# Patient Record
Sex: Female | Born: 1935
Health system: Southern US, Community
[De-identification: ages and names within clinical notes are randomized; demographics above are authoritative.]

## PROBLEM LIST (undated history)

## (undated) DIAGNOSIS — M1990 Unspecified osteoarthritis, unspecified site: Secondary | ICD-10-CM

## (undated) DIAGNOSIS — K5730 Diverticulosis of large intestine without perforation or abscess without bleeding: Secondary | ICD-10-CM

## (undated) DIAGNOSIS — I2510 Atherosclerotic heart disease of native coronary artery without angina pectoris: Secondary | ICD-10-CM

## (undated) DIAGNOSIS — A048 Other specified bacterial intestinal infections: Secondary | ICD-10-CM

## (undated) DIAGNOSIS — I10 Essential (primary) hypertension: Secondary | ICD-10-CM

## (undated) DIAGNOSIS — E538 Deficiency of other specified B group vitamins: Secondary | ICD-10-CM

## (undated) DIAGNOSIS — E785 Hyperlipidemia, unspecified: Secondary | ICD-10-CM

## (undated) DIAGNOSIS — D649 Anemia, unspecified: Secondary | ICD-10-CM

## (undated) DIAGNOSIS — I679 Cerebrovascular disease, unspecified: Secondary | ICD-10-CM

## (undated) HISTORY — DX: Diverticulosis of large intestine without perforation or abscess without bleeding: K57.30

## (undated) HISTORY — DX: Unspecified osteoarthritis, unspecified site: M19.90

## (undated) HISTORY — DX: Cerebrovascular disease, unspecified: I67.9

## (undated) HISTORY — DX: Atherosclerotic heart disease of native coronary artery without angina pectoris: I25.10

## (undated) HISTORY — DX: Other specified bacterial intestinal infections: A04.8

## (undated) HISTORY — DX: Deficiency of other specified B group vitamins: E53.8

## (undated) HISTORY — DX: Anemia, unspecified: D64.9

## (undated) HISTORY — DX: Hyperlipidemia, unspecified: E78.5

## (undated) HISTORY — DX: Essential (primary) hypertension: I10

---

## 1982-05-09 HISTORY — PX: ABDOMINAL HYSTERECTOMY: SHX81

## 1997-11-26 ENCOUNTER — Ambulatory Visit: Admission: RE | Admit: 1997-11-26 | Discharge: 1997-11-26 | Payer: Self-pay | Admitting: Internal Medicine

## 1999-01-06 ENCOUNTER — Other Ambulatory Visit: Admission: RE | Admit: 1999-01-06 | Discharge: 1999-01-06 | Payer: Self-pay | Admitting: Obstetrics and Gynecology

## 1999-11-03 ENCOUNTER — Encounter: Payer: Self-pay | Admitting: Internal Medicine

## 1999-11-03 ENCOUNTER — Ambulatory Visit (HOSPITAL_COMMUNITY): Admission: RE | Admit: 1999-11-03 | Discharge: 1999-11-03 | Payer: Self-pay | Admitting: Internal Medicine

## 2000-12-06 ENCOUNTER — Encounter: Payer: Self-pay | Admitting: Internal Medicine

## 2000-12-06 ENCOUNTER — Ambulatory Visit (HOSPITAL_COMMUNITY): Admission: RE | Admit: 2000-12-06 | Discharge: 2000-12-06 | Payer: Self-pay | Admitting: Internal Medicine

## 2001-12-07 LAB — HM COLONOSCOPY

## 2001-12-19 ENCOUNTER — Ambulatory Visit (HOSPITAL_COMMUNITY): Admission: RE | Admit: 2001-12-19 | Discharge: 2001-12-19 | Payer: Self-pay | Admitting: Gastroenterology

## 2002-04-09 ENCOUNTER — Other Ambulatory Visit: Admission: RE | Admit: 2002-04-09 | Discharge: 2002-04-09 | Payer: Self-pay | Admitting: Obstetrics and Gynecology

## 2003-03-20 ENCOUNTER — Ambulatory Visit (HOSPITAL_COMMUNITY): Admission: RE | Admit: 2003-03-20 | Discharge: 2003-03-20 | Payer: Self-pay | Admitting: Internal Medicine

## 2003-12-20 ENCOUNTER — Emergency Department (HOSPITAL_COMMUNITY): Admission: EM | Admit: 2003-12-20 | Discharge: 2003-12-20 | Payer: Self-pay | Admitting: *Deleted

## 2004-03-26 ENCOUNTER — Ambulatory Visit (HOSPITAL_COMMUNITY): Admission: RE | Admit: 2004-03-26 | Discharge: 2004-03-26 | Payer: Self-pay | Admitting: Internal Medicine

## 2004-04-09 ENCOUNTER — Ambulatory Visit: Payer: Self-pay | Admitting: Internal Medicine

## 2004-08-03 ENCOUNTER — Ambulatory Visit: Payer: Self-pay | Admitting: Internal Medicine

## 2004-08-09 ENCOUNTER — Ambulatory Visit: Payer: Self-pay

## 2004-08-23 ENCOUNTER — Other Ambulatory Visit: Admission: RE | Admit: 2004-08-23 | Discharge: 2004-08-23 | Payer: Self-pay | Admitting: Obstetrics and Gynecology

## 2004-10-11 ENCOUNTER — Ambulatory Visit: Payer: Self-pay | Admitting: Internal Medicine

## 2004-12-03 ENCOUNTER — Ambulatory Visit: Payer: Self-pay | Admitting: Cardiology

## 2004-12-13 ENCOUNTER — Ambulatory Visit: Payer: Self-pay | Admitting: Cardiology

## 2004-12-16 ENCOUNTER — Inpatient Hospital Stay (HOSPITAL_BASED_OUTPATIENT_CLINIC_OR_DEPARTMENT_OTHER): Admission: RE | Admit: 2004-12-16 | Discharge: 2004-12-16 | Payer: Self-pay | Admitting: Cardiology

## 2004-12-16 ENCOUNTER — Ambulatory Visit: Payer: Self-pay | Admitting: Cardiology

## 2004-12-21 ENCOUNTER — Ambulatory Visit: Payer: Self-pay | Admitting: Internal Medicine

## 2005-01-03 ENCOUNTER — Ambulatory Visit: Payer: Self-pay | Admitting: Family Medicine

## 2005-01-14 ENCOUNTER — Ambulatory Visit: Payer: Self-pay | Admitting: Cardiology

## 2005-02-25 ENCOUNTER — Ambulatory Visit: Payer: Self-pay | Admitting: Cardiology

## 2005-03-30 ENCOUNTER — Ambulatory Visit (HOSPITAL_COMMUNITY): Admission: RE | Admit: 2005-03-30 | Discharge: 2005-03-30 | Payer: Self-pay | Admitting: Internal Medicine

## 2005-05-03 ENCOUNTER — Ambulatory Visit: Payer: Self-pay | Admitting: Internal Medicine

## 2005-05-09 HISTORY — PX: REPLACEMENT TOTAL KNEE: SUR1224

## 2005-06-06 ENCOUNTER — Ambulatory Visit: Payer: Self-pay | Admitting: Internal Medicine

## 2005-06-13 ENCOUNTER — Ambulatory Visit: Payer: Self-pay | Admitting: Internal Medicine

## 2005-09-08 ENCOUNTER — Other Ambulatory Visit: Admission: RE | Admit: 2005-09-08 | Discharge: 2005-09-08 | Payer: Self-pay | Admitting: Obstetrics and Gynecology

## 2005-09-22 ENCOUNTER — Ambulatory Visit: Payer: Self-pay | Admitting: Internal Medicine

## 2005-09-28 ENCOUNTER — Encounter: Payer: Self-pay | Admitting: Internal Medicine

## 2005-09-28 ENCOUNTER — Ambulatory Visit: Payer: Self-pay

## 2005-10-04 ENCOUNTER — Ambulatory Visit: Payer: Self-pay | Admitting: Internal Medicine

## 2005-10-10 ENCOUNTER — Inpatient Hospital Stay (HOSPITAL_COMMUNITY): Admission: RE | Admit: 2005-10-10 | Discharge: 2005-10-13 | Payer: Self-pay | Admitting: Orthopedic Surgery

## 2005-10-28 ENCOUNTER — Ambulatory Visit: Payer: Self-pay | Admitting: Internal Medicine

## 2005-11-04 ENCOUNTER — Encounter: Admission: RE | Admit: 2005-11-04 | Discharge: 2005-12-07 | Payer: Self-pay | Admitting: Orthopedic Surgery

## 2006-03-07 ENCOUNTER — Ambulatory Visit: Payer: Self-pay | Admitting: Internal Medicine

## 2006-03-07 DIAGNOSIS — M1990 Unspecified osteoarthritis, unspecified site: Secondary | ICD-10-CM

## 2006-03-07 DIAGNOSIS — I10 Essential (primary) hypertension: Secondary | ICD-10-CM | POA: Insufficient documentation

## 2006-03-07 DIAGNOSIS — K5730 Diverticulosis of large intestine without perforation or abscess without bleeding: Secondary | ICD-10-CM | POA: Insufficient documentation

## 2006-03-07 HISTORY — DX: Unspecified osteoarthritis, unspecified site: M19.90

## 2006-04-06 ENCOUNTER — Ambulatory Visit: Payer: Self-pay | Admitting: Cardiology

## 2006-04-10 ENCOUNTER — Ambulatory Visit (HOSPITAL_COMMUNITY): Admission: RE | Admit: 2006-04-10 | Discharge: 2006-04-10 | Payer: Self-pay | Admitting: Internal Medicine

## 2006-04-24 ENCOUNTER — Encounter: Admission: RE | Admit: 2006-04-24 | Discharge: 2006-04-24 | Payer: Self-pay | Admitting: Internal Medicine

## 2006-05-30 ENCOUNTER — Ambulatory Visit: Payer: Self-pay | Admitting: Internal Medicine

## 2006-07-05 ENCOUNTER — Ambulatory Visit: Payer: Self-pay | Admitting: Internal Medicine

## 2006-09-11 ENCOUNTER — Ambulatory Visit: Payer: Self-pay | Admitting: Internal Medicine

## 2006-09-11 LAB — CONVERTED CEMR LAB
ALT: 12 units/L (ref 0–40)
AST: 22 units/L (ref 0–37)
Albumin: 3.4 g/dL — ABNORMAL LOW (ref 3.5–5.2)
Alkaline Phosphatase: 40 units/L (ref 39–117)
BUN: 13 mg/dL (ref 6–23)
Basophils Absolute: 0 10*3/uL (ref 0.0–0.1)
Basophils Relative: 0.8 % (ref 0.0–1.0)
Bilirubin, Direct: 0.1 mg/dL (ref 0.0–0.3)
CO2: 32 meq/L (ref 19–32)
CRP, High Sensitivity: 4 (ref 0.00–5.00)
Calcium: 9.3 mg/dL (ref 8.4–10.5)
Chloride: 105 meq/L (ref 96–112)
Cholesterol: 203 mg/dL (ref 0–200)
Creatinine, Ser: 0.9 mg/dL (ref 0.4–1.2)
Direct LDL: 124.1 mg/dL
Eosinophils Absolute: 0.1 10*3/uL (ref 0.0–0.6)
Eosinophils Relative: 1.5 % (ref 0.0–5.0)
GFR calc Af Amer: 79 mL/min
GFR calc non Af Amer: 66 mL/min
Glucose, Bld: 91 mg/dL (ref 70–99)
HCT: 34.8 % — ABNORMAL LOW (ref 36.0–46.0)
HDL: 57.9 mg/dL (ref >39.0)
Hemoglobin: 11.9 g/dL — ABNORMAL LOW (ref 12.0–15.0)
Lymphocytes Relative: 38.1 % (ref 12.0–46.0)
MCHC: 34.1 g/dL (ref 30.0–36.0)
MCV: 90.3 fL (ref 78.0–100.0)
Monocytes Absolute: 0.6 10*3/uL (ref 0.2–0.7)
Monocytes Relative: 12 % — ABNORMAL HIGH (ref 3.0–11.0)
Neutro Abs: 2.1 10*3/uL (ref 1.4–7.7)
Neutrophils Relative %: 47.6 % (ref 43.0–77.0)
Platelets: 308 10*3/uL (ref 150–400)
Potassium: 4.1 meq/L (ref 3.5–5.1)
RBC: 3.85 M/uL — ABNORMAL LOW (ref 3.87–5.11)
RDW: 13.9 % (ref 11.5–14.6)
Sodium: 140 meq/L (ref 135–145)
TSH: 1.58 microintl units/mL (ref 0.35–5.50)
Total Bilirubin: 0.6 mg/dL (ref 0.3–1.2)
Total CHOL/HDL Ratio: 3.5
Total Protein: 6.4 g/dL (ref 6.0–8.3)
Triglycerides: 54 mg/dL (ref 0–149)
VLDL: 11 mg/dL (ref 0–40)
WBC: 4.6 10*3/uL (ref 4.5–10.5)

## 2006-09-18 ENCOUNTER — Ambulatory Visit: Payer: Self-pay | Admitting: Internal Medicine

## 2006-11-02 ENCOUNTER — Ambulatory Visit: Payer: Self-pay | Admitting: Internal Medicine

## 2006-11-06 ENCOUNTER — Ambulatory Visit: Payer: Self-pay | Admitting: Cardiology

## 2006-11-06 DIAGNOSIS — I2510 Atherosclerotic heart disease of native coronary artery without angina pectoris: Secondary | ICD-10-CM | POA: Insufficient documentation

## 2006-11-06 LAB — CONVERTED CEMR LAB
AST: 25 units/L (ref 0–37)
Albumin: 3.7 g/dL (ref 3.5–5.2)
Bilirubin, Direct: 0.1 mg/dL (ref 0.0–0.3)
Cholesterol: 195 mg/dL (ref 0–200)
HDL: 63.4 mg/dL (ref >39.0)
Total Bilirubin: 0.6 mg/dL (ref 0.3–1.2)
Total CHOL/HDL Ratio: 3.1
Triglycerides: 58 mg/dL (ref 0–149)

## 2006-12-08 ENCOUNTER — Ambulatory Visit: Payer: Self-pay | Admitting: Internal Medicine

## 2006-12-08 DIAGNOSIS — M542 Cervicalgia: Secondary | ICD-10-CM | POA: Insufficient documentation

## 2007-01-01 ENCOUNTER — Ambulatory Visit: Payer: Self-pay | Admitting: Internal Medicine

## 2007-01-02 ENCOUNTER — Telehealth: Payer: Self-pay | Admitting: Internal Medicine

## 2007-01-06 ENCOUNTER — Encounter: Admission: RE | Admit: 2007-01-06 | Discharge: 2007-01-06 | Payer: Self-pay | Admitting: Internal Medicine

## 2007-04-12 ENCOUNTER — Ambulatory Visit (HOSPITAL_COMMUNITY): Admission: RE | Admit: 2007-04-12 | Discharge: 2007-04-12 | Payer: Self-pay | Admitting: Internal Medicine

## 2007-05-01 ENCOUNTER — Ambulatory Visit: Payer: Self-pay | Admitting: Cardiology

## 2007-05-01 LAB — CONVERTED CEMR LAB
ALT: 19 U/L
AST: 24 U/L
Albumin: 3.7 g/dL
Alkaline Phosphatase: 54 U/L
BUN: 9 mg/dL
Bilirubin, Direct: 0.1 mg/dL
CO2: 34 meq/L — ABNORMAL HIGH
Calcium: 9.7 mg/dL
Chloride: 98 meq/L
Cholesterol: 221 mg/dL
Creatinine, Ser: 0.9 mg/dL
Direct LDL: 138.1 mg/dL
GFR calc Af Amer: 79 mL/min
GFR calc non Af Amer: 65 mL/min
Glucose, Bld: 87 mg/dL
HDL: 62.4 mg/dL
Potassium: 3.6 meq/L
Sodium: 138 meq/L
Total Bilirubin: 0.7 mg/dL
Total CHOL/HDL Ratio: 3.5
Total Protein: 7 g/dL
Triglycerides: 82 mg/dL
VLDL: 16 mg/dL

## 2007-05-09 ENCOUNTER — Ambulatory Visit: Payer: Self-pay

## 2007-05-10 LAB — HM PAP SMEAR: HM Pap smear: NORMAL

## 2007-10-29 ENCOUNTER — Ambulatory Visit: Payer: Self-pay | Admitting: Family Medicine

## 2007-11-06 ENCOUNTER — Ambulatory Visit: Payer: Self-pay | Admitting: Internal Medicine

## 2007-11-06 DIAGNOSIS — M79609 Pain in unspecified limb: Secondary | ICD-10-CM | POA: Insufficient documentation

## 2007-11-07 ENCOUNTER — Encounter: Payer: Self-pay | Admitting: Internal Medicine

## 2007-11-07 ENCOUNTER — Ambulatory Visit: Payer: Self-pay

## 2007-11-15 ENCOUNTER — Ambulatory Visit: Payer: Self-pay | Admitting: Internal Medicine

## 2007-11-22 ENCOUNTER — Telehealth: Payer: Self-pay | Admitting: Internal Medicine

## 2008-01-08 ENCOUNTER — Ambulatory Visit: Payer: Self-pay | Admitting: Internal Medicine

## 2008-03-23 LAB — CONVERTED CEMR LAB: LDL Cholesterol: 65 mg/dL

## 2008-03-24 ENCOUNTER — Ambulatory Visit: Payer: Self-pay | Admitting: Internal Medicine

## 2008-04-23 ENCOUNTER — Encounter: Admission: RE | Admit: 2008-04-23 | Discharge: 2008-04-23 | Payer: Self-pay | Admitting: Internal Medicine

## 2008-04-23 LAB — HM MAMMOGRAPHY

## 2008-05-09 HISTORY — PX: TOTAL HIP ARTHROPLASTY: SHX124

## 2008-05-12 ENCOUNTER — Ambulatory Visit: Payer: Self-pay | Admitting: Cardiology

## 2008-05-14 ENCOUNTER — Ambulatory Visit: Payer: Self-pay | Admitting: Cardiology

## 2008-05-14 ENCOUNTER — Encounter: Payer: Self-pay | Admitting: Internal Medicine

## 2008-05-14 ENCOUNTER — Ambulatory Visit: Payer: Self-pay

## 2008-05-14 LAB — CONVERTED CEMR LAB
Albumin: 3.7 g/dL (ref 3.5–5.2)
Bilirubin, Direct: 0.1 mg/dL (ref 0.0–0.3)
Chloride: 100 meq/L (ref 96–112)
Cholesterol: 143 mg/dL (ref 0–200)
GFR calc Af Amer: 70 mL/min
GFR calc non Af Amer: 58 mL/min
Glucose, Bld: 108 mg/dL — ABNORMAL HIGH (ref 70–99)
LDL Cholesterol: 76 mg/dL (ref 0–99)
Potassium: 5 meq/L (ref 3.5–5.1)
Sodium: 138 meq/L (ref 135–145)
Total Bilirubin: 0.8 mg/dL (ref 0.3–1.2)
Total CHOL/HDL Ratio: 2.4

## 2008-06-27 ENCOUNTER — Ambulatory Visit: Payer: Self-pay | Admitting: Internal Medicine

## 2008-06-27 DIAGNOSIS — E785 Hyperlipidemia, unspecified: Secondary | ICD-10-CM | POA: Insufficient documentation

## 2009-01-27 ENCOUNTER — Encounter: Payer: Self-pay | Admitting: Internal Medicine

## 2009-02-11 ENCOUNTER — Telehealth (INDEPENDENT_AMBULATORY_CARE_PROVIDER_SITE_OTHER): Payer: Self-pay | Admitting: *Deleted

## 2009-02-16 ENCOUNTER — Inpatient Hospital Stay (HOSPITAL_COMMUNITY): Admission: RE | Admit: 2009-02-16 | Discharge: 2009-02-19 | Payer: Self-pay | Admitting: Orthopedic Surgery

## 2009-05-26 DIAGNOSIS — R079 Chest pain, unspecified: Secondary | ICD-10-CM | POA: Insufficient documentation

## 2009-05-27 ENCOUNTER — Encounter: Payer: Self-pay | Admitting: Cardiology

## 2009-05-27 DIAGNOSIS — I739 Peripheral vascular disease, unspecified: Secondary | ICD-10-CM

## 2009-05-27 DIAGNOSIS — I779 Disorder of arteries and arterioles, unspecified: Secondary | ICD-10-CM | POA: Insufficient documentation

## 2009-05-28 ENCOUNTER — Ambulatory Visit: Payer: Self-pay

## 2009-05-28 ENCOUNTER — Ambulatory Visit: Payer: Self-pay | Admitting: Cardiology

## 2009-07-09 ENCOUNTER — Ambulatory Visit: Payer: Self-pay | Admitting: Cardiology

## 2009-07-09 ENCOUNTER — Encounter (INDEPENDENT_AMBULATORY_CARE_PROVIDER_SITE_OTHER): Payer: Self-pay | Admitting: *Deleted

## 2009-07-09 LAB — CONVERTED CEMR LAB
ALT: 13 units/L (ref 0–35)
AST: 22 units/L (ref 0–37)
Albumin: 3.6 g/dL (ref 3.5–5.2)
Alkaline Phosphatase: 54 units/L (ref 39–117)
Bilirubin, Direct: 0.1 mg/dL (ref 0.0–0.3)
Cholesterol: 166 mg/dL (ref 0–200)
HDL: 68.5 mg/dL (ref >39.00)
Total Bilirubin: 0.4 mg/dL (ref 0.3–1.2)
Total Protein: 7.3 g/dL (ref 6.0–8.3)

## 2009-09-08 ENCOUNTER — Ambulatory Visit: Payer: Self-pay | Admitting: Internal Medicine

## 2009-09-08 DIAGNOSIS — M25519 Pain in unspecified shoulder: Secondary | ICD-10-CM | POA: Insufficient documentation

## 2010-05-12 ENCOUNTER — Ambulatory Visit
Admission: RE | Admit: 2010-05-12 | Discharge: 2010-05-12 | Payer: Self-pay | Source: Home / Self Care | Attending: Cardiology | Admitting: Cardiology

## 2010-05-12 ENCOUNTER — Encounter: Payer: Self-pay | Admitting: Cardiology

## 2010-05-12 ENCOUNTER — Other Ambulatory Visit: Payer: Self-pay | Admitting: Cardiology

## 2010-05-12 LAB — HEPATIC FUNCTION PANEL
ALT: 20 U/L (ref 0–35)
AST: 32 U/L (ref 0–37)
Albumin: 3.6 g/dL (ref 3.5–5.2)
Alkaline Phosphatase: 47 U/L (ref 39–117)
Bilirubin, Direct: 0.1 mg/dL (ref 0.0–0.3)
Total Bilirubin: 0.6 mg/dL (ref 0.3–1.2)
Total Protein: 6.8 g/dL (ref 6.0–8.3)

## 2010-05-12 LAB — BASIC METABOLIC PANEL
BUN: 20 mg/dL (ref 6–23)
CO2: 32 mEq/L (ref 19–32)
Calcium: 9.5 mg/dL (ref 8.4–10.5)
Chloride: 101 mEq/L (ref 96–112)
Creatinine, Ser: 1 mg/dL (ref 0.4–1.2)
GFR: 71.96 mL/min (ref >60.00)
Glucose, Bld: 77 mg/dL (ref 70–99)
Potassium: 4 mEq/L (ref 3.5–5.1)
Sodium: 138 mEq/L (ref 135–145)

## 2010-05-12 LAB — LIPID PANEL
Cholesterol: 152 mg/dL (ref 0–200)
HDL: 55.3 mg/dL (ref >39.00)
LDL Cholesterol: 87 mg/dL (ref 0–99)
Total CHOL/HDL Ratio: 3
Triglycerides: 47 mg/dL (ref 0.0–149.0)
VLDL: 9.4 mg/dL (ref 0.0–40.0)

## 2010-05-13 ENCOUNTER — Encounter (INDEPENDENT_AMBULATORY_CARE_PROVIDER_SITE_OTHER): Payer: Self-pay | Admitting: *Deleted

## 2010-05-27 ENCOUNTER — Encounter: Payer: Self-pay | Admitting: Cardiology

## 2010-05-30 ENCOUNTER — Encounter: Payer: Self-pay | Admitting: Internal Medicine

## 2010-06-01 ENCOUNTER — Other Ambulatory Visit: Payer: Self-pay | Admitting: Obstetrics and Gynecology

## 2010-06-01 DIAGNOSIS — Z1239 Encounter for other screening for malignant neoplasm of breast: Secondary | ICD-10-CM

## 2010-06-06 LAB — CONVERTED CEMR LAB
Alkaline Phosphatase: 46 units/L (ref 39–117)
BUN: 8 mg/dL (ref 6–23)
Bilirubin, Direct: 0 mg/dL (ref 0.0–0.3)
CO2: 28 meq/L (ref 19–32)
Calcium: 9.7 mg/dL (ref 8.4–10.5)
Direct LDL: 138.7 mg/dL
Total Bilirubin: 0.7 mg/dL (ref 0.3–1.2)
Total CHOL/HDL Ratio: 3
Triglycerides: 62 mg/dL (ref 0.0–149.0)
VLDL: 12.4 mg/dL (ref 0.0–40.0)

## 2010-06-10 NOTE — Letter (Signed)
Summary: Custom - Lipid  [facility], Main Office  [address]   [place], [place] [address]   Phone: [phone]  Fax: [phone]     July 09, 2009 MRN: [id number]   BREAWNA MONTENEGRO [address] [place], [place]  [address]   Dear Ms. Berwanger,  We have reviewed your cholesterol results.  They are as follows:     Total Cholesterol:    166 (Desirable: less than 200)       HDL  Cholesterol:     68.50  (Desirable: greater than 40 for men and 50 for women)       LDL Cholesterol:       88  (Desirable: less than 100 for low risk and less than 70 for moderate to high risk)       Triglycerides:       50.0  (Desirable: less than 150)  Our recommendations include:These numbers look good. Continue on the same medicine. Liver function is normal. Take care, Dr. Darel Hong.    Call our office at the number listed above if you have any questions.  Lowering your LDL cholesterol is important, but it is only one of a large number of "risk factors" that may indicate that you are at risk for heart disease, stroke or other complications of hardening of the arteries.  Other risk factors include:   A.  Cigarette Smoking* B.  High Blood Pressure* C.  Obesity* D.   Low HDL Cholesterol (see yours above)* E.   Diabetes Mellitus (higher risk if your is uncontrolled) F.  Family history of premature heart disease G.  Previous history of stroke or cardiovascular disease    *These are risk factors YOU HAVE CONTROL OVER.  For more information, visit .  There is now evidence that lowering the TOTAL CHOLESTEROL AND LDL CHOLESTEROL can reduce the risk of heart disease.  The American Heart Association recommends the following guidelines for the treatment of elevated cholesterol:  1.  If there is now current heart disease and less than two risk factors, TOTAL CHOLESTEROL should be less than 200 and LDL CHOLESTEROL should be less than 100. 2.  If there is current heart disease  or two or more risk factors, TOTAL CHOLESTEROL should be less than 200 and LDL CHOLESTEROL should be less than 70.  A diet low in cholesterol, saturated fat, and calories is the cornerstone of treatment for elevated cholesterol.  Cessation of smoking and exercise are also important in the management of elevated cholesterol and preventing vascular disease.  Studies have shown that 30 to 60 minutes of physical activity most days can help lower blood pressure, lower cholesterol, and keep your weight at a healthy level.  Drug therapy is used when cholesterol levels do not respond to therapeutic lifestyle changes (smoking cessation, diet, and exercise) and remains unacceptably high.  If medication is started, it is important to have you levels checked periodically to evaluate the need for further treatment options.  Thank you,    [employer] Team

## 2010-06-10 NOTE — Miscellaneous (Signed)
Summary: Orders Update  Clinical Lists Changes  Orders: Added new Test order of Carotid Duplex (Carotid Duplex) - Signed 

## 2010-06-10 NOTE — Letter (Signed)
Summary: Custom - Lipid   [facility], Main Office  [address]   [place], [place] [address]   Phone: [phone]  Fax: [phone]     May 13, 2010 MRN: [id number]   Felicia Cross [address] [place], [place]  [address]   Dear Ms. Kinkade,  We have reviewed your cholesterol results.  They are as follows:     Total Cholesterol:    152 (Desirable: less than 200)       HDL  Cholesterol:     55.30  (Desirable: greater than 40 for men and 50 for women)       LDL Cholesterol:       87  (Desirable: less than 100 for low risk and less than 70 for moderate to high risk)       Triglycerides:       47.0  (Desirable: less than 150)  Our recommendations include:These numbers look good. Continue on the same medicine. Sodium, potassium, kidney and  Liver function are normal. Take care, Dr. Darel Hong.    Call our office at the number listed above if you have any questions.  Lowering your LDL cholesterol is important, but it is only one of a large number of "risk factors" that may indicate that you are at risk for heart disease, stroke or other complications of hardening of the arteries.  Other risk factors include:   A.  Cigarette Smoking* B.  High Blood Pressure* C.  Obesity* D.   Low HDL Cholesterol (see yours above)* E.   Diabetes Mellitus (higher risk if your is uncontrolled) F.  Family history of premature heart disease G.  Previous history of stroke or cardiovascular disease    *These are risk factors YOU HAVE CONTROL OVER.  For more information, visit .  There is now evidence that lowering the TOTAL CHOLESTEROL AND LDL CHOLESTEROL can reduce the risk of heart disease.  The American Heart Association recommends the following guidelines for the treatment of elevated cholesterol:  1.  If there is now current heart disease and less than two risk factors, TOTAL CHOLESTEROL should be less than 200 and LDL CHOLESTEROL should be less than 100. 2.   If there is current heart disease or two or more risk factors, TOTAL CHOLESTEROL should be less than 200 and LDL CHOLESTEROL should be less than 70.  A diet low in cholesterol, saturated fat, and calories is the cornerstone of treatment for elevated cholesterol.  Cessation of smoking and exercise are also important in the management of elevated cholesterol and preventing vascular disease.  Studies have shown that 30 to 60 minutes of physical activity most days can help lower blood pressure, lower cholesterol, and keep your weight at a healthy level.  Drug therapy is used when cholesterol levels do not respond to therapeutic lifestyle changes (smoking cessation, diet, and exercise) and remains unacceptably high.  If medication is started, it is important to have you levels checked periodically to evaluate the need for further treatment options.  Thank you,    [employer] Team

## 2010-06-10 NOTE — Assessment & Plan Note (Signed)
Summary: BRUSITIS IN RIGHT SHOULDER//SLM   Vital Signs:  Patient profile:   77 year old female Weight:      179 pounds Temp:     98.3 degrees F oral Pulse rate:   72 / minute Pulse rhythm:   regular Resp:     12 per minute BP sitting:   150 / 88  (left arm) Cuff size:   regular  Vitals Entered By: Gladis Riffle, RN (Sep 08, 2009 12:51 PM)  Procedure Note Last Tetanus: Td (05/09/2004)  Injections: Duration of symptoms: months  Procedure # 1: joint aspiration & injection    Location: right shoulder    Technique: 24 g needle    Medication: 40 mg depomedrol    Anesthesia: 1% lidocaine w/o epinephrine    Comment: verbal consent  CC: c/o bursitis right shoulder x 1 month Is Patient Diabetic? No   CC:  c/o bursitis right shoulder x 1 month.  History of Present Illness: Right shoulder pain for 1-2 months no injury OTC meds with only temporary results no swelling, no rash, no erythema even minimal activity worsens pain  All other systems reviewed and were negative   Preventive Screening-Counseling & Management  Alcohol-Tobacco     Smoking Status: quit  Current Medications (verified): 1)  Aspir-81 81 Mg Tbec (Aspirin) .... Take 1 Tablet By Mouth Once A Day 2)  Lisinopril-Hydrochlorothiazide 20-12.5 Mg Tabs (Lisinopril-Hydrochlorothiazide) .... 2 By Mouth Once Daily 3)  Premarin 0.625 Mg Tabs (Estrogens Conjugated) .... Take 1 Tablet By Mouth Once A Day 4)  Metoprolol Succinate 50 Mg Xr24h-Tab (Metoprolol Succinate) .... Take 1 Tablet By Mouth Once A Day 5)  Simvastatin 20 Mg Tabs (Simvastatin) .... Take 1 Tablet By Mouth At Bedtime 6)  Multivitamins   Tabs (Multiple Vitamin) .... Once Daily 7)  Potassium Chloride Cr 10 Meq Cr-Tabs (Potassium Chloride) .... Once Daily 8)  Ferrous Sulfate 325 (65 Fe) Mg  Tabs (Ferrous Sulfate) .Marland Kitchen.. 1 Tabpo Once Daily  Allergies: 1)  ! Codeine 2)  ! Hydrocodone  Past History:  Past Medical History: Last updated:  05/28/2009 Diverticulosis, colon Hypertension osteoarthritis, knee Coronary artery disease (Minor plaquing on previous catheterization) Hyperlipidemia Mild cerebrovascular disease  Past Surgical History: Last updated: 03/07/2006 Hysterectomy Total knee replacement left  Family History: Last updated: 05/26/2009  Brother and sister had a history of a heart attack.  Her  father had a history of hypertension.  Mom had a history of diabetes and dad  also had a history of kidney disease.  Social History: Last updated: 09/08/2009   She does have a 10 pack year history of cigarette smoking  which she quit 25-30 years ago.  She drinks about 1-2 glasses of wine a  week.  She does not use any drugs.  She is married and lives with her  husband in a two-story house.  She has four children, three daughters and  one son..    Risk Factors: Exercise: no (03/07/2006)  Risk Factors: Smoking Status: quit (09/08/2009)  Social History:   She does have a 10 pack year history of cigarette smoking  which she quit 25-30 years ago.  She drinks about 1-2 glasses of wine a  week.  She does not use any drugs.  She is married and lives with her  husband in a two-story house.  She has four children, three daughters and  one son.Marland Kitchen    Physical Exam  General:  alert and well-developed.   Head:  normocephalic and atraumatic.  Eyes:  pupils equal and pupils round.   Msk:  decreased internal rotation right shoulder strength normal   Impression & Recommendations:  Problem # 1:  SHOULDER PAIN, RIGHT (ICD-719.41) unclear etiology suspect RTC tendonitis discussed options verbal consent for injection and PT Her updated medication list for this problem includes:    Aspir-81 81 Mg Tbec (Aspirin) .Marland Kitchen... Take 1 tablet by mouth once a day  Orders: Joint Aspirate / Injection, Large (20610) Depo- Medrol 40mg  (J1030)  Complete Medication List: 1)  Aspir-81 81 Mg Tbec (Aspirin) .... Take 1 tablet by  mouth once a day 2)  Lisinopril-hydrochlorothiazide 20-12.5 Mg Tabs (Lisinopril-hydrochlorothiazide) .... 2 by mouth once daily 3)  Premarin 0.625 Mg Tabs (Estrogens conjugated) .... Take 1 tablet by mouth once a day 4)  Metoprolol Succinate 50 Mg Xr24h-tab (Metoprolol succinate) .... Take 1 tablet by mouth once a day 5)  Multivitamins Tabs (Multiple vitamin) .... Once daily 6)  Potassium Chloride Cr 10 Meq Cr-tabs (Potassium chloride) .... Once daily 7)  Ferrous Sulfate 325 (65 Fe) Mg Tabs (Ferrous sulfate) .Marland Kitchen.. 1 tabpo once daily 8)  Simvastatin 40 Mg Tabs (Simvastatin) .... Take 1 tablet by mouth at bedtime Prescriptions: LISINOPRIL-HYDROCHLOROTHIAZIDE 20-12.5 MG TABS (LISINOPRIL-HYDROCHLOROTHIAZIDE) 2 by mouth once daily  #180 x 3   Entered by:   Gladis Riffle, RN   Authorized by:   Birdie Sons MD   Signed by:   Gladis Riffle, RN on 09/09/2009   Method used:   Electronically to        [facility]* (retail)       [address]       [address] Shipping/mailing       [place], [place]  [address]       Ph: [phone]       Fax: [phone]   RxID:   [id number]

## 2010-06-10 NOTE — Assessment & Plan Note (Signed)
Summary: per check out/sf    CC:  sob.  History of Present Illness: Felicia Cross is a pleasant female who has a history of hypertension, as well as hyperlipidemia.  She had a previous catheterization in August 2006, this showed minor disease in the diagonal and distal LAD.  We also performed carotid Dopplers in Jan 2011, that showed 0-39% stenosis. F/U not deemed necessary as this had been stable over serial exams. Myoview on May 14, 2008; Ejection fraction was 66% and there was no ischemia or infarction. Since I last saw her in Jan 2011  the patient denies any dyspnea on exertion, orthopnea, PND, pedal edema, palpitations, syncope or chest pain.   Current Medications (verified): 1)  Aspir-81 81 Mg Tbec (Aspirin) .... Take 1 Tablet By Mouth Once A Day 2)  Lisinopril-Hydrochlorothiazide 20-12.5 Mg Tabs (Lisinopril-Hydrochlorothiazide) .... 2 By Mouth Once Daily 3)  Metoprolol Succinate 50 Mg Xr24h-Tab (Metoprolol Succinate) .... Take 1 Tablet By Mouth Once A Day 4)  Multivitamins   Tabs (Multiple Vitamin) .... Once Daily 5)  Potassium Chloride Cr 10 Meq Cr-Tabs (Potassium Chloride) .... Once Daily 6)  Ferrous Sulfate 325 (65 Fe) Mg  Tabs (Ferrous Sulfate) .Marland Kitchen.. 1 Tabpo Once Daily 7)  Simvastatin 40 Mg Tabs (Simvastatin) .... Take 1 Tablet By Mouth At Bedtime  Allergies: 1)  ! Codeine 2)  ! Hydrocodone  Past History:  Past Medical History: Reviewed history from 05/28/2009 and no changes required. Diverticulosis, colon Hypertension osteoarthritis, knee Coronary artery disease (Minor plaquing on previous catheterization) Hyperlipidemia Mild cerebrovascular disease  Past Surgical History: Reviewed history from 03/07/2006 and no changes required. Hysterectomy Total knee replacement left  Social History: Reviewed history from 09/08/2009 and no changes required.   She does have a 10 pack year history of cigarette smoking  which she quit 25-30 years ago.  She drinks about 1-2 glasses  of wine a  week.  She does not use any drugs.  She is married and lives with her  husband in a two-story house.  She has four children, three daughters and  one son..    Review of Systems       no fevers or chills, productive cough, hemoptysis, dysphasia, odynophagia, melena, hematochezia, dysuria, hematuria, rash, seizure activity, orthopnea, PND, pedal edema, claudication. Remaining systems are negative.   Vital Signs:  Patient profile:   77 year old female Height:      63 inches Weight:      185 pounds BMI:     32.89 Pulse rate:   75 / minute Resp:     14 per minute BP sitting:   163 / 97  (left arm)  Vitals Entered By: Kem Parkinson (May 12, 2010 9:24 AM)  Physical Exam  General:  Well-developed well-nourished in no acute distress.  Skin is warm and dry.  HEENT is normal.  Neck is supple. No thyromegaly.  Chest is clear to auscultation with normal expansion.  Cardiovascular exam is regular rate and rhythm.  Abdominal exam nontender or distended. No masses palpated. Extremities show no edema. neuro grossly intact    EKG  Procedure date:  05/12/2010  Findings:      Sinus rhythm at a rate of 75. Left axis deviation. Left ventricular hypertrophy. No ST changes.  Impression & Recommendations:  Problem # 1:  CAROTID ARTERY DISEASE (ICD-433.10) Continue aspirin and statin. Followup carotid Dopplers this month. Her updated medication list for this problem includes:    Aspir-81 81 Mg Tbec (Aspirin) .Marland Kitchen... Take  1 tablet by mouth once a day  Problem # 2:  CORONARY ARTERY DISEASE (ICD-414.00) Mild coronary disease previously. Continue aspirin, statin and beta blocker. Her updated medication list for this problem includes:    Aspir-81 81 Mg Tbec (Aspirin) .Marland Kitchen... Take 1 tablet by mouth once a day    Lisinopril-hydrochlorothiazide 20-12.5 Mg Tabs (Lisinopril-hydrochlorothiazide) .Marland Kitchen... 2 by mouth once daily    Metoprolol Succinate 50 Mg Xr24h-tab (Metoprolol succinate)  .Marland Kitchen... Take 1 tablet by mouth once a day  Her updated medication list for this problem includes:    Aspir-81 81 Mg Tbec (Aspirin) .Marland Kitchen... Take 1 tablet by mouth once a day    Lisinopril-hydrochlorothiazide 20-12.5 Mg Tabs (Lisinopril-hydrochlorothiazide) .Marland Kitchen... 2 by mouth once daily    Metoprolol Succinate 50 Mg Xr24h-tab (Metoprolol succinate) .Marland Kitchen... Take 1 tablet by mouth once a day  Problem # 3:  HYPERLIPIDEMIA (ICD-272.4) Continue statin. Check lipids and liver. Her updated medication list for this problem includes:    Simvastatin 40 Mg Tabs (Simvastatin) .Marland Kitchen... Take 1 tablet by mouth at bedtime  Orders: TLB-Lipid Panel (80061-LIPID) TLB-Hepatic/Liver Function Pnl (80076-HEPATIC)  Problem # 4:  HYPERTENSION (ICD-401.9) Her blood pressure is elevated. However she checks it  routinely and it typically is typically 130/80. She will follow this and I will increase her beta blocker as needed. Her updated medication list for this problem includes:    Aspir-81 81 Mg Tbec (Aspirin) .Marland Kitchen... Take 1 tablet by mouth once a day    Lisinopril-hydrochlorothiazide 20-12.5 Mg Tabs (Lisinopril-hydrochlorothiazide) .Marland Kitchen... 2 by mouth once daily    Metoprolol Succinate 50 Mg Xr24h-tab (Metoprolol succinate) .Marland Kitchen... Take 1 tablet by mouth once a day  Orders: TLB-BMP (Basic Metabolic Panel-BMET) (80048-METABOL)  Patient Instructions: 1)  Your physician wants you to follow-up in: ONE YEAR  You will receive a reminder letter in the mail two months in advance. If you don't receive a letter, please call our office to schedule the follow-up appointment. Prescriptions: SIMVASTATIN 40 MG TABS (SIMVASTATIN) Take 1 tablet by mouth at bedtime  #90 x 3   Entered by:   Deliah Goody, RN   Authorized by:   Ferman Hamming, MD, [facility]   Signed by:   Deliah Goody, RN on 05/12/2010   Method used:   Electronically to        [facility]  [phone]* (retail)       [address]       [place], [place]  [address]       Ph: [phone] or [phone]       Fax: [phone]   RxID:   [id number] METOPROLOL SUCCINATE 50 MG XR24H-TAB (METOPROLOL SUCCINATE) Take 1 tablet by mouth once a day  #90 x 3   Entered by:   Deliah Goody, RN   Authorized by:   Ferman Hamming, MD, [facility]   Signed by:   Deliah Goody, RN on 05/12/2010   Method used:   Electronically to        [facility]  [phone]* (retail)       [address]       [place], [place]  [address]       Ph: [phone] or [phone]       Fax: [phone]   RxID:   [id number]

## 2010-06-10 NOTE — Assessment & Plan Note (Signed)
Summary: per check out/sf  Medications Added FERROUS SULFATE 325 (65 FE) MG  TABS (FERROUS SULFATE) 1 tabpo once daily        History of Present Illness: Felicia Cross is a pleasant female who has a history of hypertension, as well as hyperlipidemia.  She had a previous catheterization in August 2006, this showed minor disease in the diagonal and distal LAD.  We also performed carotid Dopplers in Jan 2010, that showed 0-39% stenosis. F/U recommended in one year. I last saw her in January of 2010 and we scheduled a Myoview. This was in response to an episode of chest pain. It was performed on May 14, 2008. Ejection fraction was 66% and there was no ischemia or infarction. Since I last saw her the patient denies any dyspnea on exertion, orthopnea, PND, pedal edema, palpitations, syncope or chest pain.   Current Medications (verified): 1)  Aspir-81 81 Mg Tbec (Aspirin) .... Take 1 Tablet By Mouth Once A Day 2)  Lisinopril-Hydrochlorothiazide 20-12.5 Mg Tabs (Lisinopril-Hydrochlorothiazide) .... 2 By Mouth Once Daily 3)  Premarin 0.625 Mg Tabs (Estrogens Conjugated) .... Take 1 Tablet By Mouth Once A Day 4)  Metoprolol Succinate 50 Mg Xr24h-Tab (Metoprolol Succinate) .... Take 1 Tablet By Mouth Once A Day 5)  Simvastatin 40 Mg  Tabs (Simvastatin) .... One By Mouth Daily 6)  Multivitamins   Tabs (Multiple Vitamin) .... Once Daily 7)  Potassium Chloride Cr 10 Meq Cr-Tabs (Potassium Chloride) .... Once Daily 8)  Ferrous Sulfate 325 (65 Fe) Mg  Tabs (Ferrous Sulfate) .Marland Kitchen.. 1 Tabpo Once Daily  Allergies: 1)  ! Codeine 2)  ! Hydrocodone  Past History:  Family History: Last updated: 03/07/2006 Father-nephritis htn Mother--DM2 92  Past Medical History: Diverticulosis, colon Hypertension osteoarthritis, knee Coronary artery disease (Minor plaquing on previous catheterization) Hyperlipidemia Mild cerebrovascular disease  Past Surgical History: Reviewed history from 03/07/2006 and no  changes required. Hysterectomy Total knee replacement left  Social History: Reviewed history from 03/07/2006 and no changes required. Occupation: Former Smoker Regular exercise-no  Review of Systems       Mild arthralgias but no fevers or chills, productive cough, hemoptysis, dysphasia, odynophagia, melena, hematochezia, dysuria, hematuria, rash, seizure activity, orthopnea, PND, pedal edema, claudication. Remaining systems are negative.   Vital Signs:  Patient profile:   77 year old female Height:      63 inches Weight:      178 pounds BMI:     31.65 Pulse rate:   68 / minute Resp:     12 per minute BP sitting:   160 / 85  (left arm)  Vitals Entered By: Kem Parkinson (May 28, 2009 10:42 AM)  Physical Exam  General:  Well-developed well-nourished in no acute distress.  Skin is warm and dry.  HEENT is normal.  Neck is supple. No thyromegaly.  Chest is clear to auscultation with normal expansion.  Cardiovascular exam is regular rate and rhythm.  Abdominal exam nontender or distended. No masses palpated. Extremities show no edema. neuro grossly intact    Impression & Recommendations:  Problem # 1:  CAROTID ARTERY DISEASE (ICD-433.10)  Continue aspirin and statin. Followup carotid Dopplers were performed today. We will await those results. Her updated medication list for this problem includes:    Aspir-81 81 Mg Tbec (Aspirin) .Marland Kitchen... Take 1 tablet by mouth once a day  Her updated medication list for this problem includes:    Aspir-81 81 Mg Tbec (Aspirin) .Marland Kitchen... Take 1 tablet by mouth once a day  Problem # 2:  CORONARY ARTERY DISEASE (ICD-414.00)  Previous minor plaquing. No symptoms. Continue aspirin and statin. Her updated medication list for this problem includes:    Aspir-81 81 Mg Tbec (Aspirin) .Marland Kitchen... Take 1 tablet by mouth once a day    Lisinopril-hydrochlorothiazide 20-12.5 Mg Tabs (Lisinopril-hydrochlorothiazide) .Marland Kitchen... 2 by mouth once daily     Metoprolol Succinate 50 Mg Xr24h-tab (Metoprolol succinate) .Marland Kitchen... Take 1 tablet by mouth once a day  Orders: TLB-Lipid Panel (80061-LIPID) TLB-BMP (Basic Metabolic Panel-BMET) (80048-METABOL) TLB-Hepatic/Liver Function Pnl (80076-HEPATIC)  Her updated medication list for this problem includes:    Aspir-81 81 Mg Tbec (Aspirin) .Marland Kitchen... Take 1 tablet by mouth once a day    Lisinopril-hydrochlorothiazide 20-12.5 Mg Tabs (Lisinopril-hydrochlorothiazide) .Marland Kitchen... 2 by mouth once daily    Metoprolol Succinate 50 Mg Xr24h-tab (Metoprolol succinate) .Marland Kitchen... Take 1 tablet by mouth once a day  Problem # 3:  HYPERLIPIDEMIA (ICD-272.4) Continue statin. Check lipids and liver. Her updated medication list for this problem includes:    Simvastatin 40 Mg Tabs (Simvastatin) ..... One by mouth daily  Orders: TLB-Lipid Panel (80061-LIPID) TLB-BMP (Basic Metabolic Panel-BMET) (80048-METABOL)  Her updated medication list for this problem includes:    Simvastatin 40 Mg Tabs (Simvastatin) ..... One by mouth daily  Problem # 4:  HYPERTENSION (ICD-401.9) Blood pressure mildly elevated but she states her systolic typically runs in the 135-140 range. She will follow this and we will add additional medications as needed. Her updated medication list for this problem includes:    Aspir-81 81 Mg Tbec (Aspirin) .Marland Kitchen... Take 1 tablet by mouth once a day    Lisinopril-hydrochlorothiazide 20-12.5 Mg Tabs (Lisinopril-hydrochlorothiazide) .Marland Kitchen... 2 by mouth once daily    Metoprolol Succinate 50 Mg Xr24h-tab (Metoprolol succinate) .Marland Kitchen... Take 1 tablet by mouth once a day  Orders: TLB-BMP (Basic Metabolic Panel-BMET) (80048-METABOL) TLB-Hepatic/Liver Function Pnl (80076-HEPATIC)  Patient Instructions: 1)  Your physician recommends that you schedule a follow-up appointment in: 12 months 2)  Your physician recommends that you return for lab work ZO:XWRUE Lipid panel LFT, BMET

## 2010-06-10 NOTE — Miscellaneous (Signed)
Summary: Orders Update  Clinical Lists Changes  Problems: Added new problem of CAROTID ARTERY DISEASE (ICD-433.10) Orders: Added new Test order of Carotid Duplex (Carotid Duplex) - Signed 

## 2010-06-11 ENCOUNTER — Other Ambulatory Visit: Payer: Self-pay | Admitting: Obstetrics and Gynecology

## 2010-06-11 ENCOUNTER — Encounter (INDEPENDENT_AMBULATORY_CARE_PROVIDER_SITE_OTHER): Payer: MEDICARE

## 2010-06-11 ENCOUNTER — Ambulatory Visit: Admit: 2010-06-11 | Payer: Self-pay

## 2010-06-11 ENCOUNTER — Encounter: Payer: Self-pay | Admitting: Cardiology

## 2010-06-11 ENCOUNTER — Ambulatory Visit
Admission: RE | Admit: 2010-06-11 | Discharge: 2010-06-11 | Disposition: A | Discharge status: Home / Self Care | Payer: Commercial Managed Care - PPO | Source: Ambulatory Visit | Attending: Obstetrics and Gynecology | Admitting: Obstetrics and Gynecology

## 2010-06-11 DIAGNOSIS — Z1239 Encounter for other screening for malignant neoplasm of breast: Secondary | ICD-10-CM

## 2010-06-11 DIAGNOSIS — I6529 Occlusion and stenosis of unspecified carotid artery: Secondary | ICD-10-CM

## 2010-08-12 LAB — CBC
HCT: 25.4 % — ABNORMAL LOW (ref 36.0–46.0)
HCT: 36.9 % (ref 36.0–46.0)
Hemoglobin: 12.3 g/dL (ref 12.0–15.0)
Hemoglobin: 8.5 g/dL — ABNORMAL LOW (ref 12.0–15.0)
Hemoglobin: 8.9 g/dL — ABNORMAL LOW (ref 12.0–15.0)
MCHC: 33.4 g/dL (ref 30.0–36.0)
MCV: 95.8 fL (ref 78.0–100.0)
Platelets: 230 10*3/uL (ref 150–400)
RBC: 2.67 MIL/uL — ABNORMAL LOW (ref 3.87–5.11)
RBC: 2.85 MIL/uL — ABNORMAL LOW (ref 3.87–5.11)
RBC: 3.29 MIL/uL — ABNORMAL LOW (ref 3.87–5.11)
RDW: 14.4 % (ref 11.5–15.5)
WBC: 10.8 10*3/uL — ABNORMAL HIGH (ref 4.0–10.5)
WBC: 11.9 10*3/uL — ABNORMAL HIGH (ref 4.0–10.5)
WBC: 9.1 10*3/uL (ref 4.0–10.5)

## 2010-08-12 LAB — URINALYSIS, ROUTINE W REFLEX MICROSCOPIC
Glucose, UA: NEGATIVE mg/dL
Ketones, ur: 15 mg/dL — AB
Specific Gravity, Urine: 1.026 (ref 1.005–1.030)
pH: 6.5 (ref 5.0–8.0)

## 2010-08-12 LAB — BASIC METABOLIC PANEL
Calcium: 8.2 mg/dL — ABNORMAL LOW (ref 8.4–10.5)
Calcium: 9.3 mg/dL (ref 8.4–10.5)
Chloride: 93 mEq/L — ABNORMAL LOW (ref 96–112)
Creatinine, Ser: 0.84 mg/dL (ref 0.4–1.2)
GFR calc Af Amer: >60 mL/min (ref >60)
GFR calc non Af Amer: >60 mL/min (ref >60)
GFR calc non Af Amer: >60 mL/min (ref >60)

## 2010-08-12 LAB — PROTIME-INR
INR: 1.01 (ref 0.00–1.49)
INR: 1.33 (ref 0.00–1.49)
INR: 3.07 — ABNORMAL HIGH (ref 0.00–1.49)
Prothrombin Time: 13.2 seconds (ref 11.6–15.2)
Prothrombin Time: 16.4 seconds — ABNORMAL HIGH (ref 11.6–15.2)

## 2010-08-12 LAB — DIFFERENTIAL
Basophils Absolute: 0.1 10*3/uL (ref 0.0–0.1)
Basophils Relative: 1 % (ref 0–1)
Eosinophils Relative: 3 % (ref 0–5)
Monocytes Absolute: 0.5 10*3/uL (ref 0.1–1.0)

## 2010-08-12 LAB — TYPE AND SCREEN: Antibody Screen: NEGATIVE

## 2010-09-21 NOTE — Assessment & Plan Note (Signed)
[facility]                            CARDIOLOGY OFFICE NOTE   AVORY, RAHIMI                     MRN:          [id number]  DATE:05/01/2007                            DOB:          1932-09-16    Felicia Cross is a very pleasant female who has a history of hypertension  as well as hyperlipidemia.  She has had previous catheterization in  August 2006 that showed minor disease in the diagonal and distal LAD but  no other high-grade disease was noted.  Since I last saw her she is  doing well.  She has dyspnea with climbing stairs but at no other time.  There is no chest pain, palpitations or syncope.  There is no pedal  edema.   MEDICATIONS:  1. Include Premarin 0.65 mg p.o. daily.  2. Norvasc 5 mg p.o. daily.  3. Lisinopril HCT 20/25 daily.  4. Toprol 25 mg daily.  5. Aspirin 81 mg daily.  6. Zocor 40 mg p.o. daily.   PHYSICAL EXAM:  Today shows a blood pressure of 152/96 and pulse is 89.  She weighs 109 pounds.  HEENT:  Normal.  Neck is supple.  There is a left carotid bruit noted.  Her chest is clear.  CARDIOVASCULAR:  Regular rhythm.  ABDOMEN:  Shows no tenderness.  EXTREMITIES:  Show no edema.   Her electrocardiogram today shows a sinus rhythm at a rate of 87.  There  are no significant ST changes noted.   DIAGNOSIS:  1. History of minor plaquing in the left anterior descending and      diagonal - she will continue on aspirin, statin and beta blocker.  2. Hypertension - blood pressure is elevated today.  This is being      followed by Dr. Cato Mulligan.  Certainly if her Toprol or Norvasc can be      increased in the future as needed.  3. Hyperlipidemia - would check lipids liver and adjust as indicated.      Also check a BMET and follow potassium and renal function.  4. History of osteoarthritis.   She will continue with risk factor modification including diet,  exercise.  We will see her back in 1 year.  Note, we will check carotid  Dopplers given her carotid bruit.     Madolyn Frieze Jens Som, MD, [facility]  Electronically Signed    BSC/MedQ  DD: 05/01/2007  DT: 05/01/2007  Job #: [id number]

## 2010-09-21 NOTE — Assessment & Plan Note (Signed)
[facility]                            CARDIOLOGY OFFICE NOTE   MELLISSA, CONLEY                     MRN:          [id number]  DATE:05/12/2008                            DOB:          [birth year]    Ms. Bielak is a pleasant female who has a history of hypertension, as  well as hyperlipidemia.  She had a previous catheterization in August  2006, this showed minor disease in the diagonal and distal LAD.  We also  performed carotid Dopplers in December 2008, that showed 0-39% stenosis.  Since I last saw her, she denies any dyspnea, palpitations, or syncope  and there is no pedal edema.  She did state that yesterday she had an  episode of chest tightness.  Pain was substernal, but did not radiate.  It is not pleuritic or positional nor it is related to food.  I was not  exertional.  There is no associated nausea, vomiting, shortness of  breath, or diaphoresis.  It did not radiate.  It lasts for approximately  30 seconds and resolve.  She had no symptoms since then.   MEDICATIONS:  1. Premarin 0.625 mg p.o. daily.  2. Toprol 25 mg p.o. daily.  3. Aspirin 81 mg p.o. daily.  4. Zocor.  5. Lisinopril/HCT 20/25 b.i.d.  6. Vitamin C.  7. Vitamin D.  8. Potassium.   PHYSICAL EXAMINATION:  VITAL SIGNS:  Blood pressure of 138/90 and her  pulse is 70.  HEENT:  Normal.  NECK:  Supple.  CHEST:  Clear.  CARDIOVASCULAR:  Regular rate and rhythm.  ABDOMEN:  No tenderness.  EXTREMITIES:  No edema.   Electrocardiogram shows a sinus rhythm at a rate of 71.  There are no  significant ST changes noted.  There is mild left ventricular  hypertrophy.   DIAGNOSIS:  1. History of minor plaquing in the left anterior descending and      diagonal - The patient will continue on her aspirin, statin, and      beta-blocker.  2. Chest pain - Her symptoms are somewhat atypical.  We will plan to      proceed with a Myoview.  If it shows no ischemia, then we will not    pursue further cardiac workup.  3. Hypertension - Her blood pressure is mildly elevated today.  I will      ask her to increase her Toprol to 50 mg p.o. daily.  4. Hyperlipidemia - We will check lipids and liver and adjust as      indicated.  I will also check a basic metabolic panel, followup      potassium and renal function.  5. History of osteoarthritis.  6. History of mild cerebrovascular disease - She will need followup      carotid Dopplers.   I will see her back in 1 year.     Madolyn Frieze Jens Som, MD, [facility]  Electronically Signed    BSC/MedQ  DD: 05/12/2008  DT: 05/13/2008  Job #: [id number]

## 2010-09-24 NOTE — Cardiovascular Report (Signed)
NAMEAHLANI, WICKES NO.:  [id number]   MEDICAL RECORD NO.:  [id number]          PATIENT TYPE:  OIB   LOCATION:  6501                         FACILITY:  [facility]   PHYSICIAN:  Arturo Morton. Riley Kill, M.D. [facility] OF BIRTH:  14-Apr-1933   DATE OF PROCEDURE:  12/16/2004  DATE OF DISCHARGE:  12/16/2004                              CARDIAC CATHETERIZATION   INDICATIONS:  Ms. Cleland is a delightful 77 year old woman who works at  [employer]. She underwent Cardiolite study which suggested mild  abnormality. She and Dr. Andee Lineman discussed the various options for  evaluation.  Her main chief complaint is exertional dyspnea.  Ejection  fraction was now 70%, and there was possibly mild ischemia in the distal  anteroseptal wall. The current study was done to assess coronary anatomy.   PROCEDURE:  1.  Left heart catheterization.  2.  Selective coronary arteriography.  3.  Selective left ventriculography.   DESCRIPTION OF PROCEDURE:  Following informed consent and careful discussion  with the patient, the patient was brought to the catheterization laboratory  and prepped and draped in the usual fashion.  Through an anterior puncture,  the femoral artery was easily entered.  Through an anterior puncture, a 4-  French sheath was placed.  With the patient's marked elevation in blood  pressure, a 10 mg dose of intravenous labetalol was initially administered.  An additional dose was administered later in the procedure to control blood  pressure.  Views of the left and right coronary arteries were obtained in  multiple angiographic projections. Ventriculography was performed in the RAO  projection.  The patient tolerated the procedure well and there were no  complications.   HEMODYNAMIC DATA.:  1.  Central aortic pressure 215/105.  2.  Left ventricular pressure 186/12.  3.  No gradient on pullback across the aortic valve.  4.  At the completion of the procedure, the blood  pressure was running in      the range of about 160/80.   ANGIOGRAPHIC DATA:  1.  Ventriculography was performed in the RAO projection. Overall systolic      function was preserved. No segmental abnormalities or contraction were      identified.  2.  The right coronary artery is a very smooth appearing vessel. There was      no damping on engagement of the right coronary artery. There is mild      calcification in the aortic root just below the RCA takeoff. However,      the RCA itself appears to be smooth throughout, providing a posterior      descending and posterolateral branch; both of which are free of      significant disease  3.  The left main coronary artery is a large-caliber vessel and appears to      be free of significant disease.  4.  The left anterior descending artery courses to the apex.  The LAD itself      provides a diagonal branch, with perhaps minimal luminal irregularity at      its ostium; but, no significant evidence of high-grade  focal stenosis.      There is a segment at the distal portion of the left anterior descending      artery that appears to be perhaps intramyocardial, and there is perhaps      some minor irregularity at this flexed point; but, in the best RAO      cranial view there does not appear to be significant focal obstruction.  5.  The circumflex provides a major marginal branch and then courses      posteriorly as a smaller posterolateral system. The circumflex and its      branches appear to be free of critical disease.   CONCLUSIONS:  1.  Well-preserved overall left ventricular function.  2.  Minor irregularity of the first diagonal and mid distal LAD, without      evidence of high-grade focal obstruction.   RECOMMENDATIONS:  The patient will be treated medically. She will have  follow-up with Dr. Andee Lineman and Dr. Cato Mulligan.  Based on the angiographic  studies, I doubt that there is a direct correlation between the angiographic  findings in  the radionuclide images; although this could not be entirely  excluded.  Medical therapy would be the best option.       TDS/MEDQ  D:  12/16/2004  T:  12/16/2004  Job:  [id number]   cc:   Learta Codding, M.D. [facility]  [address]  [address]  [place]  [place] [address]   Valetta Mole. Swords, M.D. [facility]

## 2010-09-24 NOTE — Op Note (Signed)
NAMEMINDEL, FRISCIA NO.:  [id number]   MEDICAL RECORD NO.:  [id number]          PATIENT TYPE:  INP   LOCATION:  NA                           FACILITY:  [facility]   PHYSICIAN:  Mila Homer. Sherlean Foot, M.D. DATE OF BIRTH:  1932-12-07   DATE OF PROCEDURE:  10/10/2005  DATE OF DISCHARGE:                                 OPERATIVE REPORT   PREOPERATIVE DIAGNOSIS:  Left knee osteoarthritis.   POSTOPERATIVE DIAGNOSIS:  Left knee osteoarthritis.   PROCEDURE:  Left total knee arthroplasty.   INDICATIONS FOR PROCEDURE:  The patient is a 77 year old black female with  failure of conservative measures for osteoarthritis of the knee.  Informed  consent was obtained and medical clearance was obtained.   SURGEON:  Mila Homer. Lucey, MD.   DESCRIPTION OF PROCEDURE:  The patient was laid supine, administered general  anesthesia, and a Foley catheter placed.  The left leg was prepped and  draped in the usual sterile fashion.  The leg was then exsanguinated with  the Esmarch and the tourniquet elevated to 350 mmHg and set for one hour.  I  then used a #10 blade and made a midline incision approximately 10 cm in  length.  I then used a fresh blade to make a median parapatellar arthrotomy  and perform a synovectomy.  I then elevated the deep end of the patella off  the medial crest of the tibia.  I then everted the patella and measured 25  mm.  I reamed down to 16 mm with a 35-mm reamer and then used a 38-mm  template to drill through the lug holes and, with the prosthetic trial in  place, it recreated the 27-mm thickness.  I removed the prosthetic trial and  went into flexion.  I used the extramedullary alignment system to make a  perpendicular cut through the anatomic axis of the tibia, removing 2 mm of  bone off the medial tibial articular surface.  I removed that cut surface of  the bone and the extramedullary guide.  I then placed the intramedullary  drill into the intramedullary  canal of the femur.  I placed the  intramedullary guide on a 6 pre-valgus cut and pinned the distal femoral  cutting block and noted that it placed anteriorly.  I then made the distal  femoral cut with a sagittal saw.  I marked down at the epicondylar axis with  a marker.  I drew a __________ line and measured the posterior condylar  angle to be 3 degrees.  I sized to a size D and pinned through the 3 degree  external rotation holes.  I then made the anterior, posterior, and chamfer  cuts through the four-in-one cutting block.  I removed the guide.  I then  finished the femur with a size D finishing block, finished the tibia with a  size 5 tibial tray drill and keel.  I then trialed with the 5 tibia, D  femur, 12 insert, 3-A patella, had excellent flexion and extension gap,  balance good, excellent patella tracking.  I removed the trial components  and copiously  irrigated.  I then cemented in the components, removing excess  cement, snapping in the __________ lane.  I cemented in the patella and  removed the extra cement.  I then placed a Hemovac coming out  superolaterally and deepened the arthrotomy, a pain catheter coming out  superomedially and superficial to the arthrotomy.  I closed the arthrotomy  with interrupted #1 proximal.  I then let the tourniquet down when the  cement was hardened.  I obtained hemostasis and irrigated again, and then  closed the rest of the arthrotomy with figure-of-eight #1 Vicryl sutures,  the deep soft tissues with interrupted #0 Vicryl sutures, a subcuticular #2-  0 Vicryl stitch, and skin staples.   COMPLICATIONS:  None.   DRAINS:  One Hemovac, one pain catheter.   ESTIMATED BLOOD LOSS:  300 ml.   DRESSING:  Xeroform dressing with sponges, sterile Webril, and a TED  stocking.           ______________________________  Mila Homer. Sherlean Foot, M.D.     SDL/MEDQ  D:  10/10/2005  T:  10/10/2005  Job:  [id number]

## 2010-09-24 NOTE — H&P (Signed)
Felicia Cross, BREWTON NO.:  [id number]   MEDICAL RECORD NO.:  [id number]          PATIENT TYPE:  INP   LOCATION:  NA                           FACILITY:  [facility]   PHYSICIAN:  Mila Homer. Sherlean Foot, M.D. DATE OF BIRTH:  1933/04/10   DATE OF ADMISSION:  10/10/2005  DATE OF DISCHARGE:                                HISTORY & PHYSICAL   CHIEF COMPLAINT:  Left knee pain since March 2006.   HISTORY OF PRESENT ILLNESS:  This 77 year old black female patient presented  to Dr. Sherlean Foot with a 1-year history of gradual onset progressively worsening  left knee pain.  She has had no known injury or prior surgery to her knee  but the knee pain seems to be getting worse.  At this point, it is described  as a constant sharp, dull sensation diffuse about the joint without  radiation.  It increases with any prolonged walking and then decreases with  sitting and rest.  She has currently been taking some diclofenac with  minimal relief.  The knee does grind and swell and occasionally awakens her  at night.  There is no popping, catching, locking or giving way.  She has  received cortisone injections in the knees with minimal relief.  She does  not ambulate with any assistive devices.   ALLERGIES:  CODEINE causes severe nausea and vomiting.   CURRENT MEDICATIONS:  1.  Diclofenac 75 mg 1 tablet p.o. b.i.d. last dose Sep 29, 2005.  2.  Aspirin 81 mg 1 tablet p.o. q.a.m. last dose some time the week of the      14th.  3.  Premarin 6.25 mg 1 tablet p.o. q.a.m.  4.  Toprol 25 mg 1 tablet p.o. q.a.m.  5.  Norvasc 5 mg 1 tablet p.o. q.a.m.  6.  Hydrochlorothiazide 25 mg 1 tablet p.o. q.a.m.   PAST MEDICAL HISTORY:  Hypertension. She denies any history of diabetes  mellitus, thyroid disease, hiatal hernia, peptic ulcer disease, reflux,  heart disease, asthma or any other chronic medical condition other than  noted previously.   PAST SURGICAL HISTORY:  1.  Right breast lumpectomy in 1969.  2.  Hysterectomy in 1989.   She denies any complications from the above-mentioned procedures.   SOCIAL HISTORY:  She does have a 10 pack year history of cigarette smoking  which she quit 25-30 years ago.  She drinks about 1-2 glasses of wine a  week.  She does not use any drugs.  She is married and lives with her  husband in a two-story house.  She has four children, three daughters and  one son.  She currently works as a Herbalist.  Her medical doctor  is Dr. Birdie Sons and her cardiologist is Dr. Lewayne Bunting, both at [facility].   FAMILY HISTORY:  Brother and sister had a history of a heart attack.  Her  father had a history of hypertension.  Mom had a history of diabetes and dad  also had a history of kidney disease.   REVIEW OF SYSTEMS:  She does wear glasses.  She has the  high blood pressure.  She does complain of occasional ankle swelling.  All other systems were  negative and noncontributory.   PHYSICAL EXAM:  GENERAL:  Well-developed, well-nourished, mildly overweight  black female in no acute distress.  Talks easily with examiner.  Height 5  feet 4 inches, weight 176 pounds, BMI is 29.  Accompanied by her husband and  daughter.  VITAL SIGNS:  Temperature 98.2 degrees Fahrenheit, pulse 76, respirations  69, and BP 158/82.  HEENT:  Normocephalic, atraumatic without frontal or maxillary sinus  tenderness to palpation.  Conjunctiva pink.  Sclerae anicteric.  PERRLA.  EOMs intact.  No visible external ear deformities.  Hearing grossly intact.  A large amount of cerumen in both ear canals and cannot visualize the  tympanic membranes.  Nose and nasal septum midline.  Nasal mucosa pink and  moist without exudates or polyps noted.  Buccal mucosa pink and moist.  Dentition in good repair.  Pharynx without erythema or exudates.  Tongue and  uvula midline.  Tongue without fasciculations and uvula rises equally with  phonation.  NECK:  No visible masses or lesions noted.  Trachea  midline.  No palpable  lymphadenopathy nor thyromegaly.  Carotids +2 bilaterally without bruits.  Full range of motion, nontender to palpation along the cervical spine.  CARDIOVASCULAR:  Heart rate and rhythm regular.  S1-S2 present without rubs,  clicks or murmurs noted.  RESPIRATORY:  Respirations even and unlabored.  Breath sounds clear to  auscultation bilaterally without rales or wheezes noted.  ABDOMEN:  A rounded abdominal contour.  Bowel sounds present x4 quadrants.  Soft, nontender to palpation without hepatosplenomegaly nor CVA tenderness.  Femoral pulses +2 bilaterally.  Nontender to palpation along the vertebral  column.  BREASTS/GU/RECTAL/PELVIC:  These exams deferred at this time.  MUSCULOSKELETAL:  No obvious deformities bilateral upper extremities with  full range of motion of these extremities without pain.  Radial pulses +2  bilaterally.  She has full range of motion of her hips, ankles and toes  bilaterally.  DP and PT pulses are +2.  Mild lower extremity nonpitting  edema.  No calf pain with palpation.  Negative Homans' sign bilaterally.   Right knee has full extension and flexion to 120 degrees with minimal  crepitus.  There is maybe a small +1 effusion in the knee but no pain with  palpation along the joint line.  She is stable to varus and valgus stress.  Negative anterior drawer.  The left knee has full extension and flexion only  to 90 degrees but any range of motion causes pain.  There is no crepitus or  range of motion.  She is acutely tender to palpation over the medial joint  line, none laterally.  Small effusion.  Stable to varus and valgus stress.  Negative anterior drawer.   NEUROLOGIC:  Alert and oriented x3.  Cranial nerves II-XII are grossly  intact.  Strength 5/5 bilateral upper and lower extremities.  Rapid  alternating movements intact.  Deep tendon reflexes 2+ bilateral upper and lower extremities.   RADIOLOGIC FINDINGS:  X-rays taken of her left  knee in March 2007 show varus  deformity with complete bone on bone contact of osteoarthritis in that knee  and significant osteoarthritis in the right knee as well.   IMPRESSION:  1.  Bilateral osteoarthritis of the knees, left worse than right.  2.  Hypertension.   PLAN:  Ms. Karpowicz will be admitted to [facility] on October 10, 2005  where  she will undergo a left total knee arthroplasty by Dr. Mila Homer.  Lucey.  She will undergo all routine preoperative laboratory tests and  studies prior to this procedure.  If we have any medical issues while she is  hospitalized we will consult [place] hospitalists.      Legrand Pitts Duffy, P.A.    ______________________________  Mila Homer. Sherlean Foot, M.D.    KED/MEDQ  D:  09/29/2005  T:  09/29/2005  Job:  [id number]

## 2010-09-24 NOTE — Assessment & Plan Note (Signed)
[facility]                            CARDIOLOGY OFFICE NOTE   SUZI, HERNAN                     MRN:          [id number]  DATE:04/06/2006                            DOB:          [birth year]    Ms. Vahey is a very pleasant female who has a history of hypertension.  She underwent a stress test in April 2006 secondary to chest pain and  dyspnea.  She had very mild ischemia of the distal anteroseptal wall  with an ejection fraction of 70%.  Because of the abnormal nuclear  study, she underwent cardiac catheterization on December 16, 2004.  Her  ejection fraction was normal.  She had minor disease in the diagonal and  distal LAD, but there was no other high-grade focal obstruction noted.  She has been treated medically.  She also had a nuclear study repeated  in May secondary to preop evaluation prior to knee surgery.  There was  apical thinning, but the perfusion was otherwise normal with no  ischemia.  Ejection fraction was 71%.  Since then, she denies any  significant dyspnea with routine activities.  She can have some with  more extreme activities.  There is no orthopnea, PND, edema,  palpitations, presyncope, syncope or chest pain.   MEDICATIONS:  1. Premarin 0.625 mg p.o. daily.  2. Norvasc 5 mg p.o. daily.  3. Lisinopril HCT 20/25 mg p.o. daily.  4. Toprol 25 mg p.o. daily.  5. Aspirin 81 p.o. daily.   PHYSICAL EXAMINATION:  Shows a blood pressure of 128/76.  Her pulse is  81.  She weighs 173 pounds.  NECK:  Supple with no bruits.  CHEST:  Clear.  CARDIOVASCULAR:  Shows a regular rate and rhythm.  ABDOMEN:  Shows no pulsatile masses.  No bruits.  EXTREMITIES:  Show no edema.  Her electrocardiogram shows a sinus rhythm with occasional PVCs.  There  are no ST changes noted.   DIAGNOSES:  1. History of minor plaquing in the left anterior descending and      diagonal.  2. Hypertension.  3. History osteoarthritis status post knee  replacement.   PLAN:  Ms. Bartolotta is doing well from a symptomatic standpoint.  Her  recent nuclear study in May showed no ischemia and normal LV function.  Her blood pressure is well controlled.  We will continue with her  present medications.  I will check a BMET today to follow up on her  potassium and renal function, given her diuretic use.  We will also  check her cholesterol today.  We will treat if indicated.  I will see  her back in 1 year.     Madolyn Frieze Jens Som, MD, [facility]  Electronically Signed    BSC/MedQ  DD: 04/06/2006  DT: 04/07/2006  Job #: [id number]

## 2010-09-24 NOTE — Discharge Summary (Signed)
Felicia, Cross NO.:  [id number]   MEDICAL RECORD NO.:  [id number]          PATIENT TYPE:  INP   LOCATION:  5032                         FACILITY:  [facility]   PHYSICIAN:  Mila Homer. Sherlean Foot, M.D. DATE OF BIRTH:  December 30, 1932   DATE OF ADMISSION:  10/10/2005  DATE OF DISCHARGE:  10/13/2005                                 DISCHARGE SUMMARY   ADMISSION DIAGNOSIS:  Bilateral osteoarthritis of the knees, left greater  than right.   DISCHARGE DIAGNOSES:  1.  Bilateral osteoarthritis of the knees, left greater than right.  2.  Hypertension.  3.  Post hemorrhagic anemia.  4.  Hypokalemia.   PROCEDURE:  Left total knee arthroplasty.   HISTORY OF PRESENT ILLNESS:  This 21 year old black female presents with a 1  year history of gradual onset of progressively worsening left knee pain.  She has had no known injury or prior surgery to her knee, but the knee seems  to be getting worse.  At this point it is described as constant, sharp, dull  sensation, diffuse about the joint without radiation.  It increases with any  prolonged walking and then decreases with sitting and rest.  She has  currently been taking some Diclofenac with minimal relief.  The knee does  grind and swell, and occasionally wakes her at night.  There is no popping,  catching, locking or giving way.  She has received corticosteroid injections  in the knee with minimal relief.  She does not ambulate with assist devices.  She is now indicated for a total knee arthroplasty.   HOSPITAL COURSE:  A 77 year old female admitted 10/10/2005 after appropriate  laboratory studies were obtained as well as 1 g Ancef IV on-call in the  operating room.  She was taken to the operating room where she underwent a  left total knee arthroplasty.  She patient tolerated the procedure well.  She was placed on a low dose Dilaudid PCA pump postoperatively.  Foley was  placed intraoperatively.  CMP was placed from 0-90 degrees  for 6-8 hours a  day, increasing daily by 10 degrees.  Consultation with PT, OT and care  management was placed.  She was allowed weightbearing as tolerated.  She was  allowed out of bed to a chair the following daily.  Pulmonary toiletry was  ordered.  Incentive spirometry was encouraged.  PCA was weaned off.  She did  have hypokalemia and this was treated with K-Dur 40 mg p.o. daily starting  on 10/12/2005.  Lovenox teaching was begun.  Her saline lock was  discontinued on the 7th.  All lines were discontinued on the 7th and she was  discharged to home in improved condition to return back to followup with Dr.  Sherlean Foot.   LABORATORY STUDIES:  Admitted with a hemoglobin of 12.8, hematocrit 38.1%,  white count 6300, platelets 335,000.  Discharge hemoglobin 8.8, hematocrit  25.9, white count 11,000, platelets 249,000.  Preoperative chemistries  sodium 135, potassium 4.3, chloride 99, CO2 29, glucose 119, BUN 11,  creatinine 1.1, calcium 10.0, total protein 6.8, albumin 3.8, AST 25, ALT  20,  ALP 46, total bilirubin 0.6.  Her potassium dropped to 2.8, and on the  7th was sodium 136, potassium 3.0, chloride 97, CO2 31, glucose 126, BUN 3,  creatinine 0.9 and calcium 8.5.  Urinalysis benign for a voided urine.  Blood type was A positive.  Antibody screen negative.   DISCHARGE INSTRUCTIONS:  No restrictions in her diet.  Walk with her  crutches.  Weightbearing as tolerated.  May shower after 2 days of no  drainage.  Keep her wound clean and dry, and change dressings daily.  Call  the office if a temperature greater than 101.5 or if there is foul smelling  drainage or any signs of infection.  Resume her home medications.  Lovenox  40 mg injections daily for 12 days.  Percocet 5/325 one to two tablets every  4 hours as needed for pain.  No aspirin while on Lovenox.  She will followup  with Dr. Sherlean Foot two weeks postoperative.  CMP 0-90 degrees, increasing by 10  degrees a day.  She is to use it for  6-8 hours.  Home health to draw a  potassium and call the results.  She is discharged in improved condition.      Oris Drone Petrarca, P.A.-C.    ______________________________  Mila Homer. Sherlean Foot, M.D.    BDP/MEDQ  D:  11/25/2005  T:  11/25/2005  Job:  [id number]

## 2010-11-02 ENCOUNTER — Encounter: Payer: Self-pay | Admitting: Internal Medicine

## 2010-11-02 ENCOUNTER — Ambulatory Visit (INDEPENDENT_AMBULATORY_CARE_PROVIDER_SITE_OTHER): Payer: Medicare Other | Admitting: Internal Medicine

## 2010-11-02 VITALS — BP 134/84 | Temp 98.0°F | Ht 64.0 in | Wt 181.0 lb

## 2010-11-02 DIAGNOSIS — M25519 Pain in unspecified shoulder: Secondary | ICD-10-CM

## 2010-11-02 MED ORDER — MELOXICAM 7.5 MG PO TABS: 7.5000 mg | ORAL_TABLET | Freq: Every day | ORAL | Status: DC

## 2010-11-02 MED ORDER — MELOXICAM 7.5 MG PO TABS: 15.0000 mg | ORAL_TABLET | Freq: Every day | ORAL | Status: DC

## 2010-11-02 MED ORDER — HYDROCHLOROTHIAZIDE 25 MG PO TABS: 50.0000 mg | ORAL_TABLET | Freq: Every day | ORAL | Status: DC

## 2010-11-02 NOTE — Progress Notes (Signed)
  Subjective:    Felicia Cross is a 77 y.o. female who presents with right shoulder pain. The symptoms began several months ago. Aggravating factors: no known event. Pain is located around the acromioclavicular [facility]) joint. Discomfort is described as aching. Symptoms are exacerbated by nothing. Evaluation to date: none. Therapy to date includes: nothing specific.  The following portions of the patient's history were reviewed and updated as appropriate: allergies, current medications, past family history, past medical history, past social history, past surgical history and problem list.  Review of Systems  patient denies chest pain, shortness of breath, orthopnea. Denies lower extremity edema, abdominal pain, change in appetite, change in bowel movements. Patient denies rashes, musculoskeletal complaints. No other specific complaints in a complete review of systems.    Objective:    BP 134/84  Temp(Src) 98 F (36.7 C) (Oral)  Ht 5\' 4"  (1.626 m)  Wt 181 lb (82.101 kg)  BMI 31.07 kg/m2     Well-developed well-nourished female in no acute distress. HEENT exam atraumatic, normocephalic, extraocular muscles are intact. Neck is supple. No jugular venous distention no thyromegaly. Chest  without increased work of breathing. Cardiac exam S1 and S2 are regular.  Extremities no edema. FROM both shoulders. Pain when resisting abduction   Assessment:    Right shoulder pain      lan:    discussed ice nsaid Call if sxs worsen Side effects discussed

## 2011-01-14 ENCOUNTER — Ambulatory Visit (INDEPENDENT_AMBULATORY_CARE_PROVIDER_SITE_OTHER): Payer: Medicare Other | Admitting: Internal Medicine

## 2011-01-14 ENCOUNTER — Encounter: Payer: Self-pay | Admitting: Internal Medicine

## 2011-01-14 VITALS — BP 162/82 | Temp 98.1°F | Wt 181.0 lb

## 2011-01-14 DIAGNOSIS — S0101XA Laceration without foreign body of scalp, initial encounter: Secondary | ICD-10-CM

## 2011-01-14 DIAGNOSIS — S0100XA Unspecified open wound of scalp, initial encounter: Secondary | ICD-10-CM

## 2011-01-20 NOTE — Progress Notes (Signed)
  Subjective:    Patient ID: Felicia Cross, female    DOB: 02-14-1933, 77 y.o.   MRN: [id number]  HPI    Review of Systems     Objective:   Physical Exam        Assessment & Plan:  Laceration: 20 minutes total time evaluating laceration, soaking wound and removing 5 sutures

## 2011-05-11 ENCOUNTER — Telehealth: Payer: Self-pay | Admitting: Cardiology

## 2011-05-11 ENCOUNTER — Other Ambulatory Visit: Payer: Self-pay | Admitting: Obstetrics and Gynecology

## 2011-05-11 DIAGNOSIS — Z1231 Encounter for screening mammogram for malignant neoplasm of breast: Secondary | ICD-10-CM

## 2011-05-19 ENCOUNTER — Ambulatory Visit (INDEPENDENT_AMBULATORY_CARE_PROVIDER_SITE_OTHER): Payer: Medicare Other | Admitting: Cardiology

## 2011-05-19 ENCOUNTER — Encounter: Payer: Self-pay | Admitting: Cardiology

## 2011-05-19 DIAGNOSIS — E7800 Pure hypercholesterolemia, unspecified: Secondary | ICD-10-CM

## 2011-05-19 DIAGNOSIS — I2510 Atherosclerotic heart disease of native coronary artery without angina pectoris: Secondary | ICD-10-CM

## 2011-05-19 LAB — BASIC METABOLIC PANEL
BUN: 20 mg/dL (ref 6–23)
Calcium: 9.3 mg/dL (ref 8.4–10.5)
GFR: 65.49 mL/min (ref >60.00)
Glucose, Bld: 100 mg/dL — ABNORMAL HIGH (ref 70–99)
Potassium: 3.7 mEq/L (ref 3.5–5.1)
Sodium: 139 mEq/L (ref 135–145)

## 2011-05-19 LAB — LIPID PANEL
HDL: 61.1 mg/dL (ref >39.00)
Triglycerides: 61 mg/dL (ref 0.0–149.0)

## 2011-05-19 LAB — LDL CHOLESTEROL, DIRECT: Direct LDL: 138.1 mg/dL

## 2011-05-19 LAB — HEPATIC FUNCTION PANEL
ALT: 14 U/L (ref 0–35)
AST: 22 U/L (ref 0–37)
Total Bilirubin: 0.4 mg/dL (ref 0.3–1.2)
Total Protein: 7 g/dL (ref 6.0–8.3)

## 2011-05-19 MED ORDER — METOPROLOL TARTRATE 25 MG PO TABS: 25.0000 mg | ORAL_TABLET | Freq: Two times a day (BID) | ORAL | Status: DC

## 2011-05-19 NOTE — Assessment & Plan Note (Signed)
Continue aspirin and statin. 

## 2011-05-19 NOTE — Assessment & Plan Note (Signed)
Continue statin. Check lipids and liver. 

## 2011-05-19 NOTE — Patient Instructions (Signed)
Your physician wants you to follow-up in: ONE YEAR You will receive a reminder letter in the mail two months in advance. If you don't receive a letter, please call our office to schedule the follow-up appointment.   STOP TOPROL  START LOPRESSOR (METOPROLOL TART) 25 MG ONE TABLET TWICE DAILY  Your physician recommends that you return for lab work in: TODAY

## 2011-05-19 NOTE — Assessment & Plan Note (Signed)
Continue aspirin and statin. Followup studies not deemed necessary.

## 2011-05-19 NOTE — Progress Notes (Signed)
   HPI:Felicia Cross is a pleasant female who has a history of hypertension, as well as hyperlipidemia.  She had a previous catheterization in August 2006, this showed minor disease in the diagonal and distal LAD.  We also performed carotid Dopplers in Feb 2012 that showed 0-39% stenosis. F/U not deemed necessary as this had been stable over serial exams. Myoview on May 14, 2008; Ejection fraction was 66% and there was no ischemia or infarction. Since I last saw her in Jan 2012 the patient denies any dyspnea on exertion, orthopnea, PND, pedal edema, palpitations, syncope or chest pain.  Current Outpatient Prescriptions  Medication Sig Dispense Refill  . aspirin 81 MG tablet Take 81 mg by mouth daily.        . ferrous gluconate (FERGON) 325 MG tablet Take 325 mg by mouth daily with breakfast.        . hydrochlorothiazide 25 MG tablet Take 2 tablets (50 mg total) by mouth daily.  180 tablet  3  . metoprolol succinate (TOPROL-XL) 25 MG 24 hr tablet Take 25 mg by mouth daily.      . Multiple Vitamin (MULTIVITAMIN) tablet Take 1 tablet by mouth daily.        . Potassium Gluconate 550 MG TABS Take 1 tablet by mouth daily.      . simvastatin (ZOCOR) 40 MG tablet Take 40 mg by mouth at bedtime.           Past Medical History  Diagnosis Date  . Diverticulosis of colon   . Arthritis   . Hypertension   . Hyperlipidemia   . CVD (cerebrovascular disease)   . CAD (coronary artery disease)     Past Surgical History  Procedure Date  . Abdominal hysterectomy   . Replacement total knee     History   Social History  . Marital Status: Married    Spouse Name: N/A    Number of Children: N/A  . Years of Education: N/A   Occupational History  . Not on file.   Social History Main Topics  . Smoking status: Former Smoker    Quit date: 11/01/1980  . Smokeless tobacco: Not on file  . Alcohol Use: Yes  . Drug Use: No  . Sexually Active: Not on file   Other Topics Concern  . Not on file    Social History Narrative  . No narrative on file    ROS: no fevers or chills, productive cough, hemoptysis, dysphasia, odynophagia, melena, hematochezia, dysuria, hematuria, rash, seizure activity, orthopnea, PND, pedal edema, claudication. Remaining systems are negative.  Physical Exam: Well-developed well-nourished in no acute distress.  Skin is warm and dry.  HEENT is normal.  Neck is supple. No thyromegaly.  Chest is clear to auscultation with normal expansion.  Cardiovascular exam is regular rate and rhythm.  Abdominal exam nontender or distended. No masses palpated. Extremities show no edema. neuro grossly intact  ECG sinus rhythm at a rate of 86. Occasional PVC. No significant ST changes.

## 2011-05-19 NOTE — Assessment & Plan Note (Signed)
Blood pressure mildly elevated. She requests short acting metoprolol for improved cost. Discontinue Toprol and begin metoprolol 25 mg by mouth twice a day. Check potassium and renal function.

## 2011-05-26 ENCOUNTER — Encounter: Payer: Self-pay | Admitting: *Deleted

## 2011-06-13 ENCOUNTER — Ambulatory Visit
Admission: RE | Admit: 2011-06-13 | Discharge: 2011-06-13 | Disposition: A | Discharge status: Home / Self Care | Payer: Medicare Other | Source: Ambulatory Visit | Attending: Obstetrics and Gynecology | Admitting: Obstetrics and Gynecology

## 2011-06-13 ENCOUNTER — Ambulatory Visit (INDEPENDENT_AMBULATORY_CARE_PROVIDER_SITE_OTHER): Payer: Medicare Other | Admitting: Internal Medicine

## 2011-06-13 ENCOUNTER — Encounter: Payer: Self-pay | Admitting: Internal Medicine

## 2011-06-13 DIAGNOSIS — S0083XA Contusion of other part of head, initial encounter: Secondary | ICD-10-CM

## 2011-06-13 DIAGNOSIS — I10 Essential (primary) hypertension: Secondary | ICD-10-CM

## 2011-06-13 DIAGNOSIS — Z1231 Encounter for screening mammogram for malignant neoplasm of breast: Secondary | ICD-10-CM

## 2011-06-13 DIAGNOSIS — S0003XA Contusion of scalp, initial encounter: Secondary | ICD-10-CM

## 2011-06-13 DIAGNOSIS — S1093XA Contusion of unspecified part of neck, initial encounter: Secondary | ICD-10-CM

## 2011-06-13 NOTE — Patient Instructions (Signed)
You  may move around, but avoid painful motions and activities.  Apply ice to the sore area for 15 to 20 minutes 3 or 4 times  until bedtime   Take Aleve 200 mg twice daily for pain or swelling  Call or return to clinic prn if these symptoms worsen or fail to improve as anticipated.

## 2011-06-13 NOTE — Progress Notes (Signed)
  Subjective:    Patient ID: Felicia Cross, female    DOB: [birth year], 78 y.o.   MRN: [id number]  HPI  78 year old patient who has a history of treated hypertension. At 1 PM today she fell walking up a flight of stairs sustaining trauma to her left frontal scalp area. No loss of consciousness. She denies any headache dizziness vertigo confusion. Except for the soft tissue trauma she feels well    Review of Systems  Skin: Positive for wound.       Objective:   Physical Exam  Constitutional: She is oriented to person, place, and time. She appears well-developed and well-nourished. No distress.  Eyes: Pupils are equal, round, and reactive to light.  Neurological: She is alert and oriented to person, place, and time. She has normal reflexes. Coordination normal.  Skin: Skin is warm and dry.       Soft tissue swelling and hematoma formation in the left frontal scalp area There is a superficial abrasion and some early ecchymosis          Assessment & Plan:    Left scalp contusion/hematoma. Local wound care discussed. She will apply up cold compresses intermittently until she retires for the evening. Hypertension stable

## 2011-06-20 ENCOUNTER — Telehealth: Payer: Self-pay | Admitting: Cardiology

## 2011-06-20 MED ORDER — SIMVASTATIN 40 MG PO TABS: 40.0000 mg | ORAL_TABLET | Freq: Every day | ORAL | Status: DC

## 2011-06-20 NOTE — Telephone Encounter (Signed)
Pt calling re needing a refill simvastatin 40 mg, uses [facility]

## 2011-07-19 ENCOUNTER — Encounter: Payer: Self-pay | Admitting: Internal Medicine

## 2011-07-19 ENCOUNTER — Ambulatory Visit (INDEPENDENT_AMBULATORY_CARE_PROVIDER_SITE_OTHER): Payer: Medicare Other | Admitting: Internal Medicine

## 2011-07-19 VITALS — BP 134/96 | HR 84 | Temp 98.5°F | Ht 64.0 in | Wt 178.0 lb

## 2011-07-19 DIAGNOSIS — Z0000 Encounter for general adult medical examination without abnormal findings: Secondary | ICD-10-CM

## 2011-07-19 DIAGNOSIS — I6529 Occlusion and stenosis of unspecified carotid artery: Secondary | ICD-10-CM

## 2011-07-19 DIAGNOSIS — E785 Hyperlipidemia, unspecified: Secondary | ICD-10-CM

## 2011-07-19 DIAGNOSIS — I2510 Atherosclerotic heart disease of native coronary artery without angina pectoris: Secondary | ICD-10-CM

## 2011-07-19 DIAGNOSIS — I10 Essential (primary) hypertension: Secondary | ICD-10-CM

## 2011-07-19 LAB — LIPID PANEL
Cholesterol: 155 mg/dL (ref 0–200)
LDL Cholesterol: 79 mg/dL (ref 0–99)
Total CHOL/HDL Ratio: 3
VLDL: 16.2 mg/dL (ref 0.0–40.0)

## 2011-07-19 NOTE — Progress Notes (Signed)
Patient ID: Felicia Cross, female   DOB: 01/14/1933, 78 y.o.   MRN: [id number]  CPX  Past Medical History  Diagnosis Date  . Diverticulosis of colon   . Arthritis   . Hypertension   . Hyperlipidemia   . CVD (cerebrovascular disease)   . CAD (coronary artery disease)     History   Social History  . Marital Status: Married    Spouse Name: N/A    Number of Children: N/A  . Years of Education: N/A   Occupational History  . Not on file.   Social History Main Topics  . Smoking status: Former Smoker    Quit date: 11/01/1980  . Smokeless tobacco: Not on file  . Alcohol Use: Yes  . Drug Use: No  . Sexually Active: Not on file   Other Topics Concern  . Not on file   Social History Narrative  . No narrative on file    Past Surgical History  Procedure Date  . Abdominal hysterectomy   . Replacement total knee     Family History  Problem Relation Age of Onset  . Diabetes Mother   . Hypertension Father   . Kidney disease Father   . Heart attack Sister   . Heart attack Brother     Allergies  Allergen Reactions  . Codeine     REACTION: gi upset  . Hydrocodone     REACTION: gi upset    Current Outpatient Prescriptions on File Prior to Visit  Medication Sig Dispense Refill  . aspirin 81 MG tablet Take 81 mg by mouth daily.        . ferrous gluconate (FERGON) 325 MG tablet Take 325 mg by mouth daily with breakfast.        . hydrochlorothiazide 25 MG tablet Take 2 tablets (50 mg total) by mouth daily.  180 tablet  3  . metoprolol tartrate (LOPRESSOR) 25 MG tablet Take 1 tablet (25 mg total) by mouth 2 (two) times daily.  180 tablet  6  . Multiple Vitamin (MULTIVITAMIN) tablet Take 1 tablet by mouth daily.        . Potassium Gluconate 550 MG TABS Take 1 tablet by mouth daily.      . simvastatin (ZOCOR) 40 MG tablet Take 1 tablet (40 mg total) by mouth at bedtime.  30 tablet  12     patient denies chest pain, shortness of breath, orthopnea. Denies lower extremity  edema, abdominal pain, change in appetite, change in bowel movements. Patient denies rashes, musculoskeletal complaints. No other specific complaints in a complete review of systems.   BP 134/96  Pulse 84  Temp(Src) 98.5 F (36.9 C) (Oral)  Ht 5\' 4"  (1.626 m)  Wt 178 lb (80.74 kg)  BMI 30.55 kg/m2  Well-developed well-nourished female in no acute distress. HEENT exam atraumatic, normocephalic, extraocular muscles are intact. Neck is supple. No jugular venous distention no thyromegaly. Chest clear to auscultation without increased work of breathing. Cardiac exam S1 and S2 are regular. Abdominal exam active bowel sounds, soft, nontender. Extremities no edema. Neurologic exam she is alert without any motor sensory deficits. Gait is normal.  A/P- Well visit- health maint UTD She did not take BP medication this a.m. She will monitor home BPs

## 2011-07-19 NOTE — Patient Instructions (Signed)
Call your insurance company and see if they will cover shingles vaccine. If they will, call us and we will give it to you  

## 2011-07-20 NOTE — Assessment & Plan Note (Signed)
No sxs. Reviewed cath report from 2006

## 2011-07-20 NOTE — Progress Notes (Signed)
Quick Note:  Pt aware ______ 

## 2012-02-16 ENCOUNTER — Encounter: Payer: Self-pay | Admitting: Gastroenterology

## 2012-02-22 ENCOUNTER — Other Ambulatory Visit: Payer: Self-pay | Admitting: Internal Medicine

## 2012-04-30 ENCOUNTER — Encounter: Payer: Self-pay | Admitting: Gastroenterology

## 2012-05-21 ENCOUNTER — Other Ambulatory Visit: Payer: Self-pay | Admitting: Internal Medicine

## 2012-05-21 DIAGNOSIS — Z1231 Encounter for screening mammogram for malignant neoplasm of breast: Secondary | ICD-10-CM

## 2012-06-08 ENCOUNTER — Ambulatory Visit (AMBULATORY_SURGERY_CENTER): Payer: Medicare Other | Admitting: *Deleted

## 2012-06-08 VITALS — Ht 64.0 in | Wt 172.0 lb

## 2012-06-08 DIAGNOSIS — Z1211 Encounter for screening for malignant neoplasm of colon: Secondary | ICD-10-CM

## 2012-06-08 MED ORDER — NA SULFATE-K SULFATE-MG SULF 17.5-3.13-1.6 GM/177ML PO SOLN
ORAL | Status: DC
Start: 2012-06-08 — End: 2012-06-11

## 2012-06-11 ENCOUNTER — Ambulatory Visit (INDEPENDENT_AMBULATORY_CARE_PROVIDER_SITE_OTHER): Payer: Medicare Other | Admitting: Cardiology

## 2012-06-11 ENCOUNTER — Encounter: Payer: Self-pay | Admitting: Cardiology

## 2012-06-11 VITALS — BP 120/80 | HR 62 | Wt 169.0 lb

## 2012-06-11 DIAGNOSIS — I6529 Occlusion and stenosis of unspecified carotid artery: Secondary | ICD-10-CM

## 2012-06-11 DIAGNOSIS — I10 Essential (primary) hypertension: Secondary | ICD-10-CM

## 2012-06-11 DIAGNOSIS — I2510 Atherosclerotic heart disease of native coronary artery without angina pectoris: Secondary | ICD-10-CM

## 2012-06-11 LAB — BASIC METABOLIC PANEL
CO2: 30 mEq/L (ref 19–32)
Calcium: 9.5 mg/dL (ref 8.4–10.5)
Chloride: 103 mEq/L (ref 96–112)
Creatinine, Ser: 1.1 mg/dL (ref 0.4–1.2)
Glucose, Bld: 90 mg/dL (ref 70–99)

## 2012-06-11 LAB — HEPATIC FUNCTION PANEL
AST: 20 U/L (ref 0–37)
Albumin: 3.9 g/dL (ref 3.5–5.2)
Alkaline Phosphatase: 42 U/L (ref 39–117)
Bilirubin, Direct: 0 mg/dL (ref 0.0–0.3)
Total Bilirubin: 0.5 mg/dL (ref 0.3–1.2)

## 2012-06-11 LAB — LIPID PANEL
LDL Cholesterol: 121 mg/dL — ABNORMAL HIGH (ref 0–99)
Total CHOL/HDL Ratio: 3

## 2012-06-11 NOTE — Assessment & Plan Note (Signed)
Continue present BP meds; check K and renal function.

## 2012-06-11 NOTE — Patient Instructions (Addendum)
Your physician wants you to follow-up in: ONE YEAR WITH DR CRENSHAW You will receive a reminder letter in the mail two months in advance. If you don't receive a letter, please call our office to schedule the follow-up appointment.   Your physician recommends that you HAVE LAB WORK TODAY 

## 2012-06-11 NOTE — Assessment & Plan Note (Signed)
Continue statin. Check lipids and liver. 

## 2012-06-11 NOTE — Progress Notes (Signed)
   HPI: Felicia Cross is a pleasant female who has a history of hypertension, as well as hyperlipidemia and minimal CAD. She had a previous catheterization in August 2006, this showed minor disease in the diagonal and distal LAD. We also performed carotid Dopplers in Feb 2012 that showed 0-39% stenosis. F/U not deemed necessary as this had been stable over serial exams. Myoview on May 14, 2008; Ejection fraction was 66% and there was no ischemia or infarction. Since I last saw her in Jan 2013 the patient denies any dyspnea on exertion, orthopnea, PND, pedal edema, palpitations, syncope or chest pain.   Current Outpatient Prescriptions  Medication Sig Dispense Refill  . aspirin 81 MG tablet Take 81 mg by mouth daily.        . ferrous gluconate (FERGON) 325 MG tablet Take 325 mg by mouth daily with breakfast.        . hydrochlorothiazide (HYDRODIURIL) 25 MG tablet TAKE TWO TABLETS BY MOUTH EVERY DAY  180 tablet  2  . Ibuprofen (ADVIL PO) Take by mouth as needed.       . metoprolol tartrate (LOPRESSOR) 25 MG tablet Take 1 tablet (25 mg total) by mouth 2 (two) times daily.  180 tablet  6  . Multiple Vitamin (MULTIVITAMIN) tablet Take 1 tablet by mouth daily.        . Potassium Gluconate 550 MG TABS Take 1 tablet by mouth daily.      . simvastatin (ZOCOR) 40 MG tablet Take 1 tablet (40 mg total) by mouth at bedtime.  30 tablet  12     Past Medical History  Diagnosis Date  . Diverticulosis of colon   . Arthritis   . Hypertension   . Hyperlipidemia   . CVD (cerebrovascular disease)   . CAD (coronary artery disease)     Past Surgical History  Procedure Date  . Abdominal hysterectomy 1984  . Replacement total knee 2007    left  . Total hip arthroplasty 2010    Dr. Turner Daniels, right    History   Social History  . Marital Status: Married    Spouse Name: N/A    Number of Children: N/A  . Years of Education: N/A   Occupational History  . Not on file.   Social History Main Topics  .  Smoking status: Former Smoker    Quit date: 11/01/1980  . Smokeless tobacco: Never Used  . Alcohol Use: Yes     Comment: occasional glass wine  . Drug Use: No  . Sexually Active: Not on file   Other Topics Concern  . Not on file   Social History Narrative  . No narrative on file    ROS: no fevers or chills, productive cough, hemoptysis, dysphasia, odynophagia, melena, hematochezia, dysuria, hematuria, rash, seizure activity, orthopnea, PND, pedal edema, claudication. Remaining systems are negative.  Physical Exam: Well-developed well-nourished in no acute distress.  Skin is warm and dry.  HEENT is normal.  Neck is supple.  Chest is clear to auscultation with normal expansion.  Cardiovascular exam is regular rate and rhythm.  Abdominal exam nontender or distended. No masses palpated. Extremities show no edema. neuro grossly intact  ECG sinus rhythm at a rate of 81. No ST changes.

## 2012-06-11 NOTE — Assessment & Plan Note (Signed)
Continue aspirin and statin. 

## 2012-06-11 NOTE — Assessment & Plan Note (Signed)
Continue aspirin and statin. Previous Dopplers suggested no followup necessary.

## 2012-06-12 ENCOUNTER — Encounter: Payer: Self-pay | Admitting: *Deleted

## 2012-06-20 ENCOUNTER — Ambulatory Visit
Admission: RE | Admit: 2012-06-20 | Discharge: 2012-06-20 | Disposition: A | Discharge status: Home / Self Care | Payer: Medicare Other | Source: Ambulatory Visit | Attending: Internal Medicine | Admitting: Internal Medicine

## 2012-06-20 DIAGNOSIS — Z1231 Encounter for screening mammogram for malignant neoplasm of breast: Secondary | ICD-10-CM

## 2012-06-22 ENCOUNTER — Encounter: Payer: Medicare Other | Admitting: Gastroenterology

## 2012-06-25 ENCOUNTER — Other Ambulatory Visit: Payer: Self-pay | Admitting: Cardiology

## 2012-08-02 ENCOUNTER — Encounter: Payer: Self-pay | Admitting: Gastroenterology

## 2012-08-02 ENCOUNTER — Ambulatory Visit (AMBULATORY_SURGERY_CENTER): Payer: Medicare Other | Admitting: Gastroenterology

## 2012-08-02 VITALS — BP 150/83 | HR 57 | Temp 96.1°F | Resp 16 | Ht 64.0 in | Wt 172.0 lb

## 2012-08-02 DIAGNOSIS — Z1211 Encounter for screening for malignant neoplasm of colon: Secondary | ICD-10-CM

## 2012-08-02 DIAGNOSIS — K5730 Diverticulosis of large intestine without perforation or abscess without bleeding: Secondary | ICD-10-CM

## 2012-08-02 MED ORDER — SODIUM CHLORIDE 0.9 % IV SOLN: 500.0000 mL | INTRAVENOUS | Status: DC

## 2012-08-02 NOTE — Op Note (Signed)
[facility] [address] [place], [address]   COLONOSCOPY PROCEDURE REPORT  PATIENT: Felicia, Cross  MR#: [id number] BIRTHDATE: 12-Sep-1932 , 79  yrs. old GENDER: Female ENDOSCOPIST: Louis Meckel, MD REFERRED BY: PROCEDURE DATE:  08/02/2012 PROCEDURE:   Colonoscopy, diagnostic ASA CLASS:   Class II INDICATIONS:average risk screening. MEDICATIONS: MAC sedation, administered by CRNA and propofol (Diprivan) 100mg  IV  DESCRIPTION OF PROCEDURE:   After the risks benefits and alternatives of the procedure were thoroughly explained, informed consent was obtained.  A digital rectal exam revealed no abnormalities of the rectum.   The LB [id number]  endoscope was introduced through the anus and advanced to the cecum, which was identified by both the appendix and ileocecal valve. No adverse events experienced.   The quality of the prep was Suprep excellent The instrument was then slowly withdrawn as the colon was fully examined.      COLON FINDINGS: Moderate diverticulosis was noted in the sigmoid colon.   The colon mucosa was otherwise normal.     The time to cecum=3 minutes 42 seconds.  Withdrawal time=6 minutes 30 seconds. The scope was withdrawn and the procedure completed. COMPLICATIONS: There were no complications.  ENDOSCOPIC IMPRESSION: 1.   Moderate diverticulosis was noted in the sigmoid colon 2.   The colon mucosa was otherwise normal  RECOMMENDATIONS: Given your age, you will not need another colonoscopy for colon cancer screening or polyp surveillance.  These types of tests usually stop around the age 8.   eSigned:  Louis Meckel, MD 08/02/2012 8:36 AM   cc: Lindley Magnus, MD

## 2012-08-02 NOTE — Patient Instructions (Signed)
YOU HAD AN ENDOSCOPIC PROCEDURE TODAY AT THE [facility]: Refer to the procedure report that was given to you for any specific questions about what was found during the examination.  If the procedure report does not answer your questions, please call your gastroenterologist to clarify.  If you requested that your care partner not be given the details of your procedure findings, then the procedure report has been included in a sealed envelope for you to review at your convenience later.  YOU SHOULD EXPECT: Some feelings of bloating in the abdomen. Passage of more gas than usual.  Walking can help get rid of the air that was put into your GI tract during the procedure and reduce the bloating. If you had a lower endoscopy (such as a colonoscopy or flexible sigmoidoscopy) you may notice spotting of blood in your stool or on the toilet paper. If you underwent a bowel prep for your procedure, then you may not have a normal bowel movement for a few days.  DIET: Your first meal following the procedure should be a light meal and then it is ok to progress to your normal diet.  A half-sandwich or bowl of soup is an example of a good first meal.  Heavy or fried foods are harder to digest and may make you feel nauseous or bloated.  Likewise meals heavy in dairy and vegetables can cause extra gas to form and this can also increase the bloating.  Drink plenty of fluids but you should avoid alcoholic beverages for 24 hours.  ACTIVITY: Your care partner should take you home directly after the procedure.  You should plan to take it easy, moving slowly for the rest of the day.  You can resume normal activity the day after the procedure however you should NOT DRIVE or use heavy machinery for 24 hours (because of the sedation medicines used during the test).    SYMPTOMS TO REPORT IMMEDIATELY: A gastroenterologist can be reached at any hour.  During normal business hours, 8:30 AM to 5:00 PM Monday through Friday,  call [phone].  After hours and on weekends, please call the GI answering service at [phone] who will take a message and have the physician on call contact you.   Following lower endoscopy (colonoscopy or flexible sigmoidoscopy):  Excessive amounts of blood in the stool  Significant tenderness or worsening of abdominal pains  Swelling of the abdomen that is new, acute  Fever of 100F or higher    FOLLOW UP: If any biopsies were taken you will be contacted by phone or by letter within the next 1-3 weeks.  Call your gastroenterologist if you have not heard about the biopsies in 3 weeks.  Our staff will call the home number listed on your records the next business day following your procedure to check on you and address any questions or concerns that you may have at that time regarding the information given to you following your procedure. This is a courtesy call and so if there is no answer at the home number and we have not heard from you through the emergency physician on call, we will assume that you have returned to your regular daily activities without incident.  SIGNATURES/CONFIDENTIALITY: You and/or your care partner have signed paperwork which will be entered into your electronic medical record.  These signatures attest to the fact that that the information above on your After Visit Summary has been reviewed and is understood.  Full responsibility of the confidentiality   of this discharge information lies with you and/or your care-partner.     

## 2012-08-02 NOTE — Progress Notes (Signed)
Lidocaine-40mg IV prior to Propofol InductionPropofol given over incremental dosages 

## 2012-08-02 NOTE — Progress Notes (Signed)
Patient did not experience any of the following events: a burn prior to discharge; a fall within the facility; wrong site/side/patient/procedure/implant event; or a hospital transfer or hospital admission upon discharge from the facility. (G8907) Patient did not have preoperative order for IV antibiotic SSI prophylaxis. (G8918)  

## 2012-08-03 ENCOUNTER — Telehealth: Payer: Self-pay | Admitting: *Deleted

## 2012-08-03 NOTE — Telephone Encounter (Signed)
  Follow up Call-  Call back number 08/02/2012  Post procedure Call Back phone  # [phone] hm  Permission to leave phone message Yes     Patient questions:  Do you have a fever, pain , or abdominal swelling? no Pain Score  0 *  Have you tolerated food without any problems? yes  Have you been able to return to your normal activities? yes  Do you have any questions about your discharge instructions: Diet   no Medications  no Follow up visit  no  Do you have questions or concerns about your Care? no  Actions: * If pain score is 4 or above: No action needed, pain <4.

## 2012-08-21 ENCOUNTER — Other Ambulatory Visit: Payer: Self-pay | Admitting: *Deleted

## 2012-08-21 MED ORDER — SIMVASTATIN 40 MG PO TABS: ORAL_TABLET | ORAL | Status: DC

## 2012-08-21 MED ORDER — METOPROLOL TARTRATE 25 MG PO TABS: ORAL_TABLET | ORAL | Status: DC

## 2012-08-26 ENCOUNTER — Emergency Department (HOSPITAL_COMMUNITY)
Admission: EM | Admit: 2012-08-26 | Discharge: 2012-08-26 | Disposition: A | Discharge status: Home / Self Care | Payer: Medicare Other | Attending: Emergency Medicine | Admitting: Emergency Medicine

## 2012-08-26 ENCOUNTER — Emergency Department (HOSPITAL_COMMUNITY): Payer: Medicare Other

## 2012-08-26 ENCOUNTER — Encounter (HOSPITAL_COMMUNITY): Payer: Self-pay | Admitting: Emergency Medicine

## 2012-08-26 DIAGNOSIS — S43004A Unspecified dislocation of right shoulder joint, initial encounter: Secondary | ICD-10-CM

## 2012-08-26 DIAGNOSIS — M129 Arthropathy, unspecified: Secondary | ICD-10-CM | POA: Insufficient documentation

## 2012-08-26 DIAGNOSIS — S43016A Anterior dislocation of unspecified humerus, initial encounter: Secondary | ICD-10-CM | POA: Insufficient documentation

## 2012-08-26 DIAGNOSIS — Z87891 Personal history of nicotine dependence: Secondary | ICD-10-CM | POA: Insufficient documentation

## 2012-08-26 DIAGNOSIS — Y9301 Activity, walking, marching and hiking: Secondary | ICD-10-CM | POA: Insufficient documentation

## 2012-08-26 DIAGNOSIS — Z8679 Personal history of other diseases of the circulatory system: Secondary | ICD-10-CM | POA: Insufficient documentation

## 2012-08-26 DIAGNOSIS — Y9229 Other specified public building as the place of occurrence of the external cause: Secondary | ICD-10-CM | POA: Insufficient documentation

## 2012-08-26 DIAGNOSIS — Z79899 Other long term (current) drug therapy: Secondary | ICD-10-CM | POA: Insufficient documentation

## 2012-08-26 DIAGNOSIS — E785 Hyperlipidemia, unspecified: Secondary | ICD-10-CM | POA: Insufficient documentation

## 2012-08-26 DIAGNOSIS — I10 Essential (primary) hypertension: Secondary | ICD-10-CM | POA: Insufficient documentation

## 2012-08-26 DIAGNOSIS — Z7982 Long term (current) use of aspirin: Secondary | ICD-10-CM | POA: Insufficient documentation

## 2012-08-26 DIAGNOSIS — I2510 Atherosclerotic heart disease of native coronary artery without angina pectoris: Secondary | ICD-10-CM | POA: Insufficient documentation

## 2012-08-26 DIAGNOSIS — Z8719 Personal history of other diseases of the digestive system: Secondary | ICD-10-CM | POA: Insufficient documentation

## 2012-08-26 DIAGNOSIS — W010XXA Fall on same level from slipping, tripping and stumbling without subsequent striking against object, initial encounter: Secondary | ICD-10-CM | POA: Insufficient documentation

## 2012-08-26 MED ORDER — FENTANYL CITRATE 0.05 MG/ML IJ SOLN
100.0000 ug | Freq: Once | INTRAMUSCULAR | Status: AC
  Administered 2012-08-26: 100 ug via INTRAVENOUS

## 2012-08-26 MED ORDER — SODIUM CHLORIDE 0.9 % IV BOLUS (SEPSIS)
500.0000 mL | Freq: Once | INTRAVENOUS | Status: AC
  Administered 2012-08-26: 500 mL via INTRAVENOUS

## 2012-08-26 MED ORDER — FENTANYL CITRATE 0.05 MG/ML IJ SOLN: 50.0000 ug | Freq: Once | INTRAMUSCULAR | Status: DC

## 2012-08-26 MED ORDER — SODIUM CHLORIDE 0.9 % IV SOLN
INTRAVENOUS | Status: DC
  Administered 2012-08-26: 09:00:00 via INTRAVENOUS

## 2012-08-26 MED ORDER — FENTANYL CITRATE 0.05 MG/ML IJ SOLN
50.0000 ug | Freq: Once | INTRAMUSCULAR | Status: DC
  Filled 2012-08-26: qty 2

## 2012-08-26 MED ORDER — PROPOFOL 10 MG/ML IV EMUL
16.0000 mL | INTRAVENOUS | Status: DC
  Administered 2012-08-26 (×2): 40 mg via INTRAVENOUS
  Filled 2012-08-26: qty 100

## 2012-08-26 MED ORDER — ACETAMINOPHEN 500 MG PO TABS: 500.0000 mg | ORAL_TABLET | Freq: Four times a day (QID) | ORAL | Status: DC | PRN

## 2012-08-26 NOTE — ED Provider Notes (Addendum)
Medical screening examination/treatment/procedure(s) were conducted as a shared visit with non-physician practitioner(s) and myself.  I personally evaluated the patient during the encounter.  Chief complaint right shoulder pain  79 year old female felt well this morning was walking into church and tripped on a carpet and fell injuring her right shoulder with obvious deformity to right shoulder with slight decreased sensation over the deltoid region consistent with some axillary neuropraxia, she has no distal weakness or numbness to the right hand and she is no injury to her head neck back and also has no chest pain shortness breath abdominal pain or injury to her left arm or legs. She has isolated right shoulder injury only with severe pain and localized without radiation or associated symptoms other than a slight numbness to the axillary nerve distribution region. Her last oral intake was 6:00 this morning.  ROS: 10 systems reviewed and negative except as noted in history of illness  PMH/Soc Hx nurses notes and vital signs reviewed and considered  General appearance: Awake alert nontoxic pleasant cooperative 79 white female normal speech Airways patent and maintained Cervical spine nontender Lungs clear to auscultation unlabored bilaterally no wheezes rales retractions rhonchi or accessory muscle usage Cardiac regular rate and rhythm without audible murmur GI: abdomen is soft and nontender with bowel sounds present no organomegaly or masses palpated Musculoskeletal: left arm and both legs are nontender right arm is nontender at the clavicle or deformity present at the right shoulder slight decreased light touch over the deltoid region consistent with axillary nerve neuropraxia right elbow forearm wrist and hand are nontender capillary refill less than 2 seconds right-handed all digits normal light touch entire right hand with intact strength and light touch in the distributions of the median radial  and ulnar nerve function Neuro/Psych: Awake alert calm cooperative pleasant follows commands well  Pre-sedation "time-out," performed.  Provider confirms review of the nurses' note, allergies, medications, PMH, pre-induction vital signs with pulse oximetry, pain level, pertinent labs, imaging, and ECG (as applicable) and patient condition satisfactory for commencing with order for sedation and procedure.  Agents used in sedation:  propofol.  Patient tolerated reduction of dislocated right shoulder (closed) procedure and procedural sedation component as expected without apparent immediate complications.  Physician confirms procedural medication orders as administered, patient was assessed by physician post-procedure, and confirms post-sedation plan of care and disposition.  Hurman Horn, MD 08/30/12 2358  Sedation time 15 minutes.   Hurman Horn, MD 10/14/12 [id number]

## 2012-08-26 NOTE — ED Notes (Signed)
md at bedside  Pt alert and oriented x4. Respirations even and unlabored, bilateral symmetrical rise and fall of chest. Skin warm and dry. In no acute distress. Denies needs.   

## 2012-08-26 NOTE — ED Notes (Signed)
Pt to xray

## 2012-08-26 NOTE — ED Notes (Signed)
PA at bedside.

## 2012-08-26 NOTE — ED Provider Notes (Signed)
History     CSN: [id number]  Arrival date & time 08/26/12  0865   None     Chief Complaint  Patient presents with  . Shoulder Injury    (Consider location/radiation/quality/duration/timing/severity/associated sxs/prior treatment) HPI  79 year old female presents for evaluations of right shoulder injury. Patient reports she was walking into church today, actually tripped over the carpet, fell to the right side and hit her shoulder against the carpet floor. She denies hitting head or loss of consciousness. She reports acute onset of sharp throbbing sensation to the shoulder, nonradiating, worsening with movement, 9/10. Denies any significant neck pain, elbow, wrist or back pain. Denies lightheadedness, dizziness, cp, sob.  Denies any numbness or weakness. No precipitating sxs prior to fall.  Denies prior injury to the same shoulder. No specific treatment tried. The incident happened 30 minutes ago.  Past Medical History  Diagnosis Date  . Diverticulosis of colon   . Arthritis   . Hypertension   . Hyperlipidemia   . CVD (cerebrovascular disease)   . CAD (coronary artery disease)     Past Surgical History  Procedure Laterality Date  . Abdominal hysterectomy  1984  . Replacement total knee  2007    left  . Total hip arthroplasty  2010    Dr. Turner Daniels, right    Family History  Problem Relation Age of Onset  . Diabetes Mother   . Hypertension Father   . Kidney disease Father   . Heart attack Sister   . Heart attack Brother   . Colon cancer Neg Hx   . Stomach cancer Neg Hx   . Esophageal cancer Neg Hx   . Rectal cancer Neg Hx     History  Substance Use Topics  . Smoking status: Former Smoker    Quit date: 11/01/1980  . Smokeless tobacco: Never Used  . Alcohol Use: Yes     Comment: occasional glass wine    OB History   Grav Para Term Preterm Abortions TAB SAB Ect Mult Living                  Review of Systems  Constitutional: Negative for fever.       10  Systems reviewed and all are negative for acute change except as noted in the HPI.   Eyes: Negative for visual disturbance.  Respiratory: Negative for chest tightness and shortness of breath.   Cardiovascular: Negative for chest pain.  Musculoskeletal: Negative for gait problem.  Skin: Negative for rash and wound.  Neurological: Negative for numbness and headaches.    Allergies  Codeine and Hydrocodone  Home Medications   Current Outpatient Rx  Name  Route  Sig  Dispense  Refill  . aspirin 81 MG tablet   Oral   Take 81 mg by mouth every morning.          . hydrochlorothiazide (HYDRODIURIL) 25 MG tablet   Oral   Take 50 mg by mouth every morning.         Marland Kitchen ibuprofen (ADVIL,MOTRIN) 200 MG tablet   Oral   Take 200 mg by mouth every morning.         . metoprolol tartrate (LOPRESSOR) 25 MG tablet   Oral   Take 25 mg by mouth 2 (two) times daily.         . Multiple Vitamin (MULTIVITAMIN WITH MINERALS) TABS   Oral   Take 1 tablet by mouth every morning.         Marland Kitchen  Potassium Gluconate 550 MG TABS   Oral   Take 1 tablet by mouth every morning. OTC potassium supplement         . simvastatin (ZOCOR) 40 MG tablet   Oral   Take 40 mg by mouth every evening.           BP 160/79  Pulse 64  Resp 18  SpO2 100%  Physical Exam  Nursing note and vitals reviewed. Constitutional: She is oriented to person, place, and time. She appears well-developed and well-nourished. No distress.  HENT:  Head: Normocephalic and atraumatic.  Eyes: Conjunctivae are normal.  Neck: Normal range of motion. Neck supple.  Cardiovascular: Normal rate and regular rhythm.   Pulmonary/Chest: Effort normal and breath sounds normal. No respiratory distress.  Abdominal: Soft.  Musculoskeletal: She exhibits tenderness (R shoulder: moderate tenderness to anterio-lateral aspect of shoulder with evidence of shoulder dislocation.  Decreased ROM due to pain, sensation intact.  No break in skin.).  She exhibits no edema.       Right shoulder: She exhibits decreased range of motion, tenderness, bony tenderness and deformity. She exhibits no swelling, no effusion and no crepitus.       Right elbow: Normal.      Right wrist: Normal.       Cervical back: Normal.  Neurological: She is alert and oriented to person, place, and time.  Mild subjective paresthesia to R axillary nerve distribution.  Normal sensation to radial/median/ulnar nerve distribution.  Normal grip bilat.    Skin: Skin is warm. No rash noted.  Psychiatric: She has a normal mood and affect.    ED Course  Procedures (including critical care time)  7:04 AM Patient with mechanical fall injuring her right shoulder. She has evidence of R shoulder dislocation. Sensation is intact. No other injury. Patient is currently stable. Pain medication given, x-ray ordered.  7:32 AM Care discussed with attending.  Will perform conscious sedation for R shoulder reduction.  Pt is aware of plan.  Pt will sign consent form for procedural sedation.    8:59 AM Successful procedural sedation and reduction of R shoulder performed by Dr. Fonnie Jarvis.  Pt tolerates well.  Post-reduction film ordered.  Shoulder immobilizer applied.    10:24 AM Pt's post-reduction xray shows evidence of relocation with normal anatomy.  Pt is currently pain free.  Care instruction given.  Return precaution given. All questions answered to pt's satisfaction.    Labs Reviewed - No data to display Dg Shoulder Right  08/26/2012  *RADIOLOGY REPORT*  Clinical Data: Fall, right shoulder pain  RIGHT SHOULDER - 2+ VIEW  Comparison: None.  Findings: Anterior/inferior right shoulder dislocation.  No fracture is seen.  Visualized right lung is clear.  IMPRESSION: Anterior right shoulder dislocation.   Original Report Authenticated By: Charline Bills, M.D.    Dg Shoulder Right Port  08/26/2012  *RADIOLOGY REPORT*  Clinical Data: Post right shoulder reduction  PORTABLE RIGHT  SHOULDER - 2+ VIEW  Comparison: None.  Findings: Interval relocation of the right shoulder.  No fracture is seen.  Mild glenohumeral and acromioclavicular degenerative changes.  Visualized right lung is clear.  IMPRESSION: Interval relocation of the right shoulder.  No fracture is seen.   Original Report Authenticated By: Charline Bills, M.D.      1. Shoulder dislocation, right, initial encounter       MDM  BP 141/63  Pulse 61  Temp(Src) 97.6 F (36.4 C) (Oral)  Resp 14  SpO2 99%  I have reviewed  nursing notes and vital signs. I personally reviewed the imaging tests through PACS system  I reviewed available ER/hospitalization records thought the EMR         Fayrene Helper, PA-C 08/26/12 1025

## 2012-08-26 NOTE — ED Notes (Signed)
Pt escorted to discharge window. Pt verbalized understanding discharge instructions. In no acute distress.  

## 2012-08-26 NOTE — ED Notes (Signed)
Pt states while walking into church she tripped on carpet falling onto R shoulder, obvious deformity noted.

## 2012-09-17 ENCOUNTER — Telehealth: Payer: Self-pay | Admitting: Internal Medicine

## 2012-09-17 NOTE — Telephone Encounter (Signed)
Patient Information:  Caller Name: [name]  Phone: [phone]  Patient: Bettey, Muraoka  Gender: Female  DOB: 25-Jan-1933  Age: 79 Years  PCP: Birdie Sons (Adults only)  Office Follow Up:  Does the office need to follow up with this patient?: No  Instructions For The Office: N/A  RN Note:  Advised care giver to hang up and dial 911 so the patient can be assessed as quickly as possible. She agrees to do this at this time.  Symptoms  Reason For Call & Symptoms: Appt scheduled on 09/18/12 at 2:30pm. Reports patient dislocated her right shoulder in a fall on Easter Sunday. Reports patient is loosing her balance and slurring her words.  Reviewed Health History In EMR: Yes  Reviewed Medications In EMR: Yes  Reviewed Allergies In EMR: Yes  Reviewed Surgeries / Procedures: Yes  Date of Onset of Symptoms: 08/26/2012  Guideline(s) Used:  Weakness (Generalized) and Fatigue  Disposition Per Guideline:   Call EMS 911 Now  Reason For Disposition Reached:   Difficult to awaken or acting confused (e.g., disoriented, slurred speech)  Advice Given:  N/A  Patient Will Follow Care Advice:  YES

## 2012-09-18 ENCOUNTER — Ambulatory Visit (INDEPENDENT_AMBULATORY_CARE_PROVIDER_SITE_OTHER): Payer: Medicare Other | Admitting: Family

## 2012-09-18 ENCOUNTER — Encounter: Payer: Self-pay | Admitting: Family

## 2012-09-18 VITALS — BP 130/76 | HR 78 | Wt 170.0 lb

## 2012-09-18 DIAGNOSIS — R4789 Other speech disturbances: Secondary | ICD-10-CM

## 2012-09-18 DIAGNOSIS — R4781 Slurred speech: Secondary | ICD-10-CM

## 2012-09-18 DIAGNOSIS — S43004S Unspecified dislocation of right shoulder joint, sequela: Secondary | ICD-10-CM

## 2012-09-18 DIAGNOSIS — IMO0002 Reserved for concepts with insufficient information to code with codable children: Secondary | ICD-10-CM

## 2012-09-18 NOTE — Patient Instructions (Addendum)
Dislocation or Subluxation Dislocation of a joint occurs when ends of two or more adjacent bones no longer touch each other. A subluxation is a minor form of a dislocation, in which two or more adjacent bones are no longer properly aligned. The most common joints susceptible to a dislocation are the shoulder, kneecap, and fingers.  SYMPTOMS   Sudden pain at the time of injury.  Noticeable deformity in the area of the joint.  Limited range of motion. CAUSES   Usually a traumatic injury that stretches or tears ligaments that surround a joint and hold the bones together.  Condition present at birth (congenital) in which the joint surfaces are shallow or abnormally formed.  Joint disease such as arthritis or other diseases of ligaments and tissues around a joint. RISK INCREASES WITH:  Repeated injury to a joint.  Previous dislocation of a joint.  Contact sports (football, rugby, hockey, lacrosse) or sports that require repetitive overhead arm motion (throwing, swimming, volleyball).  Rheumatoid arthritis.  Congenital joint condition. PREVENTION  Warm up and stretch properly before activity.  Maintain physical fitness:  Joint flexibility.  Muscle strength and endurance.  Cardiovascular fitness.  Wear proper protective equipment and ensure correct fit.  Learn and use proper technique. PROGNOSIS  This condition is usually curable with prompt treatment. After the dislocation has been put back in place, the joint may require immobilization with a cast, splint, or sling for 2 to 6 weeks, often followed by strength and stretching exercises that may be performed at home or with a therapist. RELATED COMPLICATIONS   Damage to nearby nerves or major blood vessels, causing numbness, coldness, or paleness.  Recurrent injury to the joint.  Arthritis of affected joint.  Fracture of joint. TREATMENT Treatment initially involves realigning the bones (reduction) of the joint.  Reductions should only be performed by someone who is trained in the procedure. After the joint is reduced, medicine and ice should be used to reduce pain and inflammation. The joint may be immobilized to allow for the muscles and ligaments to heal. If a joint is subjected to recurrent dislocations, surgery may be necessary to tighten or replace injured structures. After surgery, stretching and strengthening exercises may be required. These may be performed at home or with a therapist. MEDICATION   Patients may require medicine to help them relax (sedative) or muscle relaxants in order to reduce the joint.  If pain medicine is necessary, nonsteroidal anti-inflammatory medicines, such as aspirin and ibuprofen, or other minor pain relievers, such as acetaminophen, are often recommended.  Do not take pain medicine for 7 days before surgery.  Prescription pain relievers may be necessary. Use only as directed and only as much as you need. SEEK MEDICAL CARE IF:   Symptoms get worse or do not improve despite treatment.  You have difficulty moving a joint after injury.  Any extremity becomes numb, pale, or cool after injury. This is an emergency.  Dislocations or subluxations occur repeatedly. Document Released: 04/25/2005 Document Revised: 07/18/2011 Document Reviewed: 08/07/2008 [facility] Patient Information 2013 [place], [place].

## 2012-09-18 NOTE — Progress Notes (Signed)
Subjective:    Patient ID: Felicia Cross, female    DOB: Jun 15, 1932, 79 y.o.   MRN: [id number]  HPI 79 year old African American female, nonsmoker, patient of Dr. Cato Mulligan is in today as a emergency department followup. She was seen on 08/26/2012 after a fall at church that resulted in a dislocated right shoulder. She underwent conscious sedation and the right shoulder was relocated. She's been seeing orthopedics since that time and was prescribed exercises that she has not been doing. She reports that the orthopedist expressed disappointment and has ordered physical therapy for her. She rates the pain about a 6/10 on average however it waxes and wanes in intensity.  She reports her daughter that lives in a different state called to make this appointment today verbalizing that she has had slurred speech over the last 4 days. Patient denies any slurred speech. Her daughter that lives locally has not witnessed either. She will like to have that checked today. Denies any lightheadedness, dizziness, chest pain, palpitations, shortness of breath or edema. No blurred vision or double vision.    Review of Systems  Constitutional: Negative.   Respiratory: Negative.   Cardiovascular: Negative.   Gastrointestinal: Negative.   Musculoskeletal: Positive for arthralgias.       Right shoulder pain  Skin: Negative.   Allergic/Immunologic: Negative.   Neurological: Negative.   Psychiatric/Behavioral: Negative.    Past Medical History  Diagnosis Date  . Diverticulosis of colon   . Arthritis   . Hypertension   . Hyperlipidemia   . CVD (cerebrovascular disease)   . CAD (coronary artery disease)     History   Social History  . Marital Status: Married    Spouse Name: N/A    Number of Children: N/A  . Years of Education: N/A   Occupational History  . Not on file.   Social History Main Topics  . Smoking status: Former Smoker    Quit date: 11/01/1980  . Smokeless tobacco: Never Used  .  Alcohol Use: Yes     Comment: occasional glass wine  . Drug Use: No  . Sexually Active: Not on file   Other Topics Concern  . Not on file   Social History Narrative  . No narrative on file    Past Surgical History  Procedure Laterality Date  . Abdominal hysterectomy  1984  . Replacement total knee  2007    left  . Total hip arthroplasty  2010    Dr. Turner Daniels, right    Family History  Problem Relation Age of Onset  . Diabetes Mother   . Hypertension Father   . Kidney disease Father   . Heart attack Sister   . Heart attack Brother   . Colon cancer Neg Hx   . Stomach cancer Neg Hx   . Esophageal cancer Neg Hx   . Rectal cancer Neg Hx     Allergies  Allergen Reactions  . Codeine     REACTION: gi upset  . Hydrocodone     REACTION: gi upset    Current Outpatient Prescriptions on File Prior to Visit  Medication Sig Dispense Refill  . acetaminophen (TYLENOL) 500 MG tablet Take 1 tablet (500 mg total) by mouth every 6 (six) hours as needed for pain.  30 tablet  0  . aspirin 81 MG tablet Take 81 mg by mouth every morning.       . hydrochlorothiazide (HYDRODIURIL) 25 MG tablet Take 50 mg by mouth every morning.      Marland Kitchen  ibuprofen (ADVIL,MOTRIN) 200 MG tablet Take 200 mg by mouth every morning.      . metoprolol tartrate (LOPRESSOR) 25 MG tablet Take 25 mg by mouth 2 (two) times daily.      . Multiple Vitamin (MULTIVITAMIN WITH MINERALS) TABS Take 1 tablet by mouth every morning.      . Potassium Gluconate 550 MG TABS Take 1 tablet by mouth every morning. OTC potassium supplement      . simvastatin (ZOCOR) 40 MG tablet Take 40 mg by mouth every evening.       No current facility-administered medications on file prior to visit.    BP 130/76  Pulse 78  Wt 170 lb (77.111 kg)  BMI 29.17 kg/m2  SpO2 98%chart    Objective:   Physical Exam  Constitutional: She is oriented to person, place, and time. She appears well-developed and well-nourished.  Neck: Normal range of motion.  Neck supple.  Cardiovascular: Normal rate, regular rhythm and normal heart sounds.   Pulmonary/Chest: Effort normal and breath sounds normal.  Musculoskeletal: Normal range of motion.  Right shoulder is in a sling. Nontender to palpation. No swelling.   Neurological: She is alert and oriented to person, place, and time.  Skin: Skin is warm and dry.  Psychiatric: She has a normal mood and affect.          Assessment & Plan:  1. Right shoulder dislocation C. Slurred speech assessment: 1.:  Assessment:  1. Right Shoulder Dislocation 2. Slurred Speech 3. Hypertension  Plan:  To rule out any neurological concerns with regards to slurred speech, we'll order a CT scan of the head without contrast. Will notify patient pending results. Strongly encouraged exercises for the right shoulder as prescribed orthopedics. Continue physical therapy. Continue current medications. Keep followup appointment with PCP as scheduled and sooner as needed.

## 2012-09-24 ENCOUNTER — Ambulatory Visit: Payer: Medicare Other | Attending: Orthopedic Surgery | Admitting: Physical Therapy

## 2012-09-24 DIAGNOSIS — Z96659 Presence of unspecified artificial knee joint: Secondary | ICD-10-CM | POA: Insufficient documentation

## 2012-09-24 DIAGNOSIS — Z96649 Presence of unspecified artificial hip joint: Secondary | ICD-10-CM | POA: Insufficient documentation

## 2012-09-24 DIAGNOSIS — M25619 Stiffness of unspecified shoulder, not elsewhere classified: Secondary | ICD-10-CM | POA: Insufficient documentation

## 2012-09-24 DIAGNOSIS — IMO0001 Reserved for inherently not codable concepts without codable children: Secondary | ICD-10-CM | POA: Insufficient documentation

## 2012-09-24 DIAGNOSIS — M25519 Pain in unspecified shoulder: Secondary | ICD-10-CM | POA: Insufficient documentation

## 2012-09-26 ENCOUNTER — Ambulatory Visit: Payer: Medicare Other | Admitting: Physical Therapy

## 2012-09-28 ENCOUNTER — Ambulatory Visit (INDEPENDENT_AMBULATORY_CARE_PROVIDER_SITE_OTHER)
Admission: RE | Admit: 2012-09-28 | Discharge: 2012-09-28 | Disposition: A | Discharge status: Home / Self Care | Payer: Medicare Other | Source: Ambulatory Visit | Attending: Family | Admitting: Family

## 2012-09-28 ENCOUNTER — Ambulatory Visit: Payer: Medicare Other | Admitting: Physical Therapy

## 2012-09-28 DIAGNOSIS — R4781 Slurred speech: Secondary | ICD-10-CM

## 2012-09-28 DIAGNOSIS — R4789 Other speech disturbances: Secondary | ICD-10-CM

## 2012-10-02 ENCOUNTER — Encounter: Payer: Medicare Other | Admitting: Physical Therapy

## 2012-10-03 ENCOUNTER — Ambulatory Visit: Payer: Medicare Other | Admitting: Physical Therapy

## 2012-10-05 ENCOUNTER — Ambulatory Visit: Payer: Medicare Other | Admitting: Physical Therapy

## 2012-10-08 ENCOUNTER — Ambulatory Visit: Payer: Medicare Other | Attending: Orthopedic Surgery | Admitting: Physical Therapy

## 2012-10-08 DIAGNOSIS — IMO0001 Reserved for inherently not codable concepts without codable children: Secondary | ICD-10-CM | POA: Insufficient documentation

## 2012-10-08 DIAGNOSIS — M25619 Stiffness of unspecified shoulder, not elsewhere classified: Secondary | ICD-10-CM | POA: Insufficient documentation

## 2012-10-08 DIAGNOSIS — M25519 Pain in unspecified shoulder: Secondary | ICD-10-CM | POA: Insufficient documentation

## 2012-10-10 ENCOUNTER — Ambulatory Visit: Payer: Medicare Other | Admitting: Physical Therapy

## 2012-10-17 ENCOUNTER — Encounter: Payer: Medicare Other | Admitting: Physical Therapy

## 2012-10-19 ENCOUNTER — Encounter: Payer: Medicare Other | Admitting: Physical Therapy

## 2012-10-22 ENCOUNTER — Ambulatory Visit: Payer: Medicare Other | Admitting: Physical Therapy

## 2012-10-24 ENCOUNTER — Ambulatory Visit: Payer: Medicare Other | Admitting: Physical Therapy

## 2012-10-26 ENCOUNTER — Encounter: Payer: Medicare Other | Admitting: Physical Therapy

## 2012-10-29 ENCOUNTER — Ambulatory Visit: Payer: Medicare Other | Admitting: Physical Therapy

## 2012-10-31 ENCOUNTER — Ambulatory Visit: Payer: Medicare Other | Admitting: Physical Therapy

## 2013-05-27 ENCOUNTER — Other Ambulatory Visit: Payer: Self-pay | Admitting: Internal Medicine

## 2013-05-29 ENCOUNTER — Other Ambulatory Visit: Payer: Self-pay | Admitting: Internal Medicine

## 2013-05-29 NOTE — Telephone Encounter (Signed)
Pt aware she needs appt for refills. Pt has made appt for next week. W/ padonda  Can you refill 30 days until she gets in her? [facility] [address] hydrochlorothiazide (HYDRODIURIL) 25 MG tablet

## 2013-06-05 ENCOUNTER — Ambulatory Visit (INDEPENDENT_AMBULATORY_CARE_PROVIDER_SITE_OTHER): Payer: Medicare Other | Admitting: Family

## 2013-06-05 ENCOUNTER — Encounter: Payer: Self-pay | Admitting: Family

## 2013-06-05 VITALS — BP 110/68 | HR 73 | Wt 170.0 lb

## 2013-06-05 DIAGNOSIS — I10 Essential (primary) hypertension: Secondary | ICD-10-CM

## 2013-06-05 DIAGNOSIS — E785 Hyperlipidemia, unspecified: Secondary | ICD-10-CM

## 2013-06-05 LAB — BASIC METABOLIC PANEL
BUN: 20 mg/dL (ref 6–23)
CALCIUM: 9.5 mg/dL (ref 8.4–10.5)
CO2: 32 mEq/L (ref 19–32)
Chloride: 101 mEq/L (ref 96–112)
Creatinine, Ser: 1.2 mg/dL (ref 0.4–1.2)
GFR: 56.93 mL/min — AB (ref >60.00)
GLUCOSE: 82 mg/dL (ref 70–99)
Potassium: 3.6 mEq/L (ref 3.5–5.1)
Sodium: 138 mEq/L (ref 135–145)

## 2013-06-05 MED ORDER — HYDROCHLOROTHIAZIDE 25 MG PO TABS: 25.0000 mg | ORAL_TABLET | Freq: Every day | ORAL | Status: DC

## 2013-06-05 NOTE — Progress Notes (Signed)
Subjective:    Patient ID: Felicia Cross, female    DOB: [birth year], 80 y.o.   MRN: [id number]  HPI 80 y.o. Black female who presents today for medication follow up. Pt has history of hypertension, hyperlipidemia, osteoporsis and CAD. Pt was started on hydrochlorothiazide 50mg  daily on the 21st. Patient states that she is having "cramping" and her blood pressure was 98/62 on presentation. She denies light headedness, fever, chills, fatigue.     Review of Systems  Constitutional: Negative.   HENT: Negative.   Eyes: Negative.   Respiratory: Negative.   Cardiovascular: Negative.   Gastrointestinal: Negative.   Endocrine: Negative.   Genitourinary: Negative.   Musculoskeletal:       Acknowledges occasional leg cramps.   Skin: Negative.   Allergic/Immunologic: Negative.   Neurological: Negative.   Hematological: Negative.   Psychiatric/Behavioral: Negative.        Past Medical History  Diagnosis Date  . Diverticulosis of colon   . Arthritis   . Hypertension   . Hyperlipidemia   . CVD (cerebrovascular disease)   . CAD (coronary artery disease)     History   Social History  . Marital Status: Married    Spouse Name: N/A    Number of Children: N/A  . Years of Education: N/A   Occupational History  . Not on file.   Social History Main Topics  . Smoking status: Former Smoker    Quit date: 11/01/1980  . Smokeless tobacco: Never Used  . Alcohol Use: Yes     Comment: occasional glass wine  . Drug Use: No  . Sexual Activity: Not on file   Other Topics Concern  . Not on file   Social History Narrative  . No narrative on file    Past Surgical History  Procedure Laterality Date  . Abdominal hysterectomy  1984  . Replacement total knee  2007    left  . Total hip arthroplasty  2010    Dr. Turner Danielsowan, right    Family History  Problem Relation Age of Onset  . Diabetes Mother   . Hypertension Father   . Kidney disease Father   . Heart attack Sister   . Heart  attack Brother   . Colon cancer Neg Hx   . Stomach cancer Neg Hx   . Esophageal cancer Neg Hx   . Rectal cancer Neg Hx     Allergies  Allergen Reactions  . Codeine     REACTION: gi upset  . Hydrocodone     REACTION: gi upset    Current Outpatient Prescriptions on File Prior to Visit  Medication Sig Dispense Refill  . acetaminophen (TYLENOL) 500 MG tablet Take 1 tablet (500 mg total) by mouth every 6 (six) hours as needed for pain.  30 tablet  0  . aspirin 81 MG tablet Take 81 mg by mouth every morning.       Marland Kitchen. ibuprofen (ADVIL,MOTRIN) 200 MG tablet Take 200 mg by mouth every morning.      . metoprolol tartrate (LOPRESSOR) 25 MG tablet Take 25 mg by mouth 2 (two) times daily.      . Multiple Vitamin (MULTIVITAMIN WITH MINERALS) TABS Take 1 tablet by mouth every morning.      . Potassium Gluconate 550 MG TABS Take 1 tablet by mouth every morning. OTC potassium supplement      . simvastatin (ZOCOR) 40 MG tablet Take 40 mg by mouth every evening.       No current  facility-administered medications on file prior to visit.    BP 110/68  Pulse 73  Wt 170 lb (77.111 kg)chart Objective:   Physical Exam  Constitutional: She is oriented to person, place, and time. She appears well-developed and well-nourished. She is active.  HENT:  Head: Normocephalic.  Cardiovascular: Normal rate, regular rhythm and normal heart sounds.   Pulmonary/Chest: Effort normal and breath sounds normal.  Abdominal: Soft. Normal appearance and bowel sounds are normal.  Neurological: She is alert and oriented to person, place, and time.  Skin: Skin is warm, dry and intact.  Psychiatric: She has a normal mood and affect. Her speech is normal and behavior is normal.          Assessment & Plan:  80 y.o. Black female presents today for medication follow up.  Hypertension: Due to patients cramping and blood pressure, will decrease hydrochlorothiazide 25mg  Qday  - BMP to monitor electrolyte.  Education: low  sodium diet, exercise daily, self monitor blood pressure daily.  Follow up as needed, will contact with lab values.

## 2013-06-05 NOTE — Patient Instructions (Signed)
Exercise to Stay Healthy Exercise helps you become and stay healthy. EXERCISE IDEAS AND TIPS Choose exercises that:  You enjoy.  Fit into your day. You do not need to exercise really hard to be healthy. You can do exercises at a slow or medium level and stay healthy. You can:  Stretch before and after working out.  Try yoga, Pilates, or tai chi.  Lift weights.  Walk fast, swim, jog, run, climb stairs, bicycle, dance, or rollerskate.  Take aerobic classes. Exercises that burn about 150 calories:  Running 1  miles in 15 minutes.  Playing volleyball for 45 to 60 minutes.  Washing and waxing a car for 45 to 60 minutes.  Playing touch football for 45 minutes.  Walking 1  miles in 35 minutes.  Pushing a stroller 1  miles in 30 minutes.  Playing basketball for 30 minutes.  Raking leaves for 30 minutes.  Bicycling 5 miles in 30 minutes.  Walking 2 miles in 30 minutes.  Dancing for 30 minutes.  Shoveling snow for 15 minutes.  Swimming laps for 20 minutes.  Walking up stairs for 15 minutes.  Bicycling 4 miles in 15 minutes.  Gardening for 30 to 45 minutes.  Jumping rope for 15 minutes.  Washing windows or floors for 45 to 60 minutes. Document Released: 05/28/2010 Document Revised: 07/18/2011 Document Reviewed: 05/28/2010 ExitCare Patient Information 2014 [employer]. Sodium-Controlled Diet Sodium is a mineral. It is found in many foods. Sodium may be found naturally or added during the making of a food. The most common form of sodium is salt, which is made up of sodium and chloride. Reducing your sodium intake involves changing your eating habits. The following guidelines will help you reduce the sodium in your diet:  Stop using the salt shaker.  Use salt sparingly in cooking and baking.  Substitute with sodium-free seasonings and spices.  Do not use a salt substitute (potassium chloride) without your caregiver's permission.  Include a variety of  fresh, unprocessed foods in your diet.  Limit the use of processed and convenience foods that are high in sodium. USE THE FOLLOWING FOODS SPARINGLY: Breads/Starches  Commercial bread stuffing, commercial pancake or waffle mixes, coating mixes. Waffles. Croutons. Prepared (boxed or frozen) potato, rice, or noodle mixes that contain salt or sodium. Salted French fries or hash browns. Salted popcorn, breads, crackers, chips, or snack foods. Vegetables  Vegetables canned with salt or prepared in cream, butter, or cheese sauces. Sauerkraut. Tomato or vegetable juices canned with salt.  Fresh vegetables are allowed if rinsed thoroughly. Fruit  Fruit is okay to eat. Meat and Meat Substitutes  Salted or smoked meats, such as bacon or Canadian bacon, chipped or corned beef, hot dogs, salt pork, luncheon meats, pastrami, ham, or sausage. Canned or smoked fish, poultry, or meat. Processed cheese or cheese spreads, blue or Roquefort cheese. Battered or frozen fish products. Prepared spaghetti sauce. Baked beans. Reuben sandwiches. Salted nuts. Caviar. Milk  Limit buttermilk to 1 cup per week. Soups and Combination Foods  Bouillon cubes, canned or dried soups, broth, consomm. Convenience (frozen or packaged) dinners with more than 600 mg sodium. Pot pies, pizza, Asian food, fast food cheeseburgers, and specialty sandwiches. Desserts and Sweets  Regular (salted) desserts, pie, commercial fruit snack pies, commercial snack cakes, canned puddings.  Eat desserts and sweets in moderation. Fats and Oils  Gravy mixes or canned gravy. No more than 1 to 2 tbs of salad dressing. Chip dips.  Eat fats and oils in moderation.   Beverages  See those listed under the vegetables and milk groups. Condiments  Ketchup, mustard, meat sauces, salsa, regular (salted) and lite soy sauce or mustard. Dill pickles, olives, meat tenderizer. Prepared horseradish or pickle relish. Dutch-processed cocoa. Baking powder or  baking soda used medicinally. Worcestershire sauce. "Light" salt. Salt substitute, unless approved by your caregiver. Document Released: 10/15/2001 Document Revised: 07/18/2011 Document Reviewed: 05/18/2009 ExitCare Patient Information 2014 [employer].  

## 2013-06-06 ENCOUNTER — Other Ambulatory Visit: Payer: Self-pay

## 2013-06-06 DIAGNOSIS — Z1231 Encounter for screening mammogram for malignant neoplasm of breast: Secondary | ICD-10-CM

## 2013-06-10 ENCOUNTER — Telehealth: Payer: Self-pay | Admitting: Internal Medicine

## 2013-06-10 NOTE — Telephone Encounter (Signed)
Relevant patient education mailed to patient.  

## 2013-06-26 ENCOUNTER — Ambulatory Visit: Payer: Medicare Other

## 2013-07-24 ENCOUNTER — Ambulatory Visit: Payer: Medicare Other | Admitting: Cardiology

## 2013-07-26 ENCOUNTER — Ambulatory Visit (INDEPENDENT_AMBULATORY_CARE_PROVIDER_SITE_OTHER): Payer: Medicare Other | Admitting: Cardiology

## 2013-07-26 ENCOUNTER — Encounter: Payer: Self-pay | Admitting: *Deleted

## 2013-07-26 ENCOUNTER — Encounter: Payer: Self-pay | Admitting: Cardiology

## 2013-07-26 VITALS — BP 144/90 | HR 66 | Ht 64.0 in | Wt 169.1 lb

## 2013-07-26 DIAGNOSIS — I2510 Atherosclerotic heart disease of native coronary artery without angina pectoris: Secondary | ICD-10-CM

## 2013-07-26 LAB — LIPID PANEL
CHOL/HDL RATIO: 4
Cholesterol: 198 mg/dL (ref 0–200)
HDL: 55.4 mg/dL (ref >39.00)
LDL Cholesterol: 131 mg/dL — ABNORMAL HIGH (ref 0–99)
TRIGLYCERIDES: 59 mg/dL (ref 0.0–149.0)
VLDL: 11.8 mg/dL (ref 0.0–40.0)

## 2013-07-26 LAB — BASIC METABOLIC PANEL
BUN: 17 mg/dL (ref 6–23)
CO2: 32 mEq/L (ref 19–32)
Calcium: 9.6 mg/dL (ref 8.4–10.5)
Chloride: 101 mEq/L (ref 96–112)
Creatinine, Ser: 1 mg/dL (ref 0.4–1.2)
GFR: 70.52 mL/min (ref >60.00)
Glucose, Bld: 76 mg/dL (ref 70–99)
Potassium: 4.4 mEq/L (ref 3.5–5.1)
Sodium: 139 mEq/L (ref 135–145)

## 2013-07-26 LAB — HEPATIC FUNCTION PANEL
ALT: 12 U/L (ref 0–35)
AST: 23 U/L (ref 0–37)
Albumin: 3.9 g/dL (ref 3.5–5.2)
Alkaline Phosphatase: 44 U/L (ref 39–117)
Bilirubin, Direct: 0 mg/dL (ref 0.0–0.3)
Total Bilirubin: 0.7 mg/dL (ref 0.3–1.2)
Total Protein: 6.8 g/dL (ref 6.0–8.3)

## 2013-07-26 MED ORDER — SIMVASTATIN 40 MG PO TABS: 40.0000 mg | ORAL_TABLET | Freq: Every evening | ORAL | Status: DC

## 2013-07-26 MED ORDER — METOPROLOL TARTRATE 25 MG PO TABS: 25.0000 mg | ORAL_TABLET | Freq: Two times a day (BID) | ORAL | Status: DC

## 2013-07-26 NOTE — Assessment & Plan Note (Signed)
Continue aspirin and statin. Mild chest tightness. Schedule stress echocardiogram for risk stratification.

## 2013-07-26 NOTE — Patient Instructions (Signed)
Your physician wants you to follow-up in: ONE [facility] You will receive a reminder letter in the mail two months in advance. If you don't receive a letter, please call our office to schedule the follow-up appointment.   Your physician has requested that you have a stress echocardiogram. For further information please visit [email or web address]. Please follow instruction sheet as given.   Your physician recommends that you HAVE LAB WORK TODAY

## 2013-07-26 NOTE — Progress Notes (Signed)
      HPI: FU hypertension, hyperlipidemia and minimal CAD. She had a previous catheterization in August 2006, this showed minor disease in the diagonal and distal LAD. We also performed carotid Dopplers in Feb 2012 that showed 0-39% stenosis. F/U not deemed necessary as this had been stable over serial exams. Myoview on May 14, 2008; Ejection fraction was 66% and there was no ischemia or infarction. Since I last saw her in Feb 2014 she does not have dyspnea on exertion, orthopnea or PND. She occasionally has chest tightness for several seconds with pain in her left upper extremity. Not exertional.   Current Outpatient Prescriptions  Medication Sig Dispense Refill  . acetaminophen (TYLENOL) 500 MG tablet Take 1 tablet (500 mg total) by mouth every 6 (six) hours as needed for pain.  30 tablet  0  . aspirin 81 MG tablet Take 81 mg by mouth every morning.       . hydrochlorothiazide (HYDRODIURIL) 25 MG tablet Take 1 tablet (25 mg total) by mouth daily.  90 tablet  3  . ibuprofen (ADVIL,MOTRIN) 200 MG tablet Take 200 mg by mouth every morning.      . metoprolol tartrate (LOPRESSOR) 25 MG tablet Take 25 mg by mouth 2 (two) times daily.      . Multiple Vitamin (MULTIVITAMIN WITH MINERALS) TABS Take 1 tablet by mouth every morning.      . Potassium Gluconate 550 MG TABS Take 1 tablet by mouth every morning. OTC potassium supplement      . simvastatin (ZOCOR) 40 MG tablet Take 40 mg by mouth every evening.       No current facility-administered medications for this visit.     Past Medical History  Diagnosis Date  . Diverticulosis of colon   . Arthritis   . Hypertension   . Hyperlipidemia   . CVD (cerebrovascular disease)   . CAD (coronary artery disease)     Past Surgical History  Procedure Laterality Date  . Abdominal hysterectomy  1984  . Replacement total knee  2007    left  . Total hip arthroplasty  2010    Dr. Turner Danielsowan, right    History   Social History  . Marital Status:  Married    Spouse Name: N/A    Number of Children: N/A  . Years of Education: N/A   Occupational History  . Not on file.   Social History Main Topics  . Smoking status: Former Smoker    Quit date: 11/01/1980  . Smokeless tobacco: Never Used  . Alcohol Use: Yes     Comment: occasional glass wine  . Drug Use: No  . Sexual Activity: Not on file   Other Topics Concern  . Not on file   Social History Narrative  . No narrative on file    ROS: no fevers or chills, productive cough, hemoptysis, dysphasia, odynophagia, melena, hematochezia, dysuria, hematuria, rash, seizure activity, orthopnea, PND, pedal edema, claudication. Remaining systems are negative.  Physical Exam: Well-developed well-nourished in no acute distress.  Skin is warm and dry.  HEENT is normal.  Neck is supple.  Chest is clear to auscultation with normal expansion.  Cardiovascular exam is regular rate and rhythm.  Abdominal exam nontender or distended. No masses palpated. Extremities show no edema. neuro grossly intact  ECG sinus rhythm at a rate of 66. No ST changes.

## 2013-07-26 NOTE — Assessment & Plan Note (Signed)
Continue statin.check lipids and liver. 

## 2013-07-26 NOTE — Assessment & Plan Note (Signed)
Continue present medications.check potassium and renal function. 

## 2013-07-26 NOTE — Assessment & Plan Note (Signed)
Continue aspirin and statin. 

## 2013-07-29 ENCOUNTER — Other Ambulatory Visit: Payer: Self-pay | Admitting: *Deleted

## 2013-07-29 DIAGNOSIS — E785 Hyperlipidemia, unspecified: Secondary | ICD-10-CM

## 2013-07-31 ENCOUNTER — Ambulatory Visit
Admission: RE | Admit: 2013-07-31 | Discharge: 2013-07-31 | Disposition: A | Discharge status: Home / Self Care | Payer: Medicare Other | Source: Ambulatory Visit

## 2013-07-31 DIAGNOSIS — Z1231 Encounter for screening mammogram for malignant neoplasm of breast: Secondary | ICD-10-CM

## 2013-08-16 ENCOUNTER — Ambulatory Visit (HOSPITAL_BASED_OUTPATIENT_CLINIC_OR_DEPARTMENT_OTHER): Payer: Medicare Other

## 2013-08-16 ENCOUNTER — Ambulatory Visit (HOSPITAL_COMMUNITY): Payer: Medicare Other | Attending: Cardiology | Admitting: Cardiology

## 2013-08-16 ENCOUNTER — Encounter: Payer: Self-pay | Admitting: Cardiology

## 2013-08-16 DIAGNOSIS — I2510 Atherosclerotic heart disease of native coronary artery without angina pectoris: Secondary | ICD-10-CM

## 2013-08-16 DIAGNOSIS — R0989 Other specified symptoms and signs involving the circulatory and respiratory systems: Secondary | ICD-10-CM

## 2013-08-16 NOTE — Progress Notes (Signed)
Stress echo performed. 

## 2014-03-10 ENCOUNTER — Encounter: Payer: Medicare Other | Admitting: Family Medicine

## 2014-04-30 ENCOUNTER — Ambulatory Visit (INDEPENDENT_AMBULATORY_CARE_PROVIDER_SITE_OTHER): Payer: Medicare Other | Admitting: Family

## 2014-04-30 ENCOUNTER — Encounter: Payer: Self-pay | Admitting: Family

## 2014-04-30 VITALS — BP 108/74 | HR 71 | Temp 97.5°F | Ht 64.0 in | Wt 171.7 lb

## 2014-04-30 DIAGNOSIS — I10 Essential (primary) hypertension: Secondary | ICD-10-CM

## 2014-04-30 DIAGNOSIS — E7800 Pure hypercholesterolemia, unspecified: Secondary | ICD-10-CM

## 2014-04-30 DIAGNOSIS — D649 Anemia, unspecified: Secondary | ICD-10-CM

## 2014-04-30 DIAGNOSIS — Z23 Encounter for immunization: Secondary | ICD-10-CM

## 2014-04-30 LAB — HEPATIC FUNCTION PANEL
ALBUMIN: 3.9 g/dL (ref 3.5–5.2)
ALT: 11 U/L (ref 0–35)
AST: 23 U/L (ref 0–37)
Alkaline Phosphatase: 44 U/L (ref 39–117)
BILIRUBIN DIRECT: 0 mg/dL (ref 0.0–0.3)
TOTAL PROTEIN: 7.1 g/dL (ref 6.0–8.3)
Total Bilirubin: 0.5 mg/dL (ref 0.2–1.2)

## 2014-04-30 LAB — CBC WITH DIFFERENTIAL/PLATELET
BASOS ABS: 0.1 10*3/uL (ref 0.0–0.1)
BASOS PCT: 1.6 % (ref 0.0–3.0)
EOS ABS: 0.1 10*3/uL (ref 0.0–0.7)
Eosinophils Relative: 2 % (ref 0.0–5.0)
HEMATOCRIT: 38.3 % (ref 36.0–46.0)
HEMOGLOBIN: 12.4 g/dL (ref 12.0–15.0)
LYMPHS ABS: 2.1 10*3/uL (ref 0.7–4.0)
Lymphocytes Relative: 38.8 % (ref 12.0–46.0)
MCHC: 32.5 g/dL (ref 30.0–36.0)
MCV: 92.6 fl (ref 78.0–100.0)
Monocytes Absolute: 0.5 10*3/uL (ref 0.1–1.0)
Monocytes Relative: 10.1 % (ref 3.0–12.0)
NEUTROS ABS: 2.5 10*3/uL (ref 1.4–7.7)
Neutrophils Relative %: 47.5 % (ref 43.0–77.0)
Platelets: 336 10*3/uL (ref 150.0–400.0)
RBC: 4.14 Mil/uL (ref 3.87–5.11)
RDW: 15.1 % (ref 11.5–15.5)
WBC: 5.4 10*3/uL (ref 4.0–10.5)

## 2014-04-30 LAB — LIPID PANEL
CHOLESTEROL: 168 mg/dL (ref 0–200)
HDL: 55.2 mg/dL (ref >39.00)
LDL Cholesterol: 101 mg/dL — ABNORMAL HIGH (ref 0–99)
NonHDL: 112.8
TRIGLYCERIDES: 60 mg/dL (ref 0.0–149.0)
Total CHOL/HDL Ratio: 3
VLDL: 12 mg/dL (ref 0.0–40.0)

## 2014-04-30 LAB — BASIC METABOLIC PANEL
BUN: 19 mg/dL (ref 6–23)
CALCIUM: 9.6 mg/dL (ref 8.4–10.5)
CO2: 31 meq/L (ref 19–32)
CREATININE: 1.1 mg/dL (ref 0.4–1.2)
Chloride: 100 mEq/L (ref 96–112)
GFR: 60.95 mL/min (ref >60.00)
GLUCOSE: 101 mg/dL — AB (ref 70–99)
Potassium: 4.3 mEq/L (ref 3.5–5.1)
Sodium: 137 mEq/L (ref 135–145)

## 2014-04-30 MED ORDER — HYDROCHLOROTHIAZIDE 25 MG PO TABS: 25.0000 mg | ORAL_TABLET | Freq: Every day | ORAL | Status: DC

## 2014-04-30 NOTE — Patient Instructions (Signed)
Exercise to Stay Healthy Exercise helps you become and stay healthy. EXERCISE IDEAS AND TIPS Choose exercises that:  You enjoy.  Fit into your day. You do not need to exercise really hard to be healthy. You can do exercises at a slow or medium level and stay healthy. You can:  Stretch before and after working out.  Try yoga, Pilates, or tai chi.  Lift weights.  Walk fast, swim, jog, run, climb stairs, bicycle, dance, or rollerskate.  Take aerobic classes. Exercises that burn about 150 calories:  Running 1  miles in 15 minutes.  Playing volleyball for 45 to 60 minutes.  Washing and waxing a car for 45 to 60 minutes.  Playing touch football for 45 minutes.  Walking 1  miles in 35 minutes.  Pushing a stroller 1  miles in 30 minutes.  Playing basketball for 30 minutes.  Raking leaves for 30 minutes.  Bicycling 5 miles in 30 minutes.  Walking 2 miles in 30 minutes.  Dancing for 30 minutes.  Shoveling snow for 15 minutes.  Swimming laps for 20 minutes.  Walking up stairs for 15 minutes.  Bicycling 4 miles in 15 minutes.  Gardening for 30 to 45 minutes.  Jumping rope for 15 minutes.  Washing windows or floors for 45 to 60 minutes. Document Released: 05/28/2010 Document Revised: 07/18/2011 Document Reviewed: 05/28/2010 ExitCare Patient Information 2015 [employer]. This information is not intended to replace advice given to you by your health care provider. Make sure you discuss any questions you have with your health care provider.  

## 2014-04-30 NOTE — Progress Notes (Signed)
Subjective:    Patient ID: Felicia Cross, female    DOB: 05/19/1932, 80 y.o.   MRN: [id number]  HPI 80 year old African-American female, nonsmoker is in to be established. She has a history of hypertension, hyperlipidemia, coronary artery disease, and degenerative joint disease. She is currently doing well and tolerating medications well. Sees cardiology once a year as well.    Review of Systems  Constitutional: Negative.   HENT: Negative.   Respiratory: Negative.   Cardiovascular: Negative.   Gastrointestinal: Negative.   Endocrine: Negative.   Genitourinary: Negative.   Musculoskeletal: Negative.   Skin: Negative.   Allergic/Immunologic: Negative.   Neurological: Negative.   Psychiatric/Behavioral: Negative.    Past Medical History  Diagnosis Date  . Diverticulosis of colon   . Arthritis   . Hypertension   . Hyperlipidemia   . CVD (cerebrovascular disease)   . CAD (coronary artery disease)     History   Social History  . Marital Status: Married    Spouse Name: N/A    Number of Children: N/A  . Years of Education: N/A   Occupational History  . Not on file.   Social History Main Topics  . Smoking status: Former Smoker    Quit date: 11/01/1980  . Smokeless tobacco: Never Used  . Alcohol Use: Yes     Comment: occasional glass wine  . Drug Use: No  . Sexual Activity: Not on file   Other Topics Concern  . Not on file   Social History Narrative    Past Surgical History  Procedure Laterality Date  . Abdominal hysterectomy  1984  . Replacement total knee  2007    left  . Total hip arthroplasty  2010    Dr. Turner Danielsowan, right    Family History  Problem Relation Age of Onset  . Diabetes Mother   . Hypertension Father   . Kidney disease Father   . Heart attack Sister   . Heart attack Brother   . Colon cancer Neg Hx   . Stomach cancer Neg Hx   . Esophageal cancer Neg Hx   . Rectal cancer Neg Hx     Allergies  Allergen Reactions  . Codeine    REACTION: gi upset  . Hydrocodone     REACTION: gi upset    Current Outpatient Prescriptions on File Prior to Visit  Medication Sig Dispense Refill  . acetaminophen (TYLENOL) 500 MG tablet Take 1 tablet (500 mg total) by mouth every 6 (six) hours as needed for pain. 30 tablet 0  . aspirin 81 MG tablet Take 81 mg by mouth every morning.     . metoprolol tartrate (LOPRESSOR) 25 MG tablet Take 1 tablet (25 mg total) by mouth 2 (two) times daily. 180 tablet 3  . Multiple Vitamin (MULTIVITAMIN WITH MINERALS) TABS Take 1 tablet by mouth every morning.    . Potassium Gluconate 550 MG TABS Take 1 tablet by mouth every morning. OTC potassium supplement    . simvastatin (ZOCOR) 40 MG tablet Take 1 tablet (40 mg total) by mouth every evening. 90 tablet 3   No current facility-administered medications on file prior to visit.    BP 108/74 mmHg  Pulse 71  Temp(Src) 97.5 F (36.4 C) (Oral)  Ht 5\' 4"  (1.626 m)  Wt 171 lb 11.2 oz (77.883 kg)  BMI 29.46 kg/m2chart    Objective:   Physical Exam  Constitutional: She is oriented to person, place, and time. She appears well-developed and well-nourished.  HENT:  Right Ear: External ear normal.  Left Ear: External ear normal.  Nose: Nose normal.  Mouth/Throat: Oropharynx is clear and moist.  Neck: Normal range of motion. Neck supple. No thyromegaly present.  Cardiovascular: Normal rate, regular rhythm and normal heart sounds.   Pulmonary/Chest: Effort normal and breath sounds normal.  Abdominal: Soft. Bowel sounds are normal.  Musculoskeletal: Normal range of motion.  Neurological: She is alert and oriented to person, place, and time.  Skin: Skin is warm and dry.  Psychiatric: She has a normal mood and affect.          Assessment & Plan:  Felicia Cross was seen today for no specified reason.  Diagnoses and associated orders for this visit:  Essential hypertension - Basic Metabolic Panel - Hepatic Function Panel  Pure  hypercholesterolemia - Basic Metabolic Panel - Hepatic Function Panel - Lipid Panel  Other Orders - hydrochlorothiazide (HYDRODIURIL) 25 MG tablet; Take 1 tablet (25 mg total) by mouth daily.    Encouraged healthy diet, exercise, monthly self breast exams.

## 2014-04-30 NOTE — Progress Notes (Signed)
Pre visit review using our clinic review tool, if applicable. No additional management support is needed unless otherwise documented below in the visit note. 

## 2014-04-30 NOTE — Addendum Note (Signed)
Addended by: Bonnye FavaKWEI, NANA K on: 04/30/2014 09:41 AM   Modules accepted: Orders

## 2014-07-28 ENCOUNTER — Ambulatory Visit: Payer: Medicare Other | Admitting: Cardiology

## 2014-08-06 NOTE — Progress Notes (Signed)
      HPI: FU hypertension, hyperlipidemia and minimal CAD. She had a previous catheterization in August 2006, this showed minor disease in the diagonal and distal LAD. We also performed carotid Dopplers in Feb 2012 that showed 0-39% stenosis. F/U not deemed necessary as this had been stable over serial exams. Stress echo 4/15 normal. Since last seen, she denies dyspnea, chest pain, palpitations or syncope.  Current Outpatient Prescriptions  Medication Sig Dispense Refill  . aspirin 81 MG tablet Take 81 mg by mouth every morning.     . hydrochlorothiazide (HYDRODIURIL) 25 MG tablet Take 1 tablet (25 mg total) by mouth daily. 90 tablet 3  . ibuprofen (ADVIL,MOTRIN) 200 MG tablet Take 200 mg by mouth every 6 (six) hours as needed.    . metoprolol tartrate (LOPRESSOR) 25 MG tablet Take 1 tablet (25 mg total) by mouth 2 (two) times daily. 180 tablet 3  . Multiple Vitamin (MULTIVITAMIN WITH MINERALS) TABS Take 1 tablet by mouth every morning.    . Potassium Gluconate 550 MG TABS Take 1 tablet by mouth every morning. OTC potassium supplement    . simvastatin (ZOCOR) 40 MG tablet Take 1 tablet (40 mg total) by mouth every evening. 90 tablet 3   No current facility-administered medications for this visit.     Past Medical History  Diagnosis Date  . Diverticulosis of colon   . Arthritis   . Hypertension   . Hyperlipidemia   . CVD (cerebrovascular disease)   . CAD (coronary artery disease)     Past Surgical History  Procedure Laterality Date  . Abdominal hysterectomy  1984  . Replacement total knee  2007    left  . Total hip arthroplasty  2010    Dr. Turner Danielsowan, right    History   Social History  . Marital Status: Married    Spouse Name: N/A  . Number of Children: N/A  . Years of Education: N/A   Occupational History  . Not on file.   Social History Main Topics  . Smoking status: Former Smoker    Quit date: 11/01/1980  . Smokeless tobacco: Never Used  . Alcohol Use: Yes   Comment: occasional glass wine  . Drug Use: No  . Sexual Activity: Not on file   Other Topics Concern  . Not on file   Social History Narrative    ROS: no fevers or chills, productive cough, hemoptysis, dysphasia, odynophagia, melena, hematochezia, dysuria, hematuria, rash, seizure activity, orthopnea, PND, pedal edema, claudication. Remaining systems are negative.  Physical Exam: Well-developed well-nourished in no acute distress.  Skin is warm and dry.  HEENT is normal.  Neck is supple.  Chest is clear to auscultation with normal expansion.  Cardiovascular exam is regular rate and rhythm.  Abdominal exam nontender or distended. No masses palpated. Extremities show no edema. neuro grossly intact  ECG sinus rhythm at a rate of 71. No ST changes.

## 2014-08-11 ENCOUNTER — Encounter: Payer: Self-pay | Admitting: Cardiology

## 2014-08-11 ENCOUNTER — Ambulatory Visit (INDEPENDENT_AMBULATORY_CARE_PROVIDER_SITE_OTHER): Payer: Medicare Other | Admitting: Cardiology

## 2014-08-11 VITALS — BP 160/88 | HR 71 | Ht 64.0 in | Wt 177.6 lb

## 2014-08-11 DIAGNOSIS — I10 Essential (primary) hypertension: Secondary | ICD-10-CM

## 2014-08-11 DIAGNOSIS — E7800 Pure hypercholesterolemia, unspecified: Secondary | ICD-10-CM

## 2014-08-11 DIAGNOSIS — I2510 Atherosclerotic heart disease of native coronary artery without angina pectoris: Secondary | ICD-10-CM | POA: Diagnosis not present

## 2014-08-11 MED ORDER — METOPROLOL TARTRATE 25 MG PO TABS: 25.0000 mg | ORAL_TABLET | Freq: Two times a day (BID) | ORAL | Status: DC

## 2014-08-11 MED ORDER — SIMVASTATIN 40 MG PO TABS: 40.0000 mg | ORAL_TABLET | Freq: Every evening | ORAL | Status: DC

## 2014-08-11 NOTE — Assessment & Plan Note (Signed)
Blood pressure mildly elevated. She states typically controlled at home. Continue present medications and follow.

## 2014-08-11 NOTE — Assessment & Plan Note (Signed)
Minimal. Continue aspirin and statin.

## 2014-08-11 NOTE — Assessment & Plan Note (Signed)
Continue aspirin and statin. 

## 2014-08-11 NOTE — Assessment & Plan Note (Signed)
Continue statin. 

## 2014-08-11 NOTE — Patient Instructions (Signed)
Your physician wants you to follow-up in: ONE YEAR WITH DR CRENSHAW You will receive a reminder letter in the mail two months in advance. If you don't receive a letter, please call our office to schedule the follow-up appointment.  

## 2014-09-15 ENCOUNTER — Other Ambulatory Visit: Payer: Self-pay

## 2014-09-15 DIAGNOSIS — Z1231 Encounter for screening mammogram for malignant neoplasm of breast: Secondary | ICD-10-CM

## 2014-09-17 ENCOUNTER — Ambulatory Visit
Admission: RE | Admit: 2014-09-17 | Discharge: 2014-09-17 | Disposition: A | Discharge status: Home / Self Care | Payer: Medicare Other | Source: Ambulatory Visit

## 2014-09-17 DIAGNOSIS — Z1231 Encounter for screening mammogram for malignant neoplasm of breast: Secondary | ICD-10-CM | POA: Diagnosis not present

## 2014-10-01 ENCOUNTER — Ambulatory Visit: Payer: Medicare Other | Admitting: Family Medicine

## 2014-10-08 ENCOUNTER — Other Ambulatory Visit: Payer: Medicare Other

## 2014-10-15 ENCOUNTER — Encounter: Payer: Medicare Other | Admitting: Family

## 2014-11-21 DIAGNOSIS — M25561 Pain in right knee: Secondary | ICD-10-CM | POA: Insufficient documentation

## 2014-12-29 ENCOUNTER — Telehealth: Payer: Self-pay | Admitting: Family Medicine

## 2014-12-29 NOTE — Telephone Encounter (Signed)
Received records from [facility], sent to Dr. Selena Batten at [facility]. 12/29/14/ss

## 2015-01-07 ENCOUNTER — Ambulatory Visit (INDEPENDENT_AMBULATORY_CARE_PROVIDER_SITE_OTHER): Payer: Medicare Other | Admitting: Family Medicine

## 2015-01-07 ENCOUNTER — Encounter: Payer: Self-pay | Admitting: Family Medicine

## 2015-01-07 VITALS — BP 142/94 | HR 76 | Temp 98.1°F | Ht 64.0 in | Wt 178.8 lb

## 2015-01-07 DIAGNOSIS — I10 Essential (primary) hypertension: Secondary | ICD-10-CM

## 2015-01-07 DIAGNOSIS — E785 Hyperlipidemia, unspecified: Secondary | ICD-10-CM | POA: Diagnosis not present

## 2015-01-07 DIAGNOSIS — I779 Disorder of arteries and arterioles, unspecified: Secondary | ICD-10-CM

## 2015-01-07 DIAGNOSIS — M1990 Unspecified osteoarthritis, unspecified site: Secondary | ICD-10-CM

## 2015-01-07 DIAGNOSIS — Z7189 Other specified counseling: Secondary | ICD-10-CM | POA: Diagnosis not present

## 2015-01-07 DIAGNOSIS — I2510 Atherosclerotic heart disease of native coronary artery without angina pectoris: Secondary | ICD-10-CM

## 2015-01-07 DIAGNOSIS — Z23 Encounter for immunization: Secondary | ICD-10-CM

## 2015-01-07 DIAGNOSIS — I739 Peripheral vascular disease, unspecified: Secondary | ICD-10-CM

## 2015-01-07 DIAGNOSIS — Z7689 Persons encountering health services in other specified circumstances: Secondary | ICD-10-CM

## 2015-01-07 MED ORDER — LOSARTAN POTASSIUM 50 MG PO TABS: 50.0000 mg | ORAL_TABLET | Freq: Every day | ORAL | Status: DC

## 2015-01-07 NOTE — Patient Instructions (Addendum)
BEFORE YOU LEAVE: -flu vaccine -nurse and lab visit in 2 weeks to check BP and potassium -schedule annual wellness visit in December, come fasting for labs that day  Add the losartan for your blood pressure  Stop the potassium  Check on the cost of the shingles vaccine and on your last Tetanus booster date and let us know if you wish to get these.

## 2015-01-07 NOTE — Progress Notes (Signed)
HPI:  Felicia Cross is here to establish care. She used dto see Worthy Rancher for her primary care. Last PCP and physical:  Has the following chronic problems that require follow up and concerns today:  CAD, HLD, HTN: -sees Dr. Jens Som -cath in 2006 with minor dz, carotid dopplers in 2012 with minimal stenosis, normal echo 2014 per review cardiology notes -she reports chronically mildly elevated BP at home and is concerned about this and wants to treat -meds: asa, simvastatin 40, metoprolol tartrate 25, hctz 25, potassium gluconate  ROS negative for unless reported above: fevers, unintentional weight loss, hearing or vision loss, chest pain, palpitations, struggling to breath, hemoptysis, melena, hematochezia, hematuria, falls, loc, si, thoughts of self harm  Past Medical History  Diagnosis Date  . Diverticulosis of colon   . Arthritis   . Hypertension   . Hyperlipidemia   . CVD (cerebrovascular disease)   . CAD (coronary artery disease)   . Osteoarthritis 03/07/2006    Qualifier: Diagnosis of  By: Cato Mulligan MD, Bruce      Past Surgical History  Procedure Laterality Date  . Abdominal hysterectomy  1984  . Replacement total knee  2007    left  . Total hip arthroplasty  2010    Dr. Turner Daniels, right    Family History  Problem Relation Age of Onset  . Diabetes Mother   . Hypertension Father   . Kidney disease Father   . Heart attack Sister   . Heart attack Brother   . Colon cancer Neg Hx   . Stomach cancer Neg Hx   . Esophageal cancer Neg Hx   . Rectal cancer Neg Hx     Social History   Social History  . Marital Status: Married    Spouse Name: N/A  . Number of Children: N/A  . Years of Education: N/A   Social History Main Topics  . Smoking status: Former Smoker    Quit date: 11/01/1980  . Smokeless tobacco: Never Used  . Alcohol Use: Yes     Comment: occasional glass wine  . Drug Use: No  . Sexual Activity: Not Asked   Other Topics Concern  . None    Social History Narrative     Current outpatient prescriptions:  .  aspirin 81 MG tablet, Take 81 mg by mouth every morning. , Disp: , Rfl:  .  hydrochlorothiazide (HYDRODIURIL) 25 MG tablet, Take 1 tablet (25 mg total) by mouth daily., Disp: 90 tablet, Rfl: 3 .  ibuprofen (ADVIL,MOTRIN) 200 MG tablet, Take 200 mg by mouth every 6 (six) hours as needed., Disp: , Rfl:  .  metoprolol tartrate (LOPRESSOR) 25 MG tablet, Take 1 tablet (25 mg total) by mouth 2 (two) times daily., Disp: 180 tablet, Rfl: 3 .  Multiple Vitamin (MULTIVITAMIN WITH MINERALS) TABS, Take 1 tablet by mouth every morning., Disp: , Rfl:  .  Potassium Gluconate 550 MG TABS, Take 1 tablet by mouth every morning. OTC potassium supplement, Disp: , Rfl:  .  simvastatin (ZOCOR) 40 MG tablet, Take 1 tablet (40 mg total) by mouth every evening., Disp: 90 tablet, Rfl: 3 .  losartan (COZAAR) 50 MG tablet, Take 1 tablet (50 mg total) by mouth daily., Disp: 90 tablet, Rfl: 0  EXAM:  Filed Vitals:   01/07/15 1112  BP: 142/94  Pulse: 76  Temp: 98.1 F (36.7 C)    Body mass index is 30.68 kg/(m^2).  GENERAL: vitals reviewed and listed above, alert, oriented, appears well hydrated  and in no acute distress  HEENT: atraumatic, conjunttiva clear, no obvious abnormalities on inspection of external nose and ears  NECK: no obvious masses on inspection  LUNGS: clear to auscultation bilaterally, no wheezes, rales or rhonchi, good air movement  CV: HRRR, no peripheral edema  MS: moves all extremities without noticeable abnormality  PSYCH: pleasant and cooperative, no obvious depression or anxiety  ASSESSMENT AND PLAN:  Discussed the following assessment and plan:  Essential hypertension -discussed options/risks, she is concerned and prefers to treat, opted to try adding losartan and may be able to do combo, this may alleviate the need for potassium supplement as well   Atherosclerosis of native coronary artery of native  heart without angina pectoris Hyperlipemia Carotid artery disease, unspecified laterality -no symptoms, stable, sees cardiology -cont current treatments  Osteoarthritis, unspecified osteoarthritis type, unspecified site -sees ortho  Encounter to establish care -We reviewed the PMH, PSH, FH, SH, Meds and Allergies. -We provided refills for any medications we will prescribe as needed. -We addressed current concerns per orders and patient instructions. -We have asked for records for pertinent exams, studies, vaccines and notes from previous providers. -We have advised patient to follow up per instructions below.   -Patient advised to return or notify a doctor immediately if symptoms worsen or persist or new concerns arise.  Patient Instructions  BEFORE YOU LEAVE: -flu vaccine -nurse and lab visit in 2 weeks to check BP and potassium -schedule annual wellness visit in December, come fasting for labs that day  Add the losartan for your blood pressure  Stop the potassium  Check on the cost of the shingles vaccine and on your last Tetanus booster date and let us know if you wish to get these.       Kriste Basque R.

## 2015-01-07 NOTE — Progress Notes (Signed)
Pre visit review using our clinic review tool, if applicable. No additional management support is needed unless otherwise documented below in the visit note. 

## 2015-01-08 ENCOUNTER — Telehealth: Payer: Self-pay | Admitting: Family Medicine

## 2015-01-08 NOTE — Telephone Encounter (Signed)
Pt call to ask if she is suppose to continue taking hydrochlorothiazide (HYDRODIURIL) 25 MG tablet along with the new med losartan (COZAAR) 50 MG tablet Would like a call back about this

## 2015-01-08 NOTE — Telephone Encounter (Signed)
Patient informed. 

## 2015-01-08 NOTE — Telephone Encounter (Signed)
Yes

## 2015-01-21 ENCOUNTER — Other Ambulatory Visit: Payer: Self-pay

## 2015-01-23 ENCOUNTER — Ambulatory Visit (INDEPENDENT_AMBULATORY_CARE_PROVIDER_SITE_OTHER): Payer: Medicare Other | Admitting: Family Medicine

## 2015-01-23 ENCOUNTER — Other Ambulatory Visit (INDEPENDENT_AMBULATORY_CARE_PROVIDER_SITE_OTHER): Payer: Medicare Other

## 2015-01-23 ENCOUNTER — Encounter: Payer: Self-pay | Admitting: Family Medicine

## 2015-01-23 ENCOUNTER — Ambulatory Visit: Payer: Medicare Other | Admitting: *Deleted

## 2015-01-23 VITALS — BP 138/90

## 2015-01-23 DIAGNOSIS — I2510 Atherosclerotic heart disease of native coronary artery without angina pectoris: Secondary | ICD-10-CM

## 2015-01-23 DIAGNOSIS — I10 Essential (primary) hypertension: Secondary | ICD-10-CM

## 2015-01-23 DIAGNOSIS — Z23 Encounter for immunization: Secondary | ICD-10-CM

## 2015-01-23 LAB — BASIC METABOLIC PANEL
BUN: 19 mg/dL (ref 6–23)
CHLORIDE: 102 meq/L (ref 96–112)
CO2: 33 meq/L — AB (ref 19–32)
Calcium: 9.9 mg/dL (ref 8.4–10.5)
Creatinine, Ser: 1.08 mg/dL (ref 0.40–1.20)
GFR: 62.8 mL/min (ref >60.00)
GLUCOSE: 82 mg/dL (ref 70–99)
Potassium: 4.3 mEq/L (ref 3.5–5.1)
Sodium: 141 mEq/L (ref 135–145)

## 2015-01-23 NOTE — Progress Notes (Signed)
HPI:  Follow up HTN: -has nurse visit today and BP still up  -reports she actually forgot to take medications this morning -denies: CP, SOB, DOE, HA -has BP cuff at home  ROS: See pertinent positives and negatives per HPI.  Past Medical History  Diagnosis Date  . Diverticulosis of colon   . Arthritis   . Hypertension   . Hyperlipidemia   . CVD (cerebrovascular disease)   . CAD (coronary artery disease)   . Osteoarthritis 03/07/2006    Qualifier: Diagnosis of  By: Cato Mulligan MD, Bruce      Past Surgical History  Procedure Laterality Date  . Abdominal hysterectomy  1984  . Replacement total knee  2007    left  . Total hip arthroplasty  2010    Dr. Turner Daniels, right    Family History  Problem Relation Age of Onset  . Diabetes Mother   . Hypertension Father   . Kidney disease Father   . Heart attack Sister   . Heart attack Brother   . Colon cancer Neg Hx   . Stomach cancer Neg Hx   . Esophageal cancer Neg Hx   . Rectal cancer Neg Hx     Social History   Social History  . Marital Status: Married    Spouse Name: N/A  . Number of Children: N/A  . Years of Education: N/A   Social History Main Topics  . Smoking status: Former Smoker    Quit date: 11/01/1980  . Smokeless tobacco: Never Used  . Alcohol Use: Yes     Comment: occasional glass wine  . Drug Use: No  . Sexual Activity: Not Asked   Other Topics Concern  . None   Social History Narrative   Updated 01/07/15   Work or School: retired from Geneticist, molecular, volunteers at [employer]. [place]      Home 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, Information systems manager and two great grandchildren age 42 and 50      Spiritual Beliefs: Christian      Lifestyle: regular exercise at the [facility]; healthy diet           Current outpatient prescriptions:  .  aspirin 81 MG tablet, Take 81 mg by mouth every morning. , Disp: , Rfl:  .  hydrochlorothiazide (HYDRODIURIL) 25 MG tablet, Take 1 tablet (25 mg total) by mouth daily., Disp: 90 tablet,  Rfl: 3 .  ibuprofen (ADVIL,MOTRIN) 200 MG tablet, Take 200 mg by mouth every 6 (six) hours as needed., Disp: , Rfl:  .  losartan (COZAAR) 50 MG tablet, Take 1 tablet (50 mg total) by mouth daily., Disp: 90 tablet, Rfl: 0 .  metoprolol tartrate (LOPRESSOR) 25 MG tablet, Take 1 tablet (25 mg total) by mouth 2 (two) times daily., Disp: 180 tablet, Rfl: 3 .  Multiple Vitamin (MULTIVITAMIN WITH MINERALS) TABS, Take 1 tablet by mouth every morning., Disp: , Rfl:  .  Potassium Gluconate 550 MG TABS, Take 1 tablet by mouth every morning. OTC potassium supplement, Disp: , Rfl:  .  simvastatin (ZOCOR) 40 MG tablet, Take 1 tablet (40 mg total) by mouth every evening., Disp: 90 tablet, Rfl: 3  EXAM:  Filed Vitals:   01/23/15 1049  BP: 138/90    There is no weight on file to calculate BMI.  GENERAL: vitals reviewed and listed above, alert, oriented, appears well hydrated and in no acute distress  HEENT: atraumatic, conjunttiva clear, no obvious abnormalities on inspection of external nose and ears  NECK: no obvious masses  on inspection  LUNGS: clear to auscultation bilaterally, no wheezes, rales or rhonchi, good air movement  CV: HRRR, no peripheral edema  MS: moves all extremities without noticeable abnormality  PSYCH: pleasant and cooperative, no obvious depression or anxiety  ASSESSMENT AND PLAN:  Discussed the following assessment and plan:  Essential hypertension  Atherosclerosis of native coronary artery of native heart without angina pectoris  -BP 140/92 my check -advise take medications every day -check home BP and cal in 1 week to let us know if any values over 90/140 and would plan to increase losartan a little if so -follow up as scheduled -Patient advised to return or notify a doctor immediately if symptoms worsen or persist or new concerns arise.  Patient Instructions  Take medications every morning  Check blood pressure and let us know if running > 89/140  Follow up  as scheduled     KIM, HANNAH R.

## 2015-01-23 NOTE — Progress Notes (Signed)
Pre visit review using our clinic review tool, if applicable. No additional management support is needed unless otherwise documented below in the visit note. 

## 2015-01-23 NOTE — Patient Instructions (Signed)
Take medications every morning  Check blood pressure and let us know if running > 89/140  Follow up as scheduled

## 2015-04-10 ENCOUNTER — Encounter: Payer: Self-pay | Admitting: Family Medicine

## 2015-04-10 ENCOUNTER — Ambulatory Visit (INDEPENDENT_AMBULATORY_CARE_PROVIDER_SITE_OTHER): Payer: Medicare Other | Admitting: Family Medicine

## 2015-04-10 VITALS — BP 136/82 | HR 70 | Temp 97.8°F | Ht 63.5 in | Wt 181.6 lb

## 2015-04-10 DIAGNOSIS — E785 Hyperlipidemia, unspecified: Secondary | ICD-10-CM

## 2015-04-10 DIAGNOSIS — Z0000 Encounter for general adult medical examination without abnormal findings: Secondary | ICD-10-CM | POA: Diagnosis not present

## 2015-04-10 DIAGNOSIS — I779 Disorder of arteries and arterioles, unspecified: Secondary | ICD-10-CM | POA: Diagnosis not present

## 2015-04-10 DIAGNOSIS — I10 Essential (primary) hypertension: Secondary | ICD-10-CM | POA: Diagnosis not present

## 2015-04-10 DIAGNOSIS — M1990 Unspecified osteoarthritis, unspecified site: Secondary | ICD-10-CM | POA: Diagnosis not present

## 2015-04-10 DIAGNOSIS — E2839 Other primary ovarian failure: Secondary | ICD-10-CM

## 2015-04-10 DIAGNOSIS — I739 Peripheral vascular disease, unspecified: Secondary | ICD-10-CM

## 2015-04-10 DIAGNOSIS — Z23 Encounter for immunization: Secondary | ICD-10-CM | POA: Diagnosis not present

## 2015-04-10 LAB — BASIC METABOLIC PANEL
BUN: 20 mg/dL (ref 6–23)
CALCIUM: 9.8 mg/dL (ref 8.4–10.5)
CO2: 32 mEq/L (ref 19–32)
Chloride: 101 mEq/L (ref 96–112)
Creatinine, Ser: 1.02 mg/dL (ref 0.40–1.20)
GFR: 67.04 mL/min (ref >60.00)
Glucose, Bld: 92 mg/dL (ref 70–99)
Potassium: 4.7 mEq/L (ref 3.5–5.1)
SODIUM: 139 meq/L (ref 135–145)

## 2015-04-10 LAB — LIPID PANEL
CHOL/HDL RATIO: 3
Cholesterol: 165 mg/dL (ref 0–200)
HDL: 60 mg/dL (ref >39.00)
LDL CALC: 94 mg/dL (ref 0–99)
NonHDL: 104.91
Triglycerides: 53 mg/dL (ref 0.0–149.0)
VLDL: 10.6 mg/dL (ref 0.0–40.0)

## 2015-04-10 MED ORDER — LOSARTAN POTASSIUM 50 MG PO TABS: 50.0000 mg | ORAL_TABLET | Freq: Every day | ORAL | Status: DC

## 2015-04-10 NOTE — Progress Notes (Signed)
Medicare Annual Preventive Care Visit  (initial annual wellness or annual wellness exam)  Concerns and/or follow up today:  CAD, HLD, HTN: -sees Dr. Jens Som -cath in 2006 with minor dz, carotid dopplers in 2012 with minimal stenosis, normal echo 2014 per review cardiology notes -meds: asa, simvastatin 40, metoprolol tartrate 25, hctz 25, potassium gluconate, losartan  -no CP, SOB, DOE, swelling  OA of knees: -sees Dr. Sherlean Foot -considering knee replacement -no falls  ROS: negative for report of fevers, unintentional weight loss, vision changes, vision loss, hearing loss or change, chest pain, sob, hemoptysis, melena, hematochezia, hematuria, genital discharge or lesions, falls, bleeding or bruising, loc, thoughts of suicide or self harm, memory loss  1.) Patient-completed health risk assessment  - completed and reviewed, see scanned documentation  2.) Review of Medical History: -PMH, PSH, Family History and current specialty and care providers reviewed and updated and listed below  - see scanned in document in chart and below  Past Medical History  Diagnosis Date  . Diverticulosis of colon   . Arthritis   . Hypertension   . Hyperlipidemia   . CVD (cerebrovascular disease)   . CAD (coronary artery disease)   . Osteoarthritis 03/07/2006    Qualifier: Diagnosis of  By: Cato Mulligan MD, Bruce      Past Surgical History  Procedure Laterality Date  . Abdominal hysterectomy  1984  . Replacement total knee  2007    left  . Total hip arthroplasty  2010    Dr. Turner Daniels, right    Social History   Social History  . Marital Status: Married    Spouse Name: N/A  . Number of Children: N/A  . Years of Education: N/A   Occupational History  . Not on file.   Social History Main Topics  . Smoking status: Former Smoker    Quit date: 11/01/1980  . Smokeless tobacco: Never Used  . Alcohol Use: Yes     Comment: occasional glass wine  . Drug Use: No  . Sexual Activity: Not on file    Other Topics Concern  . Not on file   Social History Narrative   Updated 01/07/15   Work or School: retired from Geneticist, molecular, volunteers at [employer]. [place]      Home 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, Information systems manager and two great grandchildren age 87 and 24      Spiritual Beliefs: Christian      Lifestyle: regular exercise at the [facility]; healthy diet          Family History  Problem Relation Age of Onset  . Diabetes Mother   . Hypertension Father   . Kidney disease Father   . Heart attack Sister   . Heart attack Brother   . Colon cancer Neg Hx   . Stomach cancer Neg Hx   . Esophageal cancer Neg Hx   . Rectal cancer Neg Hx     Current Outpatient Prescriptions on File Prior to Visit  Medication Sig Dispense Refill  . aspirin 81 MG tablet Take 81 mg by mouth every morning.     . hydrochlorothiazide (HYDRODIURIL) 25 MG tablet Take 1 tablet (25 mg total) by mouth daily. 90 tablet 3  . ibuprofen (ADVIL,MOTRIN) 200 MG tablet Take 200 mg by mouth every 6 (six) hours as needed.    . metoprolol tartrate (LOPRESSOR) 25 MG tablet Take 1 tablet (25 mg total) by mouth 2 (two) times daily. 180 tablet 3  . Multiple Vitamin (MULTIVITAMIN WITH MINERALS) TABS Take 1  tablet by mouth every morning.    . Potassium Gluconate 550 MG TABS Take 1 tablet by mouth every morning. OTC potassium supplement    . simvastatin (ZOCOR) 40 MG tablet Take 1 tablet (40 mg total) by mouth every evening. 90 tablet 3   No current facility-administered medications on file prior to visit.     3.) Review of functional ability and level of safety:  Any difficulty hearing? YES NO  History of falling? YES NO  Any trouble with IADLs - using a phone, using transportation, grocery shopping, preparing meals, doing housework, doing laundry, taking medications and managing money? YES NO  Advance Directives? YES NO  See summary of recommendations in Patient Instructions below.  4.) Physical Exam Filed Vitals:    04/10/15 1133  BP: 136/82  Pulse: 70  Temp: 97.8 F (36.6 C)   Estimated body mass index is 31.66 kg/(m^2) as calculated from the following:   Height as of this encounter: 5' 3.5" (1.613 m).   Weight as of this encounter: 181 lb 9.6 oz (82.373 kg).  EKG (optional): deferred  General: alert, appear well hydrated and in no acute distress  HEENT: visual acuity grossly intact, normal inspection ear, nose and mouth  CV: HRRR, no peripheral edema  Lungs: CTA bilaterally  Psych: pleasant and cooperative, no obvious depression or anxiety  Cognitive function grossly intact  See patient instructions for recommendations.  Education and counseling regarding the above review of health provided with a plan for the following: -see scanned patient completed form for further details -fall prevention strategies discussed  -healthy lifestyle discussed -importance and resources for completing advanced directives discussed -see patient instructions below for any other recommendations provided  4)The following written screening schedule of preventive measures were reviewed with assessment and plan made per below, orders and patient instructions:      AAA screening n/a     Alcohol screening done     Obesity Screening and counseling done     STI screening (Hep C if born [birth year]) declined     Tobacco Screening done       Pneumococcal (PPSV23 -one dose after 64, one before if risk factors), influenza yearly and hepatitis B vaccines (if high risk - end stage renal disease, IV drugs, homosexual men, live in home for mentally retarded, hemophilia receiving factors) ASSESSMENT/PLAN: done      Screening mammograph (yearly if >40) ASSESSMENT/PLAN: done      Screening Pap smear/pelvic exam (q2 years) ASSESSMENT/PLAN: sees gyn      Prostate cancer screening ASSESSMENT/PLAN: n/a      Colorectal cancer screening (FOBT yearly or flex sig q4y or colonoscopy q10y or barium enema  q4y) ASSESSMENT/PLAN:      Diabetes outpatient self-management training services ASSESSMENT/PLAN: done      Bone mass measurements(covered q2y if indicated - estrogen def, osteoporosis, hyperparathyroid, vertebral abnormalities, osteoporosis or steroids) ASSESSMENT/PLAN: reports normal about 10 years ago, do not have records of this, opted to do at breast center, orders placed      Screening for glaucoma(q1y if high risk - diabetes, FH, AA and > 50 or hispanic and > 65) ASSESSMENT/PLAN: sees optho yearly, no glaucoma per her report      Medical nutritional therapy for individuals with diabetes or renal disease ASSESSMENT/PLAN: n/a      Cardiovascular screening blood tests (lipids q5y) ASSESSMENT/PLAN: doing today      Diabetes screening tests ASSESSMENT/PLAN: doing today   7.) Summary:   Medicare annual wellness visit, subsequent -  risk factors and conditions per above assessment were discussed and treatment, recommendations and referrals were offered per documentation above and orders and patient instructions.  Essential hypertension - Plan: Basic metabolic panel -BP ok today, out of losartan for several days, refilled  Hyperlipemia - Plan: Lipid Panel -lifestyle recs, continue statin  Carotid artery disease, unspecified laterality (HCC) -sees cardiology, asymptomatic  Osteoarthritis, unspecified osteoarthritis type, unspecified site -considering knee surgery, active, tolerating exercise well currently  Estrogen deficiency - Plan: DG Bone Density -discussed tx options if osteoporosis found  on exam  Patient Instructions  BEFORE YOU LEAVE: -shingles vaccine -labs -follow up in 4-6 months  -We have ordered labs or studies at this visit. It can take up to 1-2 weeks for results and processing. We will contact you with instructions IF your results are abnormal. Normal results will be released to your [facility]. If you have not heard from us or can not find your results in  [facility] in 2 weeks please contact our office.  We recommend the following healthy lifestyle measures: - eat a healthy whole foods diet consisting of regular small meals composed of vegetables, fruits, beans, nuts, seeds, healthy meats such as white chicken and fish and whole grains.  - avoid sweets, white starchy foods, fried foods, fast food, processed foods, sodas, red meet and other fattening foods.  - get a least 150-300 minutes of aerobic exercise per week.   Please see a lawyer and/or go to this website to help you with advanced directives and designating a health care power of attorney so that your wishes will be followed should you become too ill to make your own medical decisions.  http://[email or web address]://www.ncdhhs.gov/aging/direct.htm  -We placed a referral for you as discussed for the bone density test. It usually takes about 1-2 weeks to process and schedule this referral. If you have not heard from us regarding this appointment in 2 weeks please contact our office.

## 2015-04-10 NOTE — Progress Notes (Signed)
Pre visit review using our clinic review tool, if applicable. No additional management support is needed unless otherwise documented below in the visit note. 

## 2015-04-10 NOTE — Addendum Note (Signed)
Addended by: Johnella MoloneyFUNDERBURK, Dhanya Bogle A on: 04/10/2015 12:21 PM   Modules accepted: Orders

## 2015-04-10 NOTE — Patient Instructions (Signed)
BEFORE YOU LEAVE: -shingles vaccine -labs -follow up in 4-6 months  -We have ordered labs or studies at this visit. It can take up to 1-2 weeks for results and processing. We will contact you with instructions IF your results are abnormal. Normal results will be released to your [facility]. If you have not heard from us or can not find your results in [facility] in 2 weeks please contact our office.  We recommend the following healthy lifestyle measures: - eat a healthy whole foods diet consisting of regular small meals composed of vegetables, fruits, beans, nuts, seeds, healthy meats such as white chicken and fish and whole grains.  - avoid sweets, white starchy foods, fried foods, fast food, processed foods, sodas, red meet and other fattening foods.  - get a least 150-300 minutes of aerobic exercise per week.   Please see a lawyer and/or go to this website to help you with advanced directives and designating a health care power of attorney so that your wishes will be followed should you become too ill to make your own medical decisions.  http://[email or web address]://www.ncdhhs.gov/aging/direct.htm  -We placed a referral for you as discussed for the bone density test. It usually takes about 1-2 weeks to process and schedule this referral. If you have not heard from us regarding this appointment in 2 weeks please contact our office.

## 2015-08-06 DIAGNOSIS — M1711 Unilateral primary osteoarthritis, right knee: Secondary | ICD-10-CM | POA: Diagnosis not present

## 2015-08-06 DIAGNOSIS — M25561 Pain in right knee: Secondary | ICD-10-CM | POA: Diagnosis not present

## 2015-08-10 ENCOUNTER — Ambulatory Visit: Payer: Self-pay | Admitting: Family Medicine

## 2015-08-11 NOTE — Progress Notes (Signed)
HPI: FU hypertension, hyperlipidemia and minimal CAD. She had a previous catheterization in August 2006 that showed minor disease in the diagonal and distal LAD. We also performed carotid Dopplers in Feb 2012 that showed 0-39% stenosis. F/U not deemed necessary as this had been stable over serial exams. Stress echo 4/15 normal. Since last seen, She denies dyspnea on exertion, orthopnea, PND, pedal edema, chest pain or syncope. She is having the trouble and will require knee replacement. We were asked to evaluate preoperatively.  Current Outpatient Prescriptions  Medication Sig Dispense Refill  . aspirin 81 MG tablet Take 81 mg by mouth every morning.     . hydrochlorothiazide (HYDRODIURIL) 25 MG tablet Take 1 tablet (25 mg total) by mouth daily. 90 tablet 3  . metoprolol tartrate (LOPRESSOR) 25 MG tablet Take 1 tablet (25 mg total) by mouth 2 (two) times daily. 180 tablet 3  . Multiple Vitamin (MULTIVITAMIN WITH MINERALS) TABS Take 1 tablet by mouth every morning.    . Potassium Gluconate 550 MG TABS Take 1 tablet by mouth every morning. OTC potassium supplement    . simvastatin (ZOCOR) 40 MG tablet Take 1 tablet (40 mg total) by mouth every evening. 90 tablet 3   No current facility-administered medications for this visit.     Past Medical History  Diagnosis Date  . Diverticulosis of colon   . Arthritis   . Hypertension   . Hyperlipidemia   . CVD (cerebrovascular disease)   . CAD (coronary artery disease)   . Osteoarthritis 03/07/2006    Qualifier: Diagnosis of  By: Cato MulliganSwords MD, Bruce      Past Surgical History  Procedure Laterality Date  . Abdominal hysterectomy  1984  . Replacement total knee  2007    left  . Total hip arthroplasty  2010    Dr. Turner Danielsowan, right    Social History   Social History  . Marital Status: Married    Spouse Name: N/A  . Number of Children: N/A  . Years of Education: N/A   Occupational History  . Not on file.   Social History Main Topics    . Smoking status: Former Smoker    Quit date: 11/01/1980  . Smokeless tobacco: Never Used  . Alcohol Use: 0.0 oz/week    0 Standard drinks or equivalent per week     Comment: occasional glass wine  . Drug Use: No  . Sexual Activity: Not on file   Other Topics Concern  . Not on file   Social History Narrative   Updated 01/07/15   Work or School: retired from [employer], [employer], volunteers at [employer] [place]      Home 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, Information systems managergrandaughter and two great grandchildren age 73 and 196      Spiritual Beliefs: Christian      Lifestyle: regular exercise at the [facility]; healthy diet          Family History  Problem Relation Age of Onset  . Diabetes Mother   . Hypertension Father   . Kidney disease Father   . Heart attack Sister   . Heart attack Brother   . Colon cancer Neg Hx   . Stomach cancer Neg Hx   . Esophageal cancer Neg Hx   . Rectal cancer Neg Hx     ROS: Knee arthralgias but no fevers or chills, productive cough, hemoptysis, dysphasia, odynophagia, melena, hematochezia, dysuria, hematuria, rash, seizure activity, orthopnea, PND, pedal edema, claudication. Remaining systems are negative.  Physical Exam: Well-developed well-nourished in no acute distress.  Skin is warm and dry.  HEENT is normal.  Neck is supple.  Chest is clear to auscultation with normal expansion.  Cardiovascular exam is regular rate and rhythm.  Abdominal exam nontender or distended. No masses palpated. Extremities show no edema. neuro grossly intact  ECG Sinus rhythm at a rate of 70. No ST changes.

## 2015-08-14 ENCOUNTER — Encounter: Payer: Self-pay | Admitting: Cardiology

## 2015-08-14 ENCOUNTER — Ambulatory Visit (INDEPENDENT_AMBULATORY_CARE_PROVIDER_SITE_OTHER): Payer: Medicare Other | Admitting: Cardiology

## 2015-08-14 VITALS — BP 140/80 | HR 70 | Ht 64.0 in | Wt 178.4 lb

## 2015-08-14 DIAGNOSIS — I779 Disorder of arteries and arterioles, unspecified: Secondary | ICD-10-CM | POA: Diagnosis not present

## 2015-08-14 DIAGNOSIS — E785 Hyperlipidemia, unspecified: Secondary | ICD-10-CM | POA: Diagnosis not present

## 2015-08-14 DIAGNOSIS — E7800 Pure hypercholesterolemia, unspecified: Secondary | ICD-10-CM

## 2015-08-14 DIAGNOSIS — I2510 Atherosclerotic heart disease of native coronary artery without angina pectoris: Secondary | ICD-10-CM

## 2015-08-14 DIAGNOSIS — I10 Essential (primary) hypertension: Secondary | ICD-10-CM

## 2015-08-14 DIAGNOSIS — I739 Peripheral vascular disease, unspecified: Principal | ICD-10-CM

## 2015-08-14 DIAGNOSIS — Z01810 Encounter for preprocedural cardiovascular examination: Secondary | ICD-10-CM

## 2015-08-14 MED ORDER — METOPROLOL TARTRATE 25 MG PO TABS: 25.0000 mg | ORAL_TABLET | Freq: Two times a day (BID) | ORAL | Status: DC

## 2015-08-14 MED ORDER — SIMVASTATIN 40 MG PO TABS: 40.0000 mg | ORAL_TABLET | Freq: Every evening | ORAL | Status: DC

## 2015-08-14 NOTE — Assessment & Plan Note (Signed)
Continue aspirin and statin. 

## 2015-08-14 NOTE — Assessment & Plan Note (Signed)
Pressure controlled. Continue present medications. 

## 2015-08-14 NOTE — Assessment & Plan Note (Signed)
Continue statin. 

## 2015-08-14 NOTE — Assessment & Plan Note (Signed)
Minimal. Continue aspirin and statin.

## 2015-08-14 NOTE — Assessment & Plan Note (Signed)
Patient will require knee replacement. Last functional study showed no ischemia. She is not having chest pain. She may proceed without further cardiac evaluation. You present medications pre-and postoperatively.

## 2015-08-14 NOTE — Patient Instructions (Signed)
Your physician wants you to follow-up in: ONE YEAR WITH DR CRENSHAW You will receive a reminder letter in the mail two months in advance. If you don't receive a letter, please call our office to schedule the follow-up appointment.   If you need a refill on your cardiac medications before your next appointment, please call your pharmacy.  

## 2015-08-20 ENCOUNTER — Encounter: Payer: Self-pay | Admitting: Family Medicine

## 2015-08-20 ENCOUNTER — Ambulatory Visit (INDEPENDENT_AMBULATORY_CARE_PROVIDER_SITE_OTHER): Payer: Medicare Other | Admitting: Family Medicine

## 2015-08-20 VITALS — BP 134/88 | HR 78 | Temp 97.6°F | Ht 64.0 in | Wt 178.5 lb

## 2015-08-20 DIAGNOSIS — Z01818 Encounter for other preprocedural examination: Secondary | ICD-10-CM

## 2015-08-20 DIAGNOSIS — M1990 Unspecified osteoarthritis, unspecified site: Secondary | ICD-10-CM | POA: Diagnosis not present

## 2015-08-20 NOTE — Progress Notes (Signed)
Pre visit review using our clinic review tool, if applicable. No additional management support is needed unless otherwise documented below in the visit note. 

## 2015-08-20 NOTE — Progress Notes (Signed)
No chief complaint on file.   HPI:  Patient is seen for optimization of general medical care prior to surgery. Surgery type: R knee surgery Date of surgery: TBA  Kidney disease? No Prior surgeries/Issues following anesthesia? Hx l knee surgery, r hip surgery and hysterectomy - pt denies any problems with prior history Hx MI, heart arrythmia, CHF, angina or stroke? CAD, CVA - sees cardiologist - per recent cardiology notes no further evaluation needed prior to surgery Epilepsy or Seizures? none Arthritis or problems with neck or jaw? None per pt Thyroid disease? None per pt Liver disease? None per pt Asthma, COPD or chronic lung disease? None per pt Diabetes? None per pt (Needs to be evaluated by anesthesia if yes to these questions.)  Other: Poor nutrition, Frail or other: no  METS:  ?Can take care of self, such as eat, dress, or use the toilet (1 MET). yes ?Can walk up a flight of steps or a hill (4 METs).yes ?Can do heavy work around the house such as scrubbing floors or lifting or moving heavy furniture (between 4 and 10 METs). yes ?Can participate in strenuous sports such as swimming, singles tennis, football, basketball, and skiing (>10 METs) yes per pt . AHA Risks: Major predictors that require intensive management and may lead to delay in or cancellation of the operative procedure unless emergent: NONE   Other clinical predictors that warrant careful assessment of current status: NONE  . History of ischemic heart disease . History of cerebrovascular disease - recently saw her cardiologist and they advised per notes "Patient will require knee replacement. Last functional study showed no ischemia. She is not having chest pain. She may proceed without further cardiac evaluation. "  Type of surgery and Risk:  Intermediate risk (reported risk of cardiac death or nonfatal MI generally 1 to 5 percent):  Marland Kitchen Carotid endarterectomy  . Head and neck surgery  . Intraperitoneal and  intrathoracic surgery  . Orthopedic surgery  . Prostate surgery   Medications that need to be addressed prior to surgery: per cardiology/surgeon recommendations  ROS: See pertinent positives and negatives per HPI. 11 point ROS negative except where noted.  Past Medical History  Diagnosis Date  . Diverticulosis of colon   . Arthritis   . Hypertension   . Hyperlipidemia   . CVD (cerebrovascular disease)   . CAD (coronary artery disease)   . Osteoarthritis 03/07/2006    Qualifier: Diagnosis of  By: Leanne Chang MD, Bruce      Past Surgical History  Procedure Laterality Date  . Abdominal hysterectomy  1984  . Replacement total knee  2007    left  . Total hip arthroplasty  2010    Dr. Mayer Camel, right    Family History  Problem Relation Age of Onset  . Diabetes Mother   . Hypertension Father   . Kidney disease Father   . Heart attack Sister   . Heart attack Brother   . Colon cancer Neg Hx   . Stomach cancer Neg Hx   . Esophageal cancer Neg Hx   . Rectal cancer Neg Hx     Social History   Social History  . Marital Status: Married    Spouse Name: N/A  . Number of Children: N/A  . Years of Education: N/A   Social History Main Topics  . Smoking status: Former Smoker    Quit date: 11/01/1980  . Smokeless tobacco: Never Used  . Alcohol Use: 0.0 oz/week    0 Standard drinks  or equivalent per week     Comment: occasional glass wine  . Drug Use: No  . Sexual Activity: Not Asked   Other Topics Concern  . None   Social History Narrative   Updated 01/07/15   Work or School: retired from Catering manager, volunteers at [employer]. [place]      Home 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, Curator and two great grandchildren age 90 and 77      Spiritual Beliefs: Christian      Lifestyle: regular exercise at the [facility]; healthy diet           Current outpatient prescriptions:  .  aspirin 81 MG tablet, Take 81 mg by mouth every morning. , Disp: , Rfl:  .  hydrochlorothiazide  (HYDRODIURIL) 25 MG tablet, Take 1 tablet (25 mg total) by mouth daily., Disp: 90 tablet, Rfl: 3 .  metoprolol tartrate (LOPRESSOR) 25 MG tablet, Take 1 tablet (25 mg total) by mouth 2 (two) times daily., Disp: 180 tablet, Rfl: 3 .  Multiple Vitamin (MULTIVITAMIN WITH MINERALS) TABS, Take 1 tablet by mouth every morning., Disp: , Rfl:  .  Potassium Gluconate 550 MG TABS, Take 1 tablet by mouth every morning. OTC potassium supplement, Disp: , Rfl:  .  simvastatin (ZOCOR) 40 MG tablet, Take 1 tablet (40 mg total) by mouth every evening., Disp: 90 tablet, Rfl: 3  EXAM:  Filed Vitals:   08/20/15 0905  BP: 134/88  Pulse: 78  Temp: 97.6 F (36.4 C)    Body mass index is 30.62 kg/(m^2).  GENERAL: vitals reviewed and listed above, alert, oriented, appears well hydrated and in no acute distress  HEENT: atraumatic, conjunttiva clear, no obvious abnormalities on inspection of external nose and ears  NECK: no obvious masses on inspection, no carotid bruits  LUNGS: clear to auscultation bilaterally, no wheezes, rales or rhonchi, good air movement  CV: HRRR, no peripheral edema, no JVD, BP normal range, normal radial pulses  MS: moves all extremities without noticeable abnormality  PSYCH: pleasant and cooperative, no obvious depression or anxiety  ASSESSMENT AND PLAN:  Discussed the following assessment and plan: > 25 minutes spent caring for this patient with > 50% spent face to face in counseling. Preoperative examination  Osteoarthritis, unspecified osteoarthritis type, unspecified site  Assessment: -Risk factors: age, CAD/CVD -Surgery Risks:intermediate -age, nutritional status, fraility: good nutritional status, age >80, no fraility -functional capacity: estimated and per pt report activities 6-10 -comorbidities: CAD/CVD - sees cardiologist  Recommendations for optimizing general medical care prior to surgery: -Advise cardiology evaluation prior to surgery and any  recommendations for cardiac or CV testing come from cardiology. Advise management of CV medications perioperatively per cardiology recommendations. Pt already saw her cardiologist for per-op evaluation. -no other medical issues that I am aware of that would indicate the need to delay surgery -defer to surgeon to obtain CBC/BMP prior to surgery -advised patient to discuss specific risks morbidity and mortality of surgery with surgeon, CV risks discussed with patient -advised patient will defer to surgeon for post-op DVT prophylaxis and post op care -no specific medical recommendations for this patient at this time and no recommendations to defer surgery or for further CV testing prior to surgery -form for pre-op optimization of general medical care prior to surgery faxed to surgeon office   There are no Patient Instructions on file for this visit.   Colin Benton R.

## 2015-08-26 ENCOUNTER — Other Ambulatory Visit: Payer: Self-pay | Admitting: Orthopedic Surgery

## 2015-08-28 ENCOUNTER — Ambulatory Visit: Payer: Medicare Other | Admitting: Family Medicine

## 2015-09-03 NOTE — Pre-Procedure Instructions (Signed)
    Felicia Cross  09/03/2015      [id number] - [place], [place] - [address] [place] [id number] Phone: [phone] Fax: [phone]    Your procedure is scheduled on 09/14/15.  [facility] Admitting at 8 A.M.  Call this number if you have problems the morning of surgery:  [phone]   Remember:  Do not eat food or drink liquids after midnight.  Take these medicines the morning of surgery with A SIP OF WATER --lopressor   Do not wear jewelry, make-up or nail polish.  Do not wear lotions, powders, or perfumes.  You may wear deodorant.  Do not shave 48 hours prior to surgery.  Men may shave face and neck.  Do not bring valuables to the hospital.  [facility] is not responsible for any belongings or valuables.  Contacts, dentures or bridgework may not be worn into surgery.  Leave your suitcase in the car.  After surgery it may be brought to your room.  For patients admitted to the hospital, discharge time will be determined by your treatment team.  Patients discharged the day of surgery will not be allowed to drive home.   Name and phone number of your driver:    Special instructions:    Please read over the following fact sheets that you were given. Pain Booklet, Coughing and Deep Breathing, Blood Transfusion Information, MRSA Information and Surgical Site Infection Prevention

## 2015-09-04 ENCOUNTER — Encounter (HOSPITAL_COMMUNITY): Payer: Self-pay

## 2015-09-04 ENCOUNTER — Encounter (HOSPITAL_COMMUNITY)
Admission: RE | Admit: 2015-09-04 | Discharge: 2015-09-04 | Disposition: A | Discharge status: Home / Self Care | Payer: Medicare Other | Source: Ambulatory Visit | Attending: Orthopedic Surgery | Admitting: Orthopedic Surgery

## 2015-09-04 ENCOUNTER — Ambulatory Visit (HOSPITAL_COMMUNITY)
Admission: RE | Admit: 2015-09-04 | Discharge: 2015-09-04 | Disposition: A | Discharge status: Home / Self Care | Payer: Medicare Other | Source: Ambulatory Visit | Attending: Orthopedic Surgery | Admitting: Orthopedic Surgery

## 2015-09-04 DIAGNOSIS — Z01818 Encounter for other preprocedural examination: Secondary | ICD-10-CM | POA: Diagnosis not present

## 2015-09-04 LAB — CBC WITH DIFFERENTIAL/PLATELET
Basophils Absolute: 0 10*3/uL (ref 0.0–0.1)
Basophils Relative: 1 %
Eosinophils Absolute: 0.1 10*3/uL (ref 0.0–0.7)
Eosinophils Relative: 2 %
HEMATOCRIT: 37.8 % (ref 36.0–46.0)
HEMOGLOBIN: 12 g/dL (ref 12.0–15.0)
LYMPHS ABS: 2.9 10*3/uL (ref 0.7–4.0)
LYMPHS PCT: 42 %
MCH: 29.2 pg (ref 26.0–34.0)
MCHC: 31.7 g/dL (ref 30.0–36.0)
MCV: 92 fL (ref 78.0–100.0)
MONO ABS: 0.6 10*3/uL (ref 0.1–1.0)
MONOS PCT: 9 %
NEUTROS ABS: 3.1 10*3/uL (ref 1.7–7.7)
NEUTROS PCT: 46 %
Platelets: 272 10*3/uL (ref 150–400)
RBC: 4.11 MIL/uL (ref 3.87–5.11)
RDW: 14 % (ref 11.5–15.5)
WBC: 6.8 10*3/uL (ref 4.0–10.5)

## 2015-09-04 LAB — COMPREHENSIVE METABOLIC PANEL
ALK PHOS: 48 U/L (ref 38–126)
ALT: 12 U/L — ABNORMAL LOW (ref 14–54)
ANION GAP: 8 (ref 5–15)
AST: 19 U/L (ref 15–41)
Albumin: 3.6 g/dL (ref 3.5–5.0)
BILIRUBIN TOTAL: 0.4 mg/dL (ref 0.3–1.2)
BUN: 12 mg/dL (ref 6–20)
CALCIUM: 9.4 mg/dL (ref 8.9–10.3)
CO2: 29 mmol/L (ref 22–32)
Chloride: 101 mmol/L (ref 101–111)
Creatinine, Ser: 1.14 mg/dL — ABNORMAL HIGH (ref 0.44–1.00)
GFR calc Af Amer: 51 mL/min — ABNORMAL LOW (ref >60)
GFR, EST NON AFRICAN AMERICAN: 44 mL/min — AB (ref >60)
Glucose, Bld: 103 mg/dL — ABNORMAL HIGH (ref 65–99)
POTASSIUM: 4.2 mmol/L (ref 3.5–5.1)
Sodium: 138 mmol/L (ref 135–145)
Total Protein: 6.5 g/dL (ref 6.5–8.1)

## 2015-09-04 LAB — URINE MICROSCOPIC-ADD ON: RBC / HPF: NONE SEEN RBC/hpf (ref 0–5)

## 2015-09-04 LAB — TYPE AND SCREEN
ABO/RH(D): A POS
ANTIBODY SCREEN: NEGATIVE

## 2015-09-04 LAB — URINALYSIS, ROUTINE W REFLEX MICROSCOPIC
BILIRUBIN URINE: NEGATIVE
Glucose, UA: NEGATIVE mg/dL
HGB URINE DIPSTICK: NEGATIVE
KETONES UR: NEGATIVE mg/dL
NITRITE: NEGATIVE
PROTEIN: NEGATIVE mg/dL
Specific Gravity, Urine: 1.019 (ref 1.005–1.030)
pH: 7 (ref 5.0–8.0)

## 2015-09-04 LAB — PROTIME-INR
INR: 1.04 (ref 0.00–1.49)
Prothrombin Time: 13.8 seconds (ref 11.6–15.2)

## 2015-09-04 LAB — APTT: aPTT: 34 seconds (ref 24–37)

## 2015-09-04 LAB — SURGICAL PCR SCREEN
MRSA, PCR: NEGATIVE
Staphylococcus aureus: NEGATIVE

## 2015-09-06 LAB — URINE CULTURE

## 2015-09-09 ENCOUNTER — Telehealth: Payer: Self-pay | Admitting: Family Medicine

## 2015-09-09 DIAGNOSIS — Z7689 Persons encountering health services in other specified circumstances: Secondary | ICD-10-CM

## 2015-09-09 NOTE — Telephone Encounter (Signed)
Patient is having knee surgery and needs a handicap parking pass.  Patient was given a parking pass form and it was placed in the doctor's folder.

## 2015-09-10 NOTE — Telephone Encounter (Signed)
I called the pt and informed her a 3 month temporary handicapped placard application was left at the front desk and the staff will inform her of charges when she comes in to pick this up and she agreed.

## 2015-09-11 MED ORDER — SODIUM CHLORIDE 0.9 % IV SOLN
1000.0000 mg | INTRAVENOUS | Status: AC
  Administered 2015-09-14: 1000 mg via INTRAVENOUS
  Filled 2015-09-11: qty 10

## 2015-09-14 ENCOUNTER — Inpatient Hospital Stay (HOSPITAL_COMMUNITY): Payer: Medicare Other | Admitting: Certified Registered Nurse Anesthetist

## 2015-09-14 ENCOUNTER — Inpatient Hospital Stay (HOSPITAL_COMMUNITY)
Admission: RE | Admit: 2015-09-14 | Discharge: 2015-09-15 | DRG: 470 | Disposition: A | Discharge status: Home Health | Payer: Medicare Other | Source: Ambulatory Visit | Attending: Orthopedic Surgery | Admitting: Orthopedic Surgery

## 2015-09-14 ENCOUNTER — Encounter (HOSPITAL_COMMUNITY): Payer: Self-pay | Admitting: Certified Registered Nurse Anesthetist

## 2015-09-14 ENCOUNTER — Encounter (HOSPITAL_COMMUNITY)
Admission: RE | Disposition: A | Discharge status: Home / Self Care | Payer: Self-pay | Source: Ambulatory Visit | Attending: Orthopedic Surgery

## 2015-09-14 DIAGNOSIS — Z96652 Presence of left artificial knee joint: Secondary | ICD-10-CM | POA: Diagnosis present

## 2015-09-14 DIAGNOSIS — I679 Cerebrovascular disease, unspecified: Secondary | ICD-10-CM | POA: Diagnosis present

## 2015-09-14 DIAGNOSIS — M179 Osteoarthritis of knee, unspecified: Secondary | ICD-10-CM | POA: Diagnosis not present

## 2015-09-14 DIAGNOSIS — Z96641 Presence of right artificial hip joint: Secondary | ICD-10-CM | POA: Diagnosis present

## 2015-09-14 DIAGNOSIS — M1711 Unilateral primary osteoarthritis, right knee: Secondary | ICD-10-CM | POA: Diagnosis not present

## 2015-09-14 DIAGNOSIS — E785 Hyperlipidemia, unspecified: Secondary | ICD-10-CM | POA: Diagnosis present

## 2015-09-14 DIAGNOSIS — I2510 Atherosclerotic heart disease of native coronary artery without angina pectoris: Secondary | ICD-10-CM | POA: Diagnosis not present

## 2015-09-14 DIAGNOSIS — D62 Acute posthemorrhagic anemia: Secondary | ICD-10-CM | POA: Diagnosis not present

## 2015-09-14 DIAGNOSIS — Z96659 Presence of unspecified artificial knee joint: Secondary | ICD-10-CM

## 2015-09-14 DIAGNOSIS — I10 Essential (primary) hypertension: Secondary | ICD-10-CM | POA: Diagnosis not present

## 2015-09-14 DIAGNOSIS — M1990 Unspecified osteoarthritis, unspecified site: Secondary | ICD-10-CM | POA: Diagnosis present

## 2015-09-14 DIAGNOSIS — Z87891 Personal history of nicotine dependence: Secondary | ICD-10-CM | POA: Diagnosis not present

## 2015-09-14 DIAGNOSIS — G8918 Other acute postprocedural pain: Secondary | ICD-10-CM | POA: Diagnosis not present

## 2015-09-14 DIAGNOSIS — M25561 Pain in right knee: Secondary | ICD-10-CM | POA: Diagnosis present

## 2015-09-14 HISTORY — PX: TOTAL KNEE ARTHROPLASTY: SHX125

## 2015-09-14 LAB — CREATININE, SERUM
Creatinine, Ser: 1.09 mg/dL — ABNORMAL HIGH (ref 0.44–1.00)
GFR calc Af Amer: 54 mL/min — ABNORMAL LOW (ref >60)
GFR calc non Af Amer: 47 mL/min — ABNORMAL LOW (ref >60)

## 2015-09-14 LAB — CBC
HCT: 36.6 % (ref 36.0–46.0)
Hemoglobin: 11.7 g/dL — ABNORMAL LOW (ref 12.0–15.0)
MCH: 29.3 pg (ref 26.0–34.0)
MCHC: 32 g/dL (ref 30.0–36.0)
MCV: 91.7 fL (ref 78.0–100.0)
PLATELETS: 241 10*3/uL (ref 150–400)
RBC: 3.99 MIL/uL (ref 3.87–5.11)
RDW: 13.9 % (ref 11.5–15.5)
WBC: 8.1 10*3/uL (ref 4.0–10.5)

## 2015-09-14 SURGERY — ARTHROPLASTY, KNEE, TOTAL
Anesthesia: Regional | Site: Knee | Laterality: Right

## 2015-09-14 MED ORDER — LACTATED RINGERS IV SOLN
INTRAVENOUS | Status: DC
  Administered 2015-09-14 (×2): via INTRAVENOUS
  Administered 2015-09-14: 50 mL/h via INTRAVENOUS

## 2015-09-14 MED ORDER — FENTANYL CITRATE (PF) 100 MCG/2ML IJ SOLN
INTRAMUSCULAR | Status: AC
  Administered 2015-09-14: 50 ug via INTRAVENOUS
  Filled 2015-09-14: qty 2

## 2015-09-14 MED ORDER — METHOCARBAMOL 500 MG PO TABS
500.0000 mg | ORAL_TABLET | Freq: Four times a day (QID) | ORAL | Status: DC | PRN
  Administered 2015-09-14 – 2015-09-15 (×3): 500 mg via ORAL
  Filled 2015-09-14 (×3): qty 1

## 2015-09-14 MED ORDER — OXYCODONE HCL ER 10 MG PO T12A
10.0000 mg | EXTENDED_RELEASE_TABLET | Freq: Two times a day (BID) | ORAL | Status: DC
  Administered 2015-09-14 – 2015-09-15 (×3): 10 mg via ORAL
  Filled 2015-09-14 (×3): qty 1

## 2015-09-14 MED ORDER — SUGAMMADEX SODIUM 200 MG/2ML IV SOLN
INTRAVENOUS | Status: AC
  Filled 2015-09-14: qty 2

## 2015-09-14 MED ORDER — ONDANSETRON HCL 4 MG PO TABS: 4.0000 mg | ORAL_TABLET | Freq: Four times a day (QID) | ORAL | Status: DC | PRN

## 2015-09-14 MED ORDER — BUPIVACAINE-EPINEPHRINE (PF) 0.5% -1:200000 IJ SOLN
INTRAMUSCULAR | Status: AC
  Filled 2015-09-14: qty 30

## 2015-09-14 MED ORDER — PHENYLEPHRINE 40 MCG/ML (10ML) SYRINGE FOR IV PUSH (FOR BLOOD PRESSURE SUPPORT)
PREFILLED_SYRINGE | INTRAVENOUS | Status: AC
  Filled 2015-09-14: qty 20

## 2015-09-14 MED ORDER — SIMVASTATIN 40 MG PO TABS
40.0000 mg | ORAL_TABLET | Freq: Every day | ORAL | Status: DC
  Administered 2015-09-14 – 2015-09-15 (×2): 40 mg via ORAL
  Filled 2015-09-14 (×2): qty 1

## 2015-09-14 MED ORDER — HYDROMORPHONE HCL 1 MG/ML IJ SOLN
1.0000 mg | INTRAMUSCULAR | Status: DC | PRN
  Administered 2015-09-14: 1 mg via INTRAVENOUS
  Filled 2015-09-14: qty 1

## 2015-09-14 MED ORDER — FENTANYL CITRATE (PF) 100 MCG/2ML IJ SOLN
25.0000 ug | INTRAMUSCULAR | Status: DC | PRN
  Administered 2015-09-14 (×2): 50 ug via INTRAVENOUS

## 2015-09-14 MED ORDER — BUPIVACAINE LIPOSOME 1.3 % IJ SUSP
20.0000 mL | INTRAMUSCULAR | Status: DC
  Filled 2015-09-14: qty 20

## 2015-09-14 MED ORDER — CELECOXIB 200 MG PO CAPS
200.0000 mg | ORAL_CAPSULE | Freq: Two times a day (BID) | ORAL | Status: DC
  Administered 2015-09-14 – 2015-09-15 (×3): 200 mg via ORAL
  Filled 2015-09-14 (×3): qty 1

## 2015-09-14 MED ORDER — MIDAZOLAM HCL 2 MG/2ML IJ SOLN
INTRAMUSCULAR | Status: AC
  Administered 2015-09-14: 2 mg
  Filled 2015-09-14: qty 2

## 2015-09-14 MED ORDER — TRANEXAMIC ACID 1000 MG/10ML IV SOLN
1000.0000 mg | Freq: Once | INTRAVENOUS | Status: AC
  Administered 2015-09-14: 1000 mg via INTRAVENOUS
  Filled 2015-09-14: qty 10

## 2015-09-14 MED ORDER — FENTANYL CITRATE (PF) 250 MCG/5ML IJ SOLN
INTRAMUSCULAR | Status: AC
  Filled 2015-09-14: qty 5

## 2015-09-14 MED ORDER — HYDROCHLOROTHIAZIDE 25 MG PO TABS
25.0000 mg | ORAL_TABLET | Freq: Every day | ORAL | Status: DC
  Administered 2015-09-15: 25 mg via ORAL
  Filled 2015-09-14 (×2): qty 1

## 2015-09-14 MED ORDER — METOCLOPRAMIDE HCL 5 MG/ML IJ SOLN: 5.0000 mg | Freq: Three times a day (TID) | INTRAMUSCULAR | Status: DC | PRN

## 2015-09-14 MED ORDER — LIDOCAINE HCL (CARDIAC) 20 MG/ML IV SOLN
INTRAVENOUS | Status: DC | PRN
  Administered 2015-09-14: 60 mg via INTRAVENOUS

## 2015-09-14 MED ORDER — BUPIVACAINE-EPINEPHRINE (PF) 0.25% -1:200000 IJ SOLN
INTRAMUSCULAR | Status: AC
  Filled 2015-09-14: qty 30

## 2015-09-14 MED ORDER — ACETAMINOPHEN 325 MG PO TABS: 650.0000 mg | ORAL_TABLET | Freq: Four times a day (QID) | ORAL | Status: DC | PRN

## 2015-09-14 MED ORDER — FENTANYL CITRATE (PF) 100 MCG/2ML IJ SOLN
INTRAMUSCULAR | Status: DC | PRN
  Administered 2015-09-14: 50 ug via INTRAVENOUS
  Administered 2015-09-14: 150 ug via INTRAVENOUS

## 2015-09-14 MED ORDER — METOPROLOL TARTRATE 25 MG PO TABS
25.0000 mg | ORAL_TABLET | Freq: Two times a day (BID) | ORAL | Status: DC
  Administered 2015-09-14 (×2): 25 mg via ORAL
  Filled 2015-09-14 (×3): qty 1

## 2015-09-14 MED ORDER — SODIUM CHLORIDE 0.9 % IV SOLN
INTRAVENOUS | Status: DC
  Administered 2015-09-14 – 2015-09-15 (×2): via INTRAVENOUS

## 2015-09-14 MED ORDER — ZOLPIDEM TARTRATE 5 MG PO TABS: 5.0000 mg | ORAL_TABLET | Freq: Every evening | ORAL | Status: DC | PRN

## 2015-09-14 MED ORDER — 0.9 % SODIUM CHLORIDE (POUR BTL) OPTIME
TOPICAL | Status: DC | PRN
  Administered 2015-09-14: 1000 mL

## 2015-09-14 MED ORDER — ENOXAPARIN SODIUM 30 MG/0.3ML ~~LOC~~ SOLN
30.0000 mg | Freq: Two times a day (BID) | SUBCUTANEOUS | Status: DC
  Administered 2015-09-15: 30 mg via SUBCUTANEOUS
  Filled 2015-09-14: qty 0.3

## 2015-09-14 MED ORDER — TRAMADOL HCL 50 MG PO TABS
100.0000 mg | ORAL_TABLET | Freq: Four times a day (QID) | ORAL | Status: DC | PRN
  Administered 2015-09-14 – 2015-09-15 (×3): 100 mg via ORAL
  Filled 2015-09-14 (×3): qty 2

## 2015-09-14 MED ORDER — CHLORHEXIDINE GLUCONATE 4 % EX LIQD: 60.0000 mL | Freq: Once | CUTANEOUS | Status: DC

## 2015-09-14 MED ORDER — SODIUM CHLORIDE 0.9 % IV SOLN: INTRAVENOUS | Status: DC

## 2015-09-14 MED ORDER — BUPIVACAINE-EPINEPHRINE 0.5% -1:200000 IJ SOLN
INTRAMUSCULAR | Status: DC | PRN
  Administered 2015-09-14: 20 mL

## 2015-09-14 MED ORDER — PHENOL 1.4 % MT LIQD: 1.0000 | OROMUCOSAL | Status: DC | PRN

## 2015-09-14 MED ORDER — PROPOFOL 10 MG/ML IV BOLUS
INTRAVENOUS | Status: AC
  Filled 2015-09-14: qty 20

## 2015-09-14 MED ORDER — METOCLOPRAMIDE HCL 5 MG PO TABS: 5.0000 mg | ORAL_TABLET | Freq: Three times a day (TID) | ORAL | Status: DC | PRN

## 2015-09-14 MED ORDER — ROCURONIUM BROMIDE 100 MG/10ML IV SOLN
INTRAVENOUS | Status: DC | PRN
  Administered 2015-09-14: 40 mg via INTRAVENOUS

## 2015-09-14 MED ORDER — CEFAZOLIN SODIUM-DEXTROSE 2-4 GM/100ML-% IV SOLN: 2.0000 g | INTRAVENOUS | Status: DC

## 2015-09-14 MED ORDER — PROPOFOL 1000 MG/100ML IV EMUL
INTRAVENOUS | Status: AC
  Filled 2015-09-14: qty 200

## 2015-09-14 MED ORDER — ROCURONIUM BROMIDE 50 MG/5ML IV SOLN
INTRAVENOUS | Status: AC
  Filled 2015-09-14: qty 1

## 2015-09-14 MED ORDER — ACETAMINOPHEN 500 MG PO TABS
1000.0000 mg | ORAL_TABLET | Freq: Once | ORAL | Status: AC
Start: 2015-09-14 — End: 2015-09-14
  Administered 2015-09-14: 975 mg via ORAL

## 2015-09-14 MED ORDER — CEFAZOLIN SODIUM 1-5 GM-% IV SOLN
1.0000 g | Freq: Four times a day (QID) | INTRAVENOUS | Status: AC
  Administered 2015-09-14 (×2): 1 g via INTRAVENOUS
  Filled 2015-09-14 (×2): qty 50

## 2015-09-14 MED ORDER — DOCUSATE SODIUM 100 MG PO CAPS
100.0000 mg | ORAL_CAPSULE | Freq: Two times a day (BID) | ORAL | Status: DC
  Administered 2015-09-14 – 2015-09-15 (×3): 100 mg via ORAL
  Filled 2015-09-14 (×3): qty 1

## 2015-09-14 MED ORDER — ONDANSETRON HCL 4 MG/2ML IJ SOLN
INTRAMUSCULAR | Status: DC | PRN
  Administered 2015-09-14: 4 mg via INTRAVENOUS

## 2015-09-14 MED ORDER — BUPIVACAINE LIPOSOME 1.3 % IJ SUSP
INTRAMUSCULAR | Status: DC | PRN
  Administered 2015-09-14: 20 mL

## 2015-09-14 MED ORDER — PHENYLEPHRINE 40 MCG/ML (10ML) SYRINGE FOR IV PUSH (FOR BLOOD PRESSURE SUPPORT)
PREFILLED_SYRINGE | INTRAVENOUS | Status: AC
  Filled 2015-09-14: qty 10

## 2015-09-14 MED ORDER — DEXAMETHASONE SODIUM PHOSPHATE 10 MG/ML IJ SOLN
INTRAMUSCULAR | Status: AC
  Filled 2015-09-14: qty 1

## 2015-09-14 MED ORDER — SODIUM CHLORIDE 0.9 % IR SOLN
Status: DC | PRN
  Administered 2015-09-14: 1000 mL

## 2015-09-14 MED ORDER — SENNOSIDES-DOCUSATE SODIUM 8.6-50 MG PO TABS: 1.0000 | ORAL_TABLET | Freq: Every evening | ORAL | Status: DC | PRN

## 2015-09-14 MED ORDER — SODIUM CHLORIDE 0.9 % IJ SOLN
INTRAMUSCULAR | Status: DC | PRN
  Administered 2015-09-14: 20 mL

## 2015-09-14 MED ORDER — SUGAMMADEX SODIUM 200 MG/2ML IV SOLN
INTRAVENOUS | Status: DC | PRN
  Administered 2015-09-14: 200 mg via INTRAVENOUS

## 2015-09-14 MED ORDER — LOSARTAN POTASSIUM 50 MG PO TABS
50.0000 mg | ORAL_TABLET | Freq: Every day | ORAL | Status: DC
  Administered 2015-09-14 – 2015-09-15 (×2): 50 mg via ORAL
  Filled 2015-09-14 (×2): qty 1

## 2015-09-14 MED ORDER — FENTANYL CITRATE (PF) 100 MCG/2ML IJ SOLN
INTRAMUSCULAR | Status: AC
  Administered 2015-09-14: 75 ug
  Filled 2015-09-14: qty 2

## 2015-09-14 MED ORDER — ACETAMINOPHEN 650 MG RE SUPP: 650.0000 mg | Freq: Four times a day (QID) | RECTAL | Status: DC | PRN

## 2015-09-14 MED ORDER — DEXAMETHASONE SODIUM PHOSPHATE 10 MG/ML IJ SOLN
INTRAMUSCULAR | Status: DC | PRN
  Administered 2015-09-14: 10 mg via INTRAVENOUS

## 2015-09-14 MED ORDER — ONDANSETRON HCL 4 MG/2ML IJ SOLN
4.0000 mg | Freq: Four times a day (QID) | INTRAMUSCULAR | Status: DC | PRN
  Administered 2015-09-14: 4 mg via INTRAVENOUS
  Filled 2015-09-14: qty 2

## 2015-09-14 MED ORDER — METHOCARBAMOL 1000 MG/10ML IJ SOLN
500.0000 mg | Freq: Four times a day (QID) | INTRAVENOUS | Status: DC | PRN
  Filled 2015-09-14: qty 5

## 2015-09-14 MED ORDER — PROPOFOL 10 MG/ML IV BOLUS
INTRAVENOUS | Status: DC | PRN
  Administered 2015-09-14: 10 mg via INTRAVENOUS
  Administered 2015-09-14: 170 mg via INTRAVENOUS
  Administered 2015-09-14: 50 mg via INTRAVENOUS

## 2015-09-14 MED ORDER — FLEET ENEMA 7-19 GM/118ML RE ENEM: 1.0000 | ENEMA | Freq: Once | RECTAL | Status: DC | PRN

## 2015-09-14 MED ORDER — ONDANSETRON HCL 4 MG/2ML IJ SOLN
INTRAMUSCULAR | Status: AC
Start: 2015-09-14 — End: 2015-09-14
  Filled 2015-09-14: qty 2

## 2015-09-14 MED ORDER — CEFAZOLIN SODIUM-DEXTROSE 2-4 GM/100ML-% IV SOLN
INTRAVENOUS | Status: AC
  Administered 2015-09-14: 2 g via INTRAVENOUS
  Filled 2015-09-14: qty 100

## 2015-09-14 MED ORDER — ACETAMINOPHEN 325 MG PO TABS
ORAL_TABLET | ORAL | Status: AC
Start: 2015-09-14 — End: 2015-09-14
  Administered 2015-09-14: 975 mg via ORAL
  Filled 2015-09-14: qty 3

## 2015-09-14 MED ORDER — ALUM & MAG HYDROXIDE-SIMETH 200-200-20 MG/5ML PO SUSP: 30.0000 mL | ORAL | Status: DC | PRN

## 2015-09-14 MED ORDER — MENTHOL 3 MG MT LOZG: 1.0000 | LOZENGE | OROMUCOSAL | Status: DC | PRN

## 2015-09-14 MED ORDER — DIPHENHYDRAMINE HCL 12.5 MG/5ML PO ELIX: 12.5000 mg | ORAL_SOLUTION | ORAL | Status: DC | PRN

## 2015-09-14 MED ORDER — PHENYLEPHRINE HCL 10 MG/ML IJ SOLN
INTRAMUSCULAR | Status: DC | PRN
  Administered 2015-09-14 (×3): 80 ug via INTRAVENOUS

## 2015-09-14 MED ORDER — BISACODYL 5 MG PO TBEC: 5.0000 mg | DELAYED_RELEASE_TABLET | Freq: Every day | ORAL | Status: DC | PRN

## 2015-09-14 SURGICAL SUPPLY — 60 items
BANDAGE ESMARK 6X9 LF (GAUZE/BANDAGES/DRESSINGS) ×1 IMPLANT
BLADE SAGITTAL 13X1.27X60 (BLADE) ×2 IMPLANT
BLADE SAW SGTL 83.5X18.5 (BLADE) ×2 IMPLANT
BLADE SURG 10 STRL SS (BLADE) ×2 IMPLANT
BNDG ESMARK 6X9 LF (GAUZE/BANDAGES/DRESSINGS) ×2
BOWL SMART MIX CTS (DISPOSABLE) ×2 IMPLANT
CAPT KNEE TOTAL 3 ×2 IMPLANT
CEMENT BONE SIMPLEX SPEEDSET (Cement) ×4 IMPLANT
COVER SURGICAL LIGHT HANDLE (MISCELLANEOUS) ×2 IMPLANT
CUFF TOURNIQUET SINGLE 34IN LL (TOURNIQUET CUFF) ×2 IMPLANT
DRAPE EXTREMITY T 121X128X90 (DRAPE) ×2 IMPLANT
DRAPE INCISE IOBAN 66X45 STRL (DRAPES) ×4 IMPLANT
DRAPE PROXIMA HALF (DRAPES) IMPLANT
DRAPE U-SHAPE 47X51 STRL (DRAPES) ×2 IMPLANT
DRSG ADAPTIC 3X8 NADH LF (GAUZE/BANDAGES/DRESSINGS) ×2 IMPLANT
DRSG PAD ABDOMINAL 8X10 ST (GAUZE/BANDAGES/DRESSINGS) ×4 IMPLANT
DURAPREP 26ML APPLICATOR (WOUND CARE) ×4 IMPLANT
ELECT REM PT RETURN 9FT ADLT (ELECTROSURGICAL) ×2
ELECTRODE REM PT RTRN 9FT ADLT (ELECTROSURGICAL) ×1 IMPLANT
GAUZE SPONGE 4X4 12PLY STRL (GAUZE/BANDAGES/DRESSINGS) ×2 IMPLANT
GLOVE BIOGEL M 7.0 STRL (GLOVE) IMPLANT
GLOVE BIOGEL PI IND STRL 7.5 (GLOVE) IMPLANT
GLOVE BIOGEL PI IND STRL 8.5 (GLOVE) ×5 IMPLANT
GLOVE BIOGEL PI INDICATOR 7.5 (GLOVE)
GLOVE BIOGEL PI INDICATOR 8.5 (GLOVE) ×5
GLOVE SURG ORTHO 8.0 STRL STRW (GLOVE) ×12 IMPLANT
GOWN STRL REUS W/ TWL LRG LVL3 (GOWN DISPOSABLE) ×1 IMPLANT
GOWN STRL REUS W/ TWL XL LVL3 (GOWN DISPOSABLE) ×2 IMPLANT
GOWN STRL REUS W/TWL 2XL LVL3 (GOWN DISPOSABLE) ×2 IMPLANT
GOWN STRL REUS W/TWL LRG LVL3 (GOWN DISPOSABLE) ×1
GOWN STRL REUS W/TWL XL LVL3 (GOWN DISPOSABLE) ×4
HANDPIECE INTERPULSE COAX TIP (DISPOSABLE) ×2
HOOD PEEL AWAY FACE SHEILD DIS (HOOD) ×6 IMPLANT
KIT BASIN OR (CUSTOM PROCEDURE TRAY) ×2 IMPLANT
KIT ROOM TURNOVER OR (KITS) ×2 IMPLANT
KNEE CAPITATED TOTAL 3 ×1 IMPLANT
MANIFOLD NEPTUNE II (INSTRUMENTS) ×2 IMPLANT
NEEDLE 22X1 1/2 (OR ONLY) (NEEDLE) ×4 IMPLANT
NS IRRIG 1000ML POUR BTL (IV SOLUTION) ×2 IMPLANT
PACK TOTAL JOINT (CUSTOM PROCEDURE TRAY) ×2 IMPLANT
PACK UNIVERSAL I (CUSTOM PROCEDURE TRAY) ×2 IMPLANT
PAD ARMBOARD 7.5X6 YLW CONV (MISCELLANEOUS) ×4 IMPLANT
PADDING CAST COTTON 6X4 STRL (CAST SUPPLIES) ×2 IMPLANT
SET HNDPC FAN SPRY TIP SCT (DISPOSABLE) ×1 IMPLANT
SPONGE GAUZE 4X4 12PLY STER LF (GAUZE/BANDAGES/DRESSINGS) ×2 IMPLANT
STAPLER VISISTAT 35W (STAPLE) ×2 IMPLANT
SUCTION FRAZIER HANDLE 10FR (MISCELLANEOUS) ×1
SUCTION TUBE FRAZIER 10FR DISP (MISCELLANEOUS) ×1 IMPLANT
SUT BONE WAX W31G (SUTURE) ×2 IMPLANT
SUT VIC AB 0 CT1 27 (SUTURE) ×1
SUT VIC AB 0 CT1 27XBRD ANBCTR (SUTURE) ×1 IMPLANT
SUT VIC AB 0 CTB1 27 (SUTURE) ×4 IMPLANT
SUT VIC AB 1 CT1 27 (SUTURE) ×4
SUT VIC AB 1 CT1 27XBRD ANBCTR (SUTURE) ×2 IMPLANT
SUT VIC AB 2-0 CT1 27 (SUTURE) ×2
SUT VIC AB 2-0 CT1 TAPERPNT 27 (SUTURE) ×2 IMPLANT
SYR 20CC LL (SYRINGE) ×4 IMPLANT
TOWEL OR 17X24 6PK STRL BLUE (TOWEL DISPOSABLE) ×2 IMPLANT
TOWEL OR 17X26 10 PK STRL BLUE (TOWEL DISPOSABLE) ×2 IMPLANT
WATER STERILE IRR 1000ML POUR (IV SOLUTION) ×4 IMPLANT

## 2015-09-14 NOTE — Anesthesia Procedure Notes (Addendum)
Procedure Name: Intubation Date/Time: 09/14/2015 10:21 AM Performed by: Little IshikawaMERCER, KARI L Pre-anesthesia Checklist: Patient identified, Timeout performed, Emergency Drugs available, Suction available and Patient being monitored Patient Re-evaluated:Patient Re-evaluated prior to inductionOxygen Delivery Method: Circle system utilized Preoxygenation: Pre-oxygenation with 100% oxygen Intubation Type: IV induction Ventilation: Mask ventilation without difficulty Laryngoscope Size: Mac and 3 Grade View: Grade I Tube type: Oral Tube size: 7.5 mm Number of attempts: 1 Airway Equipment and Method: Stylet Placement Confirmation: ETT inserted through vocal cords under direct vision,  positive ETCO2 and breath sounds checked- equal and bilateral Secured at: 22 cm Tube secured with: Tape Dental Injury: Teeth and Oropharynx as per pre-operative assessment    Anesthesia Regional Block:  Adductor canal block  Pre-Anesthetic Checklist: ,, timeout performed, Correct Patient, Correct Site, Correct Laterality, Correct Procedure, Correct Position, site marked, Risks and benefits discussed,  Surgical consent,  Pre-op evaluation,  At surgeon's request and post-op pain management  Laterality: Right     Needles:  Injection technique: Single-shot  Needle Type: Stimulator Needle - 80          Additional Needles:  Procedures: Doppler guided, ultrasound guided (picture in chart) and nerve stimulator Adductor canal block  Nerve Stimulator or Paresthesia:  Response: 0.5 mA,   Additional Responses:   Narrative:  Start time: 09/14/2015 7:10 AM End time: 09/14/2015 7:25 AM

## 2015-09-14 NOTE — Progress Notes (Signed)
Orthopedic Tech Progress Note Patient Details:  Nicanor Bakeundria H Hoffman 12-Jun-1932 [id number]  CPM Right Knee CPM Right Knee: On Right Knee Flexion (Degrees): 9 Right Knee Extension (Degrees): 0 Additional Comments: trapeze bar paTIENT HELPER VIEWED ORDER FROM DOCTOR'S ORDER LIST  Nikki DomCrawford, Darl Brisbin 09/14/2015, 12:47 PM

## 2015-09-14 NOTE — Progress Notes (Signed)
Utilization review completed.  

## 2015-09-14 NOTE — Progress Notes (Signed)
PT Cancellation Note  Patient Details Name: Felicia Cross MRN: [id number] DOB: 1[name]0/27/1934   Cancelled Treatment:    Reason Eval/Treat Not Completed: Medical issues which prohibited therapy; patient just vomited and RN in to give medications.  Will attempt to see in AM.   [name] 09/14/2015, 5:30 PM  Sheran Lawlessyndi Kinzlie Harney, PT [phone] 09/14/2015

## 2015-09-14 NOTE — Anesthesia Postprocedure Evaluation (Signed)
Anesthesia Post Note  Patient: Nicanor Bakeundria H Ober  Procedure(s) Performed: Procedure(s) (LRB): TOTAL KNEE ARTHROPLASTY (Right)  Patient location during evaluation: PACU Anesthesia Type: General and Regional Level of consciousness: awake Vital Signs Assessment: post-procedure vital signs reviewed and stable Respiratory status: spontaneous breathing Cardiovascular status: stable Anesthetic complications: no    Last Vitals:  Filed Vitals:   09/14/15 1310 09/14/15 1423  BP: 152/77 163/74  Pulse: 65 65  Temp: 36.3 C 36.3 C  Resp: 11 16    Last Pain:  Filed Vitals:   09/14/15 1424  PainSc: 1                  EDWARDS,Lavaris Sexson

## 2015-09-14 NOTE — Progress Notes (Signed)
Orthopedic Tech Progress Note Patient Details:  Felicia Cross [birth year] [id number] Ortho visit put on cpm at 1930 Patient ID: Felicia BakeAundria H Lebow, female   DOB: [birth year], 82 y.o.   MRN: [id number]   Jennye MoccasinHughes, Hisae Decoursey Craig 09/14/2015, 7:28 PM

## 2015-09-14 NOTE — Transfer of Care (Signed)
Immediate Anesthesia Transfer of Care Note  Patient: Felicia Cross  Procedure(s) Performed: Procedure(s): TOTAL KNEE ARTHROPLASTY (Right)  Patient Location: PACU  Anesthesia Type:General and GA combined with regional for post-op pain  Level of Consciousness: awake, alert , oriented and sedated  Airway & Oxygen Therapy: Patient Spontanous Breathing and Patient connected to nasal cannula oxygen  Post-op Assessment: Report given to RN, Post -op Vital signs reviewed and stable and Patient moving all extremities X 4  Post vital signs: Reviewed and stable  Last Vitals:  Filed Vitals:   09/14/15 1000 09/14/15 1005  BP: 156/67 142/71  Pulse: 64 64  Temp:    Resp: 12 12    Last Pain:  Filed Vitals:   09/14/15 1157  PainSc: 3       Patients Stated Pain Goal: 3 (09/14/15 [id number])  Complications: No apparent anesthesia complications

## 2015-09-14 NOTE — H&P (Signed)
Felicia Cross MRN:  [id number] DOB/SEX:  01-17-1933/female  CHIEF COMPLAINT:  Painful right Knee  HISTORY: Patient is a 82 y.o. female presented with a history of pain in the right knee. Onset of symptoms was gradual starting a few years ago with gradually worsening course since that time. Patient has been treated conservatively with over-the-counter NSAIDs and activity modification. Patient currently rates pain in the knee at 10 out of 10 with activity. There is pain at night.  PAST MEDICAL HISTORY: Patient Active Problem List   Diagnosis Date Noted  . Preop cardiovascular exam 08/14/2015  . Carotid arterial disease (HCC) 05/27/2009  . Hyperlipemia 06/27/2008  . Coronary atherosclerosis 11/06/2006  . Essential hypertension 03/07/2006  . Osteoarthritis 03/07/2006   Past Medical History  Diagnosis Date  . Diverticulosis of colon   . Arthritis   . Hypertension   . Hyperlipidemia   . CVD (cerebrovascular disease)   . CAD (coronary artery disease)   . Osteoarthritis 03/07/2006    Qualifier: Diagnosis of  By: Cato Mulligan MD, Bruce     Past Surgical History  Procedure Laterality Date  . Abdominal hysterectomy  1984  . Replacement total knee  2007    left  . Total hip arthroplasty  2010    Dr. Turner Daniels, right     MEDICATIONS:   No prescriptions prior to admission    ALLERGIES:   Allergies  Allergen Reactions  . Codeine Nausea And Vomiting  . Hydrocodone Nausea And Vomiting    REVIEW OF SYSTEMS:  A comprehensive review of systems was negative except for: Musculoskeletal: positive for arthralgias   FAMILY HISTORY:   Family History  Problem Relation Age of Onset  . Diabetes Mother   . Hypertension Father   . Kidney disease Father   . Heart attack Sister   . Heart attack Brother   . Colon cancer Neg Hx   . Stomach cancer Neg Hx   . Esophageal cancer Neg Hx   . Rectal cancer Neg Hx     SOCIAL HISTORY:   Social History  Substance Use Topics  . Smoking status:  Former Smoker    Quit date: 11/01/1980  . Smokeless tobacco: Never Used  . Alcohol Use: 0.0 oz/week    0 Standard drinks or equivalent per week     Comment: occasional glass wine     EXAMINATION:  Vital signs in last 24 hours:    There were no vitals taken for this visit.  General Appearance:    Alert, cooperative, no distress, appears stated age  Head:    Normocephalic, without obvious abnormality, atraumatic  Eyes:    PERRL, conjunctiva/corneas clear, EOM's intact, fundi    benign, both eyes  Ears:    Normal TM's and external ear canals, both ears  Nose:   Nares normal, septum midline, mucosa normal, no drainage    or sinus tenderness  Throat:   Lips, mucosa, and tongue normal; teeth and gums normal  Neck:   Supple, symmetrical, trachea midline, no adenopathy;    thyroid:  no enlargement/tenderness/nodules; no carotid   bruit or JVD  Back:     Symmetric, no curvature, ROM normal, no CVA tenderness  Lungs:     Clear to auscultation bilaterally, respirations unlabored  Chest Wall:    No tenderness or deformity   Heart:    Regular rate and rhythm, S1 and S2 normal, no murmur, rub   or gallop  Breast Exam:    No tenderness, masses, or nipple abnormality  Abdomen:     Soft, non-tender, bowel sounds active all four quadrants,    no masses, no organomegaly  Genitalia:    Normal female without lesion, discharge or tenderness  Rectal:    Normal tone, normal prostate, no masses or tenderness;   guaiac negative stool  Extremities:   Extremities normal, atraumatic, no cyanosis or edema  Pulses:   2+ and symmetric all extremities  Skin:   Skin color, texture, turgor normal, no rashes or lesions  Lymph nodes:   Cervical, supraclavicular, and axillary nodes normal  Neurologic:   CNII-XII intact, normal strength, sensation and reflexes    throughout    Musculoskeletal:  ROM 0-120, Ligaments intact,  Imaging Review Plain radiographs demonstrate severe degenerative joint disease of the  right knee. The overall alignment is neutral. The bone quality appears to be excellent for age and reported activity level.  Assessment/Plan: Primary osteoarthritis, right knee   The patient history, physical examination and imaging studies are consistent with advanced degenerative joint disease of the right knee. The patient has failed conservative treatment.  The clearance notes were reviewed.  After discussion with the patient it was felt that Total Knee Replacement was indicated. The procedure,  risks, and benefits of total knee arthroplasty were presented and reviewed. The risks including but not limited to aseptic loosening, infection, blood clots, vascular injury, stiffness, patella tracking problems complications among others were discussed. The patient acknowledged the explanation, agreed to proceed with the plan.  Guy SandiferColby Alan Robbins 09/14/2015, 6:38 AM

## 2015-09-14 NOTE — Anesthesia Preprocedure Evaluation (Signed)
Anesthesia Evaluation  Patient identified by MRN, date of birth, ID band Patient awake    Reviewed: Allergy & Precautions, NPO status , Patient's Chart, lab work & pertinent test results  Airway Mallampati: II  TM Distance: >3 FB Neck ROM: Full    Dental   Pulmonary former smoker,    breath sounds clear to auscultation       Cardiovascular hypertension, + CAD and + Peripheral Vascular Disease   Rhythm:Regular Rate:Normal     Neuro/Psych    GI/Hepatic negative GI ROS, Neg liver ROS,   Endo/Other  negative endocrine ROS  Renal/GU negative Renal ROS     Musculoskeletal   Abdominal   Peds  Hematology   Anesthesia Other Findings   Reproductive/Obstetrics                             Anesthesia Physical Anesthesia Plan  ASA: III  Anesthesia Plan: General and Regional   Post-op Pain Management: GA combined w/ Regional for post-op pain   Induction: Intravenous  Airway Management Planned: Oral ETT  Additional Equipment:   Intra-op Plan:   Post-operative Plan: Extubation in OR  Informed Consent: I have reviewed the patients History and Physical, chart, labs and discussed the procedure including the risks, benefits and alternatives for the proposed anesthesia with the patient or authorized representative who has indicated his/her understanding and acceptance.   Dental advisory given  Plan Discussed with: CRNA and Anesthesiologist  Anesthesia Plan Comments:         Anesthesia Quick Evaluation

## 2015-09-15 ENCOUNTER — Encounter (HOSPITAL_COMMUNITY): Payer: Self-pay | Admitting: Orthopedic Surgery

## 2015-09-15 LAB — CBC
HEMATOCRIT: 34.9 % — AB (ref 36.0–46.0)
Hemoglobin: 11.3 g/dL — ABNORMAL LOW (ref 12.0–15.0)
MCH: 29.8 pg (ref 26.0–34.0)
MCHC: 32.4 g/dL (ref 30.0–36.0)
MCV: 92.1 fL (ref 78.0–100.0)
Platelets: 252 10*3/uL (ref 150–400)
RBC: 3.79 MIL/uL — AB (ref 3.87–5.11)
RDW: 14 % (ref 11.5–15.5)
WBC: 10.7 10*3/uL — AB (ref 4.0–10.5)

## 2015-09-15 LAB — BASIC METABOLIC PANEL
ANION GAP: 14 (ref 5–15)
BUN: 17 mg/dL (ref 6–20)
CO2: 25 mmol/L (ref 22–32)
Calcium: 8.9 mg/dL (ref 8.9–10.3)
Chloride: 99 mmol/L — ABNORMAL LOW (ref 101–111)
Creatinine, Ser: 0.75 mg/dL (ref 0.44–1.00)
GFR calc Af Amer: >60 mL/min (ref >60)
GLUCOSE: 134 mg/dL — AB (ref 65–99)
POTASSIUM: 4.3 mmol/L (ref 3.5–5.1)
Sodium: 138 mmol/L (ref 135–145)

## 2015-09-15 MED ORDER — ONDANSETRON HCL 4 MG PO TABS: 4.0000 mg | ORAL_TABLET | Freq: Four times a day (QID) | ORAL | Status: DC | PRN

## 2015-09-15 MED ORDER — ENOXAPARIN SODIUM 40 MG/0.4ML ~~LOC~~ SOLN: 40.0000 mg | SUBCUTANEOUS | Status: DC

## 2015-09-15 MED ORDER — METHOCARBAMOL 500 MG PO TABS: 500.0000 mg | ORAL_TABLET | Freq: Four times a day (QID) | ORAL | Status: DC | PRN

## 2015-09-15 MED ORDER — HYDROMORPHONE HCL 2 MG PO TABS: 2.0000 mg | ORAL_TABLET | ORAL | Status: DC | PRN

## 2015-09-15 MED ORDER — TRAMADOL HCL 50 MG PO TABS: 50.0000 mg | ORAL_TABLET | Freq: Four times a day (QID) | ORAL | Status: DC | PRN

## 2015-09-15 NOTE — Discharge Summary (Signed)
SPORTS MEDICINE & JOINT REPLACEMENT   Georgena Spurling, MD   Altamese Cabal, PA-C Laurier Nancy, PA-C [address] [place], [place], [place]  [address]                             [phone]  PATIENT ID: Felicia Cross        MRN:  [id number]          DOB/AGE: 80/24/1934 / 82 y.o.    DISCHARGE SUMMARY  ADMISSION DATE:    09/14/2015 DISCHARGE DATE:   09/15/2015   ADMISSION DIAGNOSIS: primary osteoarthritis right knee    DISCHARGE DIAGNOSIS:  primary osteoarthritis right knee    ADDITIONAL DIAGNOSIS: Active Problems:   S/P total knee replacement  Past Medical History  Diagnosis Date  . Diverticulosis of colon   . Arthritis   . Hypertension   . Hyperlipidemia   . CVD (cerebrovascular disease)   . CAD (coronary artery disease)   . Osteoarthritis 03/07/2006    Qualifier: Diagnosis of  By: Cato Mulligan MD, Bruce      PROCEDURE: Procedure(s): TOTAL KNEE ARTHROPLASTY on 09/14/2015  CONSULTS:     HISTORY:  See H&P in chart  HOSPITAL COURSE:  Felicia Cross is a 82 y.o. admitted on 09/14/2015 and found to have a diagnosis of primary osteoarthritis right knee.  After appropriate laboratory studies were obtained  they were taken to the operating room on 09/14/2015 and underwent Procedure(s): TOTAL KNEE ARTHROPLASTY.   They were given perioperative antibiotics:  Anti-infectives    Start     Dose/Rate Route Frequency Ordered Stop   09/14/15 1600  ceFAZolin (ANCEF) IVPB 1 g/50 mL premix     1 g 100 mL/hr over 30 Minutes Intravenous Every 6 hours 09/14/15 1336 09/14/15 2231   09/14/15 0829  ceFAZolin (ANCEF) 2-4 GM/100ML-% IVPB    Comments:  Ray Church   : cabinet override      09/14/15 0829 09/14/15 1037   09/14/15 0825  ceFAZolin (ANCEF) IVPB 2g/100 mL premix  Status:  Discontinued     2 g 200 mL/hr over 30 Minutes Intravenous On call to O.R. 09/14/15 0825 09/14/15 1334    .  Patient given tranexamic acid IV or topical and exparel intra-operatively.  Tolerated the procedure  well.    POD# 1: Vital signs were stable.  Patient denied Chest pain, shortness of breath, or calf pain.  Patient was started on Lovenox 30 mg subcutaneously twice daily at 8am.  Consults to PT, OT, and care management were made.  The patient was weight bearing as tolerated.  CPM was placed on the operative leg 0-90 degrees for 6-8 hours a day. When out of the CPM, patient was placed in the foam block to achieve full extension. Incentive spirometry was taught.  Dressing was changed.       POD #2, Continued  PT for ambulation and exercise program.  IV saline locked.  O2 discontinued.    The remainder of the hospital course was dedicated to ambulation and strengthening.   The patient was discharged on 1 Day Post-Op in  Good condition.  Blood products given:none  DIAGNOSTIC STUDIES: Recent vital signs: Patient Vitals for the past 24 hrs:  BP Temp Temp src Pulse Resp SpO2  09/15/15 0535 (!) 139/59 mmHg 98.2 F (36.8 C) Oral 71 16 98 %  09/15/15 0033 109/69 mmHg 97.7 F (36.5 C) Oral 86 14 98 %  09/14/15 2043 (!) 150/76  mmHg 97.6 F (36.4 C) Oral 72 16 98 %  09/14/15 1423 (!) 163/74 mmHg 97.4 F (36.3 C) Oral 65 16 98 %  09/14/15 1310 (!) 152/77 mmHg 97.3 F (36.3 C) - 65 11 99 %  09/14/15 1309 - - - 64 11 99 %  09/14/15 1300 - - - 64 11 99 %  09/14/15 1256 (!) 163/85 mmHg - - 65 10 99 %  09/14/15 1245 - - - 66 13 100 %       Recent laboratory studies:  Recent Labs  09/14/15 1408 09/15/15 0600  WBC 8.1 10.7*  HGB 11.7* 11.3*  HCT 36.6 34.9*  PLT 241 252    Recent Labs  09/14/15 1408 09/15/15 0600  NA  --  138  K  --  4.3  CL  --  99*  CO2  --  25  BUN  --  17  CREATININE 1.09* 0.75  GLUCOSE  --  134*  CALCIUM  --  8.9   Lab Results  Component Value Date   INR 1.04 09/04/2015   INR 3.07* 02/19/2009   INR 1.33 SLIGHT HEMOLYSIS 02/18/2009     Recent Radiographic Studies :  Dg Chest 2 View  09/04/2015  CLINICAL DATA:  Preop chest radiograph for total knee  arthroplasty. EXAM: CHEST  2 VIEW COMPARISON:  Radiographs and CT October 2010 FINDINGS: The cardiomediastinal contours are normal. Minimal atelectasis in the lingula. Pulmonary vasculature is normal. No consolidation, pleural effusion, or pneumothorax. No acute osseous abnormalities are seen. The previous calcified and noncalcified nodules on prior CT are not visualized radiographically. IMPRESSION: No active cardiopulmonary disease. Electronically Signed   By: Rubye Oaks M.D.   On: 09/04/2015 15:31    DISCHARGE INSTRUCTIONS: Discharge Instructions    CPM    Complete by:  As directed   Continuous passive motion machine (CPM):      Use the CPM from 0 to 90 for 4-6 hours per day.      You may increase by 10 per day.  You may break it up into 2 or 3 sessions per day.      Use CPM for 2 weeks or until you are told to stop.     Call MD / Call 911    Complete by:  As directed   If you experience chest pain or shortness of breath, CALL 911 and be transported to the hospital emergency room.  If you develope a fever above 101 F, pus (white drainage) or increased drainage or redness at the wound, or calf pain, call your surgeon's office.     Change dressing    Complete by:  As directed   Change dressing on tomorrow, then change the dressing daily with sterile 4 x 4 inch gauze dressing and apply TED hose.  You may clean the incision with alcohol prior to redressing.     Constipation Prevention    Complete by:  As directed   Drink plenty of fluids.  Prune juice may be helpful.  You may use a stool softener, such as Colace (over the counter) 100 mg twice a day.  Use MiraLax (over the counter) for constipation as needed.     Diet - low sodium heart healthy    Complete by:  As directed      Discharge instructions    Complete by:  As directed   INSTRUCTIONS AFTER JOINT REPLACEMENT   Remove items at home which could result in a fall. This includes throw  rugs or furniture in walking pathways ICE to  the affected joint every three hours while awake for 30 minutes at a time, for at least the first 3-5 days, and then as needed for pain and swelling.  Continue to use ice for pain and swelling. You may notice swelling that will progress down to the foot and ankle.  This is normal after surgery.  Elevate your leg when you are not up walking on it.   Continue to use the breathing machine you got in the hospital (incentive spirometer) which will help keep your temperature down.  It is common for your temperature to cycle up and down following surgery, especially at night when you are not up moving around and exerting yourself.  The breathing machine keeps your lungs expanded and your temperature down.   DIET:  As you were doing prior to hospitalization, we recommend a well-balanced diet.  DRESSING / WOUND CARE / SHOWERING  You may change your dressing 3-5 days after surgery.  Then change the dressing every day with sterile gauze.  Please use good hand washing techniques before changing the dressing.  Do not use any lotions or creams on the incision until instructed by your surgeon.  ACTIVITY  Increase activity slowly as tolerated, but follow the weight bearing instructions below.   No driving for 6 weeks or until further direction given by your physician.  You cannot drive while taking narcotics.  No lifting or carrying greater than 10 lbs. until further directed by your surgeon. Avoid periods of inactivity such as sitting longer than an hour when not asleep. This helps prevent blood clots.  You may return to work once you are authorized by your doctor.     WEIGHT BEARING   Weight bearing as tolerated with assist device (walker, cane, etc) as directed, use it as long as suggested by your surgeon or therapist, typically at least 4-6 weeks.   EXERCISES  Results after joint replacement surgery are often greatly improved when you follow the exercise, range of motion and muscle strengthening  exercises prescribed by your doctor. Safety measures are also important to protect the joint from further injury. Any time any of these exercises cause you to have increased pain or swelling, decrease what you are doing until you are comfortable again and then slowly increase them. If you have problems or questions, call your caregiver or physical therapist for advice.   Rehabilitation is important following a joint replacement. After just a few days of immobilization, the muscles of the leg can become weakened and shrink (atrophy).  These exercises are designed to build up the tone and strength of the thigh and leg muscles and to improve motion. Often times heat used for twenty to thirty minutes before working out will loosen up your tissues and help with improving the range of motion but do not use heat for the first two weeks following surgery (sometimes heat can increase post-operative swelling).   These exercises can be done on a training (exercise) mat, on the floor, on a table or on a bed. Use whatever works the best and is most comfortable for you.    Use music or television while you are exercising so that the exercises are a pleasant break in your day. This will make your life better with the exercises acting as a break in your routine that you can look forward to.   Perform all exercises about fifteen times, three times per day or as directed.  You  should exercise both the operative leg and the other leg as well.   Exercises include:   Quad Sets - Tighten up the muscle on the front of the thigh (Quad) and hold for 5-10 seconds.   Straight Leg Raises - With your knee straight (if you were given a brace, keep it on), lift the leg to 60 degrees, hold for 3 seconds, and slowly lower the leg.  Perform this exercise against resistance later as your leg gets stronger.  Leg Slides: Lying on your back, slowly slide your foot toward your buttocks, bending your knee up off the floor (only go as far as is  comfortable). Then slowly slide your foot back down until your leg is flat on the floor again.  Angel Wings: Lying on your back spread your legs to the side as far apart as you can without causing discomfort.  Hamstring Strength:  Lying on your back, push your heel against the floor with your leg straight by tightening up the muscles of your buttocks.  Repeat, but this time bend your knee to a comfortable angle, and push your heel against the floor.  You may put a pillow under the heel to make it more comfortable if necessary.   A rehabilitation program following joint replacement surgery can speed recovery and prevent re-injury in the future due to weakened muscles. Contact your doctor or a physical therapist for more information on knee rehabilitation.    CONSTIPATION  Constipation is defined medically as fewer than three stools per week and severe constipation as less than one stool per week.  Even if you have a regular bowel pattern at home, your normal regimen is likely to be disrupted due to multiple reasons following surgery.  Combination of anesthesia, postoperative narcotics, change in appetite and fluid intake all can affect your bowels.   YOU MUST use at least one of the following options; they are listed in order of increasing strength to get the job done.  They are all available over the counter, and you may need to use some, POSSIBLY even all of these options:    Drink plenty of fluids (prune juice may be helpful) and high fiber foods Colace 100 mg by mouth twice a day  Senokot for constipation as directed and as needed Dulcolax (bisacodyl), take with full glass of water  Miralax (polyethylene glycol) once or twice a day as needed.  If you have tried all these things and are unable to have a bowel movement in the first 3-4 days after surgery call either your surgeon or your primary doctor.    If you experience loose stools or diarrhea, hold the medications until you stool forms back  up.  If your symptoms do not get better within 1 week or if they get worse, check with your doctor.  If you experience "the worst abdominal pain ever" or develop nausea or vomiting, please contact the office immediately for further recommendations for treatment.   ITCHING:  If you experience itching with your medications, try taking only a single pain pill, or even half a pain pill at a time.  You can also use Benadryl over the counter for itching or also to help with sleep.   TED HOSE STOCKINGS:  Use stockings on both legs until for at least 2 weeks or as directed by physician office. They may be removed at night for sleeping.  MEDICATIONS:  See your medication summary on the "After Visit Summary" that nursing will review with you.  You may have some home medications which will be placed on hold until you complete the course of blood thinner medication.  It is important for you to complete the blood thinner medication as prescribed.  PRECAUTIONS:  If you experience chest pain or shortness of breath - call 911 immediately for transfer to the hospital emergency department.   If you develop a fever greater that 101 F, purulent drainage from wound, increased redness or drainage from wound, foul odor from the wound/dressing, or calf pain - CONTACT YOUR SURGEON.                                                   FOLLOW-UP APPOINTMENTS:  If you do not already have a post-op appointment, please call the office for an appointment to be seen by your surgeon.  Guidelines for how soon to be seen are listed in your "After Visit Summary", but are typically between 1-4 weeks after surgery.  OTHER INSTRUCTIONS:   Knee Replacement:  Do not place pillow under knee, focus on keeping the knee straight while resting. CPM instructions: 0-90 degrees, 2 hours in the morning, 2 hours in the afternoon, and 2 hours in the evening. Place foam block, curve side up under heel at all times except when in CPM or when walking.  DO  NOT modify, tear, cut, or change the foam block in any way.  MAKE SURE YOU:  Understand these instructions.  Get help right away if you are not doing well or get worse.    Thank you for letting us be a part of your medical care team.  It is a privilege we respect greatly.  We hope these instructions will help you stay on track for a fast and full recovery!     Driving restrictions    Complete by:  As directed   No driving for 4-6 weeks     Increase activity slowly as tolerated    Complete by:  As directed            DISCHARGE MEDICATIONS:     Medication List    STOP taking these medications        ALEVE 220 MG tablet  Generic drug:  naproxen sodium     aspirin EC 81 MG tablet      TAKE these medications        enoxaparin 40 MG/0.4ML injection  Commonly known as:  LOVENOX  Inject 0.4 mLs (40 mg total) into the skin daily.     hydrochlorothiazide 25 MG tablet  Commonly known as:  HYDRODIURIL  Take 1 tablet (25 mg total) by mouth daily.     HYDROmorphone 2 MG tablet  Commonly known as:  DILAUDID  Take 1 tablet (2 mg total) by mouth every 4 (four) hours as needed for severe pain (only for breakthrough pain).     losartan 50 MG tablet  Commonly known as:  COZAAR  Take 50 mg by mouth daily.     methocarbamol 500 MG tablet  Commonly known as:  ROBAXIN  Take 1-2 tablets (500-1,000 mg total) by mouth every 6 (six) hours as needed for muscle spasms.     metoprolol tartrate 25 MG tablet  Commonly known as:  LOPRESSOR  Take 1 tablet (25 mg total) by mouth 2 (two) times daily.     multivitamin with  minerals Tabs tablet  Take 1 tablet by mouth daily.     ondansetron 4 MG tablet  Commonly known as:  ZOFRAN  Take 1 tablet (4 mg total) by mouth every 6 (six) hours as needed for nausea.     simvastatin 40 MG tablet  Commonly known as:  ZOCOR  Take 1 tablet (40 mg total) by mouth every evening.     traMADol 50 MG tablet  Commonly known as:  ULTRAM  Take 1-2 tablets  (50-100 mg total) by mouth every 6 (six) hours as needed (for pain).        FOLLOW UP VISIT:       Follow-up Information    Follow up with Raymon MuttonLUCEY,STEPHEN D, MD. Call on 09/29/2015.   Specialty:  Orthopedic Surgery   Contact information:   [address] [place] [address] [phone]       DISPOSITION: HOME VS. SNF  CONDITION:  Good   Guy SandiferColby Alan Robbins 09/15/2015, 12:44 PM

## 2015-09-15 NOTE — Progress Notes (Signed)
Physical Therapy Treatment Patient Details Name: Felicia Cross MRN: [id number] DOB: 08-18-1932 Today's Date: 09/15/2015    History of Present Illness RTKA    PT Comments    The patient is ready for Dc. All education has been met.  Follow Up Recommendations  Home health PT;Supervision/Assistance - 24 hour     Equipment Recommendations       Recommendations for Other Services       Precautions / Restrictions Precautions Precautions: Knee;Fall Restrictions RLE Weight Bearing: Weight bearing as tolerated    Mobility  Bed Mobility   Bed Mobility: Sit to Supine       Sit to supine: Min guard   General bed mobility comments: patient manages her right leg  Transfers Overall transfer level: Needs assistance Equipment used: Rolling walker (2 wheeled) Transfers: Sit to/from Stand Sit to Stand: Min guard         General transfer comment: cues for R leg position  Ambulation/Gait Ambulation/Gait assistance: Supervision Ambulation Distance (Feet): 25 Feet Assistive device: Rolling walker (2 wheeled) Gait Pattern/deviations: Step-to pattern;Step-through pattern     General Gait Details: cues for sequence   Stairs Stairs: Yes Stairs assistance: Min assist Stair Management: No rails;One rail Right;One rail Left;Step to pattern;Forwards;With cane Number of Stairs: 4 General stair comments: spouse present for instructions on use of cane with rail and no rail. Patient has god strength of the R knee and tolerates weight.  Wheelchair Mobility    Modified Rankin (Stroke Patients Only)       Balance                                    Cognition Arousal/Alertness: Awake/alert                          Exercises Total Joint Exercises Ankle Circles/Pumps: AROM;Both;10 reps Quad Sets: AROM;Both;10 reps Towel Squeeze: AROM;10 reps Heel Slides: AROM;Right;10 reps Hip ABduction/ADduction: AROM;Right;10 reps Straight Leg Raises:  AROM;Right;10 reps    General Comments        Pertinent Vitals/Pain Pain Assessment: No/denies pain    Home Living                      Prior Function            PT Goals (current goals can now be found in the care plan section) Progress towards PT goals: Progressing toward goals    Frequency  7X/week    PT Plan      Co-evaluation             End of Session   Activity Tolerance: Patient tolerated treatment well Patient left: in bed;with call bell/phone within reach;with family/visitor present     Time: 4680-3212 PT Time Calculation (min) (ACUTE ONLY): 37 min  Charges:  $Gait Training: 8-22 mins $Therapeutic Exercise: 8-22 mins $Self Care/Home Management: 2023-01-21                    G Codes:      Claretha Cooper 09/15/2015, 5:07 PM

## 2015-09-15 NOTE — Progress Notes (Signed)
OT Cancellation Note  Patient Details Name: Nicanor Bakeundria H Polak MRN: [id number] DOB: 1933-01-10   Cancelled Treatment:    Reason Eval/Treat Not Completed:  (Wheelchair in room and pt about to d/c.)  Earlie RavelingStraub, Yoshie Kosel L OTR/L [phone] 09/15/2015, 4:21 PM

## 2015-09-15 NOTE — Care Management Note (Signed)
Case Management Note  Patient Details  Name: Felicia Cross MRN: [id number] Date of Birth: 1933/01/14  Subjective/Objective:                 S/p right total knee arthroplasty   Action/Plan[facility] MD [facility]. [facility] with [facility], they will deliver CPM to home. Patient stated that she has a rolling walker and 3N1. Patient stated that she will have family assisting her after discharge.      Expected Discharge Date:                  Expected Discharge Plan:  Home w Home Health Services  In-House Referral:  NA  Discharge planning Services  CM Consult  Post Acute Care Choice:  Durable Medical Equipment, Home Health Choice offered to:  Patient  DME Arranged:  CPM DME Agency:  [facility]  HH Arranged:  PT [facility]  Status of Service:  Completed, signed off  Medicare Important Message Given:    Date Medicare IM Given:    Medicare IM give by:    Date Additional Medicare IM Given:    Additional Medicare Important Message give by:     If discussed at Long Length of Stay Meetings, dates discussed:    Additional Comments:  Monica BectonKrieg, Sangita Zani Watson, RN 09/15/2015, 2:07 PM

## 2015-09-15 NOTE — Op Note (Signed)
TOTAL KNEE REPLACEMENT OPERATIVE NOTE:  09/14/2015  12:27 PM  PATIENT:  Felicia Cross  82 y.o. female  PRE-OPERATIVE DIAGNOSIS:  primary osteoarthritis right knee  POST-OPERATIVE DIAGNOSIS:  primary osteoarthritis right knee  PROCEDURE:  Procedure(s): TOTAL KNEE ARTHROPLASTY  SURGEON:  Surgeon(s): Dannielle Huh, MD  PHYSICIAN ASSISTANT: Laurier Nancy, [facility]  ANESTHESIA:   spinal  DRAINS: Hemovac  SPECIMEN: None  COUNTS:  Correct  TOURNIQUET:   Total Tourniquet Time Documented: Thigh (Right) - 44 minutes Total: Thigh (Right) - 44 minutes   DICTATION:  Indication for procedure:    The patient is a 82 y.o. female who has failed conservative treatment for primary osteoarthritis right knee.  Informed consent was obtained prior to anesthesia. The risks versus benefits of the operation were explain and in a way the patient can, and did, understand.   On the implant demand matching protocol, this patient scored 10.  Therefore, this patient did" "did not receive a polyethylene insert with vitamin E which is a high demand implant.  Description of procedure:     The patient was taken to the operating room and placed under anesthesia.  The patient was positioned in the usual fashion taking care that all body parts were adequately padded and/or protected.  I foley catheter was not placed.  A tourniquet was applied and the leg prepped and draped in the usual sterile fashion.  The extremity was exsanguinated with the esmarch and tourniquet inflated to 350 mmHg.  Pre-operative range of motion was normal.  The knee was in 5 degree of mild valgus.  A midline incision approximately 6-7 inches long was made with a #10 blade.  A new blade was used to make a parapatellar arthrotomy going 2-3 cm into the quadriceps tendon, over the patella, and alongside the medial aspect of the patellar tendon.  A synovectomy was then performed with the #10 blade and forceps. I then elevated the deep MCL off the  medial tibial metaphysis subperiosteally around to the semimembranosus attachment.    I everted the patella and used calipers to measure patellar thickness.  I used the reamer to ream down to appropriate thickness to recreate the native thickness.  I then removed excess bone with the rongeur and sagittal saw.  I used the appropriately sized template and drilled the three lug holes.  I then put the trial in place and measured the thickness with the calipers to ensure recreation of the native thickness.  The trial was then removed and the patella subluxed and the knee brought into flexion.  A homan retractor was place to retract and protect the patella and lateral structures.  A Z-retractor was place medially to protect the medial structures.  The extra-medullary alignment system was used to make cut the tibial articular surface perpendicular to the anamotic axis of the tibia and in 3 degrees of posterior slope.  The cut surface and alignment jig was removed.  I then used the intramedullary alignment guide to make a 4 valgus cut on the distal femur.  I then marked out the epicondylar axis on the distal femur.  The posterior condylar axis measured 5 degrees.  I then used the anterior referencing sizer and measured the femur to be a size 6.  The 4-In-1 cutting block was screwed into place in external rotation matching the posterior condylar angle, making our cuts perpendicular to the epicondylar axis.  Anterior, posterior and chamfer cuts were made with the sagittal saw.  The cutting block and cut pieces  were removed.  A lamina spreader was placed in 90 degrees of flexion.  The ACL, PCL, menisci, and posterior condylar osteophytes were removed.  A 12 mm spacer blocked was found to offer good flexion and extension gap balance after moderate in degree releasing.   The scoop retractor was then placed and the femoral finishing block was pinned in place.  The small sagittal saw was used as well as the lug drill to  finish the femur.  The block and cut surfaces were removed and the medullary canal hole filled with autograft bone from the cut pieces.  The tibia was delivered forward in deep flexion and external rotation.  A size E tray was selected and pinned into place centered on the medial 1/3 of the tibial tubercle.  The reamer and keel was used to prepare the tibia through the tray.    I then trialed with the size 6 femur, size E tibia, a 12 mm insert and the 35 patella.  I had excellent flexion/extension gap balance, excellent patella tracking.  Flexion was full and beyond 120 degrees; extension was zero.  These components were chosen and the staff opened them to me on the back table while the knee was lavaged copiously and the cement mixed.  The soft tissue was infiltrated with 60cc of exparel 1.3% through a 21 gauge needle.  I cemented in the components and removed all excess cement.  The polyethylene tibial component was snapped into place and the knee placed in extension while cement was hardening.  The capsule was infilltrated with 30cc of .25% Marcaine with epinephrine.  A hemovac was place in the joint exiting superolaterally.  A pain pump was place superomedially superficial to the arthrotomy.  Once the cement was hard, the tourniquet was let down.  Hemostasis was obtained.  The arthrotomy was closed with figure-8 #1 vicryl sutures.  The deep soft tissues were closed with #0 vicryls and the subcuticular layer closed with a running #2-0 vicryl.  The skin was reapproximated and closed with skin staples.  The wound was dressed with xeroform, 4 x4's, 2 ABD sponges, a single layer of webril and a TED stocking.   The patient was then awakened, extubated, and taken to the recovery room in stable condition.  BLOOD LOSS:  300cc DRAINS: 1 hemovac, 1 pain catheter COMPLICATIONS:  None.  PLAN OF CARE: Admit to inpatient   PATIENT DISPOSITION:  PACU - hemodynamically stable.   Delay start of Pharmacological  VTE agent (>24hrs) due to surgical blood loss or risk of bleeding:  not applicable  Please fax a copy of this op note to my office at [phone] (please only include page 1 and 2 of the Case Information op note)

## 2015-09-15 NOTE — Progress Notes (Signed)
SPORTS MEDICINE AND JOINT REPLACEMENT  Georgena SpurlingStephen Lucey, MD   [facility] PA-C [address], [place], [place]  [id number]                             [phone]   PROGRESS NOTE  Subjective:  negative for Chest Pain  negative for Shortness of Breath  negative for Nausea/Vomiting   negative for Calf Pain  negative for Bowel Movement   Tolerating Diet: yes         Patient reports pain as 6 on 0-10 scale.    Objective: Vital signs in last 24 hours:   Patient Vitals for the past 24 hrs:  BP Temp Temp src Pulse Resp SpO2 Weight  09/15/15 0535 (!) 139/59 mmHg 98.2 F (36.8 C) Oral 71 16 98 % -  09/15/15 0033 109/69 mmHg 97.7 F (36.5 C) Oral 86 14 98 % -  09/14/15 2043 (!) 150/76 mmHg 97.6 F (36.4 C) Oral 72 16 98 % -  09/14/15 1423 (!) 163/74 mmHg 97.4 F (36.3 C) Oral 65 16 98 % -  09/14/15 1310 (!) 152/77 mmHg 97.3 F (36.3 C) - 65 11 99 % -  09/14/15 1309 - - - 64 11 99 % -  09/14/15 1300 - - - 64 11 99 % -  09/14/15 1256 (!) 163/85 mmHg - - 65 10 99 % -  09/14/15 1245 - - - 66 13 100 % -  09/14/15 1243 (!) 157/87 mmHg - - 67 11 99 % -  09/14/15 1230 - - - - - 100 % -  09/14/15 1211 (!) 170/85 mmHg - - 69 13 100 % -  09/14/15 1157 (!) 163/89 mmHg - - 77 16 100 % -  09/14/15 1155 - 97.5 F (36.4 C) - - - - -  09/14/15 1005 (!) 142/71 mmHg - - 64 12 100 % -  09/14/15 1000 (!) 156/67 mmHg - - 64 12 100 % -  09/14/15 0955 (!) 153/74 mmHg - - 60 12 100 % -  09/14/15 0950 (!) 177/87 mmHg - - (!) 57 13 100 % -  09/14/15 0944 (!) 171/79 mmHg - - (!) 58 16 100 % -  09/14/15 0937 (!) 173/82 mmHg - - (!) 57 11 100 % -  09/14/15 0935 (!) 173/82 mmHg - - (!) 56 14 100 % -  09/14/15 0840 (!) 179/80 mmHg 97.5 F (36.4 C) Oral 62 20 100 % 79.379 kg (175 lb)    @flow {1959:LAST@   Intake/Output from previous day:   05/08 0701 - 05/09 0700 In: 2460 [P.O.:360; I.V.:2100] Out: 280 [Urine:250]   Intake/Output this shift:       Intake/Output      05/08 0701 - 05/09 0700  05/09 0701 - 05/10 0700   P.O. 360    I.V. (mL/kg) 2100 (26.5)    Total Intake(mL/kg) 2460 (31)    Urine (mL/kg/hr) 250    Blood 30    Total Output 280     Net +2180          Urine Occurrence 3 x       LABORATORY DATA:  Recent Labs  09/14/15 1408 09/15/15 0600  WBC 8.1 10.7*  HGB 11.7* 11.3*  HCT 36.6 34.9*  PLT 241 252    Recent Labs  09/14/15 1408  CREATININE 1.09*   Lab Results  Component Value Date   INR 1.04 09/04/2015   INR  3.07* 02/19/2009   INR 1.33 SLIGHT HEMOLYSIS 02/18/2009    Examination:  General appearance: alert and no distress Extremities: extremities normal, atraumatic, no cyanosis or edema  Wound Exam: clean, dry, intact   Drainage:  None: wound tissue dry  Motor Exam: Quadriceps and Hamstrings Intact  Sensory Exam: Superficial Peroneal, Deep Peroneal and Tibial normal   Assessment:    1 Day Post-Op  Procedure(s) (LRB): TOTAL KNEE ARTHROPLASTY (Right)  ADDITIONAL DIAGNOSIS:  Active Problems:   S/P total knee replacement  Acute Blood Loss Anemia   Plan: Physical Therapy as ordered Weight Bearing as Tolerated (WBAT)  DVT Prophylaxis:  Lovenox  DISCHARGE PLAN: Home  DISCHARGE NEEDS: HHPT   Patient looks good, will plan on D/C today once cleared from PT         Guy Sandifer 09/15/2015, 7:16 AM

## 2015-09-15 NOTE — Evaluation (Signed)
Physical Therapy Evaluation Patient Details Name: Felicia Cross MRN: [id number] DOB: 11-12-1932 Today's Date: 09/15/2015   History of Present Illness  RTKA  Clinical Impression  The patient is progressing very well today. Will practice steps. Plans for Dc today. Pt admitted with above diagnosis. Pt currently with functional limitations due to the deficits listed below (see PT Problem List).  Pt will benefit from skilled PT to increase their independence and safety with mobility to allow discharge to the venue listed below.       Follow Up Recommendations Home health PT;Supervision/Assistance - 24 hour    Equipment Recommendations  None recommended by PT    Recommendations for Other Services       Precautions / Restrictions Precautions Precautions: Knee;Fall Restrictions Weight Bearing Restrictions: Yes RLE Weight Bearing: Weight bearing as tolerated      Mobility  Bed Mobility Overal bed mobility: Needs Assistance Bed Mobility: Supine to Sit     Supine to sit: Min guard     General bed mobility comments: patient manages her right leg  Transfers Overall transfer level: Needs assistance Equipment used: Rolling walker (2 wheeled) Transfers: Sit to/from Stand Sit to Stand: Min guard         General transfer comment: from bed and toilet, cues for r leg position  Ambulation/Gait Ambulation/Gait assistance: Min assist Ambulation Distance (Feet): 10 Feet (then 60 ') Assistive device: Rolling walker (2 wheeled) Gait Pattern/deviations: Step-through pattern     General Gait Details: ccues for sequence  Stairs            Wheelchair Mobility    Modified Rankin (Stroke Patients Only)       Balance                                             Pertinent Vitals/Pain Pain Assessment: No/denies pain    Home Living Family/patient expects to be discharged to:: Private residence Living Arrangements: Spouse/significant other Available  Help at Discharge: Family Type of Home: House Home Access: Stairs to enter Entrance Stairs-Rails: None Entrance Stairs-Number of Steps: 2 Home Layout: Two level Home Equipment: Environmental consultantWalker - 2 wheels;Cane - single point;Bedside commode      Prior Function Level of Independence: Independent with assistive device(s)               Hand Dominance        Extremity/Trunk Assessment               Lower Extremity Assessment: RLE deficits/detail RLE Deficits / Details: + SLR, knee flexion to 70    Cervical / Trunk Assessment: Normal  Communication   Communication: No difficulties  Cognition Arousal/Alertness: Awake/alert Behavior During Therapy: WFL for tasks assessed/performed Overall Cognitive Status: Within Functional Limits for tasks assessed                      General Comments      Exercises Total Joint Exercises Ankle Circles/Pumps: AROM;Both;10 reps Quad Sets: AROM;Both;10 reps Towel Squeeze: AROM;10 reps Heel Slides: AROM;Right;10 reps Hip ABduction/ADduction: AROM;Right;10 reps Straight Leg Raises: AROM;Right;10 reps      Assessment/Plan    PT Assessment Patient needs continued PT services  PT Diagnosis Difficulty walking   PT Problem List Decreased strength;Decreased range of motion;Decreased activity tolerance;Decreased mobility;Decreased knowledge of use of DME;Decreased safety awareness;Decreased knowledge of precautions  PT Treatment Interventions  DME instruction;Gait training;Stair training;Functional mobility training;Therapeutic activities;Therapeutic exercise;Patient/family education   PT Goals (Current goals can be found in the Care Plan section) Acute Rehab PT Goals Patient Stated Goal: tp go home PT Goal Formulation: With patient/family Time For Goal Achievement: 09/17/15 Potential to Achieve Goals: Good    Frequency 7X/week   Barriers to discharge        Co-evaluation               End of Session   Activity  Tolerance: Patient tolerated treatment well Patient left: in chair;with call bell/phone within reach;with family/visitor present Nurse Communication: Mobility status         Time: 1110-1148 PT Time Calculation (min) (ACUTE ONLY): 38 min   Charges:   PT Evaluation $PT Eval Low Complexity: 1 Procedure PT Treatments $Gait Training: 8-22 mins $Therapeutic Activity: 8-22 mins   PT G Codes:        Felicia Cross 09/15/2015, 11:57 AM Felicia Cross PT [phone]

## 2015-09-16 DIAGNOSIS — M1711 Unilateral primary osteoarthritis, right knee: Secondary | ICD-10-CM | POA: Diagnosis not present

## 2015-09-16 DIAGNOSIS — Z7901 Long term (current) use of anticoagulants: Secondary | ICD-10-CM | POA: Diagnosis not present

## 2015-09-16 DIAGNOSIS — Z87891 Personal history of nicotine dependence: Secondary | ICD-10-CM | POA: Diagnosis not present

## 2015-09-16 DIAGNOSIS — I2510 Atherosclerotic heart disease of native coronary artery without angina pectoris: Secondary | ICD-10-CM | POA: Diagnosis not present

## 2015-09-16 DIAGNOSIS — I10 Essential (primary) hypertension: Secondary | ICD-10-CM | POA: Diagnosis not present

## 2015-09-16 DIAGNOSIS — Z471 Aftercare following joint replacement surgery: Secondary | ICD-10-CM | POA: Diagnosis not present

## 2015-09-16 DIAGNOSIS — Z96653 Presence of artificial knee joint, bilateral: Secondary | ICD-10-CM | POA: Diagnosis not present

## 2015-09-16 DIAGNOSIS — Z79891 Long term (current) use of opiate analgesic: Secondary | ICD-10-CM | POA: Diagnosis not present

## 2015-09-16 DIAGNOSIS — Z96641 Presence of right artificial hip joint: Secondary | ICD-10-CM | POA: Diagnosis not present

## 2015-09-17 DIAGNOSIS — I10 Essential (primary) hypertension: Secondary | ICD-10-CM | POA: Diagnosis not present

## 2015-09-17 DIAGNOSIS — Z471 Aftercare following joint replacement surgery: Secondary | ICD-10-CM | POA: Diagnosis not present

## 2015-09-17 DIAGNOSIS — Z96641 Presence of right artificial hip joint: Secondary | ICD-10-CM | POA: Diagnosis not present

## 2015-09-17 DIAGNOSIS — Z87891 Personal history of nicotine dependence: Secondary | ICD-10-CM | POA: Diagnosis not present

## 2015-09-17 DIAGNOSIS — Z7901 Long term (current) use of anticoagulants: Secondary | ICD-10-CM | POA: Diagnosis not present

## 2015-09-17 DIAGNOSIS — Z96653 Presence of artificial knee joint, bilateral: Secondary | ICD-10-CM | POA: Diagnosis not present

## 2015-09-17 DIAGNOSIS — Z79891 Long term (current) use of opiate analgesic: Secondary | ICD-10-CM | POA: Diagnosis not present

## 2015-09-17 DIAGNOSIS — I2510 Atherosclerotic heart disease of native coronary artery without angina pectoris: Secondary | ICD-10-CM | POA: Diagnosis not present

## 2015-09-18 DIAGNOSIS — I10 Essential (primary) hypertension: Secondary | ICD-10-CM | POA: Diagnosis not present

## 2015-09-18 DIAGNOSIS — Z471 Aftercare following joint replacement surgery: Secondary | ICD-10-CM | POA: Diagnosis not present

## 2015-09-18 DIAGNOSIS — Z87891 Personal history of nicotine dependence: Secondary | ICD-10-CM | POA: Diagnosis not present

## 2015-09-18 DIAGNOSIS — Z96653 Presence of artificial knee joint, bilateral: Secondary | ICD-10-CM | POA: Diagnosis not present

## 2015-09-18 DIAGNOSIS — Z7901 Long term (current) use of anticoagulants: Secondary | ICD-10-CM | POA: Diagnosis not present

## 2015-09-18 DIAGNOSIS — Z79891 Long term (current) use of opiate analgesic: Secondary | ICD-10-CM | POA: Diagnosis not present

## 2015-09-18 DIAGNOSIS — Z96641 Presence of right artificial hip joint: Secondary | ICD-10-CM | POA: Diagnosis not present

## 2015-09-18 DIAGNOSIS — I2510 Atherosclerotic heart disease of native coronary artery without angina pectoris: Secondary | ICD-10-CM | POA: Diagnosis not present

## 2015-09-19 DIAGNOSIS — Z96641 Presence of right artificial hip joint: Secondary | ICD-10-CM | POA: Diagnosis not present

## 2015-09-19 DIAGNOSIS — Z96653 Presence of artificial knee joint, bilateral: Secondary | ICD-10-CM | POA: Diagnosis not present

## 2015-09-19 DIAGNOSIS — I2510 Atherosclerotic heart disease of native coronary artery without angina pectoris: Secondary | ICD-10-CM | POA: Diagnosis not present

## 2015-09-19 DIAGNOSIS — I10 Essential (primary) hypertension: Secondary | ICD-10-CM | POA: Diagnosis not present

## 2015-09-19 DIAGNOSIS — Z79891 Long term (current) use of opiate analgesic: Secondary | ICD-10-CM | POA: Diagnosis not present

## 2015-09-19 DIAGNOSIS — Z471 Aftercare following joint replacement surgery: Secondary | ICD-10-CM | POA: Diagnosis not present

## 2015-09-19 DIAGNOSIS — Z7901 Long term (current) use of anticoagulants: Secondary | ICD-10-CM | POA: Diagnosis not present

## 2015-09-19 DIAGNOSIS — Z87891 Personal history of nicotine dependence: Secondary | ICD-10-CM | POA: Diagnosis not present

## 2015-09-21 DIAGNOSIS — Z79891 Long term (current) use of opiate analgesic: Secondary | ICD-10-CM | POA: Diagnosis not present

## 2015-09-21 DIAGNOSIS — Z87891 Personal history of nicotine dependence: Secondary | ICD-10-CM | POA: Diagnosis not present

## 2015-09-21 DIAGNOSIS — Z96641 Presence of right artificial hip joint: Secondary | ICD-10-CM | POA: Diagnosis not present

## 2015-09-21 DIAGNOSIS — Z471 Aftercare following joint replacement surgery: Secondary | ICD-10-CM | POA: Diagnosis not present

## 2015-09-21 DIAGNOSIS — I2510 Atherosclerotic heart disease of native coronary artery without angina pectoris: Secondary | ICD-10-CM | POA: Diagnosis not present

## 2015-09-21 DIAGNOSIS — Z7901 Long term (current) use of anticoagulants: Secondary | ICD-10-CM | POA: Diagnosis not present

## 2015-09-21 DIAGNOSIS — I10 Essential (primary) hypertension: Secondary | ICD-10-CM | POA: Diagnosis not present

## 2015-09-21 DIAGNOSIS — Z96653 Presence of artificial knee joint, bilateral: Secondary | ICD-10-CM | POA: Diagnosis not present

## 2015-09-23 DIAGNOSIS — I10 Essential (primary) hypertension: Secondary | ICD-10-CM | POA: Diagnosis not present

## 2015-09-23 DIAGNOSIS — Z7901 Long term (current) use of anticoagulants: Secondary | ICD-10-CM | POA: Diagnosis not present

## 2015-09-23 DIAGNOSIS — Z87891 Personal history of nicotine dependence: Secondary | ICD-10-CM | POA: Diagnosis not present

## 2015-09-23 DIAGNOSIS — Z471 Aftercare following joint replacement surgery: Secondary | ICD-10-CM | POA: Diagnosis not present

## 2015-09-23 DIAGNOSIS — Z96641 Presence of right artificial hip joint: Secondary | ICD-10-CM | POA: Diagnosis not present

## 2015-09-23 DIAGNOSIS — Z96653 Presence of artificial knee joint, bilateral: Secondary | ICD-10-CM | POA: Diagnosis not present

## 2015-09-23 DIAGNOSIS — I2510 Atherosclerotic heart disease of native coronary artery without angina pectoris: Secondary | ICD-10-CM | POA: Diagnosis not present

## 2015-09-23 DIAGNOSIS — Z79891 Long term (current) use of opiate analgesic: Secondary | ICD-10-CM | POA: Diagnosis not present

## 2015-09-25 DIAGNOSIS — I2510 Atherosclerotic heart disease of native coronary artery without angina pectoris: Secondary | ICD-10-CM | POA: Diagnosis not present

## 2015-09-25 DIAGNOSIS — I10 Essential (primary) hypertension: Secondary | ICD-10-CM | POA: Diagnosis not present

## 2015-09-25 DIAGNOSIS — Z471 Aftercare following joint replacement surgery: Secondary | ICD-10-CM | POA: Diagnosis not present

## 2015-09-25 DIAGNOSIS — Z7901 Long term (current) use of anticoagulants: Secondary | ICD-10-CM | POA: Diagnosis not present

## 2015-09-25 DIAGNOSIS — Z96653 Presence of artificial knee joint, bilateral: Secondary | ICD-10-CM | POA: Diagnosis not present

## 2015-09-25 DIAGNOSIS — Z79891 Long term (current) use of opiate analgesic: Secondary | ICD-10-CM | POA: Diagnosis not present

## 2015-09-25 DIAGNOSIS — Z96641 Presence of right artificial hip joint: Secondary | ICD-10-CM | POA: Diagnosis not present

## 2015-09-25 DIAGNOSIS — Z87891 Personal history of nicotine dependence: Secondary | ICD-10-CM | POA: Diagnosis not present

## 2015-09-28 ENCOUNTER — Telehealth: Payer: Self-pay | Admitting: Family Medicine

## 2015-09-28 ENCOUNTER — Ambulatory Visit (INDEPENDENT_AMBULATORY_CARE_PROVIDER_SITE_OTHER): Payer: Medicare Other | Admitting: Family Medicine

## 2015-09-28 ENCOUNTER — Encounter: Payer: Self-pay | Admitting: Family Medicine

## 2015-09-28 VITALS — BP 148/96 | HR 75 | Temp 97.9°F | Ht 64.0 in | Wt 175.6 lb

## 2015-09-28 DIAGNOSIS — I10 Essential (primary) hypertension: Secondary | ICD-10-CM | POA: Diagnosis not present

## 2015-09-28 DIAGNOSIS — Z471 Aftercare following joint replacement surgery: Secondary | ICD-10-CM | POA: Diagnosis not present

## 2015-09-28 DIAGNOSIS — I2510 Atherosclerotic heart disease of native coronary artery without angina pectoris: Secondary | ICD-10-CM | POA: Diagnosis not present

## 2015-09-28 DIAGNOSIS — Z96641 Presence of right artificial hip joint: Secondary | ICD-10-CM | POA: Diagnosis not present

## 2015-09-28 DIAGNOSIS — Z96653 Presence of artificial knee joint, bilateral: Secondary | ICD-10-CM | POA: Diagnosis not present

## 2015-09-28 DIAGNOSIS — Z7901 Long term (current) use of anticoagulants: Secondary | ICD-10-CM | POA: Diagnosis not present

## 2015-09-28 DIAGNOSIS — Z87891 Personal history of nicotine dependence: Secondary | ICD-10-CM | POA: Diagnosis not present

## 2015-09-28 DIAGNOSIS — Z79891 Long term (current) use of opiate analgesic: Secondary | ICD-10-CM | POA: Diagnosis not present

## 2015-09-28 MED ORDER — LOSARTAN POTASSIUM 100 MG PO TABS: 100.0000 mg | ORAL_TABLET | Freq: Every day | ORAL | Status: DC

## 2015-09-28 MED ORDER — HYDROCHLOROTHIAZIDE 25 MG PO TABS: 25.0000 mg | ORAL_TABLET | Freq: Every day | ORAL | Status: DC

## 2015-09-28 NOTE — Progress Notes (Signed)
HPI:  Acute visit for:  HTN: -meds: hctz , losartan , metoprolol 25 bid -had knee replacement 5/8  -PT noticed BP up today, had been good all other days -denies: CP, SOB, DOe, HA, vision changes -no new medicines or foods -denies pain, reports really has not had any trouble with pain, sometimes with therapy, but not much -taking 2 pain pill in morning and night  ROS: See pertinent positives and negatives per HPI.  Past Medical History  Diagnosis Date  . Diverticulosis of colon   . Arthritis   . Hypertension   . Hyperlipidemia   . CVD (cerebrovascular disease)   . CAD (coronary artery disease)   . Osteoarthritis 03/07/2006    Qualifier: Diagnosis of  By: Cato Mulligan MD, Bruce      Past Surgical History  Procedure Laterality Date  . Abdominal hysterectomy  1984  . Replacement total knee  2007    left  . Total hip arthroplasty  2010    Dr. Turner Daniels, right  . Total knee arthroplasty Right 09/14/2015    Procedure: TOTAL KNEE ARTHROPLASTY;  Surgeon: Dannielle Huh, MD;  Location: [facility] OR;  Service: Orthopedics;  Laterality: Right;    Family History  Problem Relation Age of Onset  . Diabetes Mother   . Hypertension Father   . Kidney disease Father   . Heart attack Sister   . Heart attack Brother   . Colon cancer Neg Hx   . Stomach cancer Neg Hx   . Esophageal cancer Neg Hx   . Rectal cancer Neg Hx     Social History   Social History  . Marital Status: Married    Spouse Name: N/A  . Number of Children: N/A  . Years of Education: N/A   Social History Main Topics  . Smoking status: Former Smoker    Quit date: 11/01/1980  . Smokeless tobacco: Never Used  . Alcohol Use: 0.0 oz/week    0 Standard drinks or equivalent per week     Comment: occasional glass wine  . Drug Use: No  . Sexual Activity: Not Asked   Other Topics Concern  . None   Social History Narrative   Updated 01/07/15   Work or School: retired from Geneticist, molecular, volunteers at [employer]. [place]      Home 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, Information systems manager and two great grandchildren age 71 and 61      Spiritual Beliefs: Christian      Lifestyle: regular exercise at the [facility]; healthy diet           Current outpatient prescriptions:  .  enoxaparin (LOVENOX) 40 MG/0.4ML injection, Inject 0.4 mLs (40 mg total) into the skin daily., Disp: 13 Syringe, Rfl: 0 .  hydrochlorothiazide (HYDRODIURIL) 25 MG tablet, Take 1 tablet (25 mg total) by mouth daily., Disp: 90 tablet, Rfl: 3 .  HYDROmorphone (DILAUDID) 2 MG tablet, Take 1 tablet (2 mg total) by mouth every 4 (four) hours as needed for severe pain (only for breakthrough pain)., Disp: 60 tablet, Rfl: 0 .  losartan (COZAAR) 100 MG tablet, Take 1 tablet (100 mg total) by mouth daily., Disp: 90 tablet, Rfl: 3 .  methocarbamol (ROBAXIN) 500 MG tablet, Take 1-2 tablets (500-1,000 mg total) by mouth every 6 (six) hours as needed for muscle spasms., Disp: 60 tablet, Rfl: 0 .  metoprolol tartrate (LOPRESSOR) 25 MG tablet, Take 1 tablet (25 mg total) by mouth 2 (two) times daily., Disp: 180 tablet, Rfl: 3 .  Multiple Vitamin (MULTIVITAMIN  WITH MINERALS) TABS, Take 1 tablet by mouth daily. , Disp: , Rfl:  .  ondansetron (ZOFRAN) 4 MG tablet, Take 1 tablet (4 mg total) by mouth every 6 (six) hours as needed for nausea., Disp: 20 tablet, Rfl: 0 .  simvastatin (ZOCOR) 40 MG tablet, Take 1 tablet (40 mg total) by mouth every evening. (Patient taking differently: Take 40 mg by mouth daily. ), Disp: 90 tablet, Rfl: 3 .  traMADol (ULTRAM) 50 MG tablet, Take 1-2 tablets (50-100 mg total) by mouth every 6 (six) hours as needed (for pain)., Disp: 60 tablet, Rfl: 0  EXAM:  Filed Vitals:   09/28/15 1611  BP: 148/96  Pulse: 75  Temp: 97.9 F (36.6 C)    Body mass index is 30.13 kg/(m^2).  GENERAL: vitals reviewed and listed above, alert, oriented, appears well hydrated and in no acute distress  HEENT: atraumatic, conjunttiva clear, no obvious abnormalities on  inspection of external nose and ears  NECK: no obvious masses on inspection  LUNGS: clear to auscultation bilaterally, no wheezes, rales or rhonchi, good air movement  CV: HRRR, no peripheral edema  MS: moves all extremities without noticeable abnormality  PSYCH: pleasant and cooperative, no obvious depression or anxiety  ASSESSMENT AND PLAN:  Discussed the following assessment and plan:  Essential hypertension  -DBP a little better on recheck, opted to increase losartan to 100mg  -follow up in 2-4 weeks -since no pain, advise tapering off narcotic pain medications as tolerated and discussed risks, tylenol in safe dosing levels -Patient advised to return or notify a doctor immediately if symptoms worsen or persist or new concerns arise.  Patient Instructions  BEFORE YOU LEAVE: -follow up in about 2-4 weeks  Increase losartan to 100mg  daily  Continue your other blood pressure medications  Try to taper off of the pain medications as tolerated     Felicia Cross R.

## 2015-09-28 NOTE — Progress Notes (Signed)
Pre visit review using our clinic review tool, if applicable. No additional management support is needed unless otherwise documented below in the visit note. 

## 2015-09-28 NOTE — Telephone Encounter (Signed)
I called Felicia Cross and informed her of the message below and she stated she is not at the pts home at this time.  Stated she was checking vitals as per the discharge from her surgery and noted the BP was elevated.  I called the pt and scheduled an appt for today at 4:30pm.

## 2015-09-28 NOTE — Telephone Encounter (Signed)
Revonda Standardllison is calling to report  Patient bp ranges from 162/89 up to 177/110. Pt is not have any symptoms such as headache. Please advise

## 2015-09-28 NOTE — Patient Instructions (Signed)
BEFORE YOU LEAVE: -follow up in about 2-4 weeks  Increase losartan to 100mg  daily  Continue your other blood pressure medications  Try to taper off of the pain medications as tolerated

## 2015-09-28 NOTE — Telephone Encounter (Signed)
Please help her schedule appointment with me. Bring home cuff is has one.

## 2015-09-29 DIAGNOSIS — Z96651 Presence of right artificial knee joint: Secondary | ICD-10-CM | POA: Diagnosis not present

## 2015-10-01 DIAGNOSIS — M25661 Stiffness of right knee, not elsewhere classified: Secondary | ICD-10-CM | POA: Diagnosis not present

## 2015-10-01 DIAGNOSIS — R262 Difficulty in walking, not elsewhere classified: Secondary | ICD-10-CM | POA: Diagnosis not present

## 2015-10-01 DIAGNOSIS — Z96651 Presence of right artificial knee joint: Secondary | ICD-10-CM | POA: Diagnosis not present

## 2015-10-06 ENCOUNTER — Ambulatory Visit (INDEPENDENT_AMBULATORY_CARE_PROVIDER_SITE_OTHER): Payer: Medicare Other | Admitting: Family Medicine

## 2015-10-06 ENCOUNTER — Encounter: Payer: Self-pay | Admitting: Family Medicine

## 2015-10-06 VITALS — BP 94/70 | HR 77 | Temp 97.6°F | Resp 12 | Ht 64.0 in | Wt 168.4 lb

## 2015-10-06 DIAGNOSIS — R1030 Lower abdominal pain, unspecified: Secondary | ICD-10-CM

## 2015-10-06 DIAGNOSIS — I10 Essential (primary) hypertension: Secondary | ICD-10-CM

## 2015-10-06 DIAGNOSIS — R112 Nausea with vomiting, unspecified: Secondary | ICD-10-CM | POA: Diagnosis not present

## 2015-10-06 LAB — POC URINALSYSI DIPSTICK (AUTOMATED)
BILIRUBIN UA: NEGATIVE
Blood, UA: NEGATIVE
GLUCOSE UA: NEGATIVE
Ketones, UA: NEGATIVE
Nitrite, UA: NEGATIVE
Protein, UA: 15
UROBILINOGEN UA: 0.2
pH, UA: 6

## 2015-10-06 LAB — BASIC METABOLIC PANEL
BUN: 17 mg/dL (ref 6–23)
CHLORIDE: 97 meq/L (ref 96–112)
CO2: 31 meq/L (ref 19–32)
Calcium: 10 mg/dL (ref 8.4–10.5)
Creatinine, Ser: 1.24 mg/dL — ABNORMAL HIGH (ref 0.40–1.20)
GFR: 53.45 mL/min — ABNORMAL LOW (ref >60.00)
GLUCOSE: 110 mg/dL — AB (ref 70–99)
POTASSIUM: 3.9 meq/L (ref 3.5–5.1)
Sodium: 137 mEq/L (ref 135–145)

## 2015-10-06 LAB — CBC
HEMATOCRIT: 37.1 % (ref 36.0–46.0)
HEMOGLOBIN: 12 g/dL (ref 12.0–15.0)
MCHC: 32.3 g/dL (ref 30.0–36.0)
MCV: 90.7 fl (ref 78.0–100.0)
Platelets: 491 10*3/uL — ABNORMAL HIGH (ref 150.0–400.0)
RBC: 4.1 Mil/uL (ref 3.87–5.11)
RDW: 15.1 % (ref 11.5–15.5)
WBC: 8.3 10*3/uL (ref 4.0–10.5)

## 2015-10-06 MED ORDER — PROMETHAZINE HCL 25 MG/ML IJ SOLN: 25.0000 mg | Freq: Once | INTRAMUSCULAR | Status: DC

## 2015-10-06 MED ORDER — LOSARTAN POTASSIUM 100 MG PO TABS: ORAL_TABLET | ORAL | Status: DC

## 2015-10-06 NOTE — Progress Notes (Signed)
Subjective:   [name] is a 82 y.o. female , who is here today with her husband complains of 3-4 days of suprapubic pressure like pain, she thinks it may be a UTI.   Pain is not radiated, 1-2/10 in intensity, happens first thing in the morning and sometimes around noon.  + Burping. + decreased appetite and drinking less than usual according to husband.  She denies any fever, chills, or MS changes. No Dysuria, urinary frequency or urgency, urine incontinence, or gross hematuria.  + Nausea and vomiting (started while she was here), digested food content, yellowish. Denies any vaginal bleeding or discharge  Sexual activity: no Hx of UTI: no  She has not tried OTC medication, taking Zofran for nausea.  Recently right TKR, 09/14/15, no complications and recovering well. She is on Dilaudid for pain, down to once daily. Denies changes in bowel habits, last bowel movement this morning, no blood.   Hypertension: Her BP is mildly low today. BP at home today 117/60's. Currently she is on Cozaar 50 mg 1.5 tab daily and HCTZ, already took both medications. She tells me she is not on Metoprolol (on her medication list). . She is taking medications as instructed, no side effects reported.  She has not noted unusual headache, visual changes, exertional chest pain, dyspnea,  focal weakness, or edema.  + Dizziness once here when she got up to go to the bathroom; denies any at home.  Lab Results  Component Value Date   CREATININE 0.75 09/15/2015   BUN 17 09/15/2015   NA 138 09/15/2015   K 4.3 09/15/2015   CL 99* 09/15/2015   CO2 25 09/15/2015     Review of Systems:  CONSTITUTIONAL: Negative for fever, chills, abnormal wt loss ,or fatigue. + Decreased appetite.  HENT: No visual changes, oral lesions, odynophagia, or difficulty swallowing.  HEART:Denies chest pain, palpitations/irregular heart rate, exertional dyspnea, or edema.  LUNG: Denies cough, dyspnea, or  wheezing.  ABDOMEN: Negative for vomiting, or changes in bowel habits.+ Nausea and suprapubic abdominal pain. +Vomiting while she was in the room.  ZO:XWRUEA gross hematuria or incontinence.  Negative dysuria, urinary frequency or urgency, or decreased urine output. No abnormal vaginal bleeding or discharge.  VW:UJWJXBJY for associated myalgias, joint edema/erythema, or back pain.  NEURO: No MS changes, weakness,syncope, or unusual headache.+ Mild dizziness.  SKIN: Negative for erythematous rash or skin lesions or color changes.     Current Outpatient Prescriptions on File Prior to Visit  Medication Sig Dispense Refill  . enoxaparin (LOVENOX) 40 MG/0.4ML injection Inject 0.4 mLs (40 mg total) into the skin daily. 13 Syringe 0  . hydrochlorothiazide (HYDRODIURIL) 25 MG tablet Take 1 tablet (25 mg total) by mouth daily. 90 tablet 3  . HYDROmorphone (DILAUDID) 2 MG tablet Take 1 tablet (2 mg total) by mouth every 4 (four) hours as needed for severe pain (only for breakthrough pain). 60 tablet 0  . methocarbamol (ROBAXIN) 500 MG tablet Take 1-2 tablets (500-1,000 mg total) by mouth every 6 (six) hours as needed for muscle spasms. 60 tablet 0  . metoprolol tartrate (LOPRESSOR) 25 MG tablet Take 1 tablet (25 mg total) by mouth 2 (two) times daily. 180 tablet 3  . Multiple Vitamin (MULTIVITAMIN WITH MINERALS) TABS Take 1 tablet by mouth daily.     . ondansetron (ZOFRAN) 4 MG tablet Take 1 tablet (4 mg total) by mouth every 6 (six) hours as needed for nausea. 20 tablet 0  .  simvastatin (ZOCOR) 40 MG tablet Take 1 tablet (40 mg total) by mouth every evening. (Patient taking differently: Take 40 mg by mouth daily. ) 90 tablet 3  . traMADol (ULTRAM) 50 MG tablet Take 1-2 tablets (50-100 mg total) by mouth every 6 (six) hours as needed (for pain). 60 tablet 0   No current facility-administered medications on file prior to visit.     Past Medical History  Diagnosis Date  . Diverticulosis of  colon   . Arthritis   . Hypertension   . Hyperlipidemia   . CVD (cerebrovascular disease)   . CAD (coronary artery disease)   . Osteoarthritis 03/07/2006    Qualifier: Diagnosis of  By: Cato MulliganSwords MD, Bruce      Social History   Social History  . Marital Status: Married    Spouse Name: N/A  . Number of Children: N/A  . Years of Education: N/A   Social History Main Topics  . Smoking status: Former Smoker    Quit date: 11/01/1980  . Smokeless tobacco: Never Used  . Alcohol Use: 0.0 oz/week    0 Standard drinks or equivalent per week     Comment: occasional glass wine  . Drug Use: No  . Sexual Activity: Not Asked   Other Topics Concern  . 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      Home 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, Information systems managergrandaughter and two great grandchildren age [phone] and [phone]      Spiritual Beliefs: Christian      Lifestyle: regular exercise at the [employer] [facility] [facility] [employer]; healthy diet          Filed Vitals:   10/06/15 1221  BP: 94/70  Pulse: 77  Temp: 97.6 F (36.4 C)  Resp: 12   Body mass index is 28.89 kg/(m^2).  SpO2 Readings from Last 3 Encounters:  10/06/15 97%  09/15/15 99%  09/04/15 97%      Objective:   GENERAL: Well developed, no in acute distress.  HEENT: Conjunctivae clear, mildly dry oral mucosa, no lesions appreciated, no oropharyngeal erythema.  CV: No murmur appreciated, regular rhythm and rate. DP pulses 2+ bilateral.  RESPIRATORY: Clear to auscultation bilateral with no rhonchi, rales, or wheezing.  ABDOMEN: Soft, no tender, no masses or megaly appreciated. No CVA tenderness bilateral.  EXTREMITIES: No LE edema bilateral, no erythema or tenderness.  SKIN: Right knee linear healing incision, no erythema, no edema, no tenderness.  NEURO: Alert and oriented x 3, no focal deficit, stable gait,using a cane.  PSYCH: Appropriate mood and affect, well groomed, good eye  contact.   Laboratory:   Urine dip today: + for leukocytes, rest otherwise negative.    Assessment and Plan:   CBC    Component Value Date/Time   WBC 8.3 10/06/2015 1324   RBC 4.10 10/06/2015 1324   HGB 12.0 10/06/2015 1324   HCT 37.1 10/06/2015 1324   PLT 491.0* 10/06/2015 1324   MCV 90.7 10/06/2015 1324   MCH 29.8 09/15/2015 0600   MCHC 32.3 10/06/2015 1324   RDW 15.1 10/06/2015 1324   LYMPHSABS 2.9 09/04/2015 1506   MONOABS 0.6 09/04/2015 1506   EOSABS 0.1 09/04/2015 1506   BASOSABS 0.0 09/04/2015 1506   Lab Results  Component Value Date   CREATININE 1.24* 10/06/2015   BUN 17 10/06/2015   NA 137 10/06/2015   K 3.9 10/06/2015   CL 97 10/06/2015   CO2  31 10/06/2015      Elara was seen today for abdominal pain.  Diagnoses and all orders for this visit:  Non-intractable vomiting with nausea, unspecified vomiting type  One episode of vomiting, feeling better after. She refused Phenergan, states that she is feeling "great", lightheadedness resolved as well. Continue Zofran 4 mg tid, she has some left. Stop Dilaudid. Clearly instructed about warning signs.  -     Basic metabolic panel   Abdominal pain, lower   Possible causes discussed, I am not certain it is related to UTI, no urinary symptoms reported. She prefers to hold on abx treatment for now. Monitor for new symptoms.  Ucx ordered.   Clearly instructed about warning signs. F/U in 7-10 days.   -     POCT Urinalysis Dipstick (Automated) -     Culture, Urine -     CBC -     Basic metabolic panel  Essential hypertension  Re-checked 90/60. Decrease Losartan from 75 mg to 50 mg, no changes for now in HCTZ. Monitor BP at home. Fall precautions. F/U in 7-10 days.  -     losartan (COZAAR) 100 MG tablet; Take 1 tab daily (decreased from 1.5 tab=75 mg).      Ms Gherardi and her husband agree with plan and voice understanding.    Betty G. Sw[place], MD  [facility]. [place]  office.

## 2015-10-06 NOTE — Progress Notes (Signed)
Pre visit review using our clinic review tool, if applicable. No additional management support is needed unless otherwise documented below in the visit note. 

## 2015-10-06 NOTE — Patient Instructions (Addendum)
A few things to remember from today's visit:   1. Abdominal pain, lower  It is not clear to me this is a UTI. We agreed in waiting for urine culture.  - POCT Urinalysis Dipstick (Automated) - Culture, Urine - CBC  2. Nausea with vomiting Continue Zofran. Wean off pain medication.  Small sips of clear fluids. Bland, small portions of food as tolerated.   If urine symptoms start please let me know.   3. Hypertension:  Blood pressure is low today.Decrease Cozaar from 75 mg to 50 mg, no changes in HCTZ. Increase fluid intake. Monitor blood pressure at home.   I have seen you today for an acute visit because your primary care provider was not available. Monitor for signs of worsening symptoms and seek immediate medical attention if any concerning symptom as we discussed. Follow up in 5-7 days, before if worsening symptoms.

## 2015-10-08 LAB — URINE CULTURE

## 2015-10-09 ENCOUNTER — Ambulatory Visit: Payer: Medicare Other | Admitting: Family Medicine

## 2015-10-09 ENCOUNTER — Telehealth: Payer: Self-pay | Admitting: Family Medicine

## 2015-10-09 ENCOUNTER — Telehealth: Payer: Self-pay

## 2015-10-09 NOTE — Telephone Encounter (Signed)
Continue Zofran 4 mg tid as needed. Continue oral fluid intake, try chicken broth or soups as tolerated. Try OTC Zantac 150 mg bid. Monitor for fever, urinary sx, changes in bowel habits, vomiting, or MS changes. Keep follow up appt with pcp as planned. Thanks, BJ

## 2015-10-09 NOTE — Telephone Encounter (Signed)
Spoke to patient. Gave instructions. Patient verbalized understanding.  

## 2015-10-09 NOTE — Telephone Encounter (Signed)
Pt states the dr advised her to purchase a med at the drug store.  However, grandson spilled water all over the note,  and she can not read what it says. Would like a call back.

## 2015-10-09 NOTE — Telephone Encounter (Signed)
-----   Message from Betty G SwazilandJordan, MD sent at 10/09/2015  8:10 AM EDT ----- Felicia ItoUCx is not suggestive of UTI, it did not grow a specific bacteria but rather multiple, which may indicate contamination.This results + clinical Hx do not suggest UTI. If pain continues other causes may need to be considered/investigated. How is pain, nausea, and appetite? Any vomiting or changes in bowel habits? She has a f/u appt coming. Thanks!

## 2015-10-09 NOTE — Telephone Encounter (Signed)
Spoke to patient. Gave name and mg of OTC med.

## 2015-10-09 NOTE — Telephone Encounter (Signed)
Spoke to patient. Gave lab results. Patient verbalized understanding. Patient confirmed upcoming appt on 10/26/2015.  Patient current symptoms are:  Abd Pain-no pain Nausea-still very nauseous, food does not increase or decrease nausea Appetite-decreased Vomiting-none Bowels-regular Fluids-has increased water intake; has been drinking ginger ale -Advised patient will inform Dr. SwazilandJordan. Patient agreed.

## 2015-10-13 ENCOUNTER — Telehealth: Payer: Self-pay | Admitting: Family Medicine

## 2015-10-13 ENCOUNTER — Ambulatory Visit (INDEPENDENT_AMBULATORY_CARE_PROVIDER_SITE_OTHER): Payer: Medicare Other | Admitting: Family Medicine

## 2015-10-13 ENCOUNTER — Encounter: Payer: Self-pay | Admitting: Family Medicine

## 2015-10-13 VITALS — BP 130/84 | HR 90 | Temp 98.0°F | Ht 64.0 in | Wt 168.2 lb

## 2015-10-13 DIAGNOSIS — D508 Other iron deficiency anemias: Secondary | ICD-10-CM | POA: Diagnosis not present

## 2015-10-13 DIAGNOSIS — Z96651 Presence of right artificial knee joint: Secondary | ICD-10-CM | POA: Diagnosis not present

## 2015-10-13 DIAGNOSIS — R262 Difficulty in walking, not elsewhere classified: Secondary | ICD-10-CM | POA: Diagnosis not present

## 2015-10-13 DIAGNOSIS — M25661 Stiffness of right knee, not elsewhere classified: Secondary | ICD-10-CM | POA: Diagnosis not present

## 2015-10-13 DIAGNOSIS — I10 Essential (primary) hypertension: Secondary | ICD-10-CM | POA: Diagnosis not present

## 2015-10-13 DIAGNOSIS — R110 Nausea: Secondary | ICD-10-CM | POA: Diagnosis not present

## 2015-10-13 DIAGNOSIS — R7309 Other abnormal glucose: Secondary | ICD-10-CM

## 2015-10-13 LAB — COMPREHENSIVE METABOLIC PANEL
ALT: 8 U/L (ref 0–35)
AST: 17 U/L (ref 0–37)
Albumin: 3.8 g/dL (ref 3.5–5.2)
Alkaline Phosphatase: 56 U/L (ref 39–117)
BUN: 11 mg/dL (ref 6–23)
CHLORIDE: 94 meq/L — AB (ref 96–112)
CO2: 32 meq/L (ref 19–32)
Calcium: 9.6 mg/dL (ref 8.4–10.5)
Creatinine, Ser: 0.92 mg/dL (ref 0.40–1.20)
GFR: 75.42 mL/min (ref >60.00)
GLUCOSE: 111 mg/dL — AB (ref 70–99)
POTASSIUM: 3.6 meq/L (ref 3.5–5.1)
SODIUM: 135 meq/L (ref 135–145)
Total Bilirubin: 0.5 mg/dL (ref 0.2–1.2)
Total Protein: 6.6 g/dL (ref 6.0–8.3)

## 2015-10-13 LAB — POCT URINALYSIS DIPSTICK
Bilirubin, UA: NEGATIVE
GLUCOSE UA: NEGATIVE
Ketones, UA: NEGATIVE
NITRITE UA: NEGATIVE
RBC UA: NEGATIVE
SPEC GRAV UA: 1.01
UROBILINOGEN UA: 0.2
pH, UA: 7

## 2015-10-13 LAB — CBC WITH DIFFERENTIAL/PLATELET
BASOS PCT: 0.8 % (ref 0.0–3.0)
Basophils Absolute: 0.1 10*3/uL (ref 0.0–0.1)
EOS PCT: 5 % (ref 0.0–5.0)
Eosinophils Absolute: 0.4 10*3/uL (ref 0.0–0.7)
HCT: 35.7 % — ABNORMAL LOW (ref 36.0–46.0)
Hemoglobin: 11.4 g/dL — ABNORMAL LOW (ref 12.0–15.0)
LYMPHS ABS: 1.8 10*3/uL (ref 0.7–4.0)
Lymphocytes Relative: 24.8 % (ref 12.0–46.0)
MCHC: 32 g/dL (ref 30.0–36.0)
MCV: 89.8 fl (ref 78.0–100.0)
MONO ABS: 0.8 10*3/uL (ref 0.1–1.0)
MONOS PCT: 10.7 % (ref 3.0–12.0)
NEUTROS ABS: 4.4 10*3/uL (ref 1.4–7.7)
NEUTROS PCT: 58.7 % (ref 43.0–77.0)
PLATELETS: 352 10*3/uL (ref 150.0–400.0)
RBC: 3.98 Mil/uL (ref 3.87–5.11)
RDW: 14.5 % (ref 11.5–15.5)
WBC: 7.4 10*3/uL (ref 4.0–10.5)

## 2015-10-13 LAB — LIPASE: LIPASE: 27 U/L (ref 11.0–59.0)

## 2015-10-13 NOTE — Telephone Encounter (Signed)
I called the pt and she stated she still has nausea, does not feel herself and Zofran has not helped.  Appt scheduled for today at 1pm with Dr Selena BattenKim.

## 2015-10-13 NOTE — Telephone Encounter (Signed)
Pt would like a call back of importance would not elaborate on the matter.

## 2015-10-13 NOTE — Progress Notes (Signed)
Pre visit review using our clinic review tool, if applicable. No additional management support is needed unless otherwise documented below in the visit note. 

## 2015-10-13 NOTE — Progress Notes (Signed)
HPI:  Nausea: -started acutely about a week ago -symptoms: suprapubic pain initially, anorexia, nausea, vomited a few times initially -on dilaudid for pain from recent knee replacement, tapering -saw Dr. SwazilandJordan for this 5/30 and had labs with mild elevation in platelets and cr; 3+leuks on dip, multiple org on ucx -today reports no further vomiting or abdominal pain, only remaining symptom is mild nausea intermittently -She did her physical therapy for her knee today and did fine with this -She is no longer taking the Dilaudid, she stopped this about 1 week ago -Since stopping the Dilaudid she has been taking Aleve -She has been taking the Zofran twice daily -denies: Sore throat, sinus issues, cough, shortness of breath, chest pain, burping or nausea with exertion, fevers, changes in bowels, diarrhea, melena or hematochezia, dysuria, headache, malaise  HTN: -meds:hctz, lopressor 25 bid, losartan increased recently -no CP, Ha, SOB -bp improved   ROS: See pertinent positives and negatives per HPI.  Past Medical History  Diagnosis Date  . Diverticulosis of colon   . Arthritis   . Hypertension   . Hyperlipidemia   . CVD (cerebrovascular disease)   . CAD (coronary artery disease)   . Osteoarthritis 03/07/2006    Qualifier: Diagnosis of  By: Cato MulliganSwords MD, Bruce      Past Surgical History  Procedure Laterality Date  . Abdominal hysterectomy  1984  . Replacement total knee  2007    left  . Total hip arthroplasty  2010    Dr. Turner Danielsowan, right  . Total knee arthroplasty Right 09/14/2015    Procedure: TOTAL KNEE ARTHROPLASTY;  Surgeon: Dannielle HuhSteve Lucey, MD;  Location: [facility] OR;  Service: Orthopedics;  Laterality: Right;    Family History  Problem Relation Age of Onset  . Diabetes Mother   . Hypertension Father   . Kidney disease Father   . Heart attack Sister   . Heart attack Brother   . Colon cancer Neg Hx   . Stomach cancer Neg Hx   . Esophageal cancer Neg Hx   . Rectal cancer Neg Hx      Social History   Social History  . Marital Status: Married    Spouse Name: N/A  . Number of Children: N/A  . Years of Education: N/A   Social History Main Topics  . Smoking status: Former Smoker    Quit date: 11/01/1980  . Smokeless tobacco: Never Used  . Alcohol Use: 0.0 oz/week    0 Standard drinks or equivalent per week     Comment: occasional glass wine  . Drug Use: No  . Sexual Activity: Not Asked   Other Topics Concern  . 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      Home 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, Information systems managergrandaughter and two great grandchildren age [phone] and [phone]      Spiritual Beliefs: Christian      Lifestyle: regular exercise at the [employer]; healthy diet           Current outpatient prescriptions:  .  enoxaparin (LOVENOX) 40 MG/0.4ML injection, Inject 0.4 mLs (40 mg total) into the skin daily., Disp: 13 Syringe, Rfl: 0 .  hydrochlorothiazide (HYDRODIURIL) 25 MG tablet, Take 1 tablet (25 mg total) by mouth daily., Disp: 90 tablet, Rfl: 3 .  HYDROmorphone (DILAUDID) 2 MG tablet, Take 1 tablet (2 mg total) by mouth every 4 (four) hours as needed for severe pain (only  for breakthrough pain)., Disp: 60 tablet, Rfl: 0 .  losartan (COZAAR) 100 MG tablet, Take 1 tab daily (decreased from 1.5 tab=75 mg)., Disp: 30 tablet, Rfl: 0 .  methocarbamol (ROBAXIN) 500 MG tablet, Take 1-2 tablets (500-1,000 mg total) by mouth every 6 (six) hours as needed for muscle spasms., Disp: 60 tablet, Rfl: 0 .  metoprolol tartrate (LOPRESSOR) 25 MG tablet, Take 1 tablet (25 mg total) by mouth 2 (two) times daily., Disp: 180 tablet, Rfl: 3 .  Multiple Vitamin (MULTIVITAMIN WITH MINERALS) TABS, Take 1 tablet by mouth daily. , Disp: , Rfl:  .  ondansetron (ZOFRAN) 4 MG tablet, Take 1 tablet (4 mg total) by mouth every 6 (six) hours as needed for nausea., Disp: 20 tablet, Rfl: 0 .  simvastatin (ZOCOR) 40 MG tablet, Take  1 tablet (40 mg total) by mouth every evening. (Patient taking differently: Take 40 mg by mouth daily. ), Disp: 90 tablet, Rfl: 3 .  traMADol (ULTRAM) 50 MG tablet, Take 1-2 tablets (50-100 mg total) by mouth every 6 (six) hours as needed (for pain)., Disp: 60 tablet, Rfl: 0  EXAM:  Filed Vitals:   10/13/15 1302  BP: 130/84  Pulse: 102  Temp: 98 F (36.7 C)    Body mass index is 28.86 kg/(m^2).  GENERAL: vitals reviewed and listed above, alert, oriented, appears well hydrated and in no acute distress  HEENT: atraumatic, conjunttiva clear, no obvious abnormalities on inspection of external nose and ears  NECK: no obvious masses on inspection  LUNGS: clear to auscultation bilaterally, no wheezes, rales or rhonchi, good air movement  CV: HRRR, no peripheral edema  ABD: BS+, soft, NTTP  MS: moves all extremities without noticeable abnormality  PSYCH: pleasant and cooperative, no obvious depression or anxiety  ASSESSMENT AND PLAN:  Discussed the following assessment and plan:  Nausea - Plan: POC Urinalysis Dipstick, Culture, Urine, Lipase, CMP, CBC with Differential/Platelets, H. pylori antigen, stool  Essential hypertension  -we discussed possible serious and likely etiologies, workup and treatment, treatment risks and return precautions  -pain and vomiting improved, benign exam -repeat UA, culture, BMP, cbc, lipase, h pylori -nexium  daily for 1 week as suspect viral illness with mild gastritis -stop nsaids and use tylenol if needed for pain -stop zofran since not helping -consider decreasing losartan if nausea persists -follow up 1 week, discussed imaging and opted for GI eval and/or CT if symptoms persist -Patient advised to return or notify a doctor immediately if symptoms worsen or persist or new concerns arise.  There are no Patient Instructions on file for this visit.   Kriste Basque R.

## 2015-10-13 NOTE — Patient Instructions (Signed)
BEFORE YOU LEAVE: -labs -follow up 1 week  No dairy for 1 week  nexium 20mg  daily for 1 week (available over the counter)  Stop the aleve and the zofran. Use tylenol for pain only if needed and not more then 1000mg  3x daily

## 2015-10-15 ENCOUNTER — Other Ambulatory Visit (INDEPENDENT_AMBULATORY_CARE_PROVIDER_SITE_OTHER): Payer: Medicare Other

## 2015-10-15 DIAGNOSIS — R110 Nausea: Secondary | ICD-10-CM

## 2015-10-15 DIAGNOSIS — R7309 Other abnormal glucose: Secondary | ICD-10-CM

## 2015-10-15 DIAGNOSIS — D508 Other iron deficiency anemias: Secondary | ICD-10-CM

## 2015-10-15 LAB — URINE CULTURE
Colony Count: NO GROWTH
ORGANISM ID, BACTERIA: NO GROWTH

## 2015-10-15 NOTE — Addendum Note (Signed)
Addended by: Johnella MoloneyFUNDERBURK, Sohan Potvin A on: 10/15/2015 12:18 PM   Modules accepted: Orders

## 2015-10-16 LAB — IBC PANEL
Iron: 23 ug/dL — ABNORMAL LOW (ref 42–145)
Saturation Ratios: 8.8 % — ABNORMAL LOW (ref 20.0–50.0)
Transferrin: 186 mg/dL — ABNORMAL LOW (ref 212.0–360.0)

## 2015-10-16 LAB — VITAMIN B12: VITAMIN B 12: 166 pg/mL — AB (ref 211–911)

## 2015-10-16 LAB — HEMOGLOBIN A1C: HEMOGLOBIN A1C: 5.7 % (ref 4.6–6.5)

## 2015-10-16 LAB — TSH: TSH: 2.25 u[IU]/mL (ref 0.35–4.50)

## 2015-10-20 ENCOUNTER — Ambulatory Visit (INDEPENDENT_AMBULATORY_CARE_PROVIDER_SITE_OTHER): Payer: Medicare Other | Admitting: Family Medicine

## 2015-10-20 ENCOUNTER — Encounter: Payer: Self-pay | Admitting: Family Medicine

## 2015-10-20 ENCOUNTER — Other Ambulatory Visit: Payer: Self-pay | Admitting: Family Medicine

## 2015-10-20 VITALS — BP 138/80 | HR 72 | Temp 98.4°F | Ht 64.0 in | Wt 168.2 lb

## 2015-10-20 DIAGNOSIS — A048 Other specified bacterial intestinal infections: Secondary | ICD-10-CM | POA: Insufficient documentation

## 2015-10-20 DIAGNOSIS — D649 Anemia, unspecified: Secondary | ICD-10-CM | POA: Diagnosis not present

## 2015-10-20 DIAGNOSIS — R262 Difficulty in walking, not elsewhere classified: Secondary | ICD-10-CM | POA: Diagnosis not present

## 2015-10-20 DIAGNOSIS — E538 Deficiency of other specified B group vitamins: Secondary | ICD-10-CM | POA: Diagnosis not present

## 2015-10-20 DIAGNOSIS — M25661 Stiffness of right knee, not elsewhere classified: Secondary | ICD-10-CM | POA: Diagnosis not present

## 2015-10-20 DIAGNOSIS — R112 Nausea with vomiting, unspecified: Secondary | ICD-10-CM | POA: Diagnosis not present

## 2015-10-20 DIAGNOSIS — Z96651 Presence of right artificial knee joint: Secondary | ICD-10-CM | POA: Diagnosis not present

## 2015-10-20 HISTORY — DX: Other specified bacterial intestinal infections: A04.8

## 2015-10-20 MED ORDER — AMOXICILLIN 500 MG PO TABS: 1000.0000 mg | ORAL_TABLET | Freq: Two times a day (BID) | ORAL | Status: DC

## 2015-10-20 MED ORDER — CLARITHROMYCIN 500 MG PO TABS: 500.0000 mg | ORAL_TABLET | Freq: Two times a day (BID) | ORAL | Status: DC

## 2015-10-20 MED ORDER — CYANOCOBALAMIN 1000 MCG/ML IJ SOLN
1000.0000 ug | Freq: Once | INTRAMUSCULAR | Status: AC
  Administered 2015-10-20: 1000 ug via INTRAMUSCULAR

## 2015-10-20 NOTE — Addendum Note (Signed)
Addended by: Johnella MoloneyFUNDERBURK, JO A on: 10/20/2015 02:33 PM   Modules accepted: Orders

## 2015-10-20 NOTE — Progress Notes (Signed)
HPI:  Follow up visit for nausea and anorexia: -started about 2 weeks ago -initially had some abd pain and vomiting, started low dose PPI last visit -labs showed iron def anemia and low b12 (she has long standning mild anemia and had surgery recently) -starting b12 injections and referred to GI for further evaluation -now reports: doing better since starting nexium, some mild nausea daily, but no vomiting and less anorexia, continues to feel tire, but improved a little -denies: Melena, hematochezia, changes in bowels, fevers, weight loss  ROS: See pertinent positives and negatives per HPI.  Past Medical History  Diagnosis Date  . Diverticulosis of colon   . Arthritis   . Hypertension   . Hyperlipidemia   . CVD (cerebrovascular disease)   . CAD (coronary artery disease)   . Osteoarthritis 03/07/2006    Qualifier: Diagnosis of  By: Cato MulliganSwords MD, Bruce      Past Surgical History  Procedure Laterality Date  . Abdominal hysterectomy  1984  . Replacement total knee  2007    left  . Total hip arthroplasty  2010    Dr. Turner Danielsowan, right  . Total knee arthroplasty Right 09/14/2015    Procedure: TOTAL KNEE ARTHROPLASTY;  Surgeon: Dannielle HuhSteve Lucey, MD;  Location: [facility] OR;  Service: Orthopedics;  Laterality: Right;    Family History  Problem Relation Age of Onset  . Diabetes Mother   . Hypertension Father   . Kidney disease Father   . Heart attack Sister   . Heart attack Brother   . Colon cancer Neg Hx   . Stomach cancer Neg Hx   . Esophageal cancer Neg Hx   . Rectal cancer Neg Hx     Social History   Social History  . Marital Status: Married    Spouse Name: N/A  . Number of Children: N/A  . Years of Education: N/A   Social History Main Topics  . Smoking status: Former Smoker    Quit date: 11/01/1980  . Smokeless tobacco: Never Used  . Alcohol Use: 0.0 oz/week    0 Standard drinks or equivalent per week     Comment: occasional glass wine  . Drug Use: No  . Sexual Activity: Not  Asked   Other Topics Concern  . 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      Home 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, Information systems managergrandaughter and two great grandchildren age 273 and 476      Spiritual Beliefs: Christian      Lifestyle: regular exercise at the [facility] [employer]; healthy diet           Current outpatient prescriptions:  .  enoxaparin (LOVENOX) 40 MG/0.4ML injection, Inject 0.4 mLs (40 mg total) into the skin daily., Disp: 13 Syringe, Rfl: 0 .  hydrochlorothiazide (HYDRODIURIL) 25 MG tablet, Take 1 tablet (25 mg total) by mouth daily., Disp: 90 tablet, Rfl: 3 .  losartan (COZAAR) 100 MG tablet, Take 1 tab daily (decreased from 1.5 tab=75 mg)., Disp: 30 tablet, Rfl: 0 .  metoprolol tartrate (LOPRESSOR) 25 MG tablet, Take 1 tablet (25 mg total) by mouth 2 (two) times daily., Disp: 180 tablet, Rfl: 3 .  Multiple Vitamin (MULTIVITAMIN WITH MINERALS) TABS, Take 1 tablet by mouth daily. , Disp: , Rfl:  .  simvastatin (ZOCOR) 40 MG tablet, Take 1 tablet (40 mg total) by mouth every evening. (Patient taking differently: Take 40 mg by mouth daily. ),  Disp: 90 tablet, Rfl: 3  EXAM:  Filed Vitals:   10/20/15 1340  BP: 138/80  Pulse: 72  Temp: 98.4 F (36.9 C)    Body mass index is 28.86 kg/(m^2).  GENERAL: vitals reviewed and listed above, alert, oriented, appears well hydrated and in no acute distress  HEENT: atraumatic, conjunttiva clear, no obvious abnormalities on inspection of external nose and ears  NECK: no obvious masses on inspection  LUNGS: clear to auscultation bilaterally, no wheezes, rales or rhonchi, good air movement  CV: HRRR, no peripheral edema  MS: moves all extremities without noticeable abnormality  PSYCH: pleasant and cooperative, no obvious depression or anxiety  ASSESSMENT AND PLAN:  Discussed the following assessment and plan:  Nausea and vomiting, intractability of vomiting not  specified, unspecified vomiting type  Anemia, unspecified anemia type  B12 deficiency  -She is doing a little better, still some nausea and fatigue -Chronic anemia, iron deficiency and B12 deficient, did have surgery not too long ago -b12 inj weekly for 1 month, then sl b12 daily -continue low dose PPI until seen by gastroenterologist -follow up in 3 months, will plan to recheck b12 then -Patient advised to return or notify a doctor immediately if symptoms worsen or persist or new concerns arise.  Patient Instructions  Before you leave: -Schedule follow-up in 3 months -B12 injection  B12 injections weekly for 1 month, then 1000 g sublingual B12 daily - you can purchase the B12 sublingual tablets over-the-counter at the pharmacy.  See the gastroenterologist as planned for the nausea and the iron deficiency anemia.       Felicia Basque R.

## 2015-10-20 NOTE — Patient Instructions (Signed)
Before you leave: -Schedule follow-up in 3 months -B12 injection  B12 injections weekly for 1 month, then 1000 g sublingual B12 daily - you can purchase the B12 sublingual tablets over-the-counter at the pharmacy.  See the gastroenterologist as planned for the nausea and the iron deficiency anemia.

## 2015-10-20 NOTE — Progress Notes (Signed)
Pre visit review using our clinic review tool, if applicable. No additional management support is needed unless otherwise documented below in the visit note. 

## 2015-10-20 NOTE — Progress Notes (Signed)
H. Pylori stool antigen test +. Discussed with pt and opted for triple therapy. Hold statin during tx. Has appt with gi and will keep in case any on going symptoms. She may cancel if feels great after tx and we will plan to recheck labs at follow up.

## 2015-10-26 ENCOUNTER — Ambulatory Visit: Payer: Medicare Other | Admitting: Family Medicine

## 2015-10-27 ENCOUNTER — Ambulatory Visit (INDEPENDENT_AMBULATORY_CARE_PROVIDER_SITE_OTHER): Payer: Medicare Other | Admitting: *Deleted

## 2015-10-27 DIAGNOSIS — E538 Deficiency of other specified B group vitamins: Secondary | ICD-10-CM | POA: Diagnosis not present

## 2015-10-27 MED ORDER — CYANOCOBALAMIN 1000 MCG/ML IJ SOLN
1000.0000 ug | Freq: Once | INTRAMUSCULAR | Status: AC
  Administered 2015-10-27: 1000 ug via INTRAMUSCULAR

## 2015-11-03 ENCOUNTER — Ambulatory Visit: Payer: Medicare Other | Admitting: *Deleted

## 2015-11-03 ENCOUNTER — Ambulatory Visit (INDEPENDENT_AMBULATORY_CARE_PROVIDER_SITE_OTHER): Payer: Medicare Other | Admitting: *Deleted

## 2015-11-03 DIAGNOSIS — E538 Deficiency of other specified B group vitamins: Secondary | ICD-10-CM

## 2015-11-03 MED ORDER — CYANOCOBALAMIN 1000 MCG/ML IJ SOLN
1000.0000 ug | Freq: Once | INTRAMUSCULAR | Status: AC
  Administered 2015-11-03: 1000 ug via INTRAMUSCULAR

## 2015-11-04 ENCOUNTER — Telehealth: Payer: Self-pay | Admitting: Family Medicine

## 2015-11-04 NOTE — Telephone Encounter (Signed)
Spoke to pt, told her according to Dr.Kim's last office note need to follow up in 3 months unless symptoms worsen. Also need to follow up with GI. Pt verbalized understanding and said she has an appt July 20th with GI. Told her okay, call if any new symptoms arise. Pt verbalized understanding.

## 2015-11-04 NOTE — Telephone Encounter (Signed)
Pt has completed amoxicillin and doing better and would like to know if she should follow up with dr Selena Battenkim

## 2015-11-06 DIAGNOSIS — M25661 Stiffness of right knee, not elsewhere classified: Secondary | ICD-10-CM | POA: Diagnosis not present

## 2015-11-06 DIAGNOSIS — Z96651 Presence of right artificial knee joint: Secondary | ICD-10-CM | POA: Diagnosis not present

## 2015-11-06 DIAGNOSIS — R262 Difficulty in walking, not elsewhere classified: Secondary | ICD-10-CM | POA: Diagnosis not present

## 2015-11-11 ENCOUNTER — Other Ambulatory Visit: Payer: Self-pay | Admitting: Family Medicine

## 2015-11-11 DIAGNOSIS — Z1231 Encounter for screening mammogram for malignant neoplasm of breast: Secondary | ICD-10-CM

## 2015-11-12 ENCOUNTER — Ambulatory Visit (INDEPENDENT_AMBULATORY_CARE_PROVIDER_SITE_OTHER): Payer: Medicare Other | Admitting: *Deleted

## 2015-11-12 DIAGNOSIS — E538 Deficiency of other specified B group vitamins: Secondary | ICD-10-CM | POA: Diagnosis not present

## 2015-11-12 MED ORDER — CYANOCOBALAMIN 1000 MCG/ML IJ SOLN
1000.0000 ug | Freq: Once | INTRAMUSCULAR | Status: AC
  Administered 2015-11-12: 1000 ug via INTRAMUSCULAR

## 2015-11-17 ENCOUNTER — Ambulatory Visit: Payer: Medicare Other

## 2015-11-18 ENCOUNTER — Ambulatory Visit: Payer: Medicare Other

## 2015-11-24 ENCOUNTER — Ambulatory Visit
Admission: RE | Admit: 2015-11-24 | Discharge: 2015-11-24 | Disposition: A | Discharge status: Home / Self Care | Payer: Medicare Other | Source: Ambulatory Visit | Attending: Family Medicine | Admitting: Family Medicine

## 2015-11-24 DIAGNOSIS — Z1231 Encounter for screening mammogram for malignant neoplasm of breast: Secondary | ICD-10-CM | POA: Diagnosis not present

## 2015-11-26 ENCOUNTER — Ambulatory Visit: Payer: Medicare Other | Admitting: Gastroenterology

## 2015-11-26 ENCOUNTER — Telehealth: Payer: Self-pay | Admitting: Gastroenterology

## 2015-11-26 NOTE — Telephone Encounter (Signed)
no

## 2015-11-26 NOTE — Telephone Encounter (Signed)
Late cancel. Appt has been moved from 11-26-2015 to 12-07-15.

## 2015-12-07 ENCOUNTER — Encounter: Payer: Self-pay | Admitting: Gastroenterology

## 2015-12-07 ENCOUNTER — Ambulatory Visit (INDEPENDENT_AMBULATORY_CARE_PROVIDER_SITE_OTHER): Payer: Medicare Other | Admitting: Gastroenterology

## 2015-12-07 VITALS — BP 122/80 | HR 80 | Ht 64.0 in | Wt 150.0 lb

## 2015-12-07 DIAGNOSIS — A048 Other specified bacterial intestinal infections: Secondary | ICD-10-CM

## 2015-12-07 DIAGNOSIS — B9681 Helicobacter pylori [H. pylori] as the cause of diseases classified elsewhere: Secondary | ICD-10-CM

## 2015-12-07 DIAGNOSIS — R112 Nausea with vomiting, unspecified: Secondary | ICD-10-CM

## 2015-12-07 NOTE — Patient Instructions (Signed)
If you are age 80 or older, your body mass index should be between 23-30. Your Body mass index is 25.75 kg/m. If this is out of the aforementioned range listed, please consider follow up with your Primary Care Provider.  If you are age 16 or younger, your body mass index should be between 19-25. Your Body mass index is 25.75 kg/m. If this is out of the aformentioned range listed, please consider follow up with your Primary Care Provider.   Your physician has requested that you go to the basement for the following lab work before leaving today: H.Pylori  Thank you for choosing [facility]  Dr Amada Jupiter III

## 2015-12-07 NOTE — Progress Notes (Signed)
[facility] Consult Note:  History: Felicia Cross 12/07/2015  Referring physician: Terressa Koyanagi., DO  Reason for consult/chief complaint: Nausea (has had lack of appetite and weight loss. )   Subjective  HPI:  Felicia Cross was referred by her primary care physician for recent nausea and anorexia. Shortly after knee surgery, while she was on bilateral, she had persistent nausea that lasted about 2 weeks. It did not seem to respond to Zofran, thus some lab work was done. It seems she had low iron saturation and B12, and B12 injections were started. The patient also reports that she had a stool test for H. pylori which was positive. I am unable to find any documentation of that in the upper chart. The patient was prescribed clarithromycin and amoxicillin, and says that about a week into the course, she had noticeable symptom improvement. At this point she feels just fine, with no nausea vomiting early satiety or weight loss. She had a routine colonoscopy with Dr. Arlyce Dice in March 2014, and only notable for sigmoid diverticulosis.  ROS:  Review of Systems  Constitutional: Negative for appetite change and unexpected weight change.  HENT: Negative for mouth sores and voice change.   Eyes: Negative for pain and redness.  Respiratory: Negative for cough and shortness of breath.   Cardiovascular: Negative for chest pain and palpitations.  Genitourinary: Negative for dysuria and hematuria.  Musculoskeletal: Negative for myalgias.       Right knee pain  Skin: Negative for pallor and rash.  Neurological: Negative for weakness and headaches.  Hematological: Negative for adenopathy.     Past Medical History: Past Medical History:  Diagnosis Date  . Arthritis   . CAD (coronary artery disease)   . CVD (cerebrovascular disease)   . Diverticulosis of colon   . Hyperlipidemia   . Hypertension   . Osteoarthritis 03/07/2006   Qualifier: Diagnosis of  By: Cato Mulligan MD, Bruce        Past Surgical History: Past Surgical History:  Procedure Laterality Date  . ABDOMINAL HYSTERECTOMY  1984  . REPLACEMENT TOTAL KNEE  2007   left  . TOTAL HIP ARTHROPLASTY  2010   Dr. Turner Daniels, right  . TOTAL KNEE ARTHROPLASTY Right 09/14/2015   Procedure: TOTAL KNEE ARTHROPLASTY;  Surgeon: Dannielle Huh, MD;  Location: [facility] OR;  Service: Orthopedics;  Laterality: Right;     Family History: Family History  Problem Relation Age of Onset  . Diabetes Mother   . Hypertension Father   . Kidney disease Father   . Heart attack Sister   . Heart attack Brother   . Colon cancer Neg Hx   . Stomach cancer Neg Hx   . Esophageal cancer Neg Hx   . Rectal cancer Neg Hx     Social History: Social History   Social History  . Marital status: Married    Spouse name: N/A  . Number of children: N/A  . Years of education: N/A   Social History Main Topics  . Smoking status: Former Smoker    Quit date: 11/01/1980  . Smokeless tobacco: Never Used  . Alcohol use 0.0 oz/week     Comment: occasional glass wine  . Drug use: No  . Sexual activity: Not Asked   Other Topics Concern  . None   Social History Narrative   Updated 01/07/15   Work or School: retired from Geneticist, molecular, volunteers at [employer]. [place]      Home 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, Information systems manager and two great  grandchildren age 98 and 51      Spiritual Beliefs: Christian      Lifestyle: regular exercise at the [facility]; healthy diet          Allergies: Allergies  Allergen Reactions  . Codeine Nausea And Vomiting  . Hydrocodone Nausea And Vomiting    Outpatient Meds: Current Outpatient Prescriptions  Medication Sig Dispense Refill  . clarithromycin (BIAXIN) 500 MG tablet Take 1 tablet (500 mg total) by mouth 2 (two) times daily. 28 tablet 0  . hydrochlorothiazide (HYDRODIURIL) 25 MG tablet Take 1 tablet (25 mg total) by mouth daily. 90 tablet 3  . losartan (COZAAR) 100 MG tablet Take 1 tab daily (decreased from 1.5 tab=75  mg). 30 tablet 0  . metoprolol tartrate (LOPRESSOR) 25 MG tablet Take 1 tablet (25 mg total) by mouth 2 (two) times daily. 180 tablet 3  . Multiple Vitamin (MULTIVITAMIN WITH MINERALS) TABS Take 1 tablet by mouth daily.     . simvastatin (ZOCOR) 40 MG tablet Take 1 tablet (40 mg total) by mouth every evening. (Patient taking differently: Take 40 mg by mouth daily. ) 90 tablet 3   No current facility-administered medications for this visit.       ___________________________________________________________________ Objective   Exam:  BP 122/80   Pulse 80   Ht  (1.626 m)   Wt 150 lb (68 kg)   BMI 25.75 kg/m    General: this is a(n) Well-appearing elderly woman, looks younger than stated age.   Eyes: sclera anicteric, no redness  ENT: oral mucosa moist without lesions, no cervical or supraclavicular lymphadenopathy, good dentition  CV: RRR without murmur, S1/S2, no JVD, no peripheral edema  Resp: clear to auscultation bilaterally, normal RR and effort noted  GI: soft, no tenderness, with active bowel sounds. No guarding or palpable organomegaly noted.  Skin; warm and dry, no rash or jaundice noted  Neuro: awake, alert and oriented x 3. Normal gross motor function and fluent speech  Labs: B-12 =  167 CBC    Component Value Date/Time   WBC 7.4 10/13/2015 1333   RBC 3.98 10/13/2015 1333   HGB 11.4 (L) 10/13/2015 1333   HCT 35.7 (L) 10/13/2015 1333   PLT 352.0 10/13/2015 1333   MCV 89.8 10/13/2015 1333   MCH 29.8 09/15/2015 0600   MCHC 32.0 10/13/2015 1333   RDW 14.5 10/13/2015 1333   LYMPHSABS 1.8 10/13/2015 1333   MONOABS 0.8 10/13/2015 1333   EOSABS 0.4 10/13/2015 1333   BASOSABS 0.1 10/13/2015 1333    Assessment: Encounter Diagnoses  Name Primary?  . Non-intractable vomiting with nausea, vomiting of unspecified type Yes  . H. pylori infection     It is not entirely clear if the H. pylori was the source of symptoms, but she is certainly improved after  treatment.  Plan:  Stool H. pylori antigen to confirm eradication. If negative, no further testing. If positive, schedule upper endoscopy to see if there is any visible abnormality to account for the symptoms and whether or not H. pylori needs to be retreated.  Thank you for the courtesy of this consult.  Please call me with any questions or concerns.  Charlie Pitter III  CC: Terressa Koyanagi., DO

## 2015-12-08 ENCOUNTER — Other Ambulatory Visit: Payer: Medicare Other

## 2015-12-08 DIAGNOSIS — B9681 Helicobacter pylori [H. pylori] as the cause of diseases classified elsewhere: Secondary | ICD-10-CM | POA: Diagnosis not present

## 2015-12-08 DIAGNOSIS — A048 Other specified bacterial intestinal infections: Secondary | ICD-10-CM

## 2015-12-08 DIAGNOSIS — R112 Nausea with vomiting, unspecified: Secondary | ICD-10-CM | POA: Diagnosis not present

## 2015-12-10 LAB — H. PYLORI ANTIGEN, STOOL: H pylori Ag, Stl: NEGATIVE

## 2016-01-06 DIAGNOSIS — Z01411 Encounter for gynecological examination (general) (routine) with abnormal findings: Secondary | ICD-10-CM | POA: Diagnosis not present

## 2016-01-18 ENCOUNTER — Encounter: Payer: Self-pay | Admitting: Family Medicine

## 2016-01-18 ENCOUNTER — Ambulatory Visit (INDEPENDENT_AMBULATORY_CARE_PROVIDER_SITE_OTHER): Payer: Medicare Other | Admitting: Family Medicine

## 2016-01-18 VITALS — BP 138/88 | HR 76 | Temp 97.5°F | Ht 64.0 in | Wt 164.5 lb

## 2016-01-18 DIAGNOSIS — D649 Anemia, unspecified: Secondary | ICD-10-CM | POA: Insufficient documentation

## 2016-01-18 DIAGNOSIS — I779 Disorder of arteries and arterioles, unspecified: Secondary | ICD-10-CM

## 2016-01-18 DIAGNOSIS — I739 Peripheral vascular disease, unspecified: Secondary | ICD-10-CM

## 2016-01-18 DIAGNOSIS — I10 Essential (primary) hypertension: Secondary | ICD-10-CM

## 2016-01-18 DIAGNOSIS — E538 Deficiency of other specified B group vitamins: Secondary | ICD-10-CM | POA: Diagnosis not present

## 2016-01-18 DIAGNOSIS — Z23 Encounter for immunization: Secondary | ICD-10-CM

## 2016-01-18 DIAGNOSIS — E785 Hyperlipidemia, unspecified: Secondary | ICD-10-CM | POA: Diagnosis not present

## 2016-01-18 HISTORY — DX: Deficiency of other specified B group vitamins: E53.8

## 2016-01-18 HISTORY — DX: Anemia, unspecified: D64.9

## 2016-01-18 LAB — CBC WITH DIFFERENTIAL/PLATELET
BASOS ABS: 0 10*3/uL (ref 0.0–0.1)
Basophils Relative: 0.9 % (ref 0.0–3.0)
EOS ABS: 0.1 10*3/uL (ref 0.0–0.7)
Eosinophils Relative: 1.4 % (ref 0.0–5.0)
HEMATOCRIT: 38.3 % (ref 36.0–46.0)
HEMOGLOBIN: 12.6 g/dL (ref 12.0–15.0)
LYMPHS PCT: 38.7 % (ref 12.0–46.0)
Lymphs Abs: 2.2 10*3/uL (ref 0.7–4.0)
MCHC: 32.9 g/dL (ref 30.0–36.0)
MCV: 88.9 fl (ref 78.0–100.0)
MONOS PCT: 7.8 % (ref 3.0–12.0)
Monocytes Absolute: 0.4 10*3/uL (ref 0.1–1.0)
NEUTROS ABS: 2.9 10*3/uL (ref 1.4–7.7)
Neutrophils Relative %: 51.2 % (ref 43.0–77.0)
Platelets: 316 10*3/uL (ref 150.0–400.0)
RBC: 4.3 Mil/uL (ref 3.87–5.11)
RDW: 15.9 % — ABNORMAL HIGH (ref 11.5–15.5)
WBC: 5.7 10*3/uL (ref 4.0–10.5)

## 2016-01-18 LAB — VITAMIN B12: Vitamin B-12: 511 pg/mL (ref 211–911)

## 2016-01-18 NOTE — Progress Notes (Signed)
Pre visit review using our clinic review tool, if applicable. No additional management support is needed unless otherwise documented below in the visit note. 

## 2016-01-18 NOTE — Progress Notes (Addendum)
HPI:  Follow up:  Nausea/Anorexia: -+anemia -h. Pylori stool antigen +, treated with clarithromycin and amox and referred to GI; repeat lab showed eradication and symptoms resolved -saw GI -reports: doing "great!" "so much better"; hx chronic anemia per her report -denies: abd pain, nausea, anorexia, vomiting, weight loss, melena, hematochezia  HTN: -meds: hctz 25mg , losartan 75mg , metoprolol 25 bid -denies: CP, SOB, DOe, HA, vision changes  Hx minimal CAD and carotid art dz (sees cardiologist), HLD: -meds: asa,statin, BB, arb  ROS: See pertinent positives and negatives per HPI.  Past Medical History:  Diagnosis Date  . Anemia 01/18/2016   -reports on and off her whole life, on oral iron in the past remotely per report  . Arthritis   . B12 deficiency 01/18/2016  . CAD (coronary artery disease)   . CVD (cerebrovascular disease)   . Diverticulosis of colon   . H. pylori infection 10/20/2015  . Hyperlipidemia   . Hypertension   . Osteoarthritis 03/07/2006   Qualifier: Diagnosis of  By: Cato Mulligan MD, Bruce      Past Surgical History:  Procedure Laterality Date  . ABDOMINAL HYSTERECTOMY  1984  . REPLACEMENT TOTAL KNEE  2007   left  . TOTAL HIP ARTHROPLASTY  2010   Dr. Turner Daniels, right  . TOTAL KNEE ARTHROPLASTY Right 09/14/2015   Procedure: TOTAL KNEE ARTHROPLASTY;  Surgeon: Dannielle Huh, MD;  Location: [facility] OR;  Service: Orthopedics;  Laterality: Right;    Family History  Problem Relation Age of Onset  . Diabetes Mother   . Hypertension Father   . Kidney disease Father   . Heart attack Sister   . Heart attack Brother   . Colon cancer Neg Hx   . Stomach cancer Neg Hx   . Esophageal cancer Neg Hx   . Rectal cancer Neg Hx     Social History   Social History  . Marital status: Married    Spouse name: N/A  . Number of children: N/A  . Years of education: N/A   Social History Main Topics  . Smoking status: Former Smoker    Quit date: 11/01/1980  . Smokeless tobacco:  Never Used  . Alcohol use 0.0 oz/week     Comment: occasional glass wine  . Drug use: No  . Sexual activity: Not Asked   Other Topics Concern  . None   Social History Narrative   Updated 01/07/15   Work or School: retired from Geneticist, molecular, volunteers at [employer]. [place]      Home 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, Information systems manager and two great grandchildren age 53 and 70      Spiritual Beliefs: Christian      Lifestyle: regular exercise at the [facility]; healthy diet           Current Outpatient Prescriptions:  .  hydrochlorothiazide (HYDRODIURIL) 25 MG tablet, Take 1 tablet (25 mg total) by mouth daily., Disp: 90 tablet, Rfl: 3 .  losartan (COZAAR) 100 MG tablet, Take 1 tab daily (decreased from 1.5 tab=75 mg)., Disp: 30 tablet, Rfl: 0 .  metoprolol tartrate (LOPRESSOR) 25 MG tablet, Take 1 tablet (25 mg total) by mouth 2 (two) times daily., Disp: 180 tablet, Rfl: 3 .  Multiple Vitamin (MULTIVITAMIN WITH MINERALS) TABS, Take 1 tablet by mouth daily. , Disp: , Rfl:  .  simvastatin (ZOCOR) 40 MG tablet, Take 1 tablet (40 mg total) by mouth every evening. (Patient taking differently: Take 40 mg by mouth daily. ), Disp: 90 tablet, Rfl: 3  EXAM:  Vitals:   01/18/16 1054  BP: 138/88  Pulse: 76  Temp: 97.5 F (36.4 C)    Body mass index is 28.24 kg/m.  GENERAL: vitals reviewed and listed above, alert, oriented, appears well hydrated and in no acute distress  HEENT: atraumatic, conjunttiva clear, no obvious abnormalities on inspection of external nose and ears  NECK: no obvious masses on inspection  LUNGS: clear to auscultation bilaterally, no wheezes, rales or rhonchi, good air movement  CV: HRRR, no peripheral edema  MS: moves all extremities without noticeable abnormality  PSYCH: pleasant and cooperative, no obvious depression or anxiety  ASSESSMENT AND PLAN:  Discussed the following assessment and plan:  Anemia, unspecified anemia type - Plan: CBC with  Differential/Platelets  B12 deficiency - Plan: Vitamin B12  Essential hypertension  Hyperlipemia  Carotid artery disease, unspecified laterality (HCC)  -check b12 and cbc -lifestyle recs -flu shot offered -Patient advised to return or notify a doctor immediately if symptoms worsen or persist or new concerns arise.  Patient Instructions  BEFORE YOU LEAVE: -follow up: Medicare with Susa and follow up with Dr. Selena BattenKim in 3-4 months -flu shot  We have ordered labs or studies at this visit. It can take up to 1-2 weeks for results and processing. IF results require follow up or explanation, we will call you with instructions. Clinically stable results will be released to your [facility]. If you have not heard from us or cannot find your results in [facility] in 2 weeks please contact our office at [phone].  If you are not yet signed up for [facility], please consider signing up.   We recommend the following healthy lifestyle for LIFE: 1) Small portions.   Tip: eat off of a salad plate instead of a dinner plate.  Tip: It is ok to feel hungry after a meal - that likely means you ate an appropriate portion.  Tip: if you need more or a snack choose fruits, veggies and/or a handful of nuts or seeds.  2) Eat a healthy clean diet.  * Tip: Avoid (less then 1 serving per week): processed foods, sweets, sweetened drinks, white starches (rice, flour, bread, potatoes, pasta, etc), red meat, fast foods, butter  *Tip: CHOOSE instead   * 5-9 servings per day of fresh or frozen fruits and vegetables (but not corn, potatoes, bananas, canned or dried fruit)   *nuts and seeds, beans   *olives and olive oil   *small portions of lean meats such as fish and white chicken    *small portions of whole grains  3)Get at least 150 minutes of sweaty aerobic exercise per week.  4)Reduce stress - consider counseling, meditation and relaxation to balance other aspects of your life.            Kriste BasqueKIM, Aryianna Earwood R.,  DO

## 2016-01-18 NOTE — Patient Instructions (Addendum)
BEFORE YOU LEAVE: -follow up: Medicare with Susa and follow up with Dr. Selena BattenKim in 3-4 months -flu shot  We have ordered labs or studies at this visit. It can take up to 1-2 weeks for results and processing. IF results require follow up or explanation, we will call you with instructions. Clinically stable results will be released to your [facility]. If you have not heard from us or cannot find your results in [facility] in 2 weeks please contact our office at [phone].  If you are not yet signed up for [facility]CHART, please consider signing up.   We recommend the following healthy lifestyle for LIFE: 1) Small portions.   Tip: eat off of a salad plate instead of a dinner plate.  Tip: It is ok to feel hungry after a meal - that likely means you ate an appropriate portion.  Tip: if you need more or a snack choose fruits, veggies and/or a handful of nuts or seeds.  2) Eat a healthy clean diet.  * Tip: Avoid (less then 1 serving per week): processed foods, sweets, sweetened drinks, white starches (rice, flour, bread, potatoes, pasta, etc), red meat, fast foods, butter  *Tip: CHOOSE instead   * 5-9 servings per day of fresh or frozen fruits and vegetables (but not corn, potatoes, bananas, canned or dried fruit)   *nuts and seeds, beans   *olives and olive oil   *small portions of lean meats such as fish and white chicken    *small portions of whole grains  3)Get at least 150 minutes of sweaty aerobic exercise per week.  4)Reduce stress - consider counseling, meditation and relaxation to balance other aspects of your life.

## 2016-04-05 DIAGNOSIS — Z471 Aftercare following joint replacement surgery: Secondary | ICD-10-CM | POA: Diagnosis not present

## 2016-04-05 DIAGNOSIS — Z96651 Presence of right artificial knee joint: Secondary | ICD-10-CM | POA: Diagnosis not present

## 2016-04-17 NOTE — Progress Notes (Signed)
HPI:  Felicia Cross is a pleasant 82 yo with a PMH HTN, CAD, B12 def, chronic anemia, Carotid art. Dz, HLD here for follow up.  Sees Dr. Jens Somrenshaw, cardiologist for her CV disease. Reports she is doing well and is without complaints. BP a little high on arrival and she thinks is due to stress from traffic with taking grandkids to school before appointment. No regular exercise. Feels diet is healthy. Denies CP, SOb, DOE, palpitations, swelling.  ROS: See pertinent positives and negatives per HPI.  Past Medical History:  Diagnosis Date  . Anemia 01/18/2016   -reports on and off her whole life, on oral iron in the past remotely per report  . Arthritis   . B12 deficiency 01/18/2016  . CAD (coronary artery disease)   . CVD (cerebrovascular disease)   . Diverticulosis of colon   . H. pylori infection 10/20/2015  . Hyperlipidemia   . Hypertension   . Osteoarthritis 03/07/2006   Qualifier: Diagnosis of  By: Cato MulliganSwords MD, Bruce      Past Surgical History:  Procedure Laterality Date  . ABDOMINAL HYSTERECTOMY  1984  . REPLACEMENT TOTAL KNEE  2007   left  . TOTAL HIP ARTHROPLASTY  2010   Dr. Turner Danielsowan, right  . TOTAL KNEE ARTHROPLASTY Right 09/14/2015   Procedure: TOTAL KNEE ARTHROPLASTY;  Surgeon: Dannielle HuhSteve Lucey, MD;  Location: [facility] OR;  Service: Orthopedics;  Laterality: Right;    Family History  Problem Relation Age of Onset  . Diabetes Mother   . Hypertension Father   . Kidney disease Father   . Heart attack Sister   . Heart attack Brother   . Colon cancer Neg Hx   . Stomach cancer Neg Hx   . Esophageal cancer Neg Hx   . Rectal cancer Neg Hx     Social History   Social History  . Marital status: Married    Spouse name: N/A  . Number of children: N/A  . Years of education: N/A   Social History Main Topics  . Smoking status: Former Smoker    Quit date: 11/01/1980  . Smokeless tobacco: Never Used  . Alcohol use 0.0 oz/week     Comment: occasional glass wine  . Drug use: No  .  Sexual activity: Not Asked   Other Topics Concern  . 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      Home 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, Information systems managergrandaughter and two great grandchildren age 243 and 236      Spiritual Beliefs: Christian      Lifestyle: [facility]; healthy diet           Current Outpatient Prescriptions:  .  hydrochlorothiazide (HYDRODIURIL) 25 MG tablet, Take 1 tablet (25 mg total) by mouth daily., Disp: 90 tablet, Rfl: 3 .  losartan (COZAAR) 100 MG tablet, Take 1 tab daily (decreased from 1.5 tab=75 mg)., Disp: 30 tablet, Rfl: 0 .  metoprolol tartrate (LOPRESSOR) 25 MG tablet, Take 1 tablet (25 mg total) by mouth 2 (two) times daily., Disp: 180 tablet, Rfl: 3 .  Multiple Vitamin (MULTIVITAMIN WITH MINERALS) TABS, Take 1 tablet by mouth daily. , Disp: , Rfl:  .  simvastatin (ZOCOR) 40 MG tablet, Take 1 tablet (40 mg total) by mouth every evening. (Patient taking differently: Take 40 mg by mouth daily. ), Disp: 90 tablet, Rfl: 3  EXAM:  Vitals:  04/18/16 0855  BP: 132/90  Pulse: 66  Temp: 98.3 F (36.8 C)    Body mass index is 29.49 kg/m.  GENERAL: vitals reviewed and listed above, alert, oriented, appears well hydrated and in no acute distress  HEENT: atraumatic, conjunttiva clear, no obvious abnormalities on inspection of external nose and ears  NECK: no obvious masses on inspection  LUNGS: clear to auscultation bilaterally, no wheezes, rales or rhonchi, good air movement  CV: HRRR, no peripheral edema  MS: moves all extremities without noticeable abnormality  PSYCH: pleasant and cooperative, no obvious depression or anxiety  ASSESSMENT AND PLAN:  Discussed the following assessment and plan:  Essential hypertension - Plan: Basic metabolic panel, CBC  B12 deficiency - Plan: Vitamin B12  Anemia, unspecified type  Carotid artery disease, unspecified  laterality (HCC)  Atherosclerosis of native coronary artery of native heart without angina pectoris  Hyperlipidemia, unspecified hyperlipidemia type - Plan: Lipid panel  -labs today -discussed BP and options, she prefers to monitor and try to increase exercise as feels up due to traffic stress today -she is due for Medicare exam - will plan to have Darl PikesSusan recheck her BP then -lifestyle recs -Patient advised to return or notify a doctor immediately if symptoms worsen or persist or new concerns arise.  Patient Instructions  BEFORE YOU LEAVE: -follow up: 1) Medicare wellness with Darl PikesSusan in next 1-2 months 2) follow up with Dr. Selena BattenKim in 4 months -labs  We have ordered labs or studies at this visit. It can take up to 1-2 weeks for results and processing. IF results require follow up or explanation, we will call you with instructions. Clinically stable results will be released to your [facility]. If you have not heard from us or cannot find your results in [facility] in 2 weeks please contact our office at [phone].  If you are not yet signed up for [facility], please consider signing up.   We recommend the following healthy lifestyle for LIFE: 1) Small portions.   Tip: eat off of a salad plate instead of a dinner plate.  Tip: if you need more or a snack choose fruits, veggies and/or a handful of nuts or seeds.  2) Eat a healthy clean diet.  * Tip: Avoid (less then 1 serving per week): processed foods, sweets, sweetened drinks, white starches (rice, flour, bread, potatoes, pasta, etc), red meat, fast foods, butter  *Tip: CHOOSE instead   * 5-9 servings per day of fresh or frozen fruits and vegetables (but not corn, potatoes, bananas, canned or dried fruit)   *nuts and seeds, beans   *olives and olive oil   *small portions of lean meats such as fish and white chicken    *small portions of whole grains  3)Get at least 150 minutes of sweaty aerobic exercise per week.  4)Reduce stress - consider  counseling, meditation and relaxation to balance other aspects of your life.          Kriste BasqueKIM, Shimon Trowbridge R., DO

## 2016-04-18 ENCOUNTER — Encounter: Payer: Self-pay | Admitting: Family Medicine

## 2016-04-18 ENCOUNTER — Ambulatory Visit (INDEPENDENT_AMBULATORY_CARE_PROVIDER_SITE_OTHER): Payer: Medicare Other | Admitting: Family Medicine

## 2016-04-18 VITALS — BP 132/90 | HR 66 | Temp 98.3°F | Ht 64.0 in | Wt 171.8 lb

## 2016-04-18 DIAGNOSIS — I10 Essential (primary) hypertension: Secondary | ICD-10-CM

## 2016-04-18 DIAGNOSIS — I779 Disorder of arteries and arterioles, unspecified: Secondary | ICD-10-CM | POA: Diagnosis not present

## 2016-04-18 DIAGNOSIS — D649 Anemia, unspecified: Secondary | ICD-10-CM

## 2016-04-18 DIAGNOSIS — I739 Peripheral vascular disease, unspecified: Secondary | ICD-10-CM

## 2016-04-18 DIAGNOSIS — E785 Hyperlipidemia, unspecified: Secondary | ICD-10-CM

## 2016-04-18 DIAGNOSIS — E538 Deficiency of other specified B group vitamins: Secondary | ICD-10-CM | POA: Diagnosis not present

## 2016-04-18 DIAGNOSIS — I2510 Atherosclerotic heart disease of native coronary artery without angina pectoris: Secondary | ICD-10-CM

## 2016-04-18 LAB — CBC
HCT: 37.8 % (ref 36.0–46.0)
HEMOGLOBIN: 12.4 g/dL (ref 12.0–15.0)
MCHC: 32.7 g/dL (ref 30.0–36.0)
MCV: 89.7 fl (ref 78.0–100.0)
PLATELETS: 315 10*3/uL (ref 150.0–400.0)
RBC: 4.21 Mil/uL (ref 3.87–5.11)
RDW: 16.5 % — ABNORMAL HIGH (ref 11.5–15.5)
WBC: 6.2 10*3/uL (ref 4.0–10.5)

## 2016-04-18 LAB — BASIC METABOLIC PANEL
BUN: 19 mg/dL (ref 6–23)
CALCIUM: 9.7 mg/dL (ref 8.4–10.5)
CO2: 29 mEq/L (ref 19–32)
Chloride: 100 mEq/L (ref 96–112)
Creatinine, Ser: 1.03 mg/dL (ref 0.40–1.20)
GFR: 66.12 mL/min (ref >60.00)
Glucose, Bld: 76 mg/dL (ref 70–99)
Potassium: 4.3 mEq/L (ref 3.5–5.1)
SODIUM: 138 meq/L (ref 135–145)

## 2016-04-18 LAB — LIPID PANEL
CHOLESTEROL: 228 mg/dL — AB (ref 0–200)
HDL: 63.7 mg/dL (ref >39.00)
LDL Cholesterol: 142 mg/dL — ABNORMAL HIGH (ref 0–99)
NonHDL: 164.12
TRIGLYCERIDES: 110 mg/dL (ref 0.0–149.0)
Total CHOL/HDL Ratio: 4
VLDL: 22 mg/dL (ref 0.0–40.0)

## 2016-04-18 LAB — VITAMIN B12: Vitamin B-12: 261 pg/mL (ref 211–911)

## 2016-04-18 NOTE — Patient Instructions (Signed)
BEFORE YOU LEAVE: -follow up: 1) Medicare wellness with Darl PikesSusan in next 1-2 months 2) follow up with Dr. Selena BattenKim in 4 months -labs  We have ordered labs or studies at this visit. It can take up to 1-2 weeks for results and processing. IF results require follow up or explanation, we will call you with instructions. Clinically stable results will be released to your [facility]. If you have not heard from us or cannot find your results in [facility] in 2 weeks please contact our office at [phone].  If you are not yet signed up for [facility], please consider signing up.   We recommend the following healthy lifestyle for LIFE: 1) Small portions.   Tip: eat off of a salad plate instead of a dinner plate.  Tip: if you need more or a snack choose fruits, veggies and/or a handful of nuts or seeds.  2) Eat a healthy clean diet.  * Tip: Avoid (less then 1 serving per week): processed foods, sweets, sweetened drinks, white starches (rice, flour, bread, potatoes, pasta, etc), red meat, fast foods, butter  *Tip: CHOOSE instead   * 5-9 servings per day of fresh or frozen fruits and vegetables (but not corn, potatoes, bananas, canned or dried fruit)   *nuts and seeds, beans   *olives and olive oil   *small portions of lean meats such as fish and white chicken    *small portions of whole grains  3)Get at least 150 minutes of sweaty aerobic exercise per week.  4)Reduce stress - consider counseling, meditation and relaxation to balance other aspects of your life.

## 2016-04-18 NOTE — Progress Notes (Signed)
Pre visit review using our clinic review tool, if applicable. No additional management support is needed unless otherwise documented below in the visit note. 

## 2016-04-21 ENCOUNTER — Telehealth: Payer: Self-pay | Admitting: Family Medicine

## 2016-04-21 NOTE — Telephone Encounter (Signed)
See results note. 

## 2016-04-21 NOTE — Telephone Encounter (Signed)
Felicia Cross pt returned your call °

## 2016-05-19 ENCOUNTER — Ambulatory Visit: Payer: Medicare Other

## 2016-05-27 ENCOUNTER — Ambulatory Visit: Payer: Medicare Other

## 2016-06-03 ENCOUNTER — Ambulatory Visit (INDEPENDENT_AMBULATORY_CARE_PROVIDER_SITE_OTHER): Payer: Medicare Other

## 2016-06-03 VITALS — BP 130/90 | HR 64 | Ht 64.0 in | Wt 171.0 lb

## 2016-06-03 DIAGNOSIS — Z0000 Encounter for general adult medical examination without abnormal findings: Secondary | ICD-10-CM | POA: Diagnosis not present

## 2016-06-03 NOTE — Patient Instructions (Addendum)
Ms. Bryner , Thank you for taking time to come for your Medicare Wellness Visit. I appreciate your ongoing commitment to your health goals. Please review the following plan we discussed and let me know if I can assist you in the future.   You can go to the osteoporosis [email or web address] to learn more and decide about the dexa     These are the goals we discussed: Goals    . Exercise 150 minutes per week (moderate activity)          May check on Silver sneaker class May try to learn to swim;        This is a list of the screening recommended for you and due dates:  Health Maintenance  Topic Date Due  . Tetanus Vaccine  05/09/2018  . Flu Shot  Completed  . DEXA scan (bone density measurement)  Addressed  . Shingles Vaccine  Completed  . Pneumonia vaccines  Completed   Prevention of falls: Remove rugs or any tripping hazards in the home Use Non slip mats in bathtubs and showers Placing grab bars next to the toilet and or shower Placing handrails on both sides of the stair way Adding extra lighting in the home.   Personal safety issues reviewed:  1. Consider starting a community watch program per [facility] 2.  Changes batteries is smoke detector and/or carbon monoxide detector  3.  If you have firearms; keep them in a safe place 4.  Wear protection when in the sun; Always wear sunscreen or a hat; It is good to have your doctor check your skin annually or review any new areas of concern 5. Driving safety; Keep in the right lane; stay 3 car lengths behind the car in front of you on the highway; look 3 times prior to pulling out; carry your cell phone everywhere you go!    Learn about the Yellow Dot program:  The program allows first responders at your emergency to have access to who your physician is, as well as your medications and medical conditions.  Citizens requesting the Yellow Dot Packages should contact Master Corporal Nunzio Cobbs at the [facility] [phone] for the first week of the program and beginning the week after Easter citizens should contact their Scientist, physiological.       Bone Densitometry Introduction Bone densitometry is an imaging test that uses a special X-ray to measure the amount of calcium and other minerals in your bones (bone density). This test is also known as a bone mineral density test or dual-energy X-ray absorptiometry (DXA). The test can measure bone density at your hip and your spine. It is similar to having a regular X-ray. You may have this test to:  Diagnose a condition that causes weak or thin bones (osteoporosis).  Predict your risk of a broken bone (fracture).  Determine how well osteoporosis treatment is working. Tell a health care provider about:  Any allergies you have.  All medicines you are taking, including vitamins, herbs, eye drops, creams, and over-the-counter medicines.  Any problems you or family members have had with anesthetic medicines.  Any blood disorders you have.  Any surgeries you have had.  Any medical conditions you have.  Possibility of pregnancy.  Any other medical test you had within the previous 14 days that used contrast material. What are the risks? Generally, this is a safe procedure. However, problems can occur and may include the following:  This test exposes you  to a very small amount of radiation.  The risks of radiation exposure may be greater to unborn children. What happens before the procedure?  Do not take any calcium supplements for 24 hours before having the test. You can otherwise eat and drink what you usually do.  Take off all metal jewelry, eyeglasses, dental appliances, and any other metal objects. What happens during the procedure?  You may lie on an exam table. There will be an X-ray generator below you and an imaging device above you.  Other devices, such as boxes or braces, may be used to position your  body properly for the scan.  You will need to lie still while the machine slowly scans your body.  The images will show up on a computer monitor. What happens after the procedure? You may need more testing at a later time. This information is not intended to replace advice given to you by your health care provider. Make sure you discuss any questions you have with your health care provider. Document Released: 05/17/2004 Document Revised: 10/01/2015 Document Reviewed: 10/03/2013  2017 Elsevier   Fall Prevention in the Home Introduction Falls can cause injuries. They can happen to people of all ages. There are many things you can do to make your home safe and to help prevent falls. What can I do on the outside of my home?  Regularly fix the edges of walkways and driveways and fix any cracks.  Remove anything that might make you trip as you walk through a door, such as a raised step or threshold.  Trim any bushes or trees on the path to your home.  Use bright outdoor lighting.  Clear any walking paths of anything that might make someone trip, such as rocks or tools.  Regularly check to see if handrails are loose or broken. Make sure that both sides of any steps have handrails.  Any raised decks and porches should have guardrails on the edges.  Have any leaves, snow, or ice cleared regularly.  Use sand or salt on walking paths during winter.  Clean up any spills in your garage right away. This includes oil or grease spills. What can I do in the bathroom?  Use night lights.  Install grab bars by the toilet and in the tub and shower. Do not use towel bars as grab bars.  Use non-skid mats or decals in the tub or shower.  If you need to sit down in the shower, use a plastic, non-slip stool.  Keep the floor dry. Clean up any water that spills on the floor as soon as it happens.  Remove soap buildup in the tub or shower regularly.  Attach bath mats securely with double-sided  non-slip rug tape.  Do not have throw rugs and other things on the floor that can make you trip. What can I do in the bedroom?  Use night lights.  Make sure that you have a light by your bed that is easy to reach.  Do not use any sheets or blankets that are too big for your bed. They should not hang down onto the floor.  Have a firm chair that has side arms. You can use this for support while you get dressed.  Do not have throw rugs and other things on the floor that can make you trip. What can I do in the kitchen?  Clean up any spills right away.  Avoid walking on wet floors.  Keep items that you use a lot  in easy-to-reach places.  If you need to reach something above you, use a strong step stool that has a grab bar.  Keep electrical cords out of the way.  Do not use floor polish or wax that makes floors slippery. If you must use wax, use non-skid floor wax.  Do not have throw rugs and other things on the floor that can make you trip. What can I do with my stairs?  Do not leave any items on the stairs.  Make sure that there are handrails on both sides of the stairs and use them. Fix handrails that are broken or loose. Make sure that handrails are as long as the stairways.  Check any carpeting to make sure that it is firmly attached to the stairs. Fix any carpet that is loose or worn.  Avoid having throw rugs at the top or bottom of the stairs. If you do have throw rugs, attach them to the floor with carpet tape.  Make sure that you have a light switch at the top of the stairs and the bottom of the stairs. If you do not have them, ask someone to add them for you. What else can I do to help prevent falls?  Wear shoes that:  Do not have high heels.  Have rubber bottoms.  Are comfortable and fit you well.  Are closed at the toe. Do not wear sandals.  If you use a stepladder:  Make sure that it is fully opened. Do not climb a closed stepladder.  Make sure that both  sides of the stepladder are locked into place.  Ask someone to hold it for you, if possible.  Clearly mark and make sure that you can see:  Any grab bars or handrails.  First and last steps.  Where the edge of each step is.  Use tools that help you move around (mobility aids) if they are needed. These include:  Canes.  Walkers.  Scooters.  Crutches.  Turn on the lights when you go into a dark area. Replace any light bulbs as soon as they burn out.  Set up your furniture so you have a clear path. Avoid moving your furniture around.  If any of your floors are uneven, fix them.  If there are any pets around you, be aware of where they are.  Review your medicines with your doctor. Some medicines can make you feel dizzy. This can increase your chance of falling. Ask your doctor what other things that you can do to help prevent falls. This information is not intended to replace advice given to you by your health care provider. Make sure you discuss any questions you have with your health care provider. Document Released: 02/19/2009 Document Revised: 10/01/2015 Document Reviewed: 05/30/2014  2017 Elsevier  Health Maintenance, Female Introduction Adopting a healthy lifestyle and getting preventive care can go a long way to promote health and wellness. Talk with your health care provider about what schedule of regular examinations is right for you. This is a good chance for you to check in with your provider about disease prevention and staying healthy. In between checkups, there are plenty of things you can do on your own. Experts have done a lot of research about which lifestyle changes and preventive measures are most likely to keep you healthy. Ask your health care provider for more information. Weight and diet Eat a healthy diet  Be sure to include plenty of vegetables, fruits, low-fat dairy products, and lean protein.  Do not eat a lot of foods high in solid fats, added  sugars, or salt.  Get regular exercise. This is one of the most important things you can do for your health.  Most adults should exercise for at least 150 minutes each week. The exercise should increase your heart rate and make you sweat (moderate-intensity exercise).  Most adults should also do strengthening exercises at least twice a week. This is in addition to the moderate-intensity exercise. Maintain a healthy weight  Body mass index (BMI) is a measurement that can be used to identify possible weight problems. It estimates body fat based on height and weight. Your health care provider can help determine your BMI and help you achieve or maintain a healthy weight.  For females 63 years of age and older:  A BMI below 18.5 is considered underweight.  A BMI of 18.5 to 24.9 is normal.  A BMI of 25 to 29.9 is considered overweight.  A BMI of 30 and above is considered obese. Watch levels of cholesterol and blood lipids  You should start having your blood tested for lipids and cholesterol at 81 years of age, then have this test every 5 years.  You may need to have your cholesterol levels checked more often if:  Your lipid or cholesterol levels are high.  You are older than 81 years of age.  You are at high risk for heart disease. Cancer screening Lung Cancer  Lung cancer screening is recommended for adults 63-69 years old who are at high risk for lung cancer because of a history of smoking.  A yearly low-dose CT scan of the lungs is recommended for people who:  Currently smoke.  Have quit within the past 15 years.  Have at least a 30-pack-year history of smoking. A pack year is smoking an average of one pack of cigarettes a day for 1 year.  Yearly screening should continue until it has been 15 years since you quit.  Yearly screening should stop if you develop a health problem that would prevent you from having lung cancer treatment. Breast Cancer  Practice breast  self-awareness. This means understanding how your breasts normally appear and feel.  It also means doing regular breast self-exams. Let your health care provider know about any changes, no matter how small.  If you are in your 20s or 30s, you should have a clinical breast exam (CBE) by a health care provider every 1-3 years as part of a regular health exam.  If you are 31 or older, have a CBE every year. Also consider having a breast X-ray (mammogram) every year.  If you have a family history of breast cancer, talk to your health care provider about genetic screening.  If you are at high risk for breast cancer, talk to your health care provider about having an MRI and a mammogram every year.  Breast cancer gene (BRCA) assessment is recommended for women who have family members with BRCA-related cancers. BRCA-related cancers include:  Breast.  Ovarian.  Tubal.  Peritoneal cancers.  Results of the assessment will determine the need for genetic counseling and BRCA1 and BRCA2 testing. Cervical Cancer  Your health care provider may recommend that you be screened regularly for cancer of the pelvic organs (ovaries, uterus, and vagina). This screening involves a pelvic examination, including checking for microscopic changes to the surface of your cervix (Pap test). You may be encouraged to have this screening done every 3 years, beginning at age 70.  For  women ages 69-65, health care providers may recommend pelvic exams and Pap testing every 3 years, or they may recommend the Pap and pelvic exam, combined with testing for human papilloma virus (HPV), every 5 years. Some types of HPV increase your risk of cervical cancer. Testing for HPV may also be done on women of any age with unclear Pap test results.  Other health care providers may not recommend any screening for nonpregnant women who are considered low risk for pelvic cancer and who do not have symptoms. Ask your health care provider if a  screening pelvic exam is right for you.  If you have had past treatment for cervical cancer or a condition that could lead to cancer, you need Pap tests and screening for cancer for at least 20 years after your treatment. If Pap tests have been discontinued, your risk factors (such as having a new sexual partner) need to be reassessed to determine if screening should resume. Some women have medical problems that increase the chance of getting cervical cancer. In these cases, your health care provider may recommend more frequent screening and Pap tests. Colorectal Cancer  This type of cancer can be detected and often prevented.  Routine colorectal cancer screening usually begins at 81 years of age and continues through 81 years of age.  Your health care provider may recommend screening at an earlier age if you have risk factors for colon cancer.  Your health care provider may also recommend using home test kits to check for hidden blood in the stool.  A small camera at the end of a tube can be used to examine your colon directly (sigmoidoscopy or colonoscopy). This is done to check for the earliest forms of colorectal cancer.  Routine screening usually begins at age 60.  Direct examination of the colon should be repeated every 5-10 years through 81 years of age. However, you may need to be screened more often if early forms of precancerous polyps or small growths are found. Skin Cancer  Check your skin from head to toe regularly.  Tell your health care provider about any new moles or changes in moles, especially if there is a change in a mole's shape or color.  Also tell your health care provider if you have a mole that is larger than the size of a pencil eraser.  Always use sunscreen. Apply sunscreen liberally and repeatedly throughout the day.  Protect yourself by wearing long sleeves, pants, a wide-brimmed hat, and sunglasses whenever you are outside. Heart disease, diabetes, and high  blood pressure  High blood pressure causes heart disease and increases the risk of stroke. High blood pressure is more likely to develop in:  People who have blood pressure in the high end of the normal range (130-139/85-89 mm Hg).  People who are overweight or obese.  People who are African American.  If you are 82-3 years of age, have your blood pressure checked every 3-5 years. If you are 66 years of age or older, have your blood pressure checked every year. You should have your blood pressure measured twice-once when you are at a hospital or clinic, and once when you are not at a hospital or clinic. Record the average of the two measurements. To check your blood pressure when you are not at a hospital or clinic, you can use:  An automated blood pressure machine at a pharmacy.  A home blood pressure monitor.  If you are between 67 years and 56 years old,  ask your health care provider if you should take aspirin to prevent strokes.  Have regular diabetes screenings. This involves taking a blood sample to check your fasting blood sugar level.  If you are at a normal weight and have a low risk for diabetes, have this test once every three years after 81 years of age.  If you are overweight and have a high risk for diabetes, consider being tested at a younger age or more often. Preventing infection Hepatitis B  If you have a higher risk for hepatitis B, you should be screened for this virus. You are considered at high risk for hepatitis B if:  You were born in a country where hepatitis B is common. Ask your health care provider which countries are considered high risk.  Your parents were born in a high-risk country, and you have not been immunized against hepatitis B (hepatitis B vaccine).  You have HIV or AIDS.  You use needles to inject street drugs.  You live with someone who has hepatitis B.  You have had sex with someone who has hepatitis B.  You get hemodialysis  treatment.  You take certain medicines for conditions, including cancer, organ transplantation, and autoimmune conditions. Hepatitis C  Blood testing is recommended for:  Everyone born from [birth year] through 1963.  Anyone with known risk factors for hepatitis C. Sexually transmitted infections (STIs)  You should be screened for sexually transmitted infections (STIs) including gonorrhea and chlamydia if:  You are sexually active and are younger than 81 years of age.  You are older than 81 years of age and your health care provider tells you that you are at risk for this type of infection.  Your sexual activity has changed since you were last screened and you are at an increased risk for chlamydia or gonorrhea. Ask your health care provider if you are at risk.  If you do not have HIV, but are at risk, it may be recommended that you take a prescription medicine daily to prevent HIV infection. This is called pre-exposure prophylaxis (PrEP). You are considered at risk if:  You are sexually active and do not regularly use condoms or know the HIV status of your partner(s).  You take drugs by injection.  You are sexually active with a partner who has HIV. Talk with your health care provider about whether you are at high risk of being infected with HIV. If you choose to begin PrEP, you should first be tested for HIV. You should then be tested every 3 months for as long as you are taking PrEP. Pregnancy  If you are premenopausal and you may become pregnant, ask your health care provider about preconception counseling.  If you may become pregnant, take 400 to 800 micrograms (mcg) of folic acid every day.  If you want to prevent pregnancy, talk to your health care provider about birth control (contraception). Osteoporosis and menopause  Osteoporosis is a disease in which the bones lose minerals and strength with aging. This can result in serious bone fractures. Your risk for osteoporosis can be  identified using a bone density scan.  If you are 75 years of age or older, or if you are at risk for osteoporosis and fractures, ask your health care provider if you should be screened.  Ask your health care provider whether you should take a calcium or vitamin D supplement to lower your risk for osteoporosis.  Menopause may have certain physical symptoms and risks.  Hormone replacement therapy  may reduce some of these symptoms and risks. Talk to your health care provider about whether hormone replacement therapy is right for you. Follow these instructions at home:  Schedule regular health, dental, and eye exams.  Stay current with your immunizations.  Do not use any tobacco products including cigarettes, chewing tobacco, or electronic cigarettes.  If you are pregnant, do not drink alcohol.  If you are breastfeeding, limit how much and how often you drink alcohol.  Limit alcohol intake to no more than 1 drink per day for nonpregnant women. One drink equals 12 ounces of beer, 5 ounces of wine, or 1 ounces of hard liquor.  Do not use street drugs.  Do not share needles.  Ask your health care provider for help if you need support or information about quitting drugs.  Tell your health care provider if you often feel depressed.  Tell your health care provider if you have ever been abused or do not feel safe at home. This information is not intended to replace advice given to you by your health care provider. Make sure you discuss any questions you have with your health care provider. Document Released: 11/08/2010 Document Revised: 10/01/2015 Document Reviewed: 01/27/2015  2017 [employer]vier

## 2016-06-03 NOTE — Progress Notes (Signed)
Krisy Dix R., DO  
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
## 2016-08-18 ENCOUNTER — Ambulatory Visit (INDEPENDENT_AMBULATORY_CARE_PROVIDER_SITE_OTHER): Payer: Medicare Other | Admitting: Family Medicine

## 2016-08-18 ENCOUNTER — Encounter: Payer: Self-pay | Admitting: Family Medicine

## 2016-08-18 ENCOUNTER — Ambulatory Visit: Payer: Medicare Other | Admitting: Family Medicine

## 2016-08-18 VITALS — BP 120/84 | HR 85 | Temp 97.6°F | Ht 64.0 in | Wt 172.9 lb

## 2016-08-18 DIAGNOSIS — E785 Hyperlipidemia, unspecified: Secondary | ICD-10-CM | POA: Diagnosis not present

## 2016-08-18 DIAGNOSIS — D649 Anemia, unspecified: Secondary | ICD-10-CM

## 2016-08-18 DIAGNOSIS — I10 Essential (primary) hypertension: Secondary | ICD-10-CM

## 2016-08-18 DIAGNOSIS — I2510 Atherosclerotic heart disease of native coronary artery without angina pectoris: Secondary | ICD-10-CM | POA: Diagnosis not present

## 2016-08-18 DIAGNOSIS — E538 Deficiency of other specified B group vitamins: Secondary | ICD-10-CM

## 2016-08-18 DIAGNOSIS — I779 Disorder of arteries and arterioles, unspecified: Secondary | ICD-10-CM

## 2016-08-18 DIAGNOSIS — I739 Peripheral vascular disease, unspecified: Secondary | ICD-10-CM

## 2016-08-18 MED ORDER — ASPIRIN EC 81 MG PO TBEC: 81.0000 mg | DELAYED_RELEASE_TABLET | Freq: Every day | ORAL | Status: DC

## 2016-08-18 NOTE — Patient Instructions (Addendum)
BEFORE YOU LEAVE: -follow up: 1) fasting lab visit in next 1-2 weeks 2) follow up with Dr. Selena Batten in about 4 months  We have ordered labs or studies at this visit. It can take up to 1-2 weeks for results and processing. IF results require follow up or explanation, we will call you with instructions. Clinically stable results will be released to your [facility]. If you have not heard from [place] or cannot find your results in [facility] in 2 weeks please contact our office at [phone].  If you are not yet signed up for [facility], please consider signing up.   We recommend the following healthy lifestyle for LIFE: 1) Small portions.   Tip: eat off of a salad plate instead of a dinner plate.  Tip: It is ok to feel hungry after a meal - that likely means you ate an appropriate portion.  Tip: if you need more or a snack choose fruits, veggies and/or a handful of nuts or seeds.  2) Eat a healthy clean diet.  * Tip: Avoid (less then 1 serving per week): processed foods, sweets, sweetened drinks, white starches (rice, flour, bread, potatoes, pasta, etc), red meat, fast foods, butter  *Tip: CHOOSE instead   * 5-9 servings per day of fresh or frozen fruits and vegetables (but not corn, potatoes, bananas, canned or dried fruit)   *nuts and seeds, beans   *olives and olive oil   *small portions of lean meats such as fish and white chicken    *small portions of whole grains  3)Get at least 150 minutes of sweaty aerobic exercise per week.  4)Reduce stress - consider counseling, meditation and relaxation to balance other aspects of your life.  WE NOW OFFER   [facility] FAST TRACK!!!  SAME DAY Appointments for ACUTE CARE  Such as: Sprains, Injuries, cuts, abrasions, rashes, muscle pain, joint pain, back pain Colds, flu, sore throats, headache, allergies, cough, fever  Ear pain, sinus and eye infections Abdominal pain, nausea, vomiting, diarrhea, upset stomach Animal/insect bites  3 Easy Ways  to Schedule: Walk-In Scheduling Call in scheduling Mychart Sign-up: [email or web address]

## 2016-08-18 NOTE — Progress Notes (Signed)
HPI:   Due for labs.  HTN//HLD: -sees Dr. Jens Som, cardiologist for hx mild CAD, mild carotid art dz -meds: hctz, losartan, metoprolol, simvastatin, asa -was not taking statin on last lab check -reports now taking statin daily -denies: CP, SOB, DOE, stain intol  B12 def/Chronic Anemia: -oral b12, iron on and off her whole life per report -colonoscopy 2014 normal -was not taking b12 last lab check - now taking -denies bleeding, dizziness, fatigue  ROS: See pertinent positives and negatives per HPI.  Past Medical History:  Diagnosis Date  . Anemia 01/18/2016   -reports on and off her whole life, on oral iron in the past remotely per report  . Arthritis   . B12 deficiency 01/18/2016  . CAD (coronary artery disease)   . CVD (cerebrovascular disease)   . Diverticulosis of colon   . H. pylori infection 10/20/2015  . Hyperlipidemia   . Hypertension   . Osteoarthritis 03/07/2006   Qualifier: Diagnosis of  By: Cato Mulligan MD, Bruce      Past Surgical History:  Procedure Laterality Date  . ABDOMINAL HYSTERECTOMY  1984  . REPLACEMENT TOTAL KNEE  2007   left  . TOTAL HIP ARTHROPLASTY  2010   Dr. Turner Daniels, right  . TOTAL KNEE ARTHROPLASTY Right 09/14/2015   Procedure: TOTAL KNEE ARTHROPLASTY;  Surgeon: Dannielle Huh, MD;  Location: [facility] OR;  Service: Orthopedics;  Laterality: Right;    Family History  Problem Relation Age of Onset  . Diabetes Mother   . Hypertension Father   . Kidney disease Father   . Heart attack Sister   . Heart attack Brother   . Colon cancer Neg Hx   . Stomach cancer Neg Hx   . Esophageal cancer Neg Hx   . Rectal cancer Neg Hx     Social History   Social History  . Marital status: Married    Spouse name: N/A  . Number of children: N/A  . Years of education: N/A   Social History Main Topics  . Smoking status: Former Smoker    Packs/day: 1.00    Years: 30.00    Quit date: 11/01/1980  . Smokeless tobacco: Never Used  . Alcohol use 0.0 oz/week   Comment: glass of wine  . Drug use: Yes  . Sexual activity: Not Asked   Other Topics Concern  . None   Social History Narrative   Updated 01/07/15   Work or School: retired from Geneticist, molecular, volunteers at [employer]. [place]      Home 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, Information systems manager and two great grandchildren age 61 and 68      Spiritual Beliefs: Christian      Lifestyle: regular exercise at the [facility]; healthy diet           Current Outpatient Prescriptions:  .  hydrochlorothiazide (HYDRODIURIL) 25 MG tablet, Take 1 tablet (25 mg total) by mouth daily., Disp: 90 tablet, Rfl: 3 .  losartan (COZAAR) 100 MG tablet, Take 1 tab daily (decreased from 1.5 tab=75 mg)., Disp: 30 tablet, Rfl: 0 .  metoprolol tartrate (LOPRESSOR) 25 MG tablet, Take 1 tablet (25 mg total) by mouth 2 (two) times daily., Disp: 180 tablet, Rfl: 3 .  Multiple Vitamin (MULTIVITAMIN WITH MINERALS) TABS, Take 1 tablet by mouth daily. , Disp: , Rfl:  .  simvastatin (ZOCOR) 40 MG tablet, Take 1 tablet (40 mg total) by mouth every evening. (Patient taking differently: Take 40 mg by mouth daily. ), Disp: 90 tablet, Rfl: 3 .  aspirin EC 81 MG tablet, Take 1 tablet (81 mg total) by mouth daily., Disp: , Rfl:   EXAM:  Vitals:   08/18/16 1609  BP: 120/84  Pulse: 85  Temp: 97.6 F (36.4 C)    Body mass index is 29.68 kg/m.  GENERAL: vitals reviewed and listed above, alert, oriented, appears well hydrated and in no acute distress  HEENT: atraumatic, conjunttiva clear, no obvious abnormalities on inspection of external nose and ears  NECK: no obvious masses on inspection  LUNGS: clear to auscultation bilaterally, no wheezes, rales or rhonchi, good air movement  CV: HRRR, no peripheral edema  MS: moves all extremities without noticeable abnormality  PSYCH: pleasant and cooperative, no obvious depression or anxiety  ASSESSMENT AND PLAN:  Discussed the following assessment and plan:  Essential hypertension - Plan:  Basic metabolic panel, CBC  Hyperlipidemia, unspecified hyperlipidemia type - Plan: Lipid panel  Atherosclerosis of native coronary artery of native heart without angina pectoris  Carotid artery disease, unspecified laterality (HCC)  B12 deficiency - Plan: Vitamin B12  Anemia, unspecified type  -lifestyle recs -recheck labs -added asa back to meds list - she is taking -follow up 4-6 months -Patient advised to return or notify a doctor immediately if symptoms worsen or persist or new concerns arise.  Patient Instructions  BEFORE YOU LEAVE: -follow up: 1) fasting lab visit in next 1-2 weeks 2) follow up with Dr. Selena Batten in about 4 months  We have ordered labs or studies at this visit. It can take up to 1-2 weeks for results and processing. IF results require follow up or explanation, we will call you with instructions. Clinically stable results will be released to your [facility]. If you have not heard from [place] or cannot find your results in [facility] in 2 weeks please contact our office at [phone].  If you are not yet signed up for [facility] please consider signing up.   We recommend the following healthy lifestyle for LIFE: 1) Small portions.   Tip: eat off of a salad plate instead of a dinner plate.  Tip: It is ok to feel hungry after a meal - that likely means you ate an appropriate portion.  Tip: if you need more or a snack choose fruits, veggies and/or a handful of nuts or seeds.  2) Eat a healthy clean diet.  * Tip: Avoid (less then 1 serving per week): processed foods, sweets, sweetened drinks, white starches (rice, flour, bread, potatoes, pasta, etc), red meat, fast foods, butter  *Tip: CHOOSE instead   * 5-9 servings per day of fresh or frozen fruits and vegetables (but not corn, potatoes, bananas, canned or dried fruit)   *nuts and seeds, beans   *olives and olive oil   *small portions of lean meats such as fish and white chicken    *small portions of whole grains  3)Get  at least 150 minutes of sweaty aerobic exercise per week.  4)Reduce stress - consider counseling, meditation and relaxation to balance other aspects of your life.  WE NOW OFFER   [facility] FAST TRACK!!!  SAME DAY Appointments for ACUTE CARE  Such as: Sprains, Injuries, cuts, abrasions, rashes, muscle pain, joint pain, back pain Colds, flu, sore throats, headache, allergies, cough, fever  Ear pain, sinus and eye infections Abdominal pain, nausea, vomiting, diarrhea, upset stomach Animal/insect bites  3 Easy Ways to Schedule: Walk-In Scheduling Call in scheduling Mychart Sign-up: [email or web address]  Colin Benton R., DO

## 2016-08-18 NOTE — Progress Notes (Signed)
Pre visit review using our clinic review tool, if applicable. No additional management support is needed unless otherwise documented below in the visit note. 

## 2016-08-25 ENCOUNTER — Other Ambulatory Visit: Payer: Medicare Other

## 2016-08-26 ENCOUNTER — Other Ambulatory Visit (INDEPENDENT_AMBULATORY_CARE_PROVIDER_SITE_OTHER): Payer: Medicare Other

## 2016-08-26 DIAGNOSIS — E538 Deficiency of other specified B group vitamins: Secondary | ICD-10-CM | POA: Diagnosis not present

## 2016-08-26 DIAGNOSIS — I10 Essential (primary) hypertension: Secondary | ICD-10-CM | POA: Diagnosis not present

## 2016-08-26 DIAGNOSIS — E785 Hyperlipidemia, unspecified: Secondary | ICD-10-CM

## 2016-08-26 LAB — BASIC METABOLIC PANEL
BUN: 18 mg/dL (ref 6–23)
CHLORIDE: 100 meq/L (ref 96–112)
CO2: 32 meq/L (ref 19–32)
CREATININE: 1.06 mg/dL (ref 0.40–1.20)
Calcium: 9.3 mg/dL (ref 8.4–10.5)
GFR: 63.91 mL/min (ref >60.00)
Glucose, Bld: 101 mg/dL — ABNORMAL HIGH (ref 70–99)
POTASSIUM: 3.7 meq/L (ref 3.5–5.1)
SODIUM: 138 meq/L (ref 135–145)

## 2016-08-26 LAB — CBC
HEMATOCRIT: 39.1 % (ref 36.0–46.0)
HEMOGLOBIN: 12.7 g/dL (ref 12.0–15.0)
MCHC: 32.6 g/dL (ref 30.0–36.0)
MCV: 91.7 fl (ref 78.0–100.0)
PLATELETS: 284 10*3/uL (ref 150.0–400.0)
RBC: 4.26 Mil/uL (ref 3.87–5.11)
RDW: 14.7 % (ref 11.5–15.5)
WBC: 5.5 10*3/uL (ref 4.0–10.5)

## 2016-08-26 LAB — LIPID PANEL
CHOLESTEROL: 160 mg/dL (ref 0–200)
HDL: 66.9 mg/dL (ref >39.00)
LDL Cholesterol: 79 mg/dL (ref 0–99)
NONHDL: 92.67
Total CHOL/HDL Ratio: 2
Triglycerides: 68 mg/dL (ref 0.0–149.0)
VLDL: 13.6 mg/dL (ref 0.0–40.0)

## 2016-08-26 LAB — VITAMIN B12: VITAMIN B 12: 465 pg/mL (ref 211–911)

## 2016-09-09 ENCOUNTER — Encounter: Payer: Self-pay | Admitting: Family Medicine

## 2016-09-09 ENCOUNTER — Ambulatory Visit (INDEPENDENT_AMBULATORY_CARE_PROVIDER_SITE_OTHER): Payer: Medicare Other | Admitting: Family Medicine

## 2016-09-09 ENCOUNTER — Ambulatory Visit (INDEPENDENT_AMBULATORY_CARE_PROVIDER_SITE_OTHER)
Admission: RE | Admit: 2016-09-09 | Discharge: 2016-09-09 | Disposition: A | Discharge status: Home / Self Care | Payer: Medicare Other | Source: Ambulatory Visit | Attending: Family Medicine | Admitting: Family Medicine

## 2016-09-09 VITALS — BP 136/80 | HR 70 | Temp 98.1°F | Ht 64.0 in | Wt 178.2 lb

## 2016-09-09 DIAGNOSIS — R0789 Other chest pain: Secondary | ICD-10-CM | POA: Diagnosis not present

## 2016-09-09 DIAGNOSIS — R079 Chest pain, unspecified: Secondary | ICD-10-CM | POA: Diagnosis not present

## 2016-09-09 NOTE — Patient Instructions (Addendum)
BEFORE YOU LEAVE: -xray sheet  Get the xray.  Keep follow up as scheduled.  See Dr. Jens Somrenshaw as planned.   I hope you are feeling better soon! Seek care immediately if worsening, new concerns or you are not improving.

## 2016-09-09 NOTE — Progress Notes (Signed)
Pre visit review using our clinic review tool, if applicable. No additional management support is needed unless otherwise documented below in the visit note. 

## 2016-09-09 NOTE — Progress Notes (Signed)
HPI:  Acute visit for atypical chest pain: -started about 1 week ago, no pain now today -intermittent, can occur at rest or with activity, resolves with rubbing after 2-3 minutes, occurring several times daily, neg CXR about 1year ago -dull pain in L chest wall that wraps to the side -no SOB, palpitations, pressure, DOE, nausea, belching, fevers, malaise, cough, recent trauma, fatigue, malaise or any other symptoms -neg stress test with cardiology in 2015, neg CXR last year -reports has follow up with her cardiologist, Dr. Jens Somrenshaw next month (on review this is actually this month) -she and her daughter are worried about her heart -no recent travel or surgery ROS: See pertinent positives and negatives per HPI.  Past Medical History:  Diagnosis Date  . Anemia 01/18/2016   -reports on and off her whole life, on oral iron in the past remotely per report  . Arthritis   . B12 deficiency 01/18/2016  . CAD (coronary artery disease)   . CVD (cerebrovascular disease)   . Diverticulosis of colon   . H. pylori infection 10/20/2015  . Hyperlipidemia   . Hypertension   . Osteoarthritis 03/07/2006   Qualifier: Diagnosis of  By: Cato MulliganSwords MD, Bruce      Past Surgical History:  Procedure Laterality Date  . ABDOMINAL HYSTERECTOMY  1984  . REPLACEMENT TOTAL KNEE  2007   left  . TOTAL HIP ARTHROPLASTY  2010   Dr. Turner Danielsowan, right  . TOTAL KNEE ARTHROPLASTY Right 09/14/2015   Procedure: TOTAL KNEE ARTHROPLASTY;  Surgeon: Dannielle HuhSteve Lucey, MD;  Location: [facility] OR;  Service: Orthopedics;  Laterality: Right;    Family History  Problem Relation Age of Onset  . Diabetes Mother   . Hypertension Father   . Kidney disease Father   . Heart attack Sister   . Heart attack Brother   . Colon cancer Neg Hx   . Stomach cancer Neg Hx   . Esophageal cancer Neg Hx   . Rectal cancer Neg Hx     Social History   Social History  . Marital status: Married    Spouse name: N/A  . Number of children: N/A  . Years of  education: N/A   Social History Main Topics  . Smoking status: Former Smoker    Packs/day: 1.00    Years: 30.00    Quit date: 11/01/1980  . Smokeless tobacco: Never Used  . Alcohol use 0.0 oz/week     Comment: glass of wine  . Drug use: Yes  . Sexual activity: Not Asked   Other Topics Concern  . 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      Home 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, Information systems managergrandaughter and two great grandchildren age 213 and 486      Spiritual Beliefs: Christian      Lifestyle: [facility] at the [facility]; healthy diet           Current Outpatient Prescriptions:  .  aspirin EC 81 MG tablet, Take 1 tablet (81 mg total) by mouth daily., Disp: , Rfl:  .  hydrochlorothiazide (HYDRODIURIL) 25 MG tablet, Take 1 tablet (25 mg total) by mouth daily., Disp: 90 tablet, Rfl: 3 .  losartan (COZAAR) 100 MG tablet, Take 1 tab daily (decreased from 1.5 tab=75 mg)., Disp: 30 tablet, Rfl: 0 .  metoprolol tartrate (LOPRESSOR) 25 MG tablet, Take 1 tablet (25 mg total) by mouth 2 (two) times daily., Disp:  180 tablet, Rfl: 3 .  Multiple Vitamin (MULTIVITAMIN WITH MINERALS) TABS, Take 1 tablet by mouth daily. , Disp: , Rfl:  .  simvastatin (ZOCOR) 40 MG tablet, Take 1 tablet (40 mg total) by mouth every evening. (Patient taking differently: Take 40 mg by mouth daily. ), Disp: 90 tablet, Rfl: 3  EXAM:  Vitals:   09/09/16 1315  BP: 136/80  Pulse: 70  Temp: 98.1 F (36.7 C)    Body mass index is 30.59 kg/m.  GENERAL: vitals reviewed and listed above, alert, oriented, appears well hydrated and in no acute distress  HEENT: atraumatic, conjunttiva clear, no obvious abnormalities on inspection of external nose and ears  NECK: no obvious masses on inspection  LUNGS: clear to auscultation bilaterally, no wheezes, rales or rhonchi, good air movement  CV: HRRR, no peripheral edema  MS: moves all extremities  without noticeable abnormality, no chest wall TTP in the area of concern  ABD: BS+, soft, NTTP  PSYCH: pleasant and cooperative, no obvious depression or anxiety  ASSESSMENT AND PLAN:  Discussed the following assessment and plan: More than 50% of over 25 minutes spent in total in caring for this patient was spent face-to-face with the patient, counseling and/or coordinating care.    Atypical chest pain - Plan: EKG 12-Lead, DG Chest 2 View, EKG 12-Lead  -we discussed possible serious and likely etiologies, workup and treatment, treatment risks and return precautions -CXR pending - EKG ok with NSR - reviewed with cardiologist on call given her hx and he also thought EKG looked good and advised appt with her cardiologist as planned and that she call if worsening symptoms in the interim -follow up advised as scheduled -of course, we advised Kalicia  to return or notify a doctor immediately if symptoms worsen or persist or new concerns arise.   There are no Patient Instructions on file for this visit.  Kriste Basque R., DO

## 2016-09-13 ENCOUNTER — Ambulatory Visit: Payer: Medicare Other | Admitting: Cardiology

## 2016-09-14 ENCOUNTER — Telehealth: Payer: Self-pay | Admitting: Family Medicine

## 2016-09-14 ENCOUNTER — Telehealth: Payer: Self-pay | Admitting: Cardiology

## 2016-09-14 NOTE — Telephone Encounter (Signed)
Patient is still having pain under her right breast.  She will be out of town next week. Any suggestions?

## 2016-09-14 NOTE — Telephone Encounter (Signed)
° ° ° ° °  Pt said she was in last week and still feeling things are not right and would like a call back   [phone]

## 2016-09-14 NOTE — Telephone Encounter (Signed)
Left msg to communicate advice (detailed msg Ok per [facility]) and to call back if questions.

## 2016-09-14 NOTE — Telephone Encounter (Signed)
New Message   pt verbalized that she is calling for rn   Pt said she is constantly having gas pains and she had a EKG   and she said that she now wants a stress test to see what is going on  She said that she is going out of town next week and she wants to know what is going on

## 2016-09-14 NOTE — Telephone Encounter (Signed)
Needs fu in office prior to ordering studies Felicia MillersBrian Irianna Cross

## 2016-09-14 NOTE — Telephone Encounter (Signed)
Pt of Dr. Jens Somrenshaw  Returning in 2 weeks for her yearly f/u  States in last 2 weeks she had "gas pains" in left side of chest.  She has occasional intermittent discomfort relieved by rubbing. Was eval'd by PCP Dr. Selena BattenKim last week, visit included EKG and a CXR - workup OK. She was advised to keep f/u appt w Dr. Jens Somrenshaw as scheduled, call if new/worsening symptoms.  Pt reports symptoms unchanged, no new symptoms since last week, but that she really wants to do something about it before leaving on a trip next week.  She asked about possibility of scheduling stress test.  I advised to keep f/u as scheduled, & that I would fwd to Dr. Jens Somrenshaw for his recommendations - pt voiced understanding.

## 2016-09-15 NOTE — Telephone Encounter (Signed)
I left a detailed message with the information below at the pts home number. 

## 2016-09-15 NOTE — Telephone Encounter (Signed)
Would suggest she contact her cardiologist as cardiologist on call advised if issues in interim prior to her cardiology appt. Thanks.

## 2016-09-19 ENCOUNTER — Telehealth: Payer: Self-pay | Admitting: Gastroenterology

## 2016-09-19 NOTE — Telephone Encounter (Signed)
Not sure this sounds GI from that description, especially without improvement on meds noted.  But I would behappy to see her at 1:30 tomorrow if she would like to come to the office

## 2016-09-19 NOTE — Telephone Encounter (Signed)
Patient scheduled for appointment on 5/15 at 1:30 with Dr. Myrtie Neitheranis.

## 2016-09-19 NOTE — Telephone Encounter (Signed)
Patient states that for last 3 weeks, she has been having pain located at sternum, radiating to her back. She states this only bothers her during the day, denies any pain during the night. No N/V.   She has seen her primary care physician, did EKG (reviewed by cardiology) and has appointment with cardiologist on 5/25.  Patient has tried OTC Nexium, Tums with very minimal relief. Patient plans to go out of town this Friday to [place]. You have available appointment time for 5/15 if you want me to get her in.

## 2016-09-20 ENCOUNTER — Encounter (INDEPENDENT_AMBULATORY_CARE_PROVIDER_SITE_OTHER): Payer: Self-pay

## 2016-09-20 ENCOUNTER — Encounter: Payer: Self-pay | Admitting: Gastroenterology

## 2016-09-20 ENCOUNTER — Ambulatory Visit (INDEPENDENT_AMBULATORY_CARE_PROVIDER_SITE_OTHER): Payer: Medicare Other | Admitting: Gastroenterology

## 2016-09-20 VITALS — BP 142/76 | HR 76 | Ht 64.0 in | Wt 178.6 lb

## 2016-09-20 DIAGNOSIS — R0789 Other chest pain: Secondary | ICD-10-CM

## 2016-09-20 NOTE — Patient Instructions (Signed)
If you are age 81 or older, your body mass index should be between 23-30. Your Body mass index is 30.66 kg/m. If this is out of the aforementioned range listed, please consider follow up with your Primary Care Provider.  If you are age 81 or younger, your body mass index should be between 19-25. Your Body mass index is 30.66 kg/m. If this is out of the aformentioned range listed, please consider follow up with your Primary Care Provider.   Thank you for choosing [facility]  Dr Amada JupiterHenry Danis III

## 2016-09-20 NOTE — Progress Notes (Signed)
     [facility] Progress Note  Chief Complaint: Chest pain  Subjective  History:  This is an 83 year old woman known to me from an office visit in July 2017. At that time she had a prolonged postop nausea that is improved by the time of her visit with me. She was found to been H. pylori positive, treated by primary care. I ordered a stool antigen to confirm eradication, and it was negative.  She reports that for the last few weeks she has had a burning left-sided chest pain that starts under the breast and goes around toward the back on the left side. She has had no trauma to that area and has not developed a rash. She has never previously had shingles. She saw primary care on May 4 for this pain, had a negative EKG. She is due to see Dr. Jens Somrenshaw from cardiology on May 25 for her annual visit. The pain is nonexertional and not worsened by eating. She has had no improvement on several antacids, it may have been briefly improved with Gas-X. It is a faint burning discomfort that is there nearly all the time. She denies dysphagia, odynophagia, nausea, vomiting or weight loss. Her bowel habits are regular without rectal bleeding. ROS: Respiratory: no dyspnea  The patient's Past Medical, Family and Social History were reviewed and are on file in the EMR.  Objective:  Med list reviewed  Vital signs in last 24 hrs: Vitals:   09/20/16 1334  BP: (!) 142/76  Pulse: 76    Physical Exam  Well-appearing elderly woman  HEENT: sclera anicteric, oral mucosa moist without lesions  Neck: supple, no thyromegaly, JVD or lymphadenopathy  Cardiac: RRR without murmurs, S1S2 heard, no peripheral edema. No chest wall tenderness on the left side. No bruising or rash  Pulm: clear to auscultation bilaterally, normal RR and effort noted  Abdomen: soft, no tenderness, with active bowel sounds. No guarding or palpable hepatosplenomegaly.  Skin; warm and dry, no jaundice or rash  Data EKG  09/09/2016: Sinus rhythm, no ischemic changes  @ASSESSMENTPLANBEGIN @ Assessment: Encounter Diagnosis  Name Primary?  . Chest wall pain Yes   Chest wall pain of unclear cause. It does not sound digestive in nature. No further medicines to prescribe her test to perform. She has no red flag symptoms. I recommended Tylenol for pain relief and follow-up with primary care. See cardiology as scheduled   Total time 12 minutes, over half spent in counseling and coordination of care.   Charlie PitterHenry L Danis III

## 2016-09-27 ENCOUNTER — Other Ambulatory Visit: Payer: Self-pay | Admitting: Family Medicine

## 2016-09-27 DIAGNOSIS — Z1231 Encounter for screening mammogram for malignant neoplasm of breast: Secondary | ICD-10-CM

## 2016-09-27 NOTE — Progress Notes (Signed)
HPI: FU hypertension, hyperlipidemia and minimal CAD. She had a previous catheterization in August 2006 that showed minor disease in the diagonal and distal LAD. We also performed carotid Dopplers in Feb 2012 that showed 0-39% stenosis. F/U not deemed necessary as this had been stable over serial exams. Stress echo 4/15 normal. Recently seen for chest pain. Chest x-ray negative and hemoglobin normal. ECG showed no acute ST changes. Since last seen, she has had occasions of indigestion after eating certain foods. She has not had exertional chest pain. No dyspnea on exertion, orthopnea  Current Outpatient Prescriptions  Medication Sig Dispense Refill  . aspirin EC 81 MG tablet Take 1 tablet (81 mg total) by mouth daily.    . cyanocobalamin 500 MCG tablet Take 500 mcg by mouth daily.    . hydrochlorothiazide (HYDRODIURIL) 25 MG tablet Take 1 tablet (25 mg total) by mouth daily. 90 tablet 3  . losartan (COZAAR) 50 MG tablet Take 1 tablet by mouth daily.    . metoprolol tartrate (LOPRESSOR) 25 MG tablet Take 1 tablet (25 mg total) by mouth 2 (two) times daily. 180 tablet 3  . Multiple Vitamin (MULTIVITAMIN WITH MINERALS) TABS Take 1 tablet by mouth daily.     . Simethicone (GAS-X PO) Take 2 capsules by mouth daily.    . simvastatin (ZOCOR) 40 MG tablet Take 40 mg by mouth daily.     No current facility-administered medications for this visit.      Past Medical History:  Diagnosis Date  . Anemia 01/18/2016   -reports on and off her whole life, on oral iron in the past remotely per report  . Arthritis   . B12 deficiency 01/18/2016  . CAD (coronary artery disease)   . CVD (cerebrovascular disease)   . Diverticulosis of colon   . H. pylori infection 10/20/2015  . Hyperlipidemia   . Hypertension   . Osteoarthritis 03/07/2006   Qualifier: Diagnosis of  By: Cato MulliganSwords MD, Bruce      Past Surgical History:  Procedure Laterality Date  . ABDOMINAL HYSTERECTOMY  1984  . REPLACEMENT TOTAL KNEE   2007   left  . TOTAL HIP ARTHROPLASTY  2010   Dr. Turner Danielsowan, right  . TOTAL KNEE ARTHROPLASTY Right 09/14/2015   Procedure: TOTAL KNEE ARTHROPLASTY;  Surgeon: Dannielle HuhSteve Lucey, MD;  Location: [facility] OR;  Service: Orthopedics;  Laterality: Right;    Social History   Social History  . Marital status: Married    Spouse name: N/A  . Number of children: N/A  . Years of education: N/A   Occupational History  . Not on file.   Social History Main Topics  . Smoking status: Former Smoker    Packs/day: 1.00    Years: 30.00    Quit date: 11/01/1980  . Smokeless tobacco: Never Used  . Alcohol use 0.0 oz/week     Comment: glass of wine  . Drug use: No  . Sexual activity: Not on file   Other Topics Concern  . Not on file   Social History Narrative   Updated 01/07/15   Work or School: retired from [employer], volunteers at [employer] [place]      Home 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, Information systems managergrandaughter and two great grandchildren age 43 and 436      Spiritual Beliefs: Christian      Lifestyle: regular exercise at the [employer]; healthy diet          Family History  Problem Relation Age of Onset  .  Diabetes Mother   . Hypertension Father   . Kidney disease Father   . Heart attack Sister   . Heart attack Brother   . Colon cancer Neg Hx   . Stomach cancer Neg Hx   . Esophageal cancer Neg Hx   . Rectal cancer Neg Hx     ROS: no fevers or chills, productive cough, hemoptysis, dysphasia, odynophagia, melena, hematochezia, dysuria, hematuria, rash, seizure activity, orthopnea, PND, pedal edema, claudication. Remaining systems are negative.  Physical Exam: Well-developed well-nourished in no acute distress.  Skin is warm and dry.  HEENT is normal.  Neck is supple.  Chest is clear to auscultation with normal expansion.  Cardiovascular exam is regular rate and rhythm. No murmurs. Abdominal exam nontender or distended. No masses palpated. Extremities show no edema. neuro grossly intact  ECG-  09/09/2016-sinus bradycardia with minor nonspecific ST changes. personally reviewed  A/P  1 chest pain-patient with recent chest pain. Electrocardiogram showed no ST changes. Symptoms most consistent with GI etiology. She does not have exertional chest pain. We will not pursue further ischemia evaluation at this point.   2 Coronary artery disease-continue aspirin and statin.  3 hypertension-blood pressure is controlled. Continue present medications and follow.  4 hyperlipidemia-continue statin. Lipids recently checked by primary care. Check liver functions.  5 history of carotid artery disease-minimal on last Dopplers. Continue aspirin and statin.  Olga Millers, MD

## 2016-09-30 ENCOUNTER — Ambulatory Visit (INDEPENDENT_AMBULATORY_CARE_PROVIDER_SITE_OTHER): Payer: Medicare Other | Admitting: Cardiology

## 2016-09-30 ENCOUNTER — Encounter: Payer: Self-pay | Admitting: Cardiology

## 2016-09-30 VITALS — BP 122/74 | HR 70 | Ht <= 58 in | Wt 178.8 lb

## 2016-09-30 DIAGNOSIS — I2510 Atherosclerotic heart disease of native coronary artery without angina pectoris: Secondary | ICD-10-CM

## 2016-09-30 DIAGNOSIS — E7800 Pure hypercholesterolemia, unspecified: Secondary | ICD-10-CM

## 2016-09-30 DIAGNOSIS — I10 Essential (primary) hypertension: Secondary | ICD-10-CM

## 2016-09-30 DIAGNOSIS — R072 Precordial pain: Secondary | ICD-10-CM | POA: Diagnosis not present

## 2016-09-30 LAB — HEPATIC FUNCTION PANEL
ALK PHOS: 53 IU/L (ref 39–117)
ALT: 10 IU/L (ref 0–32)
AST: 22 IU/L (ref 0–40)
Albumin: 4.1 g/dL (ref 3.5–4.7)
Bilirubin Total: 0.4 mg/dL (ref 0.0–1.2)
Bilirubin, Direct: 0.14 mg/dL (ref 0.00–0.40)
Total Protein: 6.4 g/dL (ref 6.0–8.5)

## 2016-09-30 MED ORDER — METOPROLOL TARTRATE 25 MG PO TABS: 25.0000 mg | ORAL_TABLET | Freq: Two times a day (BID) | ORAL | 3 refills | Status: DC

## 2016-09-30 MED ORDER — HYDROCHLOROTHIAZIDE 25 MG PO TABS: 25.0000 mg | ORAL_TABLET | Freq: Every day | ORAL | 3 refills | Status: DC

## 2016-09-30 MED ORDER — LOSARTAN POTASSIUM 50 MG PO TABS: 50.0000 mg | ORAL_TABLET | Freq: Every day | ORAL | 3 refills | Status: DC

## 2016-09-30 MED ORDER — SIMVASTATIN 40 MG PO TABS: 40.0000 mg | ORAL_TABLET | Freq: Every day | ORAL | 3 refills | Status: DC

## 2016-09-30 NOTE — Patient Instructions (Signed)
Medication Instructions:   Refill sent to the pharmacy electronically.   Labwork:  Your physician recommends that you HAVE LAB WORK TODAY  Follow-Up:  Your physician wants you to follow-up in: 6 MONTHS WITH DR CRENSHAW You will receive a reminder letter in the mail two months in advance. If you don't receive a letter, please call our office to schedule the follow-up appointment.   If you need a refill on your cardiac medications before your next appointment, please call your pharmacy.    

## 2016-10-25 ENCOUNTER — Telehealth: Payer: Self-pay | Admitting: Family Medicine

## 2016-10-25 NOTE — Telephone Encounter (Signed)
The patient dropped off a disability placard Call patient for pick up at [phone] Disposition: Dr's Folder

## 2016-10-26 NOTE — Telephone Encounter (Signed)
Form filled out and sent to Dr. Selena BattenKim to sign.

## 2016-10-27 NOTE — Telephone Encounter (Signed)
Spoke to the pt.  Informed her that her form is available for pick up at the front desk.

## 2016-11-01 DIAGNOSIS — Z96651 Presence of right artificial knee joint: Secondary | ICD-10-CM | POA: Diagnosis not present

## 2016-11-01 DIAGNOSIS — M2341 Loose body in knee, right knee: Secondary | ICD-10-CM | POA: Diagnosis not present

## 2016-11-01 DIAGNOSIS — Z471 Aftercare following joint replacement surgery: Secondary | ICD-10-CM | POA: Diagnosis not present

## 2016-11-24 ENCOUNTER — Ambulatory Visit: Payer: Medicare Other

## 2016-12-18 NOTE — Progress Notes (Deleted)
HPI:  AVW 1/26  Felicia Cross is a pleasant 83 y.o. here for follow up. Chronic medical problems summarized below were reviewed for changes and stability and were updated as needed below. These issues and their treatment remain stable for the most part. ***. Denies CP, SOB, DOE, treatment intolerance or new symptoms.   HTN//HLD: -sees Dr. Jens Som, cardiologist for hx mild CAD, mild carotid art dz -meds: hctz, losartan, metoprolol, simvastatin, asa -was not taking statin on last lab check -reports now taking statin daily -denies: CP, SOB, DOE, stain intol  B12 def/Chronic Anemia: -oral b12, iron on and off her whole life per report -colonoscopy 2014 normal -was not taking b12 last lab check - now taking -denies bleeding, dizziness, fatigue  ROS: See pertinent positives and negatives per HPI.  Past Medical History:  Diagnosis Date  . Anemia 01/18/2016   -reports on and off her whole life, on oral iron in the past remotely per report  . Arthritis   . B12 deficiency 01/18/2016  . CAD (coronary artery disease)   . CVD (cerebrovascular disease)   . Diverticulosis of colon   . H. pylori infection 10/20/2015  . Hyperlipidemia   . Hypertension   . Osteoarthritis 03/07/2006   Qualifier: Diagnosis of  By: Cato Mulligan MD, Bruce      Past Surgical History:  Procedure Laterality Date  . ABDOMINAL HYSTERECTOMY  1984  . REPLACEMENT TOTAL KNEE  2007   left  . TOTAL HIP ARTHROPLASTY  2010   Dr. Turner Daniels, right  . TOTAL KNEE ARTHROPLASTY Right 09/14/2015   Procedure: TOTAL KNEE ARTHROPLASTY;  Surgeon: Dannielle Huh, MD;  Location: [facility] OR;  Service: Orthopedics;  Laterality: Right;    Family History  Problem Relation Age of Onset  . Diabetes Mother   . Hypertension Father   . Kidney disease Father   . Heart attack Sister   . Heart attack Brother   . Colon cancer Neg Hx   . Stomach cancer Neg Hx   . Esophageal cancer Neg Hx   . Rectal cancer Neg Hx     Social History   Social  History  . Marital status: Married    Spouse name: N/A  . Number of children: N/A  . Years of education: N/A   Social History Main Topics  . Smoking status: Former Smoker    Packs/day: 1.00    Years: 30.00    Quit date: 11/01/1980  . Smokeless tobacco: Never Used  . Alcohol use 0.0 oz/week     Comment: glass of wine  . Drug use: No  . Sexual activity: Not on file   Other Topics Concern  . Not on file   Social History Narrative   Updated 01/07/15   Work or School: retired from Geneticist, [employer], volunteers at [employer] [place]      Home 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, Information systems manager and two great grandchildren age 4 and 68      Spiritual Beliefs: Christian      Lifestyle: regular exercise at the [facility]; healthy diet           Current Outpatient Prescriptions:  .  aspirin EC 81 MG tablet, Take 1 tablet (81 mg total) by mouth daily., Disp: , Rfl:  .  cyanocobalamin 500 MCG tablet, Take 500 mcg by mouth daily., Disp: , Rfl:  .  hydrochlorothiazide (HYDRODIURIL) 25 MG tablet, Take 1 tablet (25 mg total) by mouth daily., Disp: 90 tablet, Rfl: 3 .  losartan (COZAAR) 50 MG tablet,  Take 1 tablet (50 mg total) by mouth daily., Disp: 90 tablet, Rfl: 3 .  metoprolol tartrate (LOPRESSOR) 25 MG tablet, Take 1 tablet (25 mg total) by mouth 2 (two) times daily., Disp: 180 tablet, Rfl: 3 .  Multiple Vitamin (MULTIVITAMIN WITH MINERALS) TABS, Take 1 tablet by mouth daily. , Disp: , Rfl:  .  simvastatin (ZOCOR) 40 MG tablet, Take 1 tablet (40 mg total) by mouth daily., Disp: 90 tablet, Rfl: 3  EXAM:  There were no vitals filed for this visit.  There is no height or weight on file to calculate BMI.  GENERAL: vitals reviewed and listed above, alert, oriented, appears well hydrated and in no acute distress  HEENT: atraumatic, conjunttiva clear, no obvious abnormalities on inspection of external nose and ears  NECK: no obvious masses on inspection  LUNGS: clear to auscultation bilaterally, no  wheezes, rales or rhonchi, good air movement  CV: HRRR, no peripheral edema  MS: moves all extremities without noticeable abnormality  PSYCH: pleasant and cooperative, no obvious depression or anxiety  ASSESSMENT AND PLAN:  Discussed the following assessment and plan:  No diagnosis found.  -Patient advised to return or notify a doctor immediately if symptoms worsen or persist or new concerns arise.  There are no Patient Instructions on file for this visit.  Kriste BasqueKIM, Claris Pech R., DO

## 2016-12-19 ENCOUNTER — Telehealth: Payer: Self-pay | Admitting: Family Medicine

## 2016-12-19 ENCOUNTER — Ambulatory Visit: Payer: Medicare Other | Admitting: Family Medicine

## 2016-12-19 DIAGNOSIS — Z0289 Encounter for other administrative examinations: Secondary | ICD-10-CM

## 2016-12-19 NOTE — Telephone Encounter (Signed)
Pt called at 8:25 this am to cancel her 9:15 am appt for today, same day. Pt states she is out of town and just forgot to call. Pt did not say it was an emergency trip, only that she "forgot". Please advise if ok to cancel or charge.

## 2016-12-19 NOTE — Telephone Encounter (Signed)
Think this would be a no-show/late cancel according to our policies? I would tend to leave it on the schedule for tracking purposes, but schedule another appointment if needed for last minute cancellations and no shows. To my knowledge providers do not handle these inquiries any longer, this would be for the office administrator to determine if outside of policy or exception to policy is to be made. Thank you.

## 2017-01-12 ENCOUNTER — Other Ambulatory Visit: Payer: Self-pay | Admitting: Family Medicine

## 2017-01-18 DIAGNOSIS — Z1231 Encounter for screening mammogram for malignant neoplasm of breast: Secondary | ICD-10-CM | POA: Diagnosis not present

## 2017-01-18 DIAGNOSIS — Z01411 Encounter for gynecological examination (general) (routine) with abnormal findings: Secondary | ICD-10-CM | POA: Diagnosis not present

## 2017-02-28 ENCOUNTER — Telehealth: Payer: Self-pay | Admitting: Cardiology

## 2017-02-28 NOTE — Telephone Encounter (Signed)
Spoke with pt, Aware of dr crenshaw's recommendations.  °

## 2017-02-28 NOTE — Telephone Encounter (Signed)
Agree with one year fu Felicia MillersBrian Owens Hara

## 2017-02-28 NOTE — Telephone Encounter (Signed)
Left message to call back  

## 2017-02-28 NOTE — Telephone Encounter (Signed)
New Message  Pt call requesting to speak with RN to see why she needs a f/u appt for nov when she was seen in may. Please call back to discuss

## 2017-02-28 NOTE — Telephone Encounter (Signed)
Spoke with patient and she is scheduled for 6 month follow up in November. She stated she is doing fine and would like to only be seen once a year unless she is having problems. Will forward to Dr Jens Somrenshaw for review

## 2017-03-01 ENCOUNTER — Telehealth: Payer: Self-pay | Admitting: Cardiology

## 2017-03-01 NOTE — Telephone Encounter (Signed)
New message     pt verbalized that she is returning call for rn

## 2017-03-01 NOTE — Telephone Encounter (Signed)
Patient was returning [name], [place] call. Appeared both nurses called patient at same time for same issue. Apologized to patient for inconvenience. Advised her to call if she needs to see MD sooner than her yearly visit.

## 2017-03-20 ENCOUNTER — Encounter: Payer: Self-pay | Admitting: Family Medicine

## 2017-04-10 ENCOUNTER — Ambulatory Visit (INDEPENDENT_AMBULATORY_CARE_PROVIDER_SITE_OTHER): Payer: Medicare Other

## 2017-04-10 DIAGNOSIS — Z23 Encounter for immunization: Secondary | ICD-10-CM

## 2017-09-27 NOTE — Progress Notes (Signed)
HPI: FU hypertension, hyperlipidemia and minimal CAD. She had a previous catheterization in August 2006 that showed minor disease in the diagonal and distal LAD. We also performed carotid Dopplers in Feb 2012 that showed 0-39% stenosis. F/U not deemed necessary as this had been stable over serial exams. Stress echo 4/15 normal.  Since last seen, the patient denies any dyspnea on exertion, orthopnea, PND, pedal edema, palpitations, syncope or chest pain.   Current Outpatient Medications  Medication Sig Dispense Refill  . aspirin EC 81 MG tablet Take 1 tablet (81 mg total) by mouth daily.    . cyanocobalamin 500 MCG tablet Take 500 mcg by mouth daily.    . hydrochlorothiazide (HYDRODIURIL) 25 MG tablet Take 1 tablet (25 mg total) by mouth daily. 90 tablet 3  . losartan (COZAAR) 100 MG tablet TAKE ONE TABLET BY MOUTH ONCE DAILY 90 tablet 3  . metoprolol tartrate (LOPRESSOR) 25 MG tablet Take 1 tablet (25 mg total) by mouth 2 (two) times daily. 180 tablet 3  . Multiple Vitamin (MULTIVITAMIN WITH MINERALS) TABS Take 1 tablet by mouth daily.     . simvastatin (ZOCOR) 40 MG tablet Take 1 tablet (40 mg total) by mouth daily. 90 tablet 3   No current facility-administered medications for this visit.      Past Medical History:  Diagnosis Date  . Anemia 01/18/2016   -reports on and off her whole life, on oral iron in the past remotely per report  . Arthritis   . B12 deficiency 01/18/2016  . CAD (coronary artery disease)   . CVD (cerebrovascular disease)   . Diverticulosis of colon   . H. pylori infection 10/20/2015  . Hyperlipidemia   . Hypertension   . Osteoarthritis 03/07/2006   Qualifier: Diagnosis of  By: Cato Mulligan MD, Bruce      Past Surgical History:  Procedure Laterality Date  . ABDOMINAL HYSTERECTOMY  1984  . REPLACEMENT TOTAL KNEE  2007   left  . TOTAL HIP ARTHROPLASTY  2010   Dr. Turner Daniels, right  . TOTAL KNEE ARTHROPLASTY Right 09/14/2015   Procedure: TOTAL KNEE ARTHROPLASTY;   Surgeon: Dannielle Huh, MD;  Location: [facility] OR;  Service: Orthopedics;  Laterality: Right;    Social History   Socioeconomic History  . Marital status: Married    Spouse name: Not on file  . Number of children: Not on file  . Years of education: Not on file  . Highest education level: Not on file  Occupational History  . Not on file  Social Needs  . Financial resource strain: Not on file  . Food insecurity:    Worry: Not on file    Inability: Not on file  . Transportation needs:    Medical: Not on file    Non-medical: Not on file  Tobacco Use  . Smoking status: Former Smoker    Packs/day: 1.00    Years: 30.00    Pack years: 30.00    Last attempt to quit: 11/01/1980    Years since quitting: 36.9  . Smokeless tobacco: Never Used  Substance and Sexual Activity  . Alcohol use: Yes    Alcohol/week: 0.0 oz    Comment: glass of wine  . Drug use: No  . Sexual activity: Not on file  Lifestyle  . Physical activity:    Days per week: Not on file    Minutes per session: Not on file  . Stress: Not on file  Relationships  . Social connections:  Talks on phone: Not on file    Gets together: Not on file    Attends religious service: Not on file    Active member of club or organization: Not on file    Attends meetings of clubs or organizations: Not on file    Relationship status: Not on file  . Intimate partner violence:    Fear of current or ex partner: Not on file    Emotionally abused: Not on file    Physically abused: Not on file    Forced sexual activity: Not on file  Other Topics Concern  . 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 [place]      Home 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, Information systems manager and two great grandchildren age 27 and 27      Spiritual Beliefs: Christian      Lifestyle: regular exercise at the [facility]; healthy diet       Family History  Problem Relation Age of Onset  .  Diabetes Mother   . Hypertension Father   . Kidney disease Father   . Heart attack Sister   . Heart attack Brother   . Colon cancer Neg Hx   . Stomach cancer Neg Hx   . Esophageal cancer Neg Hx   . Rectal cancer Neg Hx     ROS: no fevers or chills, productive cough, hemoptysis, dysphasia, odynophagia, melena, hematochezia, dysuria, hematuria, rash, seizure activity, orthopnea, PND, pedal edema, claudication. Remaining systems are negative.  Physical Exam: Well-developed well-nourished in no acute distress.  Skin is warm and dry.  HEENT is normal.  Neck is supple.  Chest is clear to auscultation with normal expansion.  Cardiovascular exam is regular rate and rhythm.  Abdominal exam nontender or distended. No masses palpated. Extremities show no edema. neuro grossly intact  ECG-normal sinus rhythm at a rate of 83.  No ST changes.  Personally reviewed  A/P  1 coronary artery disease-minimal on previous evaluation.  Plan to continue medical therapy.  Continue aspirin and statin.  2 hypertension-blood pressure is controlled.  Continue present medications.  Renal function and potassium monitored by primary care.  3 hyperlipidemia-continue statin.  Lipids and liver monitored by primary care.  4 carotid artery disease-minimal on previous Dopplers.  Continue aspirin and statin.  Olga Millers, MD

## 2017-10-03 ENCOUNTER — Encounter: Payer: Self-pay | Admitting: Cardiology

## 2017-10-03 ENCOUNTER — Ambulatory Visit: Payer: Medicare Other | Admitting: Cardiology

## 2017-10-03 VITALS — BP 134/87 | HR 83 | Ht 64.0 in | Wt 186.2 lb

## 2017-10-03 DIAGNOSIS — I2510 Atherosclerotic heart disease of native coronary artery without angina pectoris: Secondary | ICD-10-CM | POA: Diagnosis not present

## 2017-10-03 DIAGNOSIS — I10 Essential (primary) hypertension: Secondary | ICD-10-CM | POA: Diagnosis not present

## 2017-10-03 DIAGNOSIS — E7800 Pure hypercholesterolemia, unspecified: Secondary | ICD-10-CM | POA: Diagnosis not present

## 2017-10-03 MED ORDER — SIMVASTATIN 40 MG PO TABS: 40.0000 mg | ORAL_TABLET | Freq: Every day | ORAL | 3 refills | Status: DC

## 2017-10-03 MED ORDER — METOPROLOL TARTRATE 25 MG PO TABS: 25.0000 mg | ORAL_TABLET | Freq: Two times a day (BID) | ORAL | 3 refills | Status: DC

## 2017-10-03 NOTE — Patient Instructions (Signed)
Medication Instructions:   Refill sent to the pharmacy electronically.   Follow-Up:  Your physician wants you to follow-up in: ONE YEAR WITH DR CRENSHAW You will receive a reminder letter in the mail two months in advance. If you don't receive a letter, please call our office to schedule the follow-up appointment.   If you need a refill on your cardiac medications before your next appointment, please call your pharmacy.    

## 2017-11-05 DIAGNOSIS — M545 Low back pain: Secondary | ICD-10-CM | POA: Diagnosis not present

## 2017-11-05 NOTE — Progress Notes (Signed)
Subjective:   Felicia Cross is a 84 y.o. female who presents for Medicare Annual (Subsequent) preventive examination.  Reports health as good Can't complain Souse is still doing well  Had cardiology 5/28   Keeps 2 great - grand children and their mother  dtr is keeping them this summer  Children have schedule Middle dtr has [place]; and one in [place]   Regular [facility] Rides the bike;  Knee surgery in May;  Walks 2 to 3 miles on the treadmill  Chol/hdl 2  Diet; chicken and fish and vegetables  Breakfast; toasted struttel and sometimes peanut butter on toast Until grand kids visited Eats big breakfast on sat and Sunday   incorporates fruits and vegetables;  Barriers noted and given weight loss strategies as indicated  Obesity and weight loss plan discussed  Lipids reviewed and reducing cholesterol discussed  Chol 220   HDL 63;  LDL 142; trig 110     There are no preventive care reminders to display for this patient.  Educated regarding shignrix   Mammogram scheduled in Sept  Dexa 2016; - Dr. Selena Batten does this and will discuss with her   Cardiac Risk Factors include: advanced age (>25men, >43 women);dyslipidemia;family history of premature cardiovascular disease;hypertension   Loves to read in the book club 120 80     Objective:     Vitals: BP 120/80   Pulse 70   Ht 5\' 4"  (1.626 m)   Wt 181 lb (82.1 kg)   SpO2 98%   BMI 31.07 kg/m   Body mass index is 31.07 kg/m.  Advanced Directives 11/06/2017 06/03/2016 09/04/2015  Does Patient Have a Medical Advance Directive? Yes No No  Would patient like information on creating a medical advance directive? - - No - patient declined information   States her kids made her do this   Tobacco Social History   Tobacco Use  Smoking Status Former Smoker  . Packs/day: 1.00  . Years: 30.00  . Pack years: 30.00  . Last attempt to quit: 11/01/1980  . Years since quitting: 37.0    Smokeless Tobacco Never Used     Counseling given: Yes   Clinical Intake:    Past Medical History:  Diagnosis Date  . Anemia 01/18/2016   -reports on and off her whole life, on oral iron in the past remotely per report  . Arthritis   . B12 deficiency 01/18/2016  . CAD (coronary artery disease)   . CVD (cerebrovascular disease)   . Diverticulosis of colon   . H. pylori infection 10/20/2015  . Hyperlipidemia   . Hypertension   . Osteoarthritis 03/07/2006   Qualifier: Diagnosis of  By: Cato Mulligan MD, Bruce     Past Surgical History:  Procedure Laterality Date  . ABDOMINAL HYSTERECTOMY  1984  . REPLACEMENT TOTAL KNEE  2007   left  . TOTAL HIP ARTHROPLASTY  2010   Dr. Turner Daniels, right  . TOTAL KNEE ARTHROPLASTY Right 09/14/2015   Procedure: TOTAL KNEE ARTHROPLASTY;  Surgeon: Dannielle Huh, MD;  Location: [facility] OR;  Service: Orthopedics;  Laterality: Right;   Family History  Problem Relation Age of Onset  . Diabetes Mother   . Hypertension Father   . Kidney disease Father   . Heart attack Sister   . Heart attack Brother   . Colon cancer Neg Hx   . Stomach cancer Neg Hx   . Esophageal cancer Neg Hx   .  Rectal cancer Neg Hx    Social History   Socioeconomic History  . Marital status: Married    Spouse name: Not on file  . Number of children: Not on file  . Years of education: Not on file  . Highest education level: Not on file  Occupational History  . Not on file  Social Needs  . Financial resource strain: Not on file  . Food insecurity:    Worry: Not on file    Inability: Not on file  . Transportation needs:    Medical: Not on file    Non-medical: Not on file  Tobacco Use  . Smoking status: Former Smoker    Packs/day: 1.00    Years: 30.00    Pack years: 30.00    Last attempt to quit: 11/01/1980    Years since quitting: 37.0  . Smokeless tobacco: Never Used  Substance and Sexual Activity  . Alcohol use: Yes    Alcohol/week: 0.0 oz    Comment: glass of wine  . Drug  use: No  . Sexual activity: Not on file  Lifestyle  . Physical activity:    Days per week: Not on file    Minutes per session: Not on file  . Stress: Not on file  Relationships  . Social connections:    Talks on phone: Not on file    Gets together: Not on file    Attends religious service: Not on file    Active member of club or organization: Not on file    Attends meetings of clubs or organizations: Not on file    Relationship status: Not on file  Other Topics Concern  . 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 [place]      Home 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, Information systems manager and two great grandchildren age 34 and 34      Spiritual Beliefs: Christian      Lifestyle: regular exercise at the [facility]; healthy diet       Outpatient Encounter Medications as of 11/06/2017  Medication Sig  . aspirin EC 81 MG tablet Take 1 tablet (81 mg total) by mouth daily.  . cyanocobalamin 500 MCG tablet Take 500 mcg by mouth daily.  . hydrochlorothiazide (HYDRODIURIL) 25 MG tablet Take 1 tablet (25 mg total) by mouth daily.  Marland Kitchen losartan (COZAAR) 100 MG tablet TAKE ONE TABLET BY MOUTH ONCE DAILY  . metoprolol tartrate (LOPRESSOR) 25 MG tablet Take 1 tablet (25 mg total) by mouth 2 (two) times daily.  . Multiple Vitamin (MULTIVITAMIN WITH MINERALS) TABS Take 1 tablet by mouth daily.   . simvastatin (ZOCOR) 40 MG tablet Take 1 tablet (40 mg total) by mouth daily.   No facility-administered encounter medications on file as of 11/06/2017.     Activities of Daily Living In your present state of health, do you have any difficulty performing the following activities: 11/06/2017  Hearing? N  Vision? N  Difficulty concentrating or making decisions? N  Comment still in book club and the bible study at  church  Walking or climbing stairs? Y  Dressing or bathing? N  Doing errands, shopping? N  Preparing Food and eating ? N  Using  the Toilet? N  In the past six months, have you accidently leaked urine? N  Do you have problems with loss of bowel control? N  Managing your Medications? N  Managing your Finances? N  Housekeeping or managing your Housekeeping? N  Some recent data might be hidden    Patient Care Team: Terressa KoyanagiKim, Hannah R, DO as PCP - General (Family Medicine) Kirkland HunStringer, Arthur, MD as Consulting Physician (Obstetrics and Gynecology) Lewayne Buntingrenshaw, Brian S, MD as Consulting Physician (Cardiology) Dannielle HuhLucey, Steve, MD as Consulting Physician (Orthopedic Surgery)    Assessment:   This is a routine wellness examination for Felicia Cross.  Exercise Activities and Dietary recommendations Current Exercise Habits: Home exercise routine, Type of exercise: walking;strength training/weights, Time (Minutes): 60, Frequency (Times/Week): 3, Weekly Exercise (Minutes/Week): 180  Goals    . Patient Stated     [facility]  [facility]        Fall Risk Fall Risk  11/06/2017 06/03/2016 04/18/2016 04/10/2015 04/30/2014  Falls in the past year? No Yes No No No  Comment - fell in bathroom one month ago  - - -     Depression Screen PHQ 2/9 Scores 11/06/2017 06/03/2016 04/18/2016 04/10/2015  PHQ - 2 Score 0 0 0 0     Cognitive Function MMSE - Mini Mental State Exam 11/06/2017 06/03/2016  Not completed: (No Data) (No Data)   Ad8 score reviewed for issues:  Issues making decisions:  Less interest in hobbies / activities:  Repeats questions, stories (family complaining):  Trouble using ordinary gadgets (microwave, computer, phone):  Forgets the month or year:   Mismanaging finances:   Remembering appts:  Daily problems with thinking and/or memory: Ad8 score is=0          Immunization History  Administered Date(s) Administered  . Influenza Whole 02/07/2011  . Influenza, High Dose Seasonal PF 01/07/2015, 01/23/2015, 01/18/2016, 04/10/2017  . Pneumococcal Conjugate-13 04/30/2014  . Pneumococcal  Polysaccharide-23 05/09/2002  . Td 05/09/2004, 05/09/2008  . Zoster 04/10/2015     Screening Tests Health Maintenance  Topic Date Due  . INFLUENZA VACCINE  12/07/2017  . TETANUS/TDAP  05/09/2018  . PNA vac Low Risk Adult  Completed  . DEXA SCAN  Addressed          Plan:     PCP Notes   Health Maintenance Educated regarding shignrix   Educated regarding next tetanus with pertussis  Mammogram scheduled in Sept  Dexa 2016; - Dr. Selena BattenKim does this and will discuss with her if she wants her to repeat    Finished her HCPOA and will try to bring a copy   Abnormal Screens  none  Referrals  none  Patient concerns; none  Nurse Concerns; As noted  Next PCP apt Just seen Dr. Jens Somrenshaw; coming in to see Dr. Selena BattenKim for blood work. Will make apt when she leave today       I have personally reviewed and noted the following in the patient's chart:   . Medical and social history . Use of alcohol, tobacco or illicit drugs  . Current medications and supplements . Functional ability and status . Nutritional status . Physical activity . Advanced directives . List of other physicians . Hospitalizations, surgeries, and ER visits in previous 12 months . Vitals . Screenings to include cognitive, depression, and falls . Referrals and appointments  In addition, I have reviewed and discussed with patient certain preventive protocols, quality metrics, and best practice recommendations. A written personalized care plan for preventive services as well as general preventive health recommendations were provided to patient.     Montine CircleHauck,Mavrick Mcquigg, RN  11/06/2017

## 2017-11-06 ENCOUNTER — Ambulatory Visit (INDEPENDENT_AMBULATORY_CARE_PROVIDER_SITE_OTHER): Payer: Medicare Other

## 2017-11-06 VITALS — BP 120/80 | HR 70 | Ht 64.0 in | Wt 181.0 lb

## 2017-11-06 DIAGNOSIS — Z0000 Encounter for general adult medical examination without abnormal findings: Secondary | ICD-10-CM

## 2017-11-06 NOTE — Patient Instructions (Addendum)
Felicia Cross , Thank you for taking time to come for your Medicare Wellness Visit. I appreciate your ongoing commitment to your health goals. Please review the following plan we discussed and let me know if I can assist you in the future.   Will make an apt with Dr. Maudie Mercury when you leave today   Shingrix is a vaccine for the prevention of Shingles in Adults 50 and older.  If you are on Medicare, the shingrix is covered under your Part D plan, so you will take both of the vaccines in the series at your pharmacy. Please check with your benefits regarding applicable copays or out of pocket expenses.  The Shingrix is given in 2 vaccines approx 8 weeks apart. You must receive the 2nd dose prior to 6 months from receipt of the first. Please have the pharmacist print out you Immunization  dates for our office records   A Tetanus is recommended every 10 years. Medicare covers a tetanus if you have a cut or wound; otherwise, there may be a charge. If you had not had a tetanus with pertusses, known as the Tdap, you can take this anytime.   If you have access to your HCPOA; please bring [place] a copy     These are the goals we discussed: Goals    . Patient Stated     [facility]        This is a list of the screening recommended for you and due dates:  Health Maintenance  Topic Date Due  . Flu Shot  12/07/2017  . Tetanus Vaccine  05/09/2018  . Pneumonia vaccines  Completed  . DEXA scan (bone density measurement)  Addressed     Bone Densitometry Bone densitometry is an imaging test that uses a special X-ray to measure the amount of calcium and other minerals in your bones (bone density). This test is also known as a bone mineral density test or dual-energy X-ray absorptiometry (DXA). The test can measure bone density at your hip and your spine. It is similar to having a regular X-ray. You may have this test to:  Diagnose a condition that causes weak or thin bones  (osteoporosis).  Predict your risk of a broken bone (fracture).  Determine how well osteoporosis treatment is working.  Tell a health care provider about:  Any allergies you have.  All medicines you are taking, including vitamins, herbs, eye drops, creams, and over-the-counter medicines.  Any problems you or family members have had with anesthetic medicines.  Any blood disorders you have.  Any surgeries you have had.  Any medical conditions you have.  Possibility of pregnancy.  Any other medical test you had within the previous 14 days that used contrast material. What are the risks? Generally, this is a safe procedure. However, problems can occur and may include the following:  This test exposes you to a very small amount of radiation.  The risks of radiation exposure may be greater to unborn children.  What happens before the procedure?  Do not take any calcium supplements for 24 hours before having the test. You can otherwise eat and drink what you usually do.  Take off all metal jewelry, eyeglasses, dental appliances, and any other metal objects. What happens during the procedure?  You may lie on an exam table. There will be an X-ray generator below you and an imaging device above you.  Other devices, such as boxes  or braces, may be used to position your body properly for the scan.  You will need to lie still while the machine slowly scans your body.  The images will show up on a computer monitor. What happens after the procedure? You may need more testing at a later time. This information is not intended to replace advice given to you by your health care provider. Make sure you discuss any questions you have with your health care provider. Document Released: 05/17/2004 Document Revised: 10/01/2015 Document Reviewed: 10/03/2013 Elsevier Interactive Patient Education  2018 [place] in the Home Falls can cause injuries. They can happen to  people of all ages. There are many things you can do to make your home safe and to help prevent falls. What can I do on the outside of my home?  Regularly fix the edges of walkways and driveways and fix any cracks.  Remove anything that might make you trip as you walk through a door, such as a raised step or threshold.  Trim any bushes or trees on the path to your home.  Use bright outdoor lighting.  Clear any walking paths of anything that might make someone trip, such as rocks or tools.  Regularly check to see if handrails are loose or broken. Make sure that both sides of any steps have handrails.  Any raised decks and porches should have guardrails on the edges.  Have any leaves, snow, or ice cleared regularly.  Use sand or salt on walking paths during winter.  Clean up any spills in your garage right away. This includes oil or grease spills. What can I do in the bathroom?  Use night lights.  Install grab bars by the toilet and in the tub and shower. Do not use towel bars as grab bars.  Use non-skid mats or decals in the tub or shower.  If you need to sit down in the shower, use a plastic, non-slip stool.  Keep the floor dry. Clean up any water that spills on the floor as soon as it happens.  Remove soap buildup in the tub or shower regularly.  Attach bath mats securely with double-sided non-slip rug tape.  Do not have throw rugs and other things on the floor that can make you trip. What can I do in the bedroom?  Use night lights.  Make sure that you have a light by your bed that is easy to reach.  Do not use any sheets or blankets that are too big for your bed. They should not hang down onto the floor.  Have a firm chair that has side arms. You can use this for support while you get dressed.  Do not have throw rugs and other things on the floor that can make you trip. What can I do in the kitchen?  Clean up any spills right away.  Avoid walking on wet  floors.  Keep items that you use a lot in easy-to-reach places.  If you need to reach something above you, use a strong step stool that has a grab bar.  Keep electrical cords out of the way.  Do not use floor polish or wax that makes floors slippery. If you must use wax, use non-skid floor wax.  Do not have throw rugs and other things on the floor that can make you trip. What can I do with my stairs?  Do not leave any items on the stairs.  Make sure that there are handrails  on both sides of the stairs and use them. Fix handrails that are broken or loose. Make sure that handrails are as long as the stairways.  Check any carpeting to make sure that it is firmly attached to the stairs. Fix any carpet that is loose or worn.  Avoid having throw rugs at the top or bottom of the stairs. If you do have throw rugs, attach them to the floor with carpet tape.  Make sure that you have a light switch at the top of the stairs and the bottom of the stairs. If you do not have them, ask someone to add them for you. What else can I do to help prevent falls?  Wear shoes that: ? Do not have high heels. ? Have rubber bottoms. ? Are comfortable and fit you well. ? Are closed at the toe. Do not wear sandals.  If you use a stepladder: ? Make sure that it is fully opened. Do not climb a closed stepladder. ? Make sure that both sides of the stepladder are locked into place. ? Ask someone to hold it for you, if possible.  Clearly mark and make sure that you can see: ? Any grab bars or handrails. ? First and last steps. ? Where the edge of each step is.  Use tools that help you move around (mobility aids) if they are needed. These include: ? Canes. ? Walkers. ? Scooters. ? Crutches.  Turn on the lights when you go into a dark area. Replace any light bulbs as soon as they burn out.  Set up your furniture so you have a clear path. Avoid moving your furniture around.  If any of your floors are  uneven, fix them.  If there are any pets around you, be aware of where they are.  Review your medicines with your doctor. Some medicines can make you feel dizzy. This can increase your chance of falling. Ask your doctor what other things that you can do to help prevent falls. This information is not intended to replace advice given to you by your health care provider. Make sure you discuss any questions you have with your health care provider. Document Released: 02/19/2009 Document Revised: 10/01/2015 Document Reviewed: 05/30/2014 Elsevier Interactive Patient Education  2018 [place] Maintenance, Female Adopting a healthy lifestyle and getting preventive care can go a long way to promote health and wellness. Talk with your health care provider about what schedule of regular examinations is right for you. This is a good chance for you to check in with your provider about disease prevention and staying healthy. In between checkups, there are plenty of things you can do on your own. Experts have done a lot of research about which lifestyle changes and preventive measures are most likely to keep you healthy. Ask your health care provider for more information. Weight and diet Eat a healthy diet  Be sure to include plenty of vegetables, fruits, low-fat dairy products, and lean protein.  Do not eat a lot of foods high in solid fats, added sugars, or salt.  Get regular exercise. This is one of the most important things you can do for your health. ? Most adults should exercise for at least 150 minutes each week. The exercise should increase your heart rate and make you sweat (moderate-intensity exercise). ? Most adults should also do strengthening exercises at least twice a week. This is in addition to the moderate-intensity exercise.  Maintain a healthy weight  Body mass  index (BMI) is a measurement that can be used to identify possible weight problems. It estimates body fat based on  height and weight. Your health care provider can help determine your BMI and help you achieve or maintain a healthy weight.  For females 62 years of age and older: ? A BMI below 18.5 is considered underweight. ? A BMI of 18.5 to 24.9 is normal. ? A BMI of 25 to 29.9 is considered overweight. ? A BMI of 30 and above is considered obese.  Watch levels of cholesterol and blood lipids  You should start having your blood tested for lipids and cholesterol at 82 years of age, then have this test every 5 years.  You may need to have your cholesterol levels checked more often if: ? Your lipid or cholesterol levels are high. ? You are older than 82 years of age. ? You are at high risk for heart disease.  Cancer screening Lung Cancer  Lung cancer screening is recommended for adults 60-52 years old who are at high risk for lung cancer because of a history of smoking.  A yearly low-dose CT scan of the lungs is recommended for people who: ? Currently smoke. ? Have quit within the past 15 years. ? Have at least a 30-pack-year history of smoking. A pack year is smoking an average of one pack of cigarettes a day for 1 year.  Yearly screening should continue until it has been 15 years since you quit.  Yearly screening should stop if you develop a health problem that would prevent you from having lung cancer treatment.  Breast Cancer  Practice breast self-awareness. This means understanding how your breasts normally appear and feel.  It also means doing regular breast self-exams. Let your health care provider know about any changes, no matter how small.  If you are in your 20s or 30s, you should have a clinical breast exam (CBE) by a health care provider every 1-3 years as part of a regular health exam.  If you are 19 or older, have a CBE every year. Also consider having a breast X-ray (mammogram) every year.  If you have a family history of breast cancer, talk to your health care provider  about genetic screening.  If you are at high risk for breast cancer, talk to your health care provider about having an MRI and a mammogram every year.  Breast cancer gene (BRCA) assessment is recommended for women who have family members with BRCA-related cancers. BRCA-related cancers include: ? Breast. ? Ovarian. ? Tubal. ? Peritoneal cancers.  Results of the assessment will determine the need for genetic counseling and BRCA1 and BRCA2 testing.  Cervical Cancer Your health care provider may recommend that you be screened regularly for cancer of the pelvic organs (ovaries, uterus, and vagina). This screening involves a pelvic examination, including checking for microscopic changes to the surface of your cervix (Pap test). You may be encouraged to have this screening done every 3 years, beginning at age 104.  For women ages 50-65, health care providers may recommend pelvic exams and Pap testing every 3 years, or they may recommend the Pap and pelvic exam, combined with testing for human papilloma virus (HPV), every 5 years. Some types of HPV increase your risk of cervical cancer. Testing for HPV may also be done on women of any age with unclear Pap test results.  Other health care providers may not recommend any screening for nonpregnant women who are considered low risk for pelvic  cancer and who do not have symptoms. Ask your health care provider if a screening pelvic exam is right for you.  If you have had past treatment for cervical cancer or a condition that could lead to cancer, you need Pap tests and screening for cancer for at least 20 years after your treatment. If Pap tests have been discontinued, your risk factors (such as having a new sexual partner) need to be reassessed to determine if screening should resume. Some women have medical problems that increase the chance of getting cervical cancer. In these cases, your health care provider may recommend more frequent screening and Pap  tests.  Colorectal Cancer  This type of cancer can be detected and often prevented.  Routine colorectal cancer screening usually begins at 82 years of age and continues through 82 years of age.  Your health care provider may recommend screening at an earlier age if you have risk factors for colon cancer.  Your health care provider may also recommend using home test kits to check for hidden blood in the stool.  A small camera at the end of a tube can be used to examine your colon directly (sigmoidoscopy or colonoscopy). This is done to check for the earliest forms of colorectal cancer.  Routine screening usually begins at age 35.  Direct examination of the colon should be repeated every 5-10 years through 82 years of age. However, you may need to be screened more often if early forms of precancerous polyps or small growths are found.  Skin Cancer  Check your skin from head to toe regularly.  Tell your health care provider about any new moles or changes in moles, especially if there is a change in a mole's shape or color.  Also tell your health care provider if you have a mole that is larger than the size of a pencil eraser.  Always use sunscreen. Apply sunscreen liberally and repeatedly throughout the day.  Protect yourself by wearing long sleeves, pants, a wide-brimmed hat, and sunglasses whenever you are outside.  Heart disease, diabetes, and high blood pressure  High blood pressure causes heart disease and increases the risk of stroke. High blood pressure is more likely to develop in: ? People who have blood pressure in the high end of the normal range (130-139/85-89 mm Hg). ? People who are overweight or obese. ? People who are African American.  If you are 68-87 years of age, have your blood pressure checked every 3-5 years. If you are 83 years of age or older, have your blood pressure checked every year. You should have your blood pressure measured twice-once when you are at  a hospital or clinic, and once when you are not at a hospital or clinic. Record the average of the two measurements. To check your blood pressure when you are not at a hospital or clinic, you can use: ? An automated blood pressure machine at a pharmacy. ? A home blood pressure monitor.  If you are between 74 years and 17 years old, ask your health care provider if you should take aspirin to prevent strokes.  Have regular diabetes screenings. This involves taking a blood sample to check your fasting blood sugar level. ? If you are at a normal weight and have a low risk for diabetes, have this test once every three years after 82 years of age. ? If you are overweight and have a high risk for diabetes, consider being tested at a younger age or more often.  Preventing infection Hepatitis B  If you have a higher risk for hepatitis B, you should be screened for this virus. You are considered at high risk for hepatitis B if: ? You were born in a country where hepatitis B is common. Ask your health care provider which countries are considered high risk. ? Your parents were born in a high-risk country, and you have not been immunized against hepatitis B (hepatitis B vaccine). ? You have HIV or AIDS. ? You use needles to inject street drugs. ? You live with someone who has hepatitis B. ? You have had sex with someone who has hepatitis B. ? You get hemodialysis treatment. ? You take certain medicines for conditions, including cancer, organ transplantation, and autoimmune conditions.  Hepatitis C  Blood testing is recommended for: ? Everyone born from [birth year] through 1963. ? Anyone with known risk factors for hepatitis C.  Sexually transmitted infections (STIs)  You should be screened for sexually transmitted infections (STIs) including gonorrhea and chlamydia if: ? You are sexually active and are younger than 82 years of age. ? You are older than 82 years of age and your health care provider tells  you that you are at risk for this type of infection. ? Your sexual activity has changed since you were last screened and you are at an increased risk for chlamydia or gonorrhea. Ask your health care provider if you are at risk.  If you do not have HIV, but are at risk, it may be recommended that you take a prescription medicine daily to prevent HIV infection. This is called pre-exposure prophylaxis (PrEP). You are considered at risk if: ? You are sexually active and do not regularly use condoms or know the HIV status of your partner(s). ? You take drugs by injection. ? You are sexually active with a partner who has HIV.  Talk with your health care provider about whether you are at high risk of being infected with HIV. If you choose to begin PrEP, you should first be tested for HIV. You should then be tested every 3 months for as long as you are taking PrEP. Pregnancy  If you are premenopausal and you may become pregnant, ask your health care provider about preconception counseling.  If you may become pregnant, take 400 to 800 micrograms (mcg) of folic acid every day.  If you want to prevent pregnancy, talk to your health care provider about birth control (contraception). Osteoporosis and menopause  Osteoporosis is a disease in which the bones lose minerals and strength with aging. This can result in serious bone fractures. Your risk for osteoporosis can be identified using a bone density scan.  If you are 44 years of age or older, or if you are at risk for osteoporosis and fractures, ask your health care provider if you should be screened.  Ask your health care provider whether you should take a calcium or vitamin D supplement to lower your risk for osteoporosis.  Menopause may have certain physical symptoms and risks.  Hormone replacement therapy may reduce some of these symptoms and risks. Talk to your health care provider about whether hormone replacement therapy is right for  you. Follow these instructions at home:  Schedule regular health, dental, and eye exams.  Stay current with your immunizations.  Do not use any tobacco products including cigarettes, chewing tobacco, or electronic cigarettes.  If you are pregnant, do not drink alcohol.  If you are breastfeeding, limit how much and how often you  drink alcohol.  Limit alcohol intake to no more than 1 drink per day for nonpregnant women. One drink equals 12 ounces of beer, 5 ounces of wine, or 1 ounces of hard liquor.  Do not use street drugs.  Do not share needles.  Ask your health care provider for help if you need support or information about quitting drugs.  Tell your health care provider if you often feel depressed.  Tell your health care provider if you have ever been abused or do not feel safe at home. This information is not intended to replace advice given to you by your health care provider. Make sure you discuss any questions you have with your health care provider. Document Released: 11/08/2010 Document Revised: 10/01/2015 Document Reviewed: 01/27/2015 Elsevier Interactive Patient Education  [employer].

## 2017-11-07 NOTE — Progress Notes (Signed)
Hannah R Kim, DO  

## 2017-11-18 NOTE — Progress Notes (Signed)
HPI:  Using dictation device. Unfortunately this device frequently misinterprets words/phrases.  Felicia Cross is a pleasant 84 y.o. here for follow up. Chronic medical problems summarized below were reviewed for changes. Reports is doing well. Some stress with [place]. Eating a little more sugar and has gained some weight - plans to try to improve diet. Denies CP, SOB, DOE, treatment intolerance or new symptoms.  HTN/HLD: -sees Dr. Jens Somrenshaw, cardiologist for hx mild CAD, mild carotid art dz -meds: hctz, losartan, metoprolol, simvastatin, asa -denies: CP, SOB, DOE, stain intol  B12 def/Chronic Anemia: -oral b12, iron on and off her whole life per report -colonoscopy 2014 normal -denies bleeding, dizziness, fatigue  ROS: See pertinent positives and negatives per HPI.  Past Medical History:  Diagnosis Date  . Anemia 01/18/2016   -reports on and off her whole life, on oral iron in the past remotely per report  . Arthritis   . B12 deficiency 01/18/2016  . CAD (coronary artery disease)   . CVD (cerebrovascular disease)   . Diverticulosis of colon   . H. pylori infection 10/20/2015  . Hyperlipidemia   . Hypertension   . Osteoarthritis 03/07/2006   Qualifier: Diagnosis of  By: Cato MulliganSwords MD, Bruce      Past Surgical History:  Procedure Laterality Date  . ABDOMINAL HYSTERECTOMY  1984  . REPLACEMENT TOTAL KNEE  2007   left  . TOTAL HIP ARTHROPLASTY  2010   Dr. Turner Danielsowan, right  . TOTAL KNEE ARTHROPLASTY Right 09/14/2015   Procedure: TOTAL KNEE ARTHROPLASTY;  Surgeon: Dannielle HuhSteve Lucey, MD;  Location: [facility] OR;  Service: Orthopedics;  Laterality: Right;    Family History  Problem Relation Age of Onset  . Diabetes Mother   . Hypertension Father   . Kidney disease Father   . Heart attack Sister   . Heart attack Brother   . Colon cancer Neg Hx   . Stomach cancer Neg Hx   . Esophageal cancer Neg Hx   . Rectal cancer Neg Hx     SOCIAL HX: see  hpi   Current Outpatient Medications:  .  aspirin EC 81 MG tablet, Take 1 tablet (81 mg total) by mouth daily., Disp: , Rfl:  .  cyanocobalamin 500 MCG tablet, Take 500 mcg by mouth daily., Disp: , Rfl:  .  hydrochlorothiazide (HYDRODIURIL) 25 MG tablet, Take 1 tablet (25 mg total) by mouth daily., Disp: 90 tablet, Rfl: 3 .  losartan (COZAAR) 100 MG tablet, TAKE ONE TABLET BY MOUTH ONCE DAILY, Disp: 90 tablet, Rfl: 3 .  metoprolol tartrate (LOPRESSOR) 25 MG tablet, Take 1 tablet (25 mg total) by mouth 2 (two) times daily., Disp: 180 tablet, Rfl: 3 .  Multiple Vitamin (MULTIVITAMIN WITH MINERALS) TABS, Take 1 tablet by mouth daily. , Disp: , Rfl:  .  simvastatin (ZOCOR) 40 MG tablet, Take 1 tablet (40 mg total) by mouth daily., Disp: 90 tablet, Rfl: 3  EXAM:  Vitals:   11/20/17 1002  BP: 120/72  Pulse: 70  Temp: 97.8 F (36.6 C)    Body mass index is 31.31 kg/m.  GENERAL: vitals reviewed and listed above, alert, oriented, appears well hydrated and in no acute distress  HEENT: atraumatic, conjunttiva clear, no obvious abnormalities on inspection of external nose and ears  NECK: no obvious masses on inspection  LUNGS: clear to auscultation bilaterally, no wheezes, rales or rhonchi, good air movement  CV: HRRR, no peripheral edema  MS: moves all extremities without noticeable abnormality  PSYCH: pleasant and cooperative, no obvious depression or anxiety  ASSESSMENT AND PLAN:  Discussed the following assessment and plan:  Essential hypertension  Hyperlipidemia, unspecified hyperlipidemia type  Carotid artery disease, unspecified laterality, unspecified type (HCC)  Anemia, unspecified type  B12 deficiency  -BP better on recheck -advised healthy low sugar diet and regular exercise -labs at next follow up advised -Patient advised to follow up sooner if new concerns arise.  Patient Instructions  BEFORE YOU LEAVE: -follow up: 3-4 months - will plan to do labs  then  I hope the stress gets better!   We recommend the following healthy lifestyle for LIFE: 1) Small portions. But, make sure to get regular (at least 3 per day), healthy meals and small healthy snacks if needed.  2) Eat a healthy clean diet.   TRY TO EAT: -at least 5-7 servings of low sugar, colorful, and nutrient rich vegetables per day (not corn, potatoes or bananas.) -berries are the best choice if you wish to eat fruit (only eat small amounts if trying to reduce weight)  -lean meets (fish, white meat of chicken or [place]) -vegan proteins for some meals - beans or tofu, whole grains, nuts and seeds -Replace bad fats with good fats - good fats include: fish, nuts and seeds, canola oil, olive oil -small amounts of low fat or non fat dairy -small amounts of100 % whole grains - check the lables -drink plenty of water  AVOID: -SUGAR, sweets, anything with added sugar, corn syrup or sweeteners - must read labels as even foods advertised as "healthy" often are loaded with sugar -if you must have a sweetener, small amounts of stevia may be best -sweetened beverages and artificially sweetened beverages -simple starches (rice, bread, potatoes, pasta, chips, etc - small amounts of 100% whole grains are ok) -red meat, pork, butter -fried foods, fast food, processed food, excessive dairy, eggs and coconut.  3)Get at least 150 minutes of sweaty aerobic exercise per week.  4)Reduce stress - consider counseling, meditation and relaxation to balance other aspects of your life.     Terressa Koyanagi, DO

## 2017-11-20 ENCOUNTER — Ambulatory Visit (INDEPENDENT_AMBULATORY_CARE_PROVIDER_SITE_OTHER): Payer: Medicare Other | Admitting: Family Medicine

## 2017-11-20 ENCOUNTER — Encounter: Payer: Self-pay | Admitting: Family Medicine

## 2017-11-20 VITALS — BP 120/72 | HR 70 | Temp 97.8°F | Ht 64.0 in | Wt 182.4 lb

## 2017-11-20 DIAGNOSIS — E538 Deficiency of other specified B group vitamins: Secondary | ICD-10-CM

## 2017-11-20 DIAGNOSIS — I739 Peripheral vascular disease, unspecified: Secondary | ICD-10-CM

## 2017-11-20 DIAGNOSIS — I10 Essential (primary) hypertension: Secondary | ICD-10-CM

## 2017-11-20 DIAGNOSIS — D649 Anemia, unspecified: Secondary | ICD-10-CM | POA: Diagnosis not present

## 2017-11-20 DIAGNOSIS — E785 Hyperlipidemia, unspecified: Secondary | ICD-10-CM | POA: Diagnosis not present

## 2017-11-20 DIAGNOSIS — I779 Disorder of arteries and arterioles, unspecified: Secondary | ICD-10-CM | POA: Diagnosis not present

## 2017-11-20 NOTE — Patient Instructions (Signed)
BEFORE YOU LEAVE: -follow up: 3-4 months - will plan to do labs then  I hope the stress gets better!   We recommend the following healthy lifestyle for LIFE: 1) Small portions. But, make sure to get regular (at least 3 per day), healthy meals and small healthy snacks if needed.  2) Eat a healthy clean diet.   TRY TO EAT: -at least 5-7 servings of low sugar, colorful, and nutrient rich vegetables per day (not corn, potatoes or bananas.) -berries are the best choice if you wish to eat fruit (only eat small amounts if trying to reduce weight)  -lean meets (fish, white meat of chicken or [place]) -vegan proteins for some meals - beans or tofu, whole grains, nuts and seeds -Replace bad fats with good fats - good fats include: fish, nuts and seeds, canola oil, olive oil -small amounts of low fat or non fat dairy -small amounts of100 % whole grains - check the lables -drink plenty of water  AVOID: -SUGAR, sweets, anything with added sugar, corn syrup or sweeteners - must read labels as even foods advertised as "healthy" often are loaded with sugar -if you must have a sweetener, small amounts of stevia may be best -sweetened beverages and artificially sweetened beverages -simple starches (rice, bread, potatoes, pasta, chips, etc - small amounts of 100% whole grains are ok) -red meat, pork, butter -fried foods, fast food, processed food, excessive dairy, eggs and coconut.  3)Get at least 150 minutes of sweaty aerobic exercise per week.  4)Reduce stress - consider counseling, meditation and relaxation to balance other aspects of your life.

## 2017-12-08 ENCOUNTER — Emergency Department (HOSPITAL_COMMUNITY): Payer: Medicare Other

## 2017-12-08 ENCOUNTER — Other Ambulatory Visit: Payer: Self-pay

## 2017-12-08 ENCOUNTER — Emergency Department (HOSPITAL_COMMUNITY)
Admission: EM | Admit: 2017-12-08 | Discharge: 2017-12-09 | Disposition: A | Discharge status: Home / Self Care | Payer: Medicare Other | Attending: Emergency Medicine | Admitting: Emergency Medicine

## 2017-12-08 DIAGNOSIS — R270 Ataxia, unspecified: Secondary | ICD-10-CM | POA: Diagnosis not present

## 2017-12-08 DIAGNOSIS — E785 Hyperlipidemia, unspecified: Secondary | ICD-10-CM | POA: Diagnosis not present

## 2017-12-08 DIAGNOSIS — Z87891 Personal history of nicotine dependence: Secondary | ICD-10-CM | POA: Diagnosis not present

## 2017-12-08 DIAGNOSIS — Z7982 Long term (current) use of aspirin: Secondary | ICD-10-CM | POA: Diagnosis not present

## 2017-12-08 DIAGNOSIS — I491 Atrial premature depolarization: Secondary | ICD-10-CM | POA: Diagnosis not present

## 2017-12-08 DIAGNOSIS — Z79899 Other long term (current) drug therapy: Secondary | ICD-10-CM | POA: Diagnosis not present

## 2017-12-08 DIAGNOSIS — R42 Dizziness and giddiness: Secondary | ICD-10-CM | POA: Diagnosis not present

## 2017-12-08 DIAGNOSIS — I10 Essential (primary) hypertension: Secondary | ICD-10-CM | POA: Insufficient documentation

## 2017-12-08 DIAGNOSIS — J984 Other disorders of lung: Secondary | ICD-10-CM | POA: Diagnosis not present

## 2017-12-08 DIAGNOSIS — I959 Hypotension, unspecified: Secondary | ICD-10-CM | POA: Diagnosis not present

## 2017-12-08 DIAGNOSIS — I2510 Atherosclerotic heart disease of native coronary artery without angina pectoris: Secondary | ICD-10-CM | POA: Diagnosis not present

## 2017-12-08 DIAGNOSIS — R001 Bradycardia, unspecified: Secondary | ICD-10-CM | POA: Diagnosis not present

## 2017-12-08 LAB — CBC
HCT: 39.8 % (ref 36.0–46.0)
HEMOGLOBIN: 12.5 g/dL (ref 12.0–15.0)
MCH: 29.6 pg (ref 26.0–34.0)
MCHC: 31.4 g/dL (ref 30.0–36.0)
MCV: 94.1 fL (ref 78.0–100.0)
PLATELETS: 212 10*3/uL (ref 150–400)
RBC: 4.23 MIL/uL (ref 3.87–5.11)
RDW: 14.6 % (ref 11.5–15.5)
WBC: 5.4 10*3/uL (ref 4.0–10.5)

## 2017-12-08 LAB — BASIC METABOLIC PANEL
ANION GAP: 10 (ref 5–15)
BUN: 14 mg/dL (ref 8–23)
CHLORIDE: 99 mmol/L (ref 98–111)
CO2: 27 mmol/L (ref 22–32)
CREATININE: 1.16 mg/dL — AB (ref 0.44–1.00)
Calcium: 9.3 mg/dL (ref 8.9–10.3)
GFR calc non Af Amer: 43 mL/min — ABNORMAL LOW (ref >60)
GFR, EST AFRICAN AMERICAN: 49 mL/min — AB (ref >60)
Glucose, Bld: 92 mg/dL (ref 70–99)
POTASSIUM: 3.9 mmol/L (ref 3.5–5.1)
SODIUM: 136 mmol/L (ref 135–145)

## 2017-12-08 MED ORDER — PROCHLORPERAZINE EDISYLATE 10 MG/2ML IJ SOLN
5.0000 mg | Freq: Once | INTRAMUSCULAR | Status: AC
  Administered 2017-12-08: 5 mg via INTRAVENOUS
  Filled 2017-12-08: qty 2

## 2017-12-08 MED ORDER — DIPHENHYDRAMINE HCL 50 MG/ML IJ SOLN
12.5000 mg | Freq: Once | INTRAMUSCULAR | Status: AC
  Administered 2017-12-08: 12.5 mg via INTRAVENOUS
  Filled 2017-12-08: qty 1

## 2017-12-08 MED ORDER — SODIUM CHLORIDE 0.9 % IV BOLUS
1000.0000 mL | Freq: Once | INTRAVENOUS | Status: AC
  Administered 2017-12-08: 1000 mL via INTRAVENOUS

## 2017-12-08 NOTE — ED Provider Notes (Signed)
[facility] EMERGENC[facility]PART[facility] Provider Note   CSN: [id number] Arrival date & time: 12/08/17  1608     History   Chief Complaint Chief Complaint  Patient presents with  . Weakness    HPI Felicia Cross is a 84 y.o. female.  84 yo F with a chief complaint of dizziness.  She woke up this morning and felt unsteady.  Worse when she gets up and tries to move around.  She has also felt very weak today.  Denies any difficulty with speech or swallowing.  Has been mostly still throughout the day.  Denies cough congestion fevers vomiting or diarrhea.  Denies urinary symptoms.  Denies flank pain.  She is complaining of a headache to the vertex of her head.  Nothing seems to make that better or worse.  The history is provided by the patient.  Illness  This is a new problem. The current episode started 12 to 24 hours ago. The problem occurs constantly. The problem has not changed since onset.Associated symptoms include headaches. Pertinent negatives include no chest pain, no abdominal pain and no shortness of breath. Nothing aggravates the symptoms. Nothing relieves the symptoms. She has tried nothing for the symptoms. The treatment provided no relief.    Past Medical History:  Diagnosis Date  . Anemia 01/18/2016   -reports on and off her whole life, on oral iron in the past remotely per report  . Arthritis   . B12 deficiency 01/18/2016  . CAD (coronary artery disease)   . CVD (cerebrovascular disease)   . Diverticulosis of colon   . H. pylori infection 10/20/2015  . Hyperlipidemia   . Hypertension   . Osteoarthritis 03/07/2006   Qualifier: Diagnosis of  By: Cato Mulligan MD, Bruce      Patient Active Problem List   Diagnosis Date Noted  . B12 deficiency 01/18/2016  . Anemia 01/18/2016  . S/P total knee replacement 09/14/2015  . Preop cardiovascular exam 08/14/2015  . Carotid arterial disease (HCC) 05/27/2009  . Hyperlipemia 06/27/2008  . Coronary atherosclerosis  11/06/2006  . Essential hypertension 03/07/2006  . Osteoarthritis 03/07/2006    Past Surgical History:  Procedure Laterality Date  . ABDOMINAL HYSTERECTOMY  1984  . REPLACEMENT TOTAL KNEE  2007   left  . TOTAL HIP ARTHROPLASTY  2010   Dr. Turner Daniels, right  . TOTAL KNEE ARTHROPLASTY Right 09/14/2015   Procedure: TOTAL KNEE ARTHROPLASTY;  Surgeon: Dannielle Huh, MD;  Location: [facility] OR;  Service: Orthopedics;  Laterality: Right;     OB History   None      Home Medications    Prior to Admission medications   Medication Sig Start Date End Date Taking? Authorizing Provider  aspirin EC 81 MG tablet Take 1 tablet (81 mg total) by mouth daily. 08/18/16   Terressa Koyanagi, DO  cyanocobalamin 500 MCG tablet Take 500 mcg by mouth daily.    [provider]  hydrochlorothiazide (HYDRODIURIL) 25 MG tablet Take 1 tablet (25 mg total) by mouth daily. 09/30/16   Lewayne Bunting, MD  losartan (COZAAR) 100 MG tablet TAKE ONE TABLET BY MOUTH ONCE DAILY 01/12/17   Lewayne Bunting, MD  metoprolol tartrate (LOPRESSOR) 25 MG tablet Take 1 tablet (25 mg total) by mouth 2 (two) times daily. 10/03/17   Lewayne Bunting, MD  Multiple Vitamin (MULTIVITAMIN WITH MINERALS) TABS Take 1 tablet by mouth daily.     [provider]  simvastatin (ZOCOR) 40 MG tablet Take 1 tablet (40  mg total) by mouth daily. 10/03/17   Lewayne Bunting, MD    Family History Family History  Problem Relation Age of Onset  . Diabetes Mother   . Hypertension Father   . Kidney disease Father   . Heart attack Sister   . Heart attack Brother   . Colon cancer Neg Hx   . Stomach cancer Neg Hx   . Esophageal cancer Neg Hx   . Rectal cancer Neg Hx     Social History Social History   Tobacco Use  . Smoking status: Former Smoker    Packs/day: 1.00    Years: 30.00    Pack years: 30.00    Last attempt to quit: 11/01/1980    Years since quitting: 37.1  . Smokeless tobacco: Never Used  Substance Use Topics  . Alcohol use:  Yes    Alcohol/week: 0.0 oz    Comment: glass of wine  . Drug use: No     Allergies   Codeine and Hydrocodone   Review of Systems Review of Systems  Constitutional: Negative for chills and fever.  HENT: Negative for congestion and rhinorrhea.   Eyes: Negative for redness and visual disturbance.  Respiratory: Negative for shortness of breath and wheezing.   Cardiovascular: Negative for chest pain and palpitations.  Gastrointestinal: Negative for abdominal pain, nausea and vomiting.  Genitourinary: Negative for dysuria and urgency.  Musculoskeletal: Negative for arthralgias and myalgias.  Skin: Negative for pallor and wound.  Neurological: Positive for dizziness, weakness and headaches.     Physical Exam Updated Vital Signs BP (!) 161/86   Pulse 84   Temp 99.8 F (37.7 C) (Oral)   Resp 18   Ht 5\' 4"  (1.626 m)   Wt 82.6 kg (182 lb)   SpO2 97%   BMI 31.24 kg/m   Physical Exam  Constitutional: She is oriented to person, place, and time. She appears well-developed and well-nourished. No distress.  HENT:  Head: Normocephalic and atraumatic.  Eyes: Pupils are equal, round, and reactive to light. EOM are normal.  Neck: Normal range of motion. Neck supple.  Cardiovascular: Normal rate and regular rhythm. Exam reveals no gallop and no friction rub.  No murmur heard. Pulmonary/Chest: Effort normal. She has no wheezes. She has no rales.  Abdominal: Soft. She exhibits no distension. There is no tenderness.  Musculoskeletal: She exhibits no edema or tenderness.  Neurological: She is alert and oriented to person, place, and time. She has normal strength. No cranial nerve deficit or sensory deficit. Coordination and gait abnormal. GCS eye subscore is 4. GCS verbal subscore is 5. GCS motor subscore is 6.  Patient has difficulty with finger-nose with the left upper extremity she there is no wavering but she does pass my finger each time with the left upper extremity.  She is able to  ambulate with some assistance but she does wave her mostly to the right.  Skin: Skin is warm and dry. She is not diaphoretic.  Psychiatric: She has a normal mood and affect. Her behavior is normal.  Nursing note and vitals reviewed.    ED Treatments / Results  Labs (all labs ordered are listed, but only abnormal results are displayed) Labs Reviewed  BASIC METABOLIC PANEL - Abnormal; Notable for the following components:      Result Value   Creatinine, Ser 1.16 (*)    GFR calc non Af Amer 43 (*)    GFR calc Af Amer 49 (*)    All other components  within normal limits  CBC  URINALYSIS, ROUTINE W REFLEX MICROSCOPIC    EKG EKG Interpretation  Date/Time:  Friday December 08 2017 16:11:07 EDT Ventricular Rate:  101 PR Interval:  150 QRS Duration: 72 QT Interval:  324 QTC Calculation: 420 R Axis:   -12 Text Interpretation:  Sinus tachycardia with Fusion complexes Otherwise normal ECG Since last tracing rate faster Otherwise no significant change Confirmed by Melene PlanFloyd, Kandon Hosking [phone]) on 12/08/2017 9:59:19 PM   Radiology Dg Chest 2 View  Result Date: 12/08/2017 CLINICAL DATA:  Weakness and dizziness. EXAM: CHEST - 2 VIEW COMPARISON:  09/09/2016 FINDINGS: The cardiomediastinal contours are unchanged with stable mild cardiomegaly. Mild left basilar scarring. Pulmonary vasculature is normal. No consolidation, pleural effusion, or pneumothorax. No acute osseous abnormalities are seen. IMPRESSION: Stable mild cardiomegaly and left basilar scarring. No acute findings. Electronically Signed   By: Rubye OaksMelanie  Ehinger M.D.   On: 12/08/2017 22:52    Procedures Procedures (including critical care time)  Medications Ordered in ED Medications  sodium chloride 0.9 % bolus 1,000 mL (1,000 mLs Intravenous New Bag/Given 12/08/17 2258)  prochlorperazine (COMPAZINE) injection 5 mg (5 mg Intravenous Given 12/08/17 2258)  diphenhydrAMINE (BENADRYL) injection 12.5 mg (12.5 mg Intravenous Given 12/08/17 2258)      Initial Impression / Assessment and Plan / ED Course  I have reviewed the triage vital signs and the nursing notes.  Pertinent labs & imaging results that were available during my care of the patient were reviewed by me and considered in my medical decision making (see chart for details).     84 yo F with a chief complaint of dizziness.  This is described as an unsteadiness.  Started this morning.  My neuro exam is concerning for possible posterior circulation stroke.  I discussed case with Dr. Jerrell BelfastAurora, neurology recommended an MRI after which he will come and evaluate the patient. Given headache cocktail for headache and dizziness with some improvement.   Discussed with Dr. Judd Lienelo, please see his note for further details of care.   The patients results and plan were reviewed and discussed.   Any x-rays performed were independently reviewed by myself.   Differential diagnosis were considered with the presenting HPI.  Medications  sodium chloride 0.9 % bolus 1,000 mL (1,000 mLs Intravenous New Bag/Given 12/08/17 2258)  prochlorperazine (COMPAZINE) injection 5 mg (5 mg Intravenous Given 12/08/17 2258)  diphenhydrAMINE (BENADRYL) injection 12.5 mg (12.5 mg Intravenous Given 12/08/17 2258)    Vitals:   12/08/17 1615 12/08/17 2042 12/08/17 2145 12/08/17 2300  BP: (!) 150/76 (!) 146/71 (!) 157/133 (!) 161/86  Pulse: 97 97 85 84  Resp:  14 16 18   Temp: 99.8 F (37.7 C)     TempSrc: Oral     SpO2: 100% 100% 97% 97%  Weight:      Height:        Final diagnoses:  Ataxia    Admission/ observation were discussed with the admitting physician, patient and/or family and they are comfortable with the plan.    Final Clinical Impressions(s) / ED Diagnoses   Final diagnoses:  Ataxia    ED Discharge Orders    None       Melene PlanFloyd, Kermitt Harjo, DO 12/09/17 0022

## 2017-12-08 NOTE — ED Notes (Signed)
Patient transported to X-ray 

## 2017-12-08 NOTE — ED Triage Notes (Signed)
Per ems pt was at home woke up this AM around 3 and was dizzy and weak. Went back to bed woke up around 8:30 still felt dizzy and weak. No headache no visual changes. Alert oriented  x 4. 132/74 hr 96, 98% ra, cbg 133

## 2017-12-09 ENCOUNTER — Emergency Department (HOSPITAL_COMMUNITY): Payer: Medicare Other

## 2017-12-09 DIAGNOSIS — R42 Dizziness and giddiness: Secondary | ICD-10-CM | POA: Diagnosis not present

## 2017-12-09 MED ORDER — MECLIZINE HCL 25 MG PO TABS: 25.0000 mg | ORAL_TABLET | Freq: Three times a day (TID) | ORAL | 0 refills | Status: DC | PRN

## 2017-12-09 NOTE — ED Provider Notes (Signed)
Care assumed from Dr. Adela LankFloyd at shift change.  Patient presented here with complaints of dizziness that appears to be related to a peripheral vertigo.  An MRI performed this evening is negative for stroke.  This case was discussed with Dr. Wilford CornerArora from neurology who is in agreement with discharge with meclizine.  She will follow-up with ENT if symptoms persist.   Geoffery Lyonselo, Zayan Delvecchio, MD 12/09/17 [phone]

## 2017-12-09 NOTE — Discharge Instructions (Addendum)
Meclizine as prescribed as needed for vertigo.  Follow-up with ENT next week if symptoms persist.  The contact information for [facility] has been provided in this discharge summary for you to call and make these arrangements.

## 2017-12-09 NOTE — ED Notes (Signed)
Dr Delo @ bedside  

## 2017-12-13 ENCOUNTER — Encounter: Payer: Self-pay | Admitting: Internal Medicine

## 2017-12-13 ENCOUNTER — Ambulatory Visit (INDEPENDENT_AMBULATORY_CARE_PROVIDER_SITE_OTHER): Payer: Medicare Other | Admitting: Internal Medicine

## 2017-12-13 VITALS — BP 138/68 | HR 64 | Temp 98.1°F | Wt 171.0 lb

## 2017-12-13 DIAGNOSIS — H81399 Other peripheral vertigo, unspecified ear: Secondary | ICD-10-CM

## 2017-12-13 DIAGNOSIS — I10 Essential (primary) hypertension: Secondary | ICD-10-CM

## 2017-12-13 NOTE — Patient Instructions (Signed)
Follow-up with Dr. Selena BattenKim as scheduled in the fall  Report any new or worsening symptoms  No change in your medical regimen

## 2017-12-13 NOTE — Progress Notes (Signed)
Subjective:    Patient ID: Felicia Cross, female    DOB: [birth year], 84 y.o.   MRN: [id number]  HPI  84 year old patient who is seen following a recent ED visit 5 days ago.  She presented with some dizziness and sense of unsteadiness.  Evaluation included a brain MRI which revealed no evidence of acute stroke.  This was felt most likely related to peripheral vertigo.  Over the past 5 days she has done quite well.  She remains a bit unsteady but has always used a cane for some time.  She feels that she is back to baseline and her daughter agrees.    Hospital records reviewed  Past Medical History:  Diagnosis Date  . Anemia 01/18/2016   -reports on and off her whole life, on oral iron in the past remotely per report  . Arthritis   . B12 deficiency 01/18/2016  . CAD (coronary artery disease)   . CVD (cerebrovascular disease)   . Diverticulosis of colon   . H. pylori infection 10/20/2015  . Hyperlipidemia   . Hypertension   . Osteoarthritis 03/07/2006   Qualifier: Diagnosis of  By: Cato Mulligan MD, Bruce       Social History   Socioeconomic History  . Marital status: Married    Spouse name: Not on file  . Number of children: Not on file  . Years of education: Not on file  . Highest education level: Not on file  Occupational History  . Not on file  Social Needs  . Financial resource strain: Not on file  . Food insecurity:    Worry: Not on file    Inability: Not on file  . Transportation needs:    Medical: Not on file    Non-medical: Not on file  Tobacco Use  . Smoking status: Former Smoker    Packs/day: 1.00    Years: 30.00    Pack years: 30.00    Last attempt to quit: 11/01/1980    Years since quitting: 37.1  . Smokeless tobacco: Never Used  Substance and Sexual Activity  . Alcohol use: Yes    Alcohol/week: 0.0 oz    Comment: glass of wine  . Drug use: No  . Sexual activity: Not on file  Lifestyle  . Physical activity:    Days per week: Not on file    Minutes per  session: Not on file  . Stress: Not on file  Relationships  . Social connections:    Talks on phone: Not on file    Gets together: Not on file    Attends religious service: Not on file    Active member of club or organization: Not on file    Attends meetings of clubs or organizations: Not on file    Relationship status: Not on file  . Intimate partner violence:    Fear of current or ex partner: Not on file    Emotionally abused: Not on file    Physically abused: Not on file    Forced sexual activity: Not on file  Other Topics Concern  . 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 [place]      Home 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, Information systems manager and two great grandchildren age 74 and 22      Spiritual Beliefs: Christian      Lifestyle: regular exercise at the [facility]; healthy diet  Past Surgical History:  Procedure Laterality Date  . ABDOMINAL HYSTERECTOMY  1984  . REPLACEMENT TOTAL KNEE  2007   left  . TOTAL HIP ARTHROPLASTY  2010   Dr. Turner Danielsowan, right  . TOTAL KNEE ARTHROPLASTY Right 09/14/2015   Procedure: TOTAL KNEE ARTHROPLASTY;  Surgeon: Dannielle HuhSteve Lucey, MD;  Location: [facility] OR;  Service: Orthopedics;  Laterality: Right;    Family History  Problem Relation Age of Onset  . Diabetes Mother   . Hypertension Father   . Kidney disease Father   . Heart attack Sister   . Heart attack Brother   . Colon cancer Neg Hx   . Stomach cancer Neg Hx   . Esophageal cancer Neg Hx   . Rectal cancer Neg Hx     Allergies  Allergen Reactions  . Codeine Nausea And Vomiting  . Hydrocodone Nausea And Vomiting    Current Outpatient Medications on File Prior to Visit  Medication Sig Dispense Refill  . aspirin EC 81 MG tablet Take 1 tablet (81 mg total) by mouth daily.    . cyanocobalamin 500 MCG tablet Take 500 mcg by mouth daily.    . hydrochlorothiazide (HYDRODIURIL) 25 MG tablet Take 1 tablet (25 mg total) by  mouth daily. 90 tablet 3  . losartan (COZAAR) 100 MG tablet TAKE ONE TABLET BY MOUTH ONCE DAILY 90 tablet 3  . meclizine (ANTIVERT) 25 MG tablet Take 1 tablet (25 mg total) by mouth 3 (three) times daily as needed for dizziness. 15 tablet 0  . metoprolol tartrate (LOPRESSOR) 25 MG tablet Take 1 tablet (25 mg total) by mouth 2 (two) times daily. 180 tablet 3  . Multiple Vitamin (MULTIVITAMIN WITH MINERALS) TABS Take 1 tablet by mouth daily.     . simvastatin (ZOCOR) 40 MG tablet Take 1 tablet (40 mg total) by mouth daily. 90 tablet 3   No current facility-administered medications on file prior to visit.     BP 138/68 (BP Location: Right Arm, Patient Position: Sitting, Cuff Size: Large)   Pulse 64   Temp 98.1 F (36.7 C) (Oral)   Wt 171 lb (77.6 kg)   SpO2 98%   BMI 29.35 kg/m       Review of Systems  Constitutional: Negative.   HENT: Negative for congestion, dental problem, hearing loss, rhinorrhea, sinus pressure, sore throat and tinnitus.   Eyes: Negative for pain, discharge and visual disturbance.  Respiratory: Negative for cough and shortness of breath.   Cardiovascular: Negative for chest pain, palpitations and leg swelling.  Gastrointestinal: Negative for abdominal distention, abdominal pain, blood in stool, constipation, diarrhea, nausea and vomiting.  Genitourinary: Negative for difficulty urinating, dysuria, flank pain, frequency, hematuria, pelvic pain, urgency, vaginal bleeding, vaginal discharge and vaginal pain.  Musculoskeletal: Positive for gait problem. Negative for arthralgias and joint swelling.  Skin: Negative for rash.  Neurological: Positive for dizziness. Negative for syncope, speech difficulty, weakness, numbness and headaches.  Hematological: Negative for adenopathy.  Psychiatric/Behavioral: Negative for agitation, behavioral problems and dysphoric mood. The patient is not nervous/anxious.        Objective:   Physical Exam  Constitutional: She is  oriented to person, place, and time. She appears well-developed and well-nourished. No distress.  HENT:  Wax in the right canal  Eyes: Pupils are equal, round, and reactive to light. EOM are normal.  Neurological: She is alert and oriented to person, place, and time. No sensory deficit. Coordination normal.  Normal finger-to-nose testing  Gait very mildly unsteady Able to  walk without a cane  No drift          Assessment & Plan:   Status post a recent ED visit for suspected benign positional vertigo Clinically stable.  Normal neuro exam Essential hypertension well-controlled  Medications updated Patient will follow-up with PCP as scheduled in October Will report any clinical change  Gordy Savers

## 2018-01-11 ENCOUNTER — Other Ambulatory Visit: Payer: Self-pay | Admitting: Family Medicine

## 2018-01-13 IMAGING — DX DG CHEST 2V
2 series · 2 of 2 positions shown · non-contrast
Comparison: 09/04/2015

CLINICAL DATA: Intermittent left-sided chest pain for 1 week

EXAM:
CHEST  2 VIEW

[chest pa]
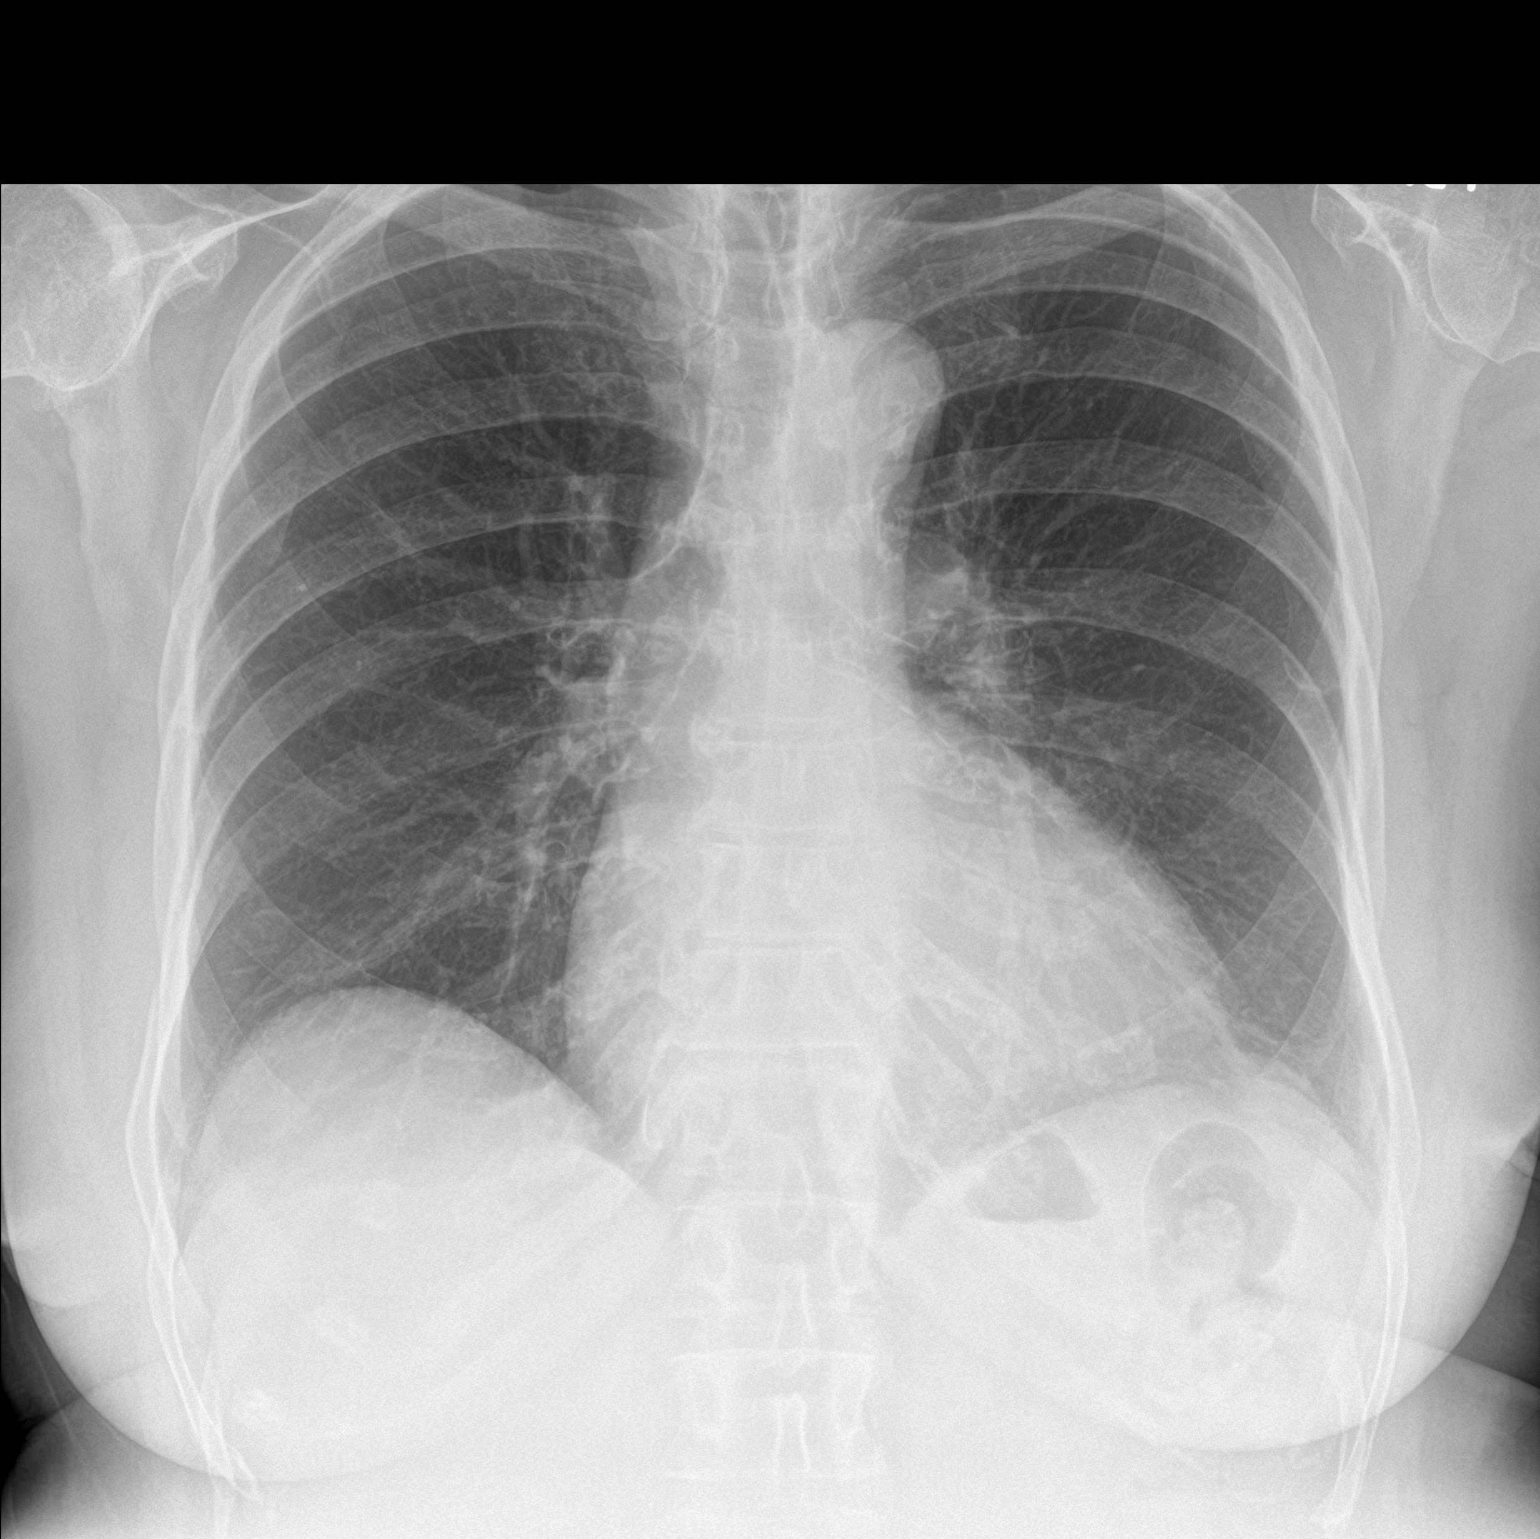

[chest lat]
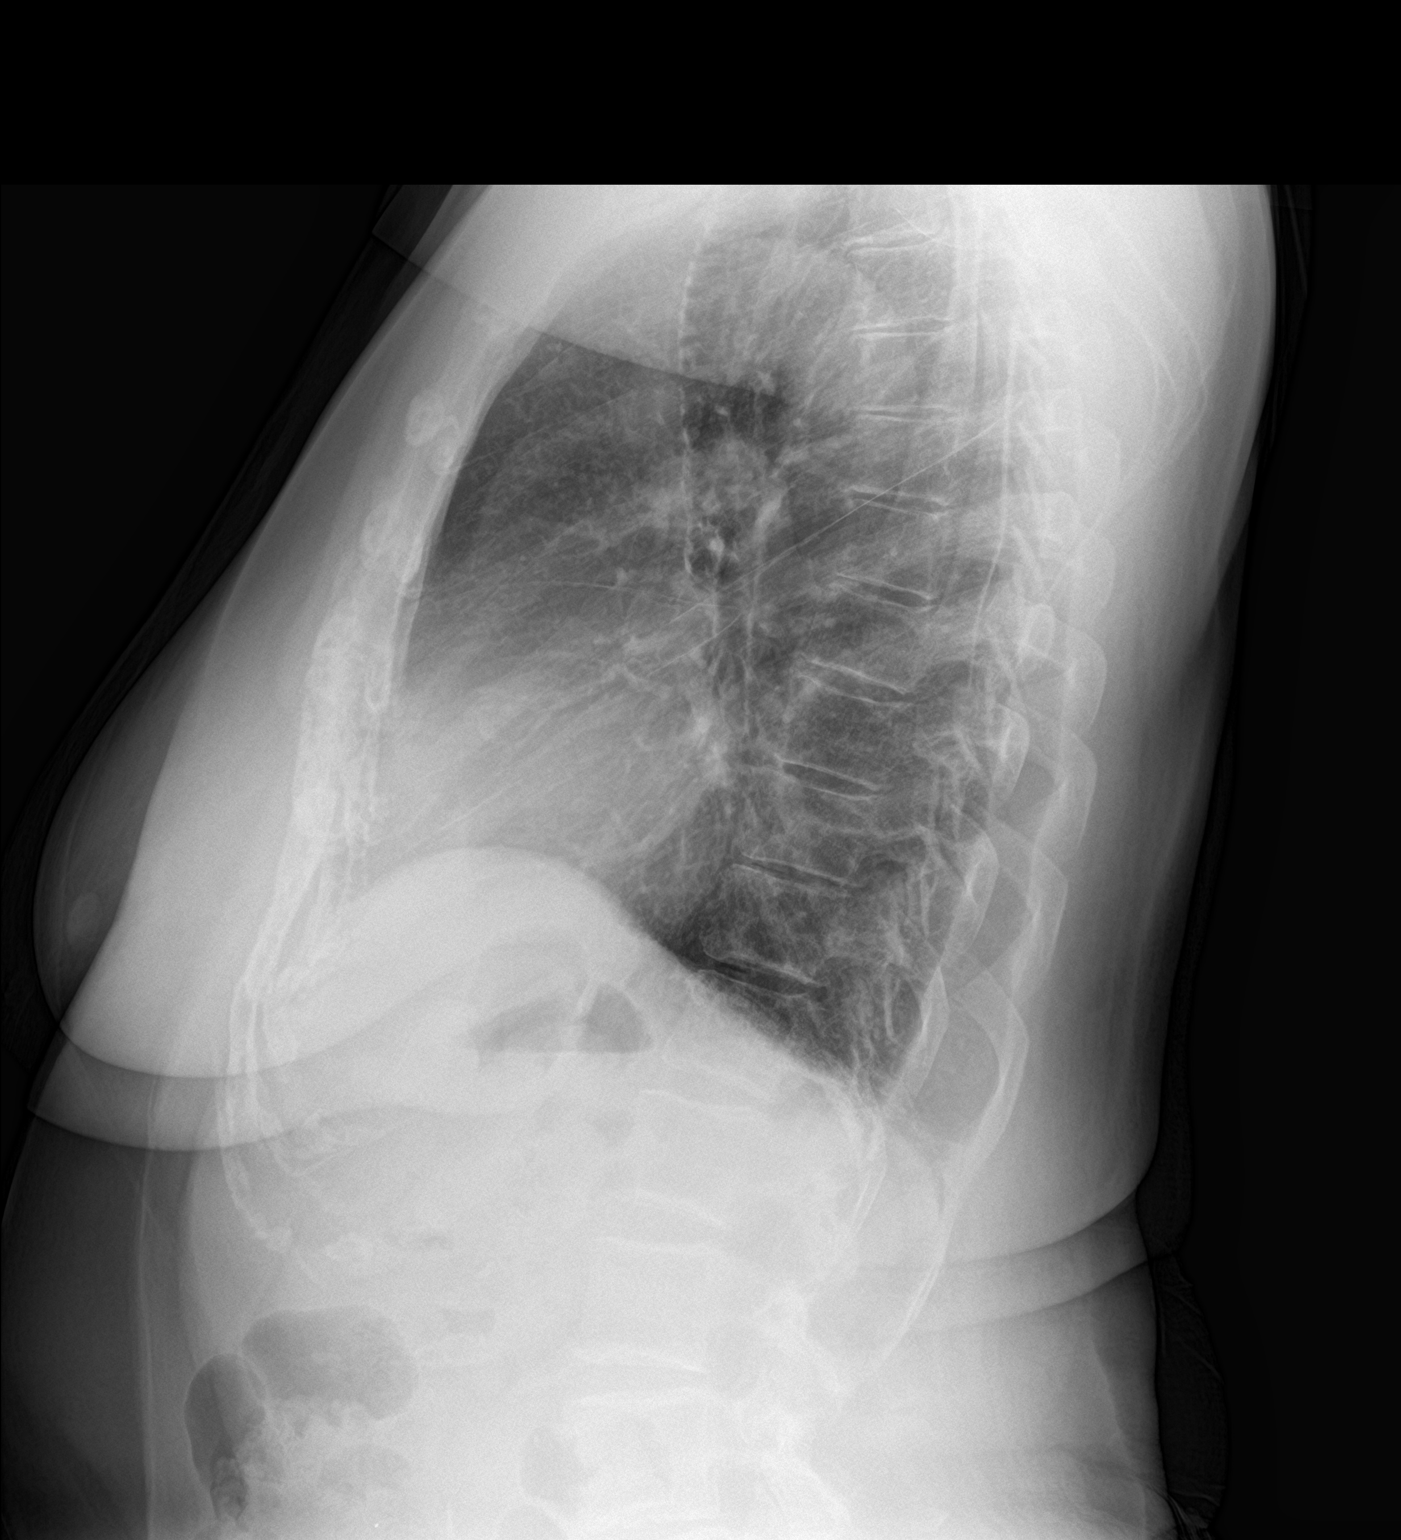

[2 of 2 positions shown; findings below may reference images not displayed]

FINDINGS: Stable mild cardiomegaly. Mild scarring at the left base is also
stable. There is no edema, consolidation, effusion, or pneumothorax.
IMPRESSION: Stable from prior.  No evidence of active disease.

## 2018-01-19 DIAGNOSIS — Z1231 Encounter for screening mammogram for malignant neoplasm of breast: Secondary | ICD-10-CM | POA: Diagnosis not present

## 2018-01-25 ENCOUNTER — Encounter: Payer: Self-pay | Admitting: Family Medicine

## 2018-01-25 ENCOUNTER — Ambulatory Visit (INDEPENDENT_AMBULATORY_CARE_PROVIDER_SITE_OTHER): Payer: Medicare Other | Admitting: Family Medicine

## 2018-01-25 VITALS — BP 118/76 | HR 68 | Temp 98.1°F | Ht 64.0 in | Wt 182.6 lb

## 2018-01-25 DIAGNOSIS — R2689 Other abnormalities of gait and mobility: Secondary | ICD-10-CM

## 2018-01-25 DIAGNOSIS — I10 Essential (primary) hypertension: Secondary | ICD-10-CM | POA: Diagnosis not present

## 2018-01-25 DIAGNOSIS — R42 Dizziness and giddiness: Secondary | ICD-10-CM

## 2018-01-25 DIAGNOSIS — Z23 Encounter for immunization: Secondary | ICD-10-CM | POA: Diagnosis not present

## 2018-01-25 NOTE — Progress Notes (Signed)
HPI:  Using dictation device. Unfortunately this device frequently misinterprets words/phrases.  Acute visit for vertigo: -Per chart had extensive evaluation in the emergency room in August, had MRI and neurology was consulted -Per review of discharge physician notes she was diagnosed with positional peripheral vertigo, meclizine and ear nose and throat follow-up if persistent was recommended -Today reports: had 3 epsiodes of positional induced brief episodes of vertigo in the las month, last instance 4 days ago -Denies: ha, vision changes, weakness, numbness or symptoms between acute spells  Chronic conditions:  HTN/HLD: -sees Dr. Jens Somrenshaw, cardiologist for hx mild CAD, mild carotid art dz -meds: hctz, losartan, metoprolol, simvastatin, asa -denies: CP, SOB, DOE, stain intol  B12 def/Chronic Anemia: -oral b12, iron on and off her whole life per report -colonoscopy 2014 normal -denies bleeding, dizziness, fatigue  ROS: See pertinent positives and negatives per HPI.  Past Medical History:  Diagnosis Date  . Anemia 01/18/2016   -reports on and off her whole life, on oral iron in the past remotely per report  . Arthritis   . B12 deficiency 01/18/2016  . CAD (coronary artery disease)   . CVD (cerebrovascular disease)   . Diverticulosis of colon   . H. pylori infection 10/20/2015  . Hyperlipidemia   . Hypertension   . Osteoarthritis 03/07/2006   Qualifier: Diagnosis of  By: Cato MulliganSwords MD, Bruce      Past Surgical History:  Procedure Laterality Date  . ABDOMINAL HYSTERECTOMY  1984  . REPLACEMENT TOTAL KNEE  2007   left  . TOTAL HIP ARTHROPLASTY  2010   Dr. Turner Danielsowan, right  . TOTAL KNEE ARTHROPLASTY Right 09/14/2015   Procedure: TOTAL KNEE ARTHROPLASTY;  Surgeon: Dannielle HuhSteve Lucey, MD;  Location: [facility] OR;  Service: Orthopedics;  Laterality: Right;    Family History  Problem Relation Age of Onset  . Diabetes Mother   . Hypertension Father   . Kidney disease Father   . Heart attack  Sister   . Heart attack Brother   . Colon cancer Neg Hx   . Stomach cancer Neg Hx   . Esophageal cancer Neg Hx   . Rectal cancer Neg Hx     SOCIAL HX: see hpi   Current Outpatient Medications:  .  aspirin EC 81 MG tablet, Take 1 tablet (81 mg total) by mouth daily., Disp: , Rfl:  .  cyanocobalamin 500 MCG tablet, Take 500 mcg by mouth daily., Disp: , Rfl:  .  dimenhyDRINATE (DRAMAMINE PO), Take by mouth. As needed for vertigo, Disp: , Rfl:  .  hydrochlorothiazide (HYDRODIURIL) 25 MG tablet, TAKE ONE TABLET BY MOUTH ONCE DAILY, Disp: 90 tablet, Rfl: 3 .  losartan (COZAAR) 100 MG tablet, TAKE ONE TABLET BY MOUTH ONCE DAILY, Disp: 90 tablet, Rfl: 3 .  metoprolol tartrate (LOPRESSOR) 25 MG tablet, Take 1 tablet (25 mg total) by mouth 2 (two) times daily., Disp: 180 tablet, Rfl: 3 .  Multiple Vitamin (MULTIVITAMIN WITH MINERALS) TABS, Take 1 tablet by mouth daily. , Disp: , Rfl:  .  simvastatin (ZOCOR) 40 MG tablet, Take 1 tablet (40 mg total) by mouth daily., Disp: 90 tablet, Rfl: 3  EXAM:  Vitals:   01/25/18 1620  BP: 118/76  Pulse: 68  Temp: 98.1 F (36.7 C)  SpO2: 98%    Body mass index is 31.34 kg/m.  GENERAL: vitals reviewed and listed above, alert, oriented, appears well hydrated and in no acute distress  HEENT: atraumatic, conjunttiva clear, no obvious abnormalities on inspection of external nose  and ears  NECK: no obvious masses on inspection  LUNGS: clear to auscultation bilaterally, no wheezes, rales or rhonchi, good air movement  CV: HRRR, no peripheral edema  MS: moves all extremities without noticeable abnormality  PSYCH/NEURO: pleasant and cooperative, no obvious depression or anxiety, CN II-XII grossly intact, speech and thought processing grossly intact, finger to nose normal, dix hallpike neg  ASSESSMENT AND PLAN:  Discussed the following assessment and plan:  Vertigo - Plan: Ambulatory referral to Physical Therapy  Balance problem - Plan:  Ambulatory referral to Physical Therapy  Essential hypertension  Need for immunization against influenza - Plan: Flu vaccine HIGH DOSE PF (Fluzone High dose)  -we discussed possible serious and likely etiologies, workup and treatment, treatment risks and return precautions -after this discussion, Felicia Cross opted for referral to PT - I suggested they try home exercises if occurs again since currently asymptomatic, but she would prefer to see PT and do some balance PT (she denies any balance issues between episodes)  -follow up advised as scheduled -she wanted to get her flu shot today -of course, we advised Felicia Cross  to return or notify a doctor immediately if symptoms worsen or persist or new concerns arise.   Patient Instructions  BEFORE YOU LEAVE: -flu shot -follow up: as scheduled  -We placed a referral for you as discussed. It usually takes about 1-2 weeks to process and schedule this referral. If you have not heard from [place] regarding this appointment in 2 weeks please contact our office.  Please see the link below for more information and a treatment for vertigo.  [email or web address]  Please do not drive with vertigo.  I hope you are feeling better soon! Seek care promptly if your symptoms worsen, new concerns arise or you are not improving with treatment.      Felicia Koyanagi, DO

## 2018-01-25 NOTE — Patient Instructions (Signed)
BEFORE YOU LEAVE: -flu shot -follow up: as scheduled  -We placed a referral for you as discussed. It usually takes about 1-2 weeks to process and schedule this referral. If you have not heard from us regarding this appointment in 2 weeks please contact our office.  Please see the link below for more information and a treatment for vertigo.  [email or web address]  Please do not drive with vertigo.  I hope you are feeling better soon! Seek care promptly if your symptoms worsen, new concerns arise or you are not improving with treatment.

## 2018-02-19 NOTE — Progress Notes (Signed)
HPI:  Using dictation device. Unfortunately this device frequently misinterprets words/phrases.  Felicia Cross is a pleasant 84 y.o. here for follow up. Chronic medical problems summarized below were reviewed for changes and stability and were updated as needed below. These issues and their treatment remain stable for the most part.  She has had some intermittent vertigo.  This will come out of the blue, sometimes after several weeks of no symptoms at all.  Precipitated by certain movements, burning sensation.  She does not drive with vertigo.  She will get be getting vestibular rehab for this soon.  No symptoms today. denies headache, palpitations, weakness CP, SOB, DOE, treatment intolerance or new symptoms.  AWV 11/2017  Vertigo: -started August 2019, s/p eval with MRI and neuro consult in ER, dx with positional vertigo -referred for vestibular rehab 01/2018  HTN/HLD: -sees Dr. Jens Som, cardiologist for hx mild CAD, mild carotid art dz -meds: hctz, losartan, metoprolol, simvastatin, asa -denies: CP, SOB, DOE, stain intol  B12 def/Chronic Anemia: -oral b12, iron on and off her whole life per report -colonoscopy 2014 normal -denies bleeding, dizziness, fatigue    ROS: See pertinent positives and negatives per HPI.  Past Medical History:  Diagnosis Date  . Anemia 01/18/2016   -reports on and off her whole life, on oral iron in the past remotely per report  . Arthritis   . B12 deficiency 01/18/2016  . CAD (coronary artery disease)   . CVD (cerebrovascular disease)   . Diverticulosis of colon   . H. pylori infection 10/20/2015  . Hyperlipidemia   . Hypertension   . Osteoarthritis 03/07/2006   Qualifier: Diagnosis of  By: Cato Mulligan MD, Bruce      Past Surgical History:  Procedure Laterality Date  . ABDOMINAL HYSTERECTOMY  1984  . REPLACEMENT TOTAL KNEE  2007   left  . TOTAL HIP ARTHROPLASTY  2010   Dr. Turner Daniels, right  . TOTAL KNEE ARTHROPLASTY Right 09/14/2015   Procedure:  TOTAL KNEE ARTHROPLASTY;  Surgeon: Dannielle Huh, MD;  Location: [facility] OR;  Service: Orthopedics;  Laterality: Right;    Family History  Problem Relation Age of Onset  . Diabetes Mother   . Hypertension Father   . Kidney disease Father   . Heart attack Sister   . Heart attack Brother   . Colon cancer Neg Hx   . Stomach cancer Neg Hx   . Esophageal cancer Neg Hx   . Rectal cancer Neg Hx     SOCIAL HX: see hpi   Current Outpatient Medications:  .  aspirin EC 81 MG tablet, Take 1 tablet (81 mg total) by mouth daily., Disp: , Rfl:  .  cyanocobalamin 500 MCG tablet, Take 500 mcg by mouth daily., Disp: , Rfl:  .  dimenhyDRINATE (DRAMAMINE PO), Take by mouth. As needed for vertigo, Disp: , Rfl:  .  hydrochlorothiazide (HYDRODIURIL) 25 MG tablet, TAKE ONE TABLET BY MOUTH ONCE DAILY, Disp: 90 tablet, Rfl: 3 .  losartan (COZAAR) 100 MG tablet, TAKE ONE TABLET BY MOUTH ONCE DAILY, Disp: 90 tablet, Rfl: 3 .  metoprolol tartrate (LOPRESSOR) 25 MG tablet, Take 1 tablet (25 mg total) by mouth 2 (two) times daily., Disp: 180 tablet, Rfl: 3 .  Multiple Vitamin (MULTIVITAMIN WITH MINERALS) TABS, Take 1 tablet by mouth daily. , Disp: , Rfl:  .  simvastatin (ZOCOR) 40 MG tablet, Take 1 tablet (40 mg total) by mouth daily., Disp: 90 tablet, Rfl: 3  EXAM:  Vitals:   02/20/18 0941  BP: 120/78  Pulse: 61  Temp: 98.2 F (36.8 C)    Body mass index is 32 kg/m.  GENERAL: vitals reviewed and listed above, alert, oriented, appears well hydrated and in no acute distress  HEENT: atraumatic, conjunttiva clear, no obvious abnormalities on inspection of external nose and ears  NECK: no obvious masses on inspection  LUNGS: clear to auscultation bilaterally, no wheezes, rales or rhonchi, good air movement  CV: HRRR, no peripheral edema  MS: moves all extremities without noticeable abnormality  PSYCH: pleasant and cooperative, no obvious depression or anxiety  ASSESSMENT AND PLAN:  Discussed the  following assessment and plan:  Vertigo  B12 deficiency  Hyperlipidemia, unspecified hyperlipidemia type  Atherosclerosis of native coronary artery of native heart without angina pectoris  Essential hypertension  Carotid artery disease, unspecified laterality, unspecified type (HCC)  -Vestibular rehab for likely positional vertigo, reevaluation with neurology if persist or any change or worsening -To new current medications -Blood pressure very mildly elevated on the diastolic number, opted to stick with her current medications and monitor -Healthy lifestyle advised -Labs next visit -Patient advised to return or notify a doctor immediately if symptoms worsen or persist or new concerns arise.  Patient Instructions  BEFORE YOU LEAVE: -follow up: 3-4 months  I hope that the therapy helps the vertigo. Please let us know if not and we will have you see the neurologist.  Do not drive with vertigo.   We recommend the following healthy lifestyle for LIFE: 1) Small portions. But, make sure to get regular (at least 3 per day), healthy meals and small healthy snacks if needed.  2) Eat a healthy clean diet.   TRY TO EAT: -at least 5-7 servings of low sugar, colorful, and nutrient rich vegetables per day (not corn, potatoes or bananas.) -berries are the best choice if you wish to eat fruit (only eat small amounts if trying to reduce weight)  -lean meets (fish, white meat of chicken or [place]) -vegan proteins for some meals - beans or tofu, whole grains, nuts and seeds -Replace bad fats with good fats - good fats include: fish, nuts and seeds, canola oil, olive oil -small amounts of low fat or non fat dairy -small amounts of100 % whole grains - check the lables -drink plenty of water  AVOID: -SUGAR, sweets, anything with added sugar, corn syrup or sweeteners - must read labels as even foods advertised as "healthy" often are loaded with sugar -if you must have a sweetener, small  amounts of stevia may be best -sweetened beverages and artificially sweetened beverages -simple starches (rice, bread, potatoes, pasta, chips, etc - small amounts of 100% whole grains are ok) -red meat, pork, butter -fried foods, fast food, processed food, excessive dairy, eggs and coconut.  3)Get at least 150 minutes of sweaty aerobic exercise per week.  4)Reduce stress - consider counseling, meditation and relaxation to balance other aspects of your life.     Terressa Koyanagi, DO

## 2018-02-20 ENCOUNTER — Ambulatory Visit (INDEPENDENT_AMBULATORY_CARE_PROVIDER_SITE_OTHER): Payer: Medicare Other | Admitting: Family Medicine

## 2018-02-20 ENCOUNTER — Encounter: Payer: Self-pay | Admitting: Family Medicine

## 2018-02-20 VITALS — BP 120/78 | HR 61 | Temp 98.2°F | Ht 64.0 in | Wt 186.4 lb

## 2018-02-20 DIAGNOSIS — E785 Hyperlipidemia, unspecified: Secondary | ICD-10-CM | POA: Diagnosis not present

## 2018-02-20 DIAGNOSIS — I10 Essential (primary) hypertension: Secondary | ICD-10-CM

## 2018-02-20 DIAGNOSIS — I2510 Atherosclerotic heart disease of native coronary artery without angina pectoris: Secondary | ICD-10-CM

## 2018-02-20 DIAGNOSIS — E538 Deficiency of other specified B group vitamins: Secondary | ICD-10-CM

## 2018-02-20 DIAGNOSIS — R42 Dizziness and giddiness: Secondary | ICD-10-CM

## 2018-02-20 DIAGNOSIS — I779 Disorder of arteries and arterioles, unspecified: Secondary | ICD-10-CM

## 2018-02-20 DIAGNOSIS — I739 Peripheral vascular disease, unspecified: Secondary | ICD-10-CM

## 2018-02-20 NOTE — Patient Instructions (Signed)
BEFORE YOU LEAVE: -follow up: 3-4 months  I hope that the therapy helps the vertigo. Please let us know if not and we will have you see the neurologist.  Do not drive with vertigo.   We recommend the following healthy lifestyle for LIFE: 1) Small portions. But, make sure to get regular (at least 3 per day), healthy meals and small healthy snacks if needed.  2) Eat a healthy clean diet.   TRY TO EAT: -at least 5-7 servings of low sugar, colorful, and nutrient rich vegetables per day (not corn, potatoes or bananas.) -berries are the best choice if you wish to eat fruit (only eat small amounts if trying to reduce weight)  -lean meets (fish, white meat of chicken or [place]) -vegan proteins for some meals - beans or tofu, whole grains, nuts and seeds -Replace bad fats with good fats - good fats include: fish, nuts and seeds, canola oil, olive oil -small amounts of low fat or non fat dairy -small amounts of100 % whole grains - check the lables -drink plenty of water  AVOID: -SUGAR, sweets, anything with added sugar, corn syrup or sweeteners - must read labels as even foods advertised as "healthy" often are loaded with sugar -if you must have a sweetener, small amounts of stevia may be best -sweetened beverages and artificially sweetened beverages -simple starches (rice, bread, potatoes, pasta, chips, etc - small amounts of 100% whole grains are ok) -red meat, pork, butter -fried foods, fast food, processed food, excessive dairy, eggs and coconut.  3)Get at least 150 minutes of sweaty aerobic exercise per week.  4)Reduce stress - consider counseling, meditation and relaxation to balance other aspects of your life.

## 2018-03-01 ENCOUNTER — Ambulatory Visit: Payer: Medicare Other | Attending: Family Medicine

## 2018-03-01 ENCOUNTER — Other Ambulatory Visit: Payer: Self-pay

## 2018-03-01 DIAGNOSIS — R42 Dizziness and giddiness: Secondary | ICD-10-CM | POA: Insufficient documentation

## 2018-03-01 DIAGNOSIS — R2689 Other abnormalities of gait and mobility: Secondary | ICD-10-CM | POA: Diagnosis not present

## 2018-03-01 DIAGNOSIS — R2681 Unsteadiness on feet: Secondary | ICD-10-CM | POA: Insufficient documentation

## 2018-03-01 NOTE — Therapy (Signed)
[facility] [address] [place], [place], [address] Phone: [phone]   Fax:  [phone]  Physical Therapy Evaluation  Patient Details  Name: Felicia Cross MRN: [id number] Date of Birth: [birth year] Referring Provider (PT): Dr. Maudie Mercury   Encounter Date: 03/01/2018  PT End of Session - 03/01/18 1538    Visit Number  1    Number of Visits  9    Date for PT Re-Evaluation  04/30/18    Authorization Type  [employer] Medicare    PT Start Time  1401    PT Stop Time  1445    PT Time Calculation (min)  44 min    Equipment Utilized During Treatment  --   min guard to S for safety   Activity Tolerance  Patient tolerated treatment well    Behavior During Therapy  [facility] for tasks assessed/performed       Past Medical History:  Diagnosis Date  . Anemia 01/18/2016   -reports on and off her whole life, on oral iron in the past remotely per report  . Arthritis   . B12 deficiency 01/18/2016  . CAD (coronary artery disease)   . CVD (cerebrovascular disease)   . Diverticulosis of colon   . H. pylori infection 10/20/2015  . Hyperlipidemia   . Hypertension   . Osteoarthritis 03/07/2006   Qualifier: Diagnosis of  By: Leanne Chang MD, Bruce      Past Surgical History:  Procedure Laterality Date  . ABDOMINAL HYSTERECTOMY  1984  . REPLACEMENT TOTAL KNEE  2007   left  . TOTAL HIP ARTHROPLASTY  2010   Dr. Mayer Camel, right  . TOTAL KNEE ARTHROPLASTY Right 09/14/2015   Procedure: TOTAL KNEE ARTHROPLASTY;  Surgeon: Vickey Huger, MD;  Location: [facility];  Service: Orthopedics;  Laterality: Right;    There were no vitals filed for this visit.   Subjective Assessment - 03/01/18 1409    Subjective  Pt reported dizziness began suddenly two months ago, she feels dizzy, off balance, blurred vision, and lethargic. She was diagnosed at the ED with vertigo. She states it shows up suddenly. It has only occurred once when driving but typically doesn't follow a  pattern. Dizziness lasts seconds to minutes, at worst dizziness 4-5/10, at best: 0/10. It is about the same since onset. Dramamine helps dizziness. Pt denied HA, LOH, tinnitus, N/T or weakness with dizziness. Pt does have intermittent R hand N/T when reading books but it is not associated with dizziness.     Pertinent History  HTN, CVD, CAD, coronary atherosclerosis, OA, hyperlipemia, anemia, hx of B TKA and L THA, Vitamin B12 deficiency    Patient Stated Goals  Improve dizziness and balance.     Currently in Pain?  No/denies         [facility] PT Assessment - 03/01/18 1415      Assessment   Medical Diagnosis  Vertigo, balance problem    Referring Provider (PT)  Dr. Maudie Mercury    Onset Date/Surgical Date  12/30/17    Hand Dominance  Right    Prior Therapy  none      Precautions   Precautions  None      Restrictions   Weight Bearing Restrictions  No      Balance Screen   Has the patient fallen in the past 6 months  Yes    How many times?  1   3 weeks ago, getting out of car in driveway   Has the patient had  a decrease in activity level because of a fear of falling?   No    Is the patient reluctant to leave their home because of a fear of falling?   No      Home Film/video editor residence    Living Arrangements  Spouse/significant other    Available Help at Discharge  Family    Type of [place] to enter    Entrance Stairs-Number of Steps  2    Entrance Stairs-Rails  None    Home Layout  Two level;Bed/bath upstairs    Alternate Level Stairs-Number of Steps  14    Alternate Level Stairs-Rails  Right    [place] - single point      Prior Function   Level of Independence  Independent    Vocation  Retired    Leisure  Go to [employer], book club, Bible study on Monday nights, hang out with family-travel to [place] and [place] to visit dtrs      Cognition   Overall Cognitive Status  Within Functional Limits for tasks assessed       Ambulation/Gait   Ambulation/Gait  Yes    Ambulation/Gait Assistance  5: Supervision    Ambulation/Gait Assistance Details  S for safety, uses SPC for longer distances and at church. Pt amb. in guarded manner 2/2 fear of provoking dizziness.     Ambulation Distance (Feet)  100 Feet    Assistive device  None    Gait Pattern  Step-through pattern;Decreased stride length;Decreased trunk rotation    Ambulation Surface  Level;Indoor    Gait velocity  2.19f/sec. no AD      Functional Gait  Assessment   Gait assessed   Yes    Gait Level Surface  Walks 20 ft, slow speed, abnormal gait pattern, evidence for imbalance or deviates 10-15 in outside of the 12 in walkway width. Requires more than 7 sec to ambulate 20 ft.   9.08 sec.    Change in Gait Speed  Able to change speed, demonstrates mild gait deviations, deviates 6-10 in outside of the 12 in walkway width, or no gait deviations, unable to achieve a major change in velocity, or uses a change in velocity, or uses an assistive device.    Gait with Horizontal Head Turns  Performs head turns smoothly with slight change in gait velocity (eg, minor disruption to smooth gait path), deviates 6-10 in outside 12 in walkway width, or uses an assistive device.    Gait with Vertical Head Turns  Performs task with slight change in gait velocity (eg, minor disruption to smooth gait path), deviates 6 - 10 in outside 12 in walkway width or uses assistive device    Gait and Pivot Turn  Pivot turns safely in greater than 3 sec and stops with no loss of balance, or pivot turns safely within 3 sec and stops with mild imbalance, requires small steps to catch balance.    Step Over Obstacle  Is able to step over one shoe box (4.5 in total height) but must slow down and adjust steps to clear box safely. May require verbal cueing.    Gait with Narrow Base of Support  Ambulates less than 4 steps heel to toe or cannot perform without assistance.    Gait with Eyes Closed  Walks 20  ft, slow speed, abnormal gait pattern, evidence for imbalance, deviates 10-15 in outside 12 in  walkway width. Requires more than 9 sec to ambulate 20 ft.    Ambulating Backwards  Walks 20 ft, slow speed, abnormal gait pattern, evidence for imbalance, deviates 10-15 in outside 12 in walkway width.    Steps  Two feet to a stair, must use rail.   two feet to descend   Total Score  13    FGA comment:  13/30: indicates pt is at high risk for falls.            Vestibular Assessment - 03/01/18 1425      Symptom Behavior   Type of Dizziness  Comment   woozy   Frequency of Dizziness  Every 2-3 weeks    Duration of Dizziness  Seconds to minutes    Aggravating Factors  No known aggravating factors    Relieving Factors  Slow movements;Rest      Occulomotor Exam   Occulomotor Alignment  Normal    Spontaneous  Absent    Gaze-induced  Absent    Smooth Pursuits  Intact   pt reported "spots" during L saccades   Saccades  Intact    Comment  Pt denied dizziness but reported "spots" during L saccades, and when she doesn't wear glasses.  B HIT (-).      Vestibulo-Occular Reflex   VOR 1 Head Only (x 1 viewing)  WNL with no dizziness reported    VOR Cancellation  Normal      Positional Testing   Dix-Hallpike  Dix-Hallpike Right;Dix-Hallpike Left    Horizontal Canal Testing  Horizontal Canal Right;Horizontal Canal Left      Dix-Hallpike Right   Dix-Hallpike Right Duration  0    Dix-Hallpike Right Symptoms  No nystagmus      Dix-Hallpike Left   Dix-Hallpike Left Duration  0    Dix-Hallpike Left Symptoms  No nystagmus      Horizontal Canal Right   Horizontal Canal Right Duration  0    Horizontal Canal Right Symptoms  Normal      Horizontal Canal Left   Horizontal Canal Left Duration  0    Horizontal Canal Left Symptoms  Normal      Positional Sensitivities   Sit to Supine  No dizziness    Up from Right Hallpike  No dizziness    Up from Left Hallpike  No dizziness           Objective measurements completed on examination: See above findings.              PT Education - 03/01/18 1538    Education Details  PT discussed exam findings, outcome measure results, PT POC, frequency and duration.     Person(s) Educated  Patient    Methods  Explanation       PT Short Term Goals - 03/01/18 1606      PT SHORT TERM GOAL #1   Title  same as LTGs        PT Long Term Goals - 03/01/18 1606      PT LONG TERM GOAL #1   Title  Pt will be IND in HEP to improve balance, dizziness, and strength. TARGET DATE FOR ALL LTGS: 03/29/18    Status  New      PT LONG TERM GOAL #2   Title  Pt will improve gait speed to >/=2.53f/sec, no AD, to safely amb. in the community.     Baseline  2.369fsec no AD    Status  New  PT LONG TERM GOAL #3   Title  Pt will improve FGA score to >/=21/30 to decr. falls risk.     Baseline  13/30    Status  New      PT LONG TERM GOAL #4   Title  Pt will report dizziness at worst is </=2/10 to improve QOL and safety during functional mobility.     Baseline  4-5/10 at worst    Status  New      PT LONG TERM GOAL #5   Title  Pt will amb. 600' over even/uneven terrain, IND, to improve functional mobility.     Status  New             Plan - 03/01/18 1545    Clinical Impression Statement  Pt is a pleasant 84y/o female presenting to [facility] neuro for vertigo and balance impairments. Pt's PMH is significant for the following: HTN, CVD, CAD, coronary atherosclerosis, OA, hyperlipemia, anemia, hx of B TKA and L THA, Vitamin B12 deficiency. PT was unable to provoke dizziness during vestibular exam. However, pt reported she sees spots intermittently without glassess and PT encouraged pt to f/u with MD as this could be more vascular in nature. The following impairments were noted upon exam: gait deviations, intermittent dizziness (per pt), impaired strength based on pt's gait deviations, and impaired balance. Pt's gait speed  indicates pt is unable to safely amb. in the community. Pt's FGA score indicates pt is at a high risk for falls. PT will continue to monitor dizziness and treat as indicated or refer back to MD. Pt would benefit from skilled PT to improve safety during functional mobility.     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     PT Frequency  2x / week    PT Duration  4 weeks    PT Treatment/Interventions  ADLs/Self Care Home Management;Canalith Repostioning;Biofeedback;DME Instruction;Gait training;Stair training;Functional mobility training;Therapeutic activities;Therapeutic exercise;Manual techniques;Vestibular;Patient/family education;Neuromuscular re-education;Balance training    PT Next Visit Plan  Assess for orthostatics and formally assess strength, provide balance and LE/hip strengthening HEP.    Consulted and Agree with Plan of Care  Patient       Patient will benefit from skilled therapeutic intervention in order to improve the following deficits and impairments:  Abnormal gait, Dizziness, Decreased balance, Decreased mobility, Decreased knowledge of use of DME, Decreased strength  Visit Diagnosis: Other abnormalities of gait and mobility - Plan: PT plan of care cert/re-cert  Dizziness and giddiness - Plan: PT plan of care cert/re-cert  Unsteadiness on feet - Plan: PT plan of care cert/re-cert     Problem List Patient Active Problem List   Diagnosis Date Noted  . B12 deficiency 01/18/2016  . Anemia 01/18/2016  . S/P total knee  replacement 09/14/2015  . Carotid arterial disease ([facility]) 05/27/2009  . Hyperlipemia 06/27/2008  . Coronary atherosclerosis 11/06/2006  . Essential hypertension 03/07/2006  . Osteoarthritis 03/07/2006    Chisom Muntean L 03/01/2018, 4:10 PM  [facility] [address] [place], [place], [address] Phone: [phone]   Fax:  [phone]  Name: Felicia Cross MRN: [id number] Date of Birth: [birth year]  Geoffry Paradise, PT,DPT 03/01/18 4:10 PM Phone: [phone] Fax: [phone]

## 2018-03-05 ENCOUNTER — Encounter: Payer: Self-pay | Admitting: Rehabilitation

## 2018-03-05 ENCOUNTER — Ambulatory Visit: Payer: Medicare Other | Admitting: Rehabilitation

## 2018-03-05 VITALS — BP 123/79

## 2018-03-05 DIAGNOSIS — R2689 Other abnormalities of gait and mobility: Secondary | ICD-10-CM | POA: Diagnosis not present

## 2018-03-05 DIAGNOSIS — R42 Dizziness and giddiness: Secondary | ICD-10-CM

## 2018-03-05 DIAGNOSIS — R2681 Unsteadiness on feet: Secondary | ICD-10-CM | POA: Diagnosis not present

## 2018-03-05 NOTE — Patient Instructions (Signed)
Access Code: [email or web address]  URL: [email or web address]  Date: 03/05/2018  Prepared by: Harriet Butte   Exercises  Sidelying Diagonal Hip Abduction - 10 reps - 1 sets - 2x daily - 7x weekly  Forward Backward March - 4 reps - 1 sets - 2x daily - 7x weekly  Tandem Stance - 3 reps - 1 sets - 15 hold - 1x daily - 7x weekly  Romberg Stance Eyes Closed on Foam Pad - 3 reps - 1 sets - 20 hold - 2x daily - 7x weekly  Romberg Stance on Foam Pad with Head Rotation - 10 reps - 1 sets - 1-2x daily - 7x weekly  Romberg Stance with Head Nods on Foam Pad - 10 reps - 1 sets - 1-2x daily - 7x weekly

## 2018-03-05 NOTE — Therapy (Signed)
[facility] [address] [place], [place], [address] Phone: [phone]   Fax:  [phone]  Physical Therapy Treatment  Patient Details  Name: GENIEVE RAMASWAMY MRN: [id number] Date of Birth: 07-Jul-1932 Referring Provider (PT): Dr. Selena Batten   Encounter Date: 03/05/2018  PT End of Session - 03/05/18 1204    Visit Number  2    Number of Visits  9    Date for PT Re-Evaluation  04/30/18    Authorization Type  UHC Medicare    PT Start Time  1015    PT Stop Time  1102    PT Time Calculation (min)  47 min    Equipment Utilized During Treatment  --   min guard to S for safety   Activity Tolerance  Patient tolerated treatment well    Behavior During Therapy  [facility] for tasks assessed/performed       Past Medical History:  Diagnosis Date  . Anemia 01/18/2016   -reports on and off her whole life, on oral iron in the past remotely per report  . Arthritis   . B12 deficiency 01/18/2016  . CAD (coronary artery disease)   . CVD (cerebrovascular disease)   . Diverticulosis of colon   . H. pylori infection 10/20/2015  . Hyperlipidemia   . Hypertension   . Osteoarthritis 03/07/2006   Qualifier: Diagnosis of  By: Cato Mulligan MD, Bruce      Past Surgical History:  Procedure Laterality Date  . ABDOMINAL HYSTERECTOMY  1984  . REPLACEMENT TOTAL KNEE  2007   left  . TOTAL HIP ARTHROPLASTY  2010   Dr. Turner Daniels, right  . TOTAL KNEE ARTHROPLASTY Right 09/14/2015   Procedure: TOTAL KNEE ARTHROPLASTY;  Surgeon: Dannielle Huh, MD;  Location: [facility];  Service: Orthopedics;  Laterality: Right;    Vitals:   03/05/18 1022 03/05/18 1028 03/05/18 1030  BP: (!) 145/80 133/75 123/79    Subjective Assessment - 03/05/18 1017    Subjective  Pt reports no dizziness today.      Pertinent History  HTN, CVD, CAD, coronary atherosclerosis, OA, hyperlipemia, anemia, hx of B TKA and L THA, Vitamin B12 deficiency    Patient Stated Goals  Improve dizziness and  balance.     Currently in Pain?  No/denies         [facility] PT Assessment - 03/05/18 1033      ROM / Strength   AROM / PROM / Strength  Strength      Strength   Overall Strength  Deficits    Overall Strength Comments  Seated MMT: B hip flex 3+/5, B hip abd 4/5, B hip add 4/5, B knee ext 5/5, B knee flex 4/5, R ankle DF 4/5, L ankle DF 3+/5                   [facility] Adult PT Treatment/Exercise - 03/05/18 1156      High Level Balance   High Level Balance Comments  Initiated HEP for balance.  See pt instruction.       Self-Care   Self-Care  Other Self-Care Comments    Other Self-Care Comments   Educated on how vestibular system hypofunction can cause balance deficits.  Also discussed BP readings and classification for orthostatic hypotension.       Exercises   Exercises  Other Exercises    Other Exercises   Provided initial HEP for strengthening/balance, see pt instruction  Access Code: [email or web address]  URL: [email or web address]  Date: 03/05/2018  Prepared by: Harriet Butte   Exercises  Sidelying Diagonal Hip Abduction - 10 reps - 1 sets - 2x daily - 7x weekly  Forward Backward March - 4 reps - 1 sets - 2x daily - 7x weekly  Tandem Stance - 3 reps - 1 sets - 15 hold - 1x daily - 7x weekly  Romberg Stance Eyes Closed on Foam Pad - 3 reps - 1 sets - 20 hold - 2x daily - 7x weekly  Romberg Stance on Foam Pad with Head Rotation - 10 reps - 1 sets - 1-2x daily - 7x weekly  Romberg Stance with Head Nods on Foam Pad - 10 reps - 1 sets - 1-2x daily - 7x weekly     PT Education - 03/05/18 1200    Education Details  see self care, HEP for balance/strength    Person(s) Educated  Patient    Methods  Explanation;Demonstration;Handout    Comprehension  Verbalized understanding;Returned demonstration       PT Short Term Goals - 03/01/18 1606      PT SHORT TERM GOAL #1   Title  same as LTGs        PT Long Term Goals - 03/01/18 1606      PT LONG  TERM GOAL #1   Title  Pt will be IND in HEP to improve balance, dizziness, and strength. TARGET DATE FOR ALL LTGS: 03/29/18    Status  New      PT LONG TERM GOAL #2   Title  Pt will improve gait speed to >/=2.51ft/sec, no AD, to safely amb. in the community.     Baseline  2.73ft/sec no AD    Status  New      PT LONG TERM GOAL #3   Title  Pt will improve FGA score to >/=21/30 to decr. falls risk.     Baseline  13/30    Status  New      PT LONG TERM GOAL #4   Title  Pt will report dizziness at worst is </=2/10 to improve QOL and safety during functional mobility.     Baseline  4-5/10 at worst    Status  New      PT LONG TERM GOAL #5   Title  Pt will amb. 600' over even/uneven terrain, IND, to improve functional mobility.     Status  New            Plan - 03/05/18 1204    Clinical Impression Statement  Skilled session focused on assessing for orthostatic hypotension.  See vitals section.  Pt did not have enough of a drop between positions, however from supine to standing did have significant drop, however pt asymmptomatic throughout session.  Assessed LE strength with pt demonstrating B hip weakness (R>L), therefore initiated LE strength and balance HEP, see pt instruction for details.     Rehab Potential  Good    Clinical Impairments Affecting Rehab Potential  see above.     PT Frequency  2x / week    PT Duration  4 weeks    PT Treatment/Interventions  ADLs/Self Care Home Management;Canalith Repostioning;Biofeedback;DME Instruction;Gait training;Stair training;Functional mobility training;Therapeutic activities;Therapeutic exercise;Manual techniques;Vestibular;Patient/family education;Neuromuscular re-education;Balance training    PT Next Visit Plan  Continue to try to provoke dizziness with positional changes?, high level balance and strengthening.     Consulted and Agree with Plan of Care  Patient  Patient will benefit from skilled therapeutic intervention in order to  improve the following deficits and impairments:  Abnormal gait, Dizziness, Decreased balance, Decreased mobility, Decreased knowledge of use of DME, Decreased strength  Visit Diagnosis: Other abnormalities of gait and mobility  Dizziness and giddiness  Unsteadiness on feet     Problem List Patient Active Problem List   Diagnosis Date Noted  . B12 deficiency 01/18/2016  . Anemia 01/18/2016  . S/P total knee replacement 09/14/2015  . Carotid arterial disease ([facility]) 05/27/2009  . Hyperlipemia 06/27/2008  . Coronary atherosclerosis 11/06/2006  . Essential hypertension 03/07/2006  . Osteoarthritis 03/07/2006    Harriet Butte, PT, MPT [facility] [address] [place], [place], [address] Phone: [phone]   Fax:  [phone] 03/05/18, 12:15 PM  Name: SHANAH GUIMARAES MRN: [id number] Date of Birth: 05-02-1933

## 2018-03-09 ENCOUNTER — Ambulatory Visit: Payer: Medicare Other | Attending: Family Medicine

## 2018-03-09 DIAGNOSIS — R2681 Unsteadiness on feet: Secondary | ICD-10-CM | POA: Diagnosis not present

## 2018-03-09 DIAGNOSIS — R42 Dizziness and giddiness: Secondary | ICD-10-CM | POA: Insufficient documentation

## 2018-03-09 DIAGNOSIS — R2689 Other abnormalities of gait and mobility: Secondary | ICD-10-CM | POA: Diagnosis not present

## 2018-03-09 NOTE — Therapy (Signed)
[facility] [address] [place], [place], [address] Phone: [phone]   Fax:  [phone]  Physical Therapy Treatment  Patient Details  Name: Felicia Cross MRN: [id number] Date of Birth: [birth year] Referring Provider (PT): Dr. Selena Batten   Encounter Date: 03/09/2018  PT End of Session - 03/09/18 1151    Visit Number  3    Number of Visits  9    Date for PT Re-Evaluation  04/30/18    Authorization Type  [employer] Medicare    PT Start Time  1106    PT Stop Time  1145    PT Time Calculation (min)  39 min    Equipment Utilized During Treatment  Gait belt    Activity Tolerance  Patient tolerated treatment well    Behavior During Therapy  [facility] for tasks assessed/performed       Past Medical History:  Diagnosis Date  . Anemia 01/18/2016   -reports on and off her whole life, on oral iron in the past remotely per report  . Arthritis   . B12 deficiency 01/18/2016  . CAD (coronary artery disease)   . CVD (cerebrovascular disease)   . Diverticulosis of colon   . H. pylori infection 10/20/2015  . Hyperlipidemia   . Hypertension   . Osteoarthritis 03/07/2006   Qualifier: Diagnosis of  By: Cato Mulligan MD, Bruce      Past Surgical History:  Procedure Laterality Date  . ABDOMINAL HYSTERECTOMY  1984  . REPLACEMENT TOTAL KNEE  2007   left  . TOTAL HIP ARTHROPLASTY  2010   Dr. Turner Daniels, right  . TOTAL KNEE ARTHROPLASTY Right 09/14/2015   Procedure: TOTAL KNEE ARTHROPLASTY;  Surgeon: Dannielle Huh, MD;  Location: [facility] OR;  Service: Orthopedics;  Laterality: Right;    There were no vitals filed for this visit.  Subjective Assessment - 03/09/18 1108    Subjective  Pt denied falls since last visit. Pt reported her exercises cause slight hip pain but it has resolved.     Pertinent History  HTN, CVD, CAD, coronary atherosclerosis, OA, hyperlipemia, anemia, hx of B TKA and L THA, Vitamin B12 deficiency    Patient Stated Goals  Improve dizziness and  balance.     Currently in Pain?  No/denies            Therex and Neuro re-ed: Access Code: [email or web address]  URL: [email or web address]  Date: 03/09/2018  Prepared by: Zerita Boers   Exercises  Forward Backward March - 4 reps - 1 sets - 2x daily - 5x weekly  Tandem Stance - 3 reps - 1 sets - 15 hold - 1x daily - 5x weekly  Romberg Stance Eyes Closed on Foam Pad - 3 reps - 1 sets - 20 hold - 2x daily - 5x weekly  Romberg Stance on Foam Pad with Head Rotation - 10 reps - 1 sets - 1-2x daily - 5x weekly  Romberg Stance with Head Nods on Foam Pad - 10 reps - 1 sets - 1-2x daily - 5x weekly  Clamshell - 10 reps - 3 sets - 1x daily - 3x weekly (therex) Sidelying hip abd on R side x5 reps (therex) poor technique with hip pain, modified to clamshells   Cues and demo for proper technique and modified prn.                  Balance Exercises - 03/09/18 1147      Balance Exercises: Standing   Rockerboard  Anterior/posterior;Head turns;EO;EC;10  seconds;30 seconds;5 reps;Other (comment);Intermittent UE support   short rockerboard   Other Standing Exercises  Performed in // bars with intermittent UE support and min guard to min A to maintan balance and safety. Cues and demo to improve weight shifting (ant/post) and for technique. Pt required one standing rest break during 2/2 incr. dizziness, and one seated rest break after rockerboard 2/2 wooziness. Pt reported it was tolerable.        PT Education - 03/09/18 1149    Education Details  PT updated HEP, as pt reported B hip pain after sidelying hip abd (modify to clamshells) and reviewed HEP to ensure proper technique.     Person(s) Educated  Patient    Methods  Explanation;Demonstration;Verbal cues;Handout    Comprehension  Returned demonstration;Verbalized understanding       PT Short Term Goals - 03/01/18 1606      PT SHORT TERM GOAL #1   Title  same as LTGs        PT Long Term Goals - 03/01/18 1606       PT LONG TERM GOAL #1   Title  Pt will be IND in HEP to improve balance, dizziness, and strength. TARGET DATE FOR ALL LTGS: 03/29/18    Status  New      PT LONG TERM GOAL #2   Title  Pt will improve gait speed to >/=2.55ft/sec, no AD, to safely amb. in the community.     Baseline  2.74ft/sec no AD    Status  New      PT LONG TERM GOAL #3   Title  Pt will improve FGA score to >/=21/30 to decr. falls risk.     Baseline  13/30    Status  New      PT LONG TERM GOAL #4   Title  Pt will report dizziness at worst is </=2/10 to improve QOL and safety during functional mobility.     Baseline  4-5/10 at worst    Status  New      PT LONG TERM GOAL #5   Title  Pt will amb. 600' over even/uneven terrain, IND, to improve functional mobility.     Status  New            Plan - 03/09/18 1152    Clinical Impression Statement  Skilled session focused on ensuring pt performing HEP with proper technique, as she reported B hip pain after performing HEP. PT modified as indicated. Pt continues to experience incr. postural sway and LOB during activities which require incr. vestibular input. Pt required several rest breaks during and after balance actvities to allow dizziness/wooziness to subside. Pt would continue to benefit from skilled PT to improve safety during functional mobility.     Rehab Potential  Good    Clinical Impairments Affecting Rehab Potential  see above.     PT Frequency  2x / week    PT Duration  4 weeks    PT Treatment/Interventions  ADLs/Self Care Home Management;Canalith Repostioning;Biofeedback;DME Instruction;Gait training;Stair training;Functional mobility training;Therapeutic activities;Therapeutic exercise;Manual techniques;Vestibular;Patient/family education;Neuromuscular re-education;Balance training    PT Next Visit Plan  Continue to try to provoke dizziness with positional changes?, high level balance and strengthening.     Consulted and Agree with Plan of Care   Patient       Patient will benefit from skilled therapeutic intervention in order to improve the following deficits and impairments:  Abnormal gait, Dizziness, Decreased balance, Decreased mobility, Decreased knowledge of use of DME, Decreased  strength  Visit Diagnosis: Dizziness and giddiness  Other abnormalities of gait and mobility  Unsteadiness on feet     Problem List Patient Active Problem List   Diagnosis Date Noted  . B12 deficiency 01/18/2016  . Anemia 01/18/2016  . S/P total knee replacement 09/14/2015  . Carotid arterial disease (HCC) 05/27/2009  . Hyperlipemia 06/27/2008  . Coronary atherosclerosis 11/06/2006  . Essential hypertension 03/07/2006  . Osteoarthritis 03/07/2006    Chrishaun Sasso L 03/09/2018, 11:54 AM  [facility] [address] [place], [place], [address] Phone: [phone]   Fax:  [phone]  Name: Felicia Cross MRN: [id number] Date of Birth: [birth year]  Zerita Boers, PT,DPT 03/09/18 11:55 AM Phone: [phone] Fax: [phone]

## 2018-03-09 NOTE — Patient Instructions (Signed)
Access Code: [email or web address]  URL: [email or web address]  Date: 03/05/2018  Prepared by: Harriet Butte   Exercises   Sidelying Diagonal Hip Abduction - 10 reps - 1 sets - 2x daily - 7x weekly  Forward Backward March - 4 reps - 1 sets - 2x daily - 5x weekly  Tandem Stance - 3 reps - 1 sets - 15 hold - 1x daily - 5x weekly  Romberg Stance Eyes Closed on Foam Pad - 3 reps - 1 sets - 20 hold - 2x daily - 5x weekly  Romberg Stance on Foam Pad with Head Rotation - 10 reps - 1 sets - 1-2x daily - 5x weekly  Romberg Stance with Head Nods on Foam Pad - 10 reps - 1 sets - 1-2x daily - 5x weekly  Clamshell - 10 reps - 3 sets - 1x daily - 3x weekly

## 2018-03-12 ENCOUNTER — Encounter: Payer: Self-pay | Admitting: Physical Therapy

## 2018-03-12 ENCOUNTER — Ambulatory Visit: Payer: Medicare Other | Admitting: Physical Therapy

## 2018-03-12 ENCOUNTER — Telehealth: Payer: Self-pay | Admitting: *Deleted

## 2018-03-12 DIAGNOSIS — R2681 Unsteadiness on feet: Secondary | ICD-10-CM | POA: Diagnosis not present

## 2018-03-12 DIAGNOSIS — R2689 Other abnormalities of gait and mobility: Secondary | ICD-10-CM | POA: Diagnosis not present

## 2018-03-12 DIAGNOSIS — R42 Dizziness and giddiness: Secondary | ICD-10-CM

## 2018-03-12 NOTE — Progress Notes (Signed)
Felicia Cross, can you contact patient and schedule appointment with [place] to discuss? I'm thinking maybe a neurology referral. Thank you Audra. We will reach out to her and would like to see her to discuss.

## 2018-03-12 NOTE — Telephone Encounter (Signed)
I left a message for the pt to return my call. 

## 2018-03-12 NOTE — Telephone Encounter (Signed)
-----   Message from Terressa Koyanagi, DO sent at 03/12/2018  4:05 PM EST -----   ----- Message ----- From: Dierdre Highman, PT Sent: 03/12/2018   2:46 PM EST To: Terressa Koyanagi, DO  Hello Dr. Selena Batten, We performed a re-assessment of Ms. Santini oculomotor and vestibular system today and found some oculomotor impairments that are red flags for central involvement.  She also reports intermittent spontaneous episodes of dizziness, not provoked by positional changes, and concurrent blurring of vision that resolves spontaneously when she sits and rests.  She also reported that the other day she was driving a very familiar route when she suddenly did not recognize where she was and could not remember where she was going.  She was able to return home.  I know that when she was in the hospital they did an MRI and did not find any acute changes but I'm concerned she may be experiencing transient attacks and wonder what you think about these episodes and if she would benefit from a referral to neurology?  Any insight or guidance you can provide will be greatly appreciated.   Thanks Dierdre Highman, PT, DPT 03/12/18    2:46 PM

## 2018-03-12 NOTE — Patient Instructions (Signed)
Access Code: [email or web address]  URL: [email or web address]  Date: 03/12/2018  Prepared by: Bufford Lope   Exercises  Forward Backward March - 4 reps - 1 sets - 2x daily - 5x weekly  Tandem Stance - 3 reps - 1 sets - 15 hold - 1x daily - 5x weekly  Romberg Stance Eyes Closed on Foam Pad - 3 reps - 1 sets - 20 hold - 2x daily - 5x weekly  Romberg Stance on Foam Pad with Head Rotation - 10 reps - 1 sets - 1-2x daily - 5x weekly  Romberg Stance with Head Nods on Foam Pad - 10 reps - 1 sets - 1-2x daily - 5x weekly  Clamshell - 10 reps - 3 sets - 1x daily - 3x weekly  Seated Gaze Stabilization with Head Rotation - 10 reps - 3 sets - 1x daily - 7x weekly  Seated Gaze Stabilization with Head Nod - 10 reps - 3 sets - 1x daily - 7x weekly

## 2018-03-12 NOTE — Therapy (Signed)
[facility] [address] [place], [place], [address] Phone: [phone]   Fax:  [phone]  Physical Therapy Treatment  Patient Details  Name: Felicia Cross MRN: [id number] Date of Birth: 10-Jun-1932 Referring Provider (PT): Dr. Selena Batten   Encounter Date: 03/12/2018  PT End of Session - 03/12/18 1343    Visit Number  4    Number of Visits  9    Date for PT Re-Evaluation  04/30/18    Authorization Type  UHC Medicare    PT Start Time  1016    PT Stop Time  1102    PT Time Calculation (min)  46 min    Activity Tolerance  Patient tolerated treatment well    Behavior During Therapy  [facility] for tasks assessed/performed       Past Medical History:  Diagnosis Date  . Anemia 01/18/2016   -reports on and off her whole life, on oral iron in the past remotely per report  . Arthritis   . B12 deficiency 01/18/2016  . CAD (coronary artery disease)   . CVD (cerebrovascular disease)   . Diverticulosis of colon   . H. pylori infection 10/20/2015  . Hyperlipidemia   . Hypertension   . Osteoarthritis 03/07/2006   Qualifier: Diagnosis of  By: Cato Mulligan MD, Bruce      Past Surgical History:  Procedure Laterality Date  . ABDOMINAL HYSTERECTOMY  1984  . REPLACEMENT TOTAL KNEE  2007   left  . TOTAL HIP ARTHROPLASTY  2010   Dr. Turner Daniels, right  . TOTAL KNEE ARTHROPLASTY Right 09/14/2015   Procedure: TOTAL KNEE ARTHROPLASTY;  Surgeon: Dannielle Huh, MD;  Location: [facility] OR;  Service: Orthopedics;  Laterality: Right;    There were no vitals filed for this visit.  Subjective Assessment - 03/12/18 1020    Subjective  Pt still walking cautiously but feels she is walking better than before.  Went to church yesterday without her cane and had no difficulty with the sloping floor.  Feels a difference since doing her balance exercises.  No dizziness with exercises.      Pertinent History  HTN, CVD, CAD, coronary atherosclerosis, OA, hyperlipemia, anemia,  hx of B TKA and L THA, Vitamin B12 deficiency    Patient Stated Goals  Improve dizziness and balance.              Vestibular Assessment - 03/12/18 1021      Vestibular Assessment   General Observation  denies changes in hearing; reports that before a vertigo attack she has blurry vision.  After closing her eyes for a few minutes symptoms resolve.  Coordination: heel-shin WFL but slow, finger-nose WFL but delayed as well.      Occulomotor Exam   Spontaneous  Absent    Gaze-induced  Direction changing nystagmus   R beating with R gaze, L beating with L gaze   Head shaking Horizontal  Absent    Smooth Pursuits  Intact    Saccades  Slow    Comment  + test of skew; convergence impaired      Vestibulo-Occular Reflex   VOR Cancellation  Corrective saccades    Comment  HIT: slight refixation saccade to L      Positional Testing   Dix-Hallpike  Dix-Hallpike Right;Dix-Hallpike Left      Dix-Hallpike Right   Dix-Hallpike Right Duration  0    Dix-Hallpike Right Symptoms  No nystagmus      Dix-Hallpike Left   Dix-Hallpike Left Duration  0    Dix-Hallpike Left Symptoms  No nystagmus                Vestibular Treatment/Exercise - 03/12/18 1354      Vestibular Treatment/Exercise   Vestibular Treatment Provided  Gaze    Gaze Exercises  X1 Viewing Horizontal;X1 Viewing Vertical      X1 Viewing Horizontal   Foot Position  seated    Reps  2    Comments  30 seconds      X1 Viewing Vertical   Foot Position  seated    Reps  2    Comments  30 seconds            PT Education - 03/12/18 1342    Education Details  re-assessment of vestibular system; possible central findings and recommendations for pt to discuss with physician at next visit, added x 1 viewing to HEP in sitting    Person(s) Educated  Patient    Methods  Explanation;Demonstration;Handout    Comprehension  Verbalized understanding;Returned demonstration       PT Short Term Goals - 03/01/18 1606       PT SHORT TERM GOAL #1   Title  same as LTGs        PT Long Term Goals - 03/01/18 1606      PT LONG TERM GOAL #1   Title  Pt will be IND in HEP to improve balance, dizziness, and strength. TARGET DATE FOR ALL LTGS: 03/29/18    Status  New      PT LONG TERM GOAL #2   Title  Pt will improve gait speed to >/=2.30ft/sec, no AD, to safely amb. in the community.     Baseline  2.19ft/sec no AD    Status  New      PT LONG TERM GOAL #3   Title  Pt will improve FGA score to >/=21/30 to decr. falls risk.     Baseline  13/30    Status  New      PT LONG TERM GOAL #4   Title  Pt will report dizziness at worst is </=2/10 to improve QOL and safety during functional mobility.     Baseline  4-5/10 at worst    Status  New      PT LONG TERM GOAL #5   Title  Pt will amb. 600' over even/uneven terrain, IND, to improve functional mobility.     Status  New            Plan - 03/12/18 1344    Clinical Impression Statement  PT continues to have increased difficulty provoking dizziness to determine if pt will require adaptation vs. habituation vestibular training.  Performed re-assessment of vestibular system and reviewed patient's symptoms when she has an episode of vertigo.  Pt reports sudden onset, not provoked by certain movement with blurring of vision that resolves spontaneously if she sits and rests for a few minutes.  She also reports that she recently had an episode where she was driving and suddenly the route did not look familiar and she couldn't remember where she was going or the route there (very familiar route).  Pt had to turn around and return home.  During reassessment of vestibular system pt demonstrated the following central signs: abnormal oculomotor exam with direction changing, gaze holding nystagmus, + test of skew, impaired convergence, saccades and corrective saccades with VOR cancellation.   Clinical testing continued to demonstrate negative for positional vertigo and  nystagmus.  Discussed findings with pt and red flags for vascular vs. central pathology that would require further medical work up by physician or referral to neurology.  Pt verbalized understanding and plans to discuss with PCP in January.  Added gaze adaptation to HEP to focus on gaze stability during head movements.  Will continue to address impairments in order to progress towards LTG.    Rehab Potential  Good    Clinical Impairments Affecting Rehab Potential  see above.     PT Frequency  2x / week    PT Duration  4 weeks    PT Treatment/Interventions  ADLs/Self Care Home Management;Canalith Repostioning;Biofeedback;DME Instruction;Gait training;Stair training;Functional mobility training;Therapeutic activities;Therapeutic exercise;Manual techniques;Vestibular;Patient/family education;Neuromuscular re-education;Balance training    PT Next Visit Plan  Added x 1 viewing in sitting - progress and progress high level balance challenges    Consulted and Agree with Plan of Care  Patient       Patient will benefit from skilled therapeutic intervention in order to improve the following deficits and impairments:  Abnormal gait, Dizziness, Decreased balance, Decreased mobility, Decreased knowledge of use of DME, Decreased strength  Visit Diagnosis: Dizziness and giddiness  Unsteadiness on feet     Problem List Patient Active Problem List   Diagnosis Date Noted  . B12 deficiency 01/18/2016  . Anemia 01/18/2016  . S/P total knee replacement 09/14/2015  . Carotid arterial disease (HCC) 05/27/2009  . Hyperlipemia 06/27/2008  . Coronary atherosclerosis 11/06/2006  . Essential hypertension 03/07/2006  . Osteoarthritis 03/07/2006   Dierdre Highman, PT, DPT 03/12/18    2:03 PM    [facility] [address] [place], [place], [address] Phone: [phone]   Fax:  [phone]  Name: Felicia Cross MRN: [id number] Date of Birth:  Mar 29, 1933

## 2018-03-13 NOTE — Progress Notes (Signed)
Patient called back and was informed of the message below.  Appt scheduled for 11/12.

## 2018-03-15 ENCOUNTER — Ambulatory Visit: Payer: Medicare Other

## 2018-03-15 VITALS — BP 133/67

## 2018-03-15 DIAGNOSIS — R2689 Other abnormalities of gait and mobility: Secondary | ICD-10-CM | POA: Diagnosis not present

## 2018-03-15 DIAGNOSIS — R42 Dizziness and giddiness: Secondary | ICD-10-CM

## 2018-03-15 DIAGNOSIS — R2681 Unsteadiness on feet: Secondary | ICD-10-CM | POA: Diagnosis not present

## 2018-03-15 NOTE — Patient Instructions (Addendum)
  Dr. Selena Batten appointment on Tuesday, 03/20/18 @ 10:30am.    Seated Gaze Stabilization with Head Rotation - 2-3x daily - 7x weekly  Seated Gaze Stabilization with Head Nod - 2-3x daily - 7x weekly

## 2018-03-15 NOTE — Therapy (Signed)
[facility] [address] [place], [place], [address] Phone: [phone]   Fax:  [phone]  Physical Therapy Treatment  Patient Details  Name: Felicia Cross MRN: [id number] Date of Birth: Jun 07, 1932 Referring Provider (PT): Dr. Selena Batten   Encounter Date: 03/15/2018  PT End of Session - 03/15/18 1305    Visit Number  5    Number of Visits  9    Date for PT Re-Evaluation  04/30/18    Authorization Type  [employer] Medicare    PT Start Time  1021    PT Stop Time  1100    PT Time Calculation (min)  39 min    Equipment Utilized During Treatment  --   min guard to S prn   Activity Tolerance  Patient tolerated treatment well    Behavior During Therapy  [facility] for tasks assessed/performed       Past Medical History:  Diagnosis Date  . Anemia 01/18/2016   -reports on and off her whole life, on oral iron in the past remotely per report  . Arthritis   . B12 deficiency 01/18/2016  . CAD (coronary artery disease)   . CVD (cerebrovascular disease)   . Diverticulosis of colon   . H. pylori infection 10/20/2015  . Hyperlipidemia   . Hypertension   . Osteoarthritis 03/07/2006   Qualifier: Diagnosis of  By: Cato Mulligan MD, Bruce      Past Surgical History:  Procedure Laterality Date  . ABDOMINAL HYSTERECTOMY  1984  . REPLACEMENT TOTAL KNEE  2007   left  . TOTAL HIP ARTHROPLASTY  2010   Dr. Turner Daniels, right  . TOTAL KNEE ARTHROPLASTY Right 09/14/2015   Procedure: TOTAL KNEE ARTHROPLASTY;  Surgeon: Dannielle Huh, MD;  Location: [facility] OR;  Service: Orthopedics;  Laterality: Right;    Vitals:   03/15/18 1053  BP: 133/67    Subjective Assessment - 03/15/18 1024    Subjective  Pt denied falls or changes since last visit, no more episodes of forgettng where she was or dizziness.     Pertinent History  HTN, CVD, CAD, coronary atherosclerosis, OA, hyperlipemia, anemia, hx of B TKA and L THA, Vitamin B12 deficiency    Patient Stated Goals  Improve  dizziness and balance.     Currently in Pain?  No/denies                       [facility] Adult PT Treatment/Exercise - 03/15/18 1039      High Level Balance   High Level Balance Activities  Side stepping;Braiding;Backward walking;Head turns;Tandem walking;Marching forwards;Negotiating over obstacles   sidestepping with mini squat, short/tall hurdles   High Level Balance Comments  Performed at counter with intermittent UE support and min guard to S for safety. Cues for sequencing during braiding, improved weight shifting during braiding and tandem stance, improved step length during backwards amb, and to improve lateral weight shifting during obstacle negotitation.  4x7'/activity.     Vestibular Treatment/Exercise - 03/15/18 1301      Vestibular Treatment/Exercise   Vestibular Treatment Provided  Gaze    Gaze Exercises  X1 Viewing Horizontal;X1 Viewing Vertical      X1 Viewing Horizontal   Foot Position  seated    Time  --   20-30 sec.    Reps  3    Comments  Pt approx. 3' from target with glasses doffed (bifocals). Cues and demo for proper technique (reduced range and continuous movement).  X1 Viewing Vertical   Foot Position  seated    Time  --   20-30 sec.    Reps  3    Comments  See above for details. updated pt's HEP.            PT Education - 03/15/18 1305    Education Details  Updated gaze stabilization with specific instructions for glasses doffed, distance from target and proper technique. PT provided pt print off for appt with MD on 03/20/18 at 10:30am, as pt had date wrong.     Person(s) Educated  Patient    Methods  Explanation    Comprehension  Verbalized understanding       PT Short Term Goals - 03/01/18 1606      PT SHORT TERM GOAL #1   Title  same as LTGs        PT Long Term Goals - 03/01/18 1606      PT LONG TERM GOAL #1   Title  Pt will be IND in HEP to improve balance, dizziness, and strength. TARGET DATE FOR ALL LTGS:  03/29/18    Status  New      PT LONG TERM GOAL #2   Title  Pt will improve gait speed to >/=2.64ft/sec, no AD, to safely amb. in the community.     Baseline  2.22ft/sec no AD    Status  New      PT LONG TERM GOAL #3   Title  Pt will improve FGA score to >/=21/30 to decr. falls risk.     Baseline  13/30    Status  New      PT LONG TERM GOAL #4   Title  Pt will report dizziness at worst is </=2/10 to improve QOL and safety during functional mobility.     Baseline  4-5/10 at worst    Status  New      PT LONG TERM GOAL #5   Title  Pt will amb. 600' over even/uneven terrain, IND, to improve functional mobility.     Status  New            Plan - 03/15/18 1306    Clinical Impression Statement  Today's skilled session focused on ensuring pt was performing HEP (gaze stabilization) correctly, and PT provided cues for technique on HEP handout. Pt experienced incr. postural sway during high level balance activities but no LOB, min guard to S provided to ensure safety. Pt did require cues for technique (see flowsheet for details). Continue with POC.     Rehab Potential  Good    Clinical Impairments Affecting Rehab Potential  see above.     PT Frequency  2x / week    PT Duration  4 weeks    PT Treatment/Interventions  ADLs/Self Care Home Management;Canalith Repostioning;Biofeedback;DME Instruction;Gait training;Stair training;Functional mobility training;Therapeutic activities;Therapeutic exercise;Manual techniques;Vestibular;Patient/family education;Neuromuscular re-education;Balance training    PT Next Visit Plan  Added x 1 viewing in sitting - progress and progress high level balance challenges (over compliant surfaces)    Consulted and Agree with Plan of Care  Patient       Patient will benefit from skilled therapeutic intervention in order to improve the following deficits and impairments:  Abnormal gait, Dizziness, Decreased balance, Decreased mobility, Decreased knowledge of use of  DME, Decreased strength  Visit Diagnosis: Other abnormalities of gait and mobility  Dizziness and giddiness  Unsteadiness on feet     Problem List Patient Active Problem List   Diagnosis Date  Noted  . B12 deficiency 01/18/2016  . Anemia 01/18/2016  . S/P total knee replacement 09/14/2015  . Carotid arterial disease (HCC) 05/27/2009  . Hyperlipemia 06/27/2008  . Coronary atherosclerosis 11/06/2006  . Essential hypertension 03/07/2006  . Osteoarthritis 03/07/2006    Joquan Lotz L 03/15/2018, 1:08 PM  [facility] [address] [place], [place], [address] Phone: [phone]   Fax:  [phone]  Name: Felicia Cross MRN: [id number] Date of Birth: 02-28-1933  Zerita Boers, PT,DPT 03/15/18 1:09 PM Phone: [phone] Fax: [phone]

## 2018-03-19 NOTE — Progress Notes (Signed)
HPI:  Using dictation device. Unfortunately this device frequently misinterprets words/phrases. Due for neurology eval, MRI/MRA head and neck Acute visit for persistent dizziness: -started in august 2019 and had evaluation with MRI and with neuro consult in the ER and dx with positional vertigo -has been doing vestibular rehab -however, received a message from physical therapist that they thought some of her symptoms are unusual and that she can see neurology -Patient reports today, she is actually a little upset that the physical therapist told me this, as she feels she is doing much better -Reports her symptoms of vertigo have decreased greater than 60 to 70% with the physical therapy and she only has rare brief episodes of vertigo may be every 2 weeks -She had told her physical therapist a bout an episode that had occurred several weeks ago -she was driving late at night and was about to take a turn when suddenly she felt like her vision changed somewhat and that the road looked more appropriate than usual, this did make her missed the turn she was about to take, this lasted less than a few seconds and then she was fine -Reports she has been fine since -Denies any headaches, recurrent vision changes, weakness, numbness, fevers, malaise, headaches, palpitations, chest pain -She has an ophthalmologist  ROS: See pertinent positives and negatives per HPI.  Past Medical History:  Diagnosis Date  . Anemia 01/18/2016   -reports on and off her whole life, on oral iron in the past remotely per report  . Arthritis   . B12 deficiency 01/18/2016  . CAD (coronary artery disease)   . CVD (cerebrovascular disease)   . Diverticulosis of colon   . H. pylori infection 10/20/2015  . Hyperlipidemia   . Hypertension   . Osteoarthritis 03/07/2006   Qualifier: Diagnosis of  By: Cato Mulligan MD, Bruce      Past Surgical History:  Procedure Laterality Date  . ABDOMINAL HYSTERECTOMY  1984  . REPLACEMENT TOTAL  KNEE  2007   left  . TOTAL HIP ARTHROPLASTY  2010   Dr. Turner Daniels, right  . TOTAL KNEE ARTHROPLASTY Right 09/14/2015   Procedure: TOTAL KNEE ARTHROPLASTY;  Surgeon: Dannielle Huh, MD;  Location: [facility] OR;  Service: Orthopedics;  Laterality: Right;    Family History  Problem Relation Age of Onset  . Diabetes Mother   . Hypertension Father   . Kidney disease Father   . Heart attack Sister   . Heart attack Brother   . Colon cancer Neg Hx   . Stomach cancer Neg Hx   . Esophageal cancer Neg Hx   . Rectal cancer Neg Hx     SOCIAL HX: See HPI   Current Outpatient Medications:  .  aspirin EC 81 MG tablet, Take 1 tablet (81 mg total) by mouth daily., Disp: , Rfl:  .  cyanocobalamin 500 MCG tablet, Take 500 mcg by mouth daily., Disp: , Rfl:  .  dimenhyDRINATE (DRAMAMINE PO), Take by mouth. As needed for vertigo, Disp: , Rfl:  .  hydrochlorothiazide (HYDRODIURIL) 25 MG tablet, TAKE ONE TABLET BY MOUTH ONCE DAILY, Disp: 90 tablet, Rfl: 3 .  losartan (COZAAR) 100 MG tablet, TAKE ONE TABLET BY MOUTH ONCE DAILY, Disp: 90 tablet, Rfl: 3 .  metoprolol tartrate (LOPRESSOR) 25 MG tablet, Take 1 tablet (25 mg total) by mouth 2 (two) times daily., Disp: 180 tablet, Rfl: 3 .  Multiple Vitamin (MULTIVITAMIN WITH MINERALS) TABS, Take 1 tablet by mouth daily. , Disp: , Rfl:  .  simvastatin (ZOCOR) 40 MG tablet, Take 1 tablet (40 mg total) by mouth daily., Disp: 90 tablet, Rfl: 3  EXAM:  Vitals:   03/20/18 1042  BP: 110/68  Pulse: 83  Temp: 97.6 F (36.4 C)  SpO2: 98%    Body mass index is 32.01 kg/m.  GENERAL: vitals reviewed and listed above, alert, oriented, appears well hydrated and in no acute distress  HEENT: atraumatic, conjunttiva clear, no obvious abnormalities on inspection of external nose and ears  NECK: no obvious masses on inspection  LUNGS: clear to auscultation bilaterally, no wheezes, rales or rhonchi, good air movement  CV: HRRR, no peripheral edema  MS: moves all extremities  without noticeable abnormality  PSYCH: pleasant and cooperative, no obvious depression or anxiety, gait is normal visual acuity grossly intact, feet and thought processing grossly intact, cranial nerves II through XII grossly intact, finger-to-nose is normal, gross visual field testing is normal  ASSESSMENT AND PLAN:  Discussed the following assessment and plan:  Vertigo - Plan: Ambulatory referral to Neurology  Visual disturbance - Plan: Ambulatory referral to Neurology  Essential hypertension  -we discussed possible serious and likely etiologies, workup and treatment, treatment risks and return precautions -after this discussion, Edna opted for referral to neurology and she agrees to call her ophthalmologist for evaluation as well.  She has opted to hold off on any further neuroimaging until seen by the specialist.  She feels that she is doing much better is actually surprised that he had her come in today.  Did recommend if she has any further episodes of visual disturbance or distortion, any other concerning symptoms, that she seek emergency care promptly for repeat neuroimaging and thorough evaluation.  Advised not to drive with vertigo. -follow up advised for routine care as scheduled in January -She is on aspirin, statin and her blood pressure is well controlled -of course, we advised Fritzi  to return or notify a doctor immediately if symptoms worsen or persist or new concerns arise.  Patient Instructions  BEFORE YOU LEAVE: -follow up: As scheduled  -We placed a referral for you as discussed. It usually takes about 1-2 weeks to process and schedule this referral. If you have not heard from [place] regarding this appointment in 2 weeks please contact our office.  -Call to schedule evaluation with your ophthalmologist as well   -Do not drive with vertigo  Seek care promptly if your symptoms worsen, new concerns arise or you are not improving with treatment.  Seek emergency care for  recurrent visual disturbance or distortion, any new symptoms with vertigo or any worsening.       Terressa Koyanagi, DO  s

## 2018-03-20 ENCOUNTER — Ambulatory Visit (INDEPENDENT_AMBULATORY_CARE_PROVIDER_SITE_OTHER): Payer: Medicare Other | Admitting: Family Medicine

## 2018-03-20 ENCOUNTER — Encounter: Payer: Self-pay | Admitting: Family Medicine

## 2018-03-20 VITALS — BP 110/68 | HR 83 | Temp 97.6°F | Ht 64.0 in | Wt 186.5 lb

## 2018-03-20 DIAGNOSIS — R42 Dizziness and giddiness: Secondary | ICD-10-CM | POA: Diagnosis not present

## 2018-03-20 DIAGNOSIS — I10 Essential (primary) hypertension: Secondary | ICD-10-CM | POA: Diagnosis not present

## 2018-03-20 DIAGNOSIS — H539 Unspecified visual disturbance: Secondary | ICD-10-CM | POA: Diagnosis not present

## 2018-03-20 NOTE — Patient Instructions (Signed)
BEFORE YOU LEAVE: -follow up: As scheduled  -We placed a referral for you as discussed. It usually takes about 1-2 weeks to process and schedule this referral. If you have not heard from us regarding this appointment in 2 weeks please contact our office.  -Call to schedule evaluation with your ophthalmologist as well   -Do not drive with vertigo  Seek care promptly if your symptoms worsen, new concerns arise or you are not improving with treatment.  Seek emergency care for recurrent visual disturbance or distortion, any new symptoms with vertigo or any worsening.

## 2018-03-21 ENCOUNTER — Ambulatory Visit: Payer: Medicare Other | Admitting: Physical Therapy

## 2018-03-22 ENCOUNTER — Ambulatory Visit: Payer: Medicare Other

## 2018-03-22 DIAGNOSIS — R2689 Other abnormalities of gait and mobility: Secondary | ICD-10-CM | POA: Diagnosis not present

## 2018-03-22 DIAGNOSIS — R2681 Unsteadiness on feet: Secondary | ICD-10-CM

## 2018-03-22 DIAGNOSIS — R42 Dizziness and giddiness: Secondary | ICD-10-CM | POA: Diagnosis not present

## 2018-03-22 NOTE — Therapy (Signed)
[facility] [address] [place], [place], [address] Phone: [phone]   Fax:  [phone]  Physical Therapy Treatment  Patient Details  Name: Felicia Cross MRN: [id number] Date of Birth: 05-Sep-1932 Referring Provider (PT): Dr. Selena Batten   Encounter Date: 03/22/2018  PT End of Session - 03/22/18 1446    Visit Number  6    Number of Visits  9    Date for PT Re-Evaluation  04/30/18    Authorization Type  UHC Medicare    PT Start Time  1404    PT Stop Time  1443    PT Time Calculation (min)  39 min    Equipment Utilized During Treatment  Gait belt    Activity Tolerance  Patient tolerated treatment well    Behavior During Therapy  [facility] for tasks assessed/performed       Past Medical History:  Diagnosis Date  . Anemia 01/18/2016   -reports on and off her whole life, on oral iron in the past remotely per report  . Arthritis   . B12 deficiency 01/18/2016  . CAD (coronary artery disease)   . CVD (cerebrovascular disease)   . Diverticulosis of colon   . H. pylori infection 10/20/2015  . Hyperlipidemia   . Hypertension   . Osteoarthritis 03/07/2006   Qualifier: Diagnosis of  By: Cato Mulligan MD, Bruce      Past Surgical History:  Procedure Laterality Date  . ABDOMINAL HYSTERECTOMY  1984  . REPLACEMENT TOTAL KNEE  2007   left  . TOTAL HIP ARTHROPLASTY  2010   Dr. Turner Daniels, right  . TOTAL KNEE ARTHROPLASTY Right 09/14/2015   Procedure: TOTAL KNEE ARTHROPLASTY;  Surgeon: Dannielle Huh, MD;  Location: [facility] OR;  Service: Orthopedics;  Laterality: Right;    There were no vitals filed for this visit.  Subjective Assessment - 03/22/18 1407    Subjective  Pt denied falls since last visit. Pt saw Dr. Selena Batten and it was recommended she go to neurology and eye doctor for f/u regarding memory lapse/visual disturbance while driving.     Pertinent History  HTN, CVD, CAD, coronary atherosclerosis, OA, hyperlipemia, anemia, hx of B TKA and L THA,  Vitamin B12 deficiency    Patient Stated Goals  Improve dizziness and balance.     Currently in Pain?  No/denies                       [facility] Adult PT Treatment/Exercise - 03/22/18 1410      High Level Balance   High Level Balance Activities  Side stepping;Braiding;Backward walking;Head turns;Tandem walking;Marching forwards    High Level Balance Comments  Performed in // bars with red/blue mats with min guard to min A for safety. Cues and demo for technique. Pt denied dizziness but reported incr. unsteadiness. Pt noted to experience narrow BOS over compliant surfaces. Pt also performed cone taps (single taps with each LE) 2x5 cones/LE with cues to improve lateral weight shifting, with 1-2 UE support.  Performed 4-6x/activity.               PT Short Term Goals - 03/01/18 1606      PT SHORT TERM GOAL #1   Title  same as LTGs        PT Long Term Goals - 03/01/18 1606      PT LONG TERM GOAL #1   Title  Pt will be IND in HEP to improve balance, dizziness, and strength. TARGET  DATE FOR ALL LTGS: 03/29/18    Status  New      PT LONG TERM GOAL #2   Title  Pt will improve gait speed to >/=2.1362ft/sec, no AD, to safely amb. in the community.     Baseline  2.353ft/sec no AD    Status  New      PT LONG TERM GOAL #3   Title  Pt will improve FGA score to >/=21/30 to decr. falls risk.     Baseline  13/30    Status  New      PT LONG TERM GOAL #4   Title  Pt will report dizziness at worst is </=2/10 to improve QOL and safety during functional mobility.     Baseline  4-5/10 at worst    Status  New      PT LONG TERM GOAL #5   Title  Pt will amb. 600' over even/uneven terrain, IND, to improve functional mobility.     Status  New            Plan - 03/22/18 1446    Clinical Impression Statement  Pt demonstrated progress as she was able to tolerate high level balance activities over compliant surfaces without seated rest break. One standing rest break 2/2  unsteadiness but pt denied dizziness. Pt required cues to improve lateral weight shifting during SLS activities. Pt noted to experience incr. postural sway during activities over incr. compliant surface (blue mat). Pt would continue to benefit from skilled PT to improve safety during functional mobility.     Rehab Potential  Good    Clinical Impairments Affecting Rehab Potential  see above.     PT Frequency  2x / week    PT Duration  4 weeks    PT Treatment/Interventions  ADLs/Self Care Home Management;Canalith Repostioning;Biofeedback;DME Instruction;Gait training;Stair training;Functional mobility training;Therapeutic activities;Therapeutic exercise;Manual techniques;Vestibular;Patient/family education;Neuromuscular re-education;Balance training    PT Next Visit Plan  Added x 1 viewing in sitting - progress and progress high level balance challenges (over compliant surfaces). Begin to assess LTGs week of 03/26/18 and likley renew.     Consulted and Agree with Plan of Care  Patient       Patient will benefit from skilled therapeutic intervention in order to improve the following deficits and impairments:  Abnormal gait, Dizziness, Decreased balance, Decreased mobility, Decreased knowledge of use of DME, Decreased strength  Visit Diagnosis: Other abnormalities of gait and mobility  Unsteadiness on feet  Dizziness and giddiness     Problem List Patient Active Problem List   Diagnosis Date Noted  . B12 deficiency 01/18/2016  . Anemia 01/18/2016  . S/P total knee replacement 09/14/2015  . Carotid arterial disease (HCC) 05/27/2009  . Hyperlipemia 06/27/2008  . Coronary atherosclerosis 11/06/2006  . Essential hypertension 03/07/2006  . Osteoarthritis 03/07/2006    Shukri Nistler L 03/22/2018, 2:49 PM  [facility] [address] [place], [place], [address] Phone: [phone]   Fax:  [phone]  Name: Nicanor Bakeundria H  Mordecai MRN: [id number] Date of Birth: 1932/07/06  Zerita BoersJennifer Braelyn Jenson, PT,DPT 03/22/18 2:49 PM Phone: [phone] Fax: [phone]

## 2018-03-23 ENCOUNTER — Ambulatory Visit: Payer: Medicare Other | Admitting: Rehabilitative and Restorative Service Providers"

## 2018-03-23 ENCOUNTER — Encounter: Payer: Self-pay | Admitting: Rehabilitative and Restorative Service Providers"

## 2018-03-23 DIAGNOSIS — R42 Dizziness and giddiness: Secondary | ICD-10-CM

## 2018-03-23 DIAGNOSIS — R2689 Other abnormalities of gait and mobility: Secondary | ICD-10-CM | POA: Diagnosis not present

## 2018-03-23 DIAGNOSIS — R2681 Unsteadiness on feet: Secondary | ICD-10-CM

## 2018-03-23 NOTE — Therapy (Signed)
[facility] [address] [place], [place], [address] Phone: [phone]   Fax:  [phone]  Physical Therapy Treatment  Patient Details  Name: Felicia Cross MRN: [id number] Date of Birth: October 08, 1932 Referring Provider (PT): Dr. Maudie Mercury   Encounter Date: 03/23/2018  PT End of Session - 03/23/18 1325    Visit Number  7    Number of Visits  9    Date for PT Re-Evaluation  04/30/18    Authorization Type  UHC Medicare    PT Start Time  1322    PT Stop Time  1400    PT Time Calculation (min)  38 min    Equipment Utilized During Treatment  Gait belt    Activity Tolerance  Patient tolerated treatment well    Behavior During Therapy  WFL for tasks assessed/performed       Past Medical History:  Diagnosis Date  . Anemia 01/18/2016   -reports on and off her whole life, on oral iron in the past remotely per report  . Arthritis   . B12 deficiency 01/18/2016  . CAD (coronary artery disease)   . CVD (cerebrovascular disease)   . Diverticulosis of colon   . H. pylori infection 10/20/2015  . Hyperlipidemia   . Hypertension   . Osteoarthritis 03/07/2006   Qualifier: Diagnosis of  By: Leanne Chang MD, Bruce      Past Surgical History:  Procedure Laterality Date  . ABDOMINAL HYSTERECTOMY  1984  . REPLACEMENT TOTAL KNEE  2007   left  . TOTAL HIP ARTHROPLASTY  2010   Dr. Mayer Camel, right  . TOTAL KNEE ARTHROPLASTY Right 09/14/2015   Procedure: TOTAL KNEE ARTHROPLASTY;  Surgeon: Vickey Huger, MD;  Location: [facility];  Service: Orthopedics;  Laterality: Right;    There were no vitals filed for this visit.  Subjective Assessment - 03/23/18 1324    Subjective  The patient reports "my balance is much better" since starting therapist.  She is working to get back "on balance" without use of single point cane.  No dizziness noted.  She is doing HEP.     Pertinent History  HTN, CVD, CAD, coronary atherosclerosis, OA, hyperlipemia, anemia, hx of  B TKA and L THA, Vitamin B12 deficiency    Patient Stated Goals  Improve dizziness and balance.     Currently in Pain?  No/denies         [facility] PT Assessment - 03/23/18 1327      Ambulation/Gait   Ambulation/Gait Assistance  7: Independent      Functional Gait  Assessment   Gait assessed   Yes    Gait Level Surface  Walks 20 ft, slow speed, abnormal gait pattern, evidence for imbalance or deviates 10-15 in outside of the 12 in walkway width. Requires more than 7 sec to ambulate 20 ft.   7.09 sec   Change in Gait Speed  Able to smoothly change walking speed without loss of balance or gait deviation. Deviate no more than 6 in outside of the 12 in walkway width.    Gait with Horizontal Head Turns  Performs head turns smoothly with slight change in gait velocity (eg, minor disruption to smooth gait path), deviates 6-10 in outside 12 in walkway width, or uses an assistive device.    Gait with Vertical Head Turns  Performs task with slight change in gait velocity (eg, minor disruption to smooth gait path), deviates 6 - 10 in outside 12 in walkway width or uses  assistive device    Gait and Pivot Turn  Pivot turns safely within 3 sec and stops quickly with no loss of balance.    Step Over Obstacle  Is able to step over one shoe box (4.5 in total height) without changing gait speed. No evidence of imbalance.    Gait with Narrow Base of Support  Is able to ambulate for 10 steps heel to toe with no staggering.    Gait with Eyes Closed  Walks 20 ft, slow speed, abnormal gait pattern, evidence for imbalance, deviates 10-15 in outside 12 in walkway width. Requires more than 9 sec to ambulate 20 ft.    Ambulating Backwards  Walks 20 ft, slow speed, abnormal gait pattern, evidence for imbalance, deviates 10-15 in outside 12 in walkway width.    Steps  Alternating feet, must use rail.    Total Score  20    FGA comment:  20/30 improved from 13/30                   [facility] Adult PT  Treatment/Exercise - 03/23/18 1327      Ambulation/Gait   Ambulation/Gait  Yes    Ambulation Distance (Feet)  140 Feet    Assistive device  None    Gait Pattern  Step-through pattern;Decreased trunk rotation    Ambulation Surface  Level;Indoor    Gait velocity  2.93 ft/sec      Self-Care   Self-Care  Other Self-Care Comments    Other Self-Care Comments   Discussed gym routine:  patient goes to [employer] doing seated stepper and a couple of machines.  Provided handout for [employer] silver sneakers classes.       Neuro Re-ed    Neuro Re-ed Details   Standing dynamic balance activities performing tandem gait on compliant surface beam with CGA.    Partial heel/toe on compliant surfaces with head motion with CGA.   Zoom ball while standing on compliant mat surfaces initially with feet apart progressing to feet partial heel/toe.               PT Short Term Goals - 03/01/18 1606      PT SHORT TERM GOAL #1   Title  same as LTGs        PT Long Term Goals - 03/23/18 1337      PT LONG TERM GOAL #1   Title  Pt will be IND in HEP to improve balance, dizziness, and strength. TARGET DATE FOR ALL LTGS: 03/29/18    Status  On-going      PT LONG TERM GOAL #2   Title  Pt will improve gait speed to >/=2.30f/sec, no AD, to safely amb. in the community.     Baseline  2.321fsec no AD; 2.93 ft/sec without device    Status  Achieved      PT LONG TERM GOAL #3   Title  Pt will improve FGA score to >/=21/30 to decr. falls risk.     Baseline  13/30; improved to 20/30     Status  Partially Met      PT LONG TERM GOAL #4   Title  Pt will report dizziness at worst is </=2/10 to improve QOL and safety during functional mobility.     Baseline  4-5/10 at worst; patient reports no dizziness rating it 0/10 without recent spells of dizziness.     Status  Achieved      PT LONG TERM GOAL #5   Title  Pt will amb. 600' over even/uneven terrain, IND, to improve functional mobility.     Status  On-going             Plan - 03/23/18 1605    Clinical Impression Statement  The patient met LTGs for improved dizziness and improved gait speed.  She improved significantly on FGA from 13/30 up to 20/30.   PT and patient discussed renewal versus d/c and she notes she feels significant improvement and feels she can continue with her HEP after next week.     PT Treatment/Interventions  ADLs/Self Care Home Management;Canalith Repostioning;Biofeedback;DME Instruction;Gait training;Stair training;Functional mobility training;Therapeutic activities;Therapeutic exercise;Manual techniques;Vestibular;Patient/family education;Neuromuscular re-education;Balance training    PT Next Visit Plan  Finish checking LTGs, progress HEP, plan to d/c next week (patient notes she feels ready for d/c versus renewal).    Consulted and Agree with Plan of Care  Patient       Patient will benefit from skilled therapeutic intervention in order to improve the following deficits and impairments:  Abnormal gait, Dizziness, Decreased balance, Decreased mobility, Decreased knowledge of use of DME, Decreased strength  Visit Diagnosis: Other abnormalities of gait and mobility  Unsteadiness on feet  Dizziness and giddiness     Problem List Patient Active Problem List   Diagnosis Date Noted  . B12 deficiency 01/18/2016  . Anemia 01/18/2016  . S/P total knee replacement 09/14/2015  . Carotid arterial disease ([place]) 05/27/2009  . Hyperlipemia 06/27/2008  . Coronary atherosclerosis 11/06/2006  . Essential hypertension 03/07/2006  . Osteoarthritis 03/07/2006    Irlanda Croghan, PT 03/23/2018, 4:11 PM  [facility] [address] [place], [place], [address] Phone: [phone]   Fax:  [phone]  Name: Felicia Cross MRN: [id number] Date of Birth: Mar 10, 1933

## 2018-03-27 ENCOUNTER — Ambulatory Visit: Payer: Medicare Other

## 2018-03-27 DIAGNOSIS — R2689 Other abnormalities of gait and mobility: Secondary | ICD-10-CM | POA: Diagnosis not present

## 2018-03-27 DIAGNOSIS — R42 Dizziness and giddiness: Secondary | ICD-10-CM | POA: Diagnosis not present

## 2018-03-27 DIAGNOSIS — R2681 Unsteadiness on feet: Secondary | ICD-10-CM | POA: Diagnosis not present

## 2018-03-27 NOTE — Therapy (Signed)
[facility] [address] [place], [place], [address] Phone: [phone]   Fax:  [phone]  Physical Therapy Treatment  Patient Details  Name: Felicia Cross MRN: [id number] Date of Birth: 16-May-1932 Referring Provider (PT): Dr. Maudie Mercury   Encounter Date: 03/27/2018  PT End of Session - 03/27/18 0954    Visit Number  8    Number of Visits  9    Date for PT Re-Evaluation  04/30/18    Authorization Type  UHC Medicare    PT Start Time  0932    PT Stop Time  0949    PT Time Calculation (min)  17 min    Activity Tolerance  Patient tolerated treatment well    Behavior During Therapy  [facility] for tasks assessed/performed       Past Medical History:  Diagnosis Date  . Anemia 01/18/2016   -reports on and off her whole life, on oral iron in the past remotely per report  . Arthritis   . B12 deficiency 01/18/2016  . CAD (coronary artery disease)   . CVD (cerebrovascular disease)   . Diverticulosis of colon   . H. pylori infection 10/20/2015  . Hyperlipidemia   . Hypertension   . Osteoarthritis 03/07/2006   Qualifier: Diagnosis of  By: Leanne Chang MD, Bruce      Past Surgical History:  Procedure Laterality Date  . ABDOMINAL HYSTERECTOMY  1984  . REPLACEMENT TOTAL KNEE  2007   left  . TOTAL HIP ARTHROPLASTY  2010   Dr. Mayer Camel, right  . TOTAL KNEE ARTHROPLASTY Right 09/14/2015   Procedure: TOTAL KNEE ARTHROPLASTY;  Surgeon: Vickey Huger, MD;  Location: [facility];  Service: Orthopedics;  Laterality: Right;    There were no vitals filed for this visit.  Subjective Assessment - 03/27/18 0934    Subjective  Pt feels good about today being her last visit. Pt reports she will start Silver Sneakers at the [facility].     Pertinent History  HTN, CVD, CAD, coronary atherosclerosis, OA, hyperlipemia, anemia, hx of B TKA and L THA, Vitamin B12 deficiency    Patient Stated Goals  Improve dizziness and balance.     Currently in Pain?  No/denies                        [facility] Adult PT Treatment/Exercise - 03/27/18 0936      Ambulation/Gait   Ambulation/Gait  Yes    Ambulation/Gait Assistance  7: Independent    Ambulation/Gait Assistance Details  No LOB noted.     Ambulation Distance (Feet)  615 Feet    Assistive device  None    Gait Pattern  Step-through pattern;Decreased trunk rotation    Ambulation Surface  Level;Unlevel;Indoor;Outdoor;Paved            Self Care: PT Education - 03/27/18 0953    Education Details  PT reviewed pt's HEP with pt and pt was able to verbalize how she's performing and denied any questions. Pt also reported she goes to the [facility] and plans to start Silver Sneakers on Mondays and Wednesdays. PT discussed reducing frequency of HEP once she begins Silver Sneakers.     Person(s) Educated  Patient    Methods  Explanation;Handout    Comprehension  Verbalized understanding       PT Short Term Goals - 03/01/18 1606      PT SHORT TERM GOAL #1   Title  same as LTGs  PT Long Term Goals - 03/27/18 0955      PT LONG TERM GOAL #1   Title  Pt will be IND in HEP to improve balance, dizziness, and strength. TARGET DATE FOR ALL LTGS: 03/29/18    Status  Achieved      PT LONG TERM GOAL #2   Title  Pt will improve gait speed to >/=2.52f/sec, no AD, to safely amb. in the community.     Baseline  2.384fsec no AD; 2.93 ft/sec without device    Status  Achieved      PT LONG TERM GOAL #3   Title  Pt will improve FGA score to >/=21/30 to decr. falls risk.     Baseline  13/30; improved to 20/30     Status  Partially Met      PT LONG TERM GOAL #4   Title  Pt will report dizziness at worst is </=2/10 to improve QOL and safety during functional mobility.     Baseline  4-5/10 at worst; patient reports no dizziness rating it 0/10 without recent spells of dizziness.     Status  Achieved      PT LONG TERM GOAL #5   Title  Pt will amb. 600' over even/uneven terrain, IND, to improve  functional mobility.     Status  Achieved            Plan - 03/27/18 0955    Clinical Impression Statement  Pt met LTGs 1 and 5. Please see pt d/c summary for details.     PT Treatment/Interventions  ADLs/Self Care Home Management;Canalith Repostioning;Biofeedback;DME Instruction;Gait training;Stair training;Functional mobility training;Therapeutic activities;Therapeutic exercise;Manual techniques;Vestibular;Patient/family education;Neuromuscular re-education;Balance training    PT Next Visit Plan  d/c    Consulted and Agree with Plan of Care  Patient       Patient will benefit from skilled therapeutic intervention in order to improve the following deficits and impairments:  Abnormal gait, Dizziness, Decreased balance, Decreased mobility, Decreased knowledge of use of DME, Decreased strength  Visit Diagnosis: Other abnormalities of gait and mobility  Dizziness and giddiness  Unsteadiness on feet     Problem List Patient Active Problem List   Diagnosis Date Noted  . B12 deficiency 01/18/2016  . Anemia 01/18/2016  . S/P total knee replacement 09/14/2015  . [place] ([place]01/19/2011  . Hyperlipemia 06/27/2008  . Coronary atherosclerosis 11/06/2006  . Essential hypertension 03/07/2006  . Osteoarthritis 03/07/2006    Felicia Valtierra L 03/27/2018, 9:57 AM  [place]: [phone] Fax:  [phone]: Felicia Cross: [id number] of Birth: 1012-Jun-1934PHYSICAL THERAPY DISCHARGE SUMMARY  Visits from Start of Care: 8  Current functional level related to goals / functional outcomes: PT Long Term Goals - 03/27/18 0955      PT LONG TERM GOAL #1   Title  Pt will be IND in HEP to improve balance, dizziness, and strength. TARGET DATE FOR ALL LTGS: 03/29/18    Status  Achieved      PT LONG TERM GOAL #2   Title  Pt will improve  gait speed to >/=2.6232fec, no AD, to safely amb. in the community.     Baseline  2.3ft70fc no AD; 2.93 ft/sec without device    Status  Achieved      PT LONG TERM GOAL #3   Title  Pt will improve FGA score to >/=21/30 to decr. falls risk.  Baseline  13/30; improved to 20/30     Status  Partially Met      PT LONG TERM GOAL #4   Title  Pt will report dizziness at worst is </=2/10 to improve QOL and safety during functional mobility.     Baseline  4-5/10 at worst; patient reports no dizziness rating it 0/10 without recent spells of dizziness.     Status  Achieved      PT LONG TERM GOAL #5   Title  Pt will amb. 600' over even/uneven terrain, IND, to improve functional mobility.     Status  Achieved         Remaining deficits: Decr. Balance based on FGA score, however, pt denied recent LOB and PT did not note LOB during gait training.    Education / Equipment: HEP  Plan: Patient agrees to discharge.  Patient goals were met. Patient is being discharged due to meeting the stated rehab goals.  ?????          Geoffry Paradise, PT,DPT 03/27/18 9:58 AM Phone: [phone] Fax: [phone]

## 2018-04-10 ENCOUNTER — Encounter: Payer: Self-pay | Admitting: Neurology

## 2018-04-10 ENCOUNTER — Ambulatory Visit: Payer: Medicare Other | Admitting: Neurology

## 2018-04-10 VITALS — BP 116/73 | HR 67 | Ht 64.0 in | Wt 186.0 lb

## 2018-04-10 DIAGNOSIS — G2581 Restless legs syndrome: Secondary | ICD-10-CM

## 2018-04-10 DIAGNOSIS — R42 Dizziness and giddiness: Secondary | ICD-10-CM | POA: Diagnosis not present

## 2018-04-10 MED ORDER — GABAPENTIN 100 MG PO CAPS: 100.0000 mg | ORAL_CAPSULE | Freq: Every day | ORAL | 3 refills | Status: DC

## 2018-04-10 NOTE — Progress Notes (Signed)
[facility] [address] [place] [id number] [phone]       OFFICE CONSULT NOTE  Ms. Nicanor BakeAundria H Bennie Date of Birth:  [birth year] Medical Record Number:  [id number]   Referring MD: Kriste BasqueHannah Kim  Reason for Referral: dizziness  HPI: Ms Letitia LibraGaynor is a pleasant 84 year old lady seen today for initial office consultation visit for dizziness and leg discomfort.  History is obtained from her, review of referral notes and I have personally reviewed imaging films in PACS.  On 12/08/2017 she was seen in the ER with episode of sudden onset of dizziness when she tried to move around she felt she was off balance.  She denied to vertigo nausea vomiting.  She felt her vision may be slightly blurred.  She also complained of mild headache.  She was treated with headache cocktail with improvement.  She had some subjective mild left-sided incoordination as per ER physician's notes.  MRI scan of the brain was obtained on 12/09/2017 which I personally reviewed showed no acute abnormality and only showed mild changes of age-appropriate chronic small vessel disease.  Patient dizziness was felt to be related to vestibular dysfunction.  And was discharged on meclizine.  She was referred for outpatient therapy which she is participating with and has noted significant improvement in her dizziness and balance which is now back to normal.  She patient feels that her vision is still not clear she plans to see an eye doctor and has not yet made an appointment.  She denies any ringing in the years, decreased hearing or vertigo.  There is no history of ear infection closed head injury.  She denies any falls tingling numbness or burning in her feet.  She does however complain of some discomfort in her legs at night.  She feels better if she gets up and walks around.  She has not not been evaluated or treated for restless leg syndromes.  ROS:   14 system review of systems is positive for  rash, shortness of  breath easy bruising increased thirst, dizziness, restless legs and all other systems negative  PMH:  Past Medical History:  Diagnosis Date  . Anemia 01/18/2016   -reports on and off her whole life, on oral iron in the past remotely per report  . Arthritis   . B12 deficiency 01/18/2016  . CAD (coronary artery disease)   . CVD (cerebrovascular disease)   . Diverticulosis of colon   . H. pylori infection 10/20/2015  . Hyperlipidemia   . Hypertension   . Osteoarthritis 03/07/2006   Qualifier: Diagnosis of  By: Cato MulliganSwords MD, Bruce      Social History:  Social History   Socioeconomic History  . Marital status: Married    Spouse name: Not on file  . Number of children: Not on file  . Years of education: Not on file  . Highest education level: Not on file  Occupational History  . Not on file  Social Needs  . Financial resource strain: Not on file  . Food insecurity:    Worry: Not on file    Inability: Not on file  . Transportation needs:    Medical: Not on file    Non-medical: Not on file  Tobacco Use  . Smoking status: Former Smoker    Packs/day: 1.00    Years: 30.00    Pack years: 30.00    Last attempt to quit: 11/01/1980    Years since quitting: 37.4  . Smokeless tobacco: Never Used  Substance and Sexual Activity  . Alcohol use: Yes    Alcohol/week: 0.0 standard drinks    Comment: glass of wine  . Drug use: No  . Sexual activity: Not on file  Lifestyle  . Physical activity:    Days per week: Not on file    Minutes per session: Not on file  . Stress: Not on file  Relationships  . Social connections:    Talks on phone: Not on file    Gets together: Not on file    Attends religious service: Not on file    Active member of club or organization: Not on file    Attends meetings of clubs or organizations: Not on file    Relationship status: Not on file  . Intimate partner violence:    Fear of current or ex partner: Not on file    Emotionally abused: Not on file     Physically abused: Not on file    Forced sexual activity: Not on file  Other Topics Concern  . 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 [place]      Home 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, Information systems manager and two great grandchildren age 68 and 28      Spiritual Beliefs: Christian      Lifestyle: regular exercise at the [facility]; healthy diet       Medications:   Current Outpatient Medications on File Prior to Visit  Medication Sig Dispense Refill  . aspirin EC 81 MG tablet Take 1 tablet (81 mg total) by mouth daily.    . cyanocobalamin 500 MCG tablet Take 500 mcg by mouth daily.    Marland Kitchen dimenhyDRINATE (DRAMAMINE PO) Take by mouth. As needed for vertigo    . hydrochlorothiazide (HYDRODIURIL) 25 MG tablet TAKE ONE TABLET BY MOUTH ONCE DAILY 90 tablet 3  . losartan (COZAAR) 100 MG tablet TAKE ONE TABLET BY MOUTH ONCE DAILY 90 tablet 3  . metoprolol tartrate (LOPRESSOR) 25 MG tablet Take 1 tablet (25 mg total) by mouth 2 (two) times daily. 180 tablet 3  . Multiple Vitamin (MULTIVITAMIN WITH MINERALS) TABS Take 1 tablet by mouth daily.     . simvastatin (ZOCOR) 40 MG tablet Take 1 tablet (40 mg total) by mouth daily. 90 tablet 3   No current facility-administered medications on file prior to visit.     Allergies:   Allergies  Allergen Reactions  . Codeine Nausea And Vomiting  . Hydrocodone Nausea And Vomiting    Physical Exam General: Pleasant elderly lady seated, in no evident distress Head: head normocephalic and atraumatic.   Neck: supple with no carotid or supraclavicular bruits Cardiovascular: regular rate and rhythm, no murmurs Musculoskeletal: no deformity Skin:  no rash/petichiae Vascular:  Normal pulses all extremities  Neurologic Exam Mental Status: Awake and fully alert. Oriented to place and time. Recent and remote memory intact. Attention span, concentration and fund of knowledge appropriate.  Mood and affect appropriate.  Cranial Nerves: Fundoscopic exam reveals sharp disc margins. Pupils equal, briskly reactive to light. Extraocular movements full without nystagmus. Visual fields full to confrontation. Hearing intact. Facial sensation intact. Face, tongue, palate moves normally and symmetrically.  Motor: Normal bulk and tone. Normal strength in all tested extremity muscles. Sensory.: intact to touch , pinprick , position and vibratory sensation.  Coordination: Rapid alternating movements normal in all extremities. Finger-to-nose and heel-to-shin performed accurately bilaterally. Gait and Station: Arises from  chair without difficulty. Stance is normal. Gait demonstrates normal stride length and balance . Able to heel, toe and tandem walk with moderate difficulty.  Reflexes: 1+ and symmetric. Toes downgoing.       ASSESSMENT: 84 year old pleasant lady with episode of sudden onset dizziness and imbalance of unclear etiology.  She also has nocturnal leg discomfort likely from mild restless leg syndrome     PLAN:  I had a long discussion with the patient regarding her dizziness and leg discomfort at night.  The dizziness appears to be improving I encouraged her to continue doing exercises as recommended by physical therapy.  Trial of gabapentin 100 mg at night to help with restless legs and check EMG nerve conduction study.  Greater than 50% time during this 45-minute consultation visit was spent on counseling and coordination of care about her dizziness and restless leg and answering questions she will return for follow-up in the future only as necessary and no schedule appointment was made. Delia Heady, MD  [facility] [address] [place], [place] [address]  Phone [phone] Fax [phone] Note: This document was prepared with digital dictation and possible smart phrase technology. Any transcriptional errors that result from this process are  unintentional.

## 2018-04-10 NOTE — Patient Instructions (Signed)
I had a long discussion with the patient regarding her dizziness and leg discomfort at night.  The dizziness appears to be improving I encouraged her to continue doing exercises as recommended by physical therapy.  Trial of gabapentin 100 mg at night to help with restless legs and check EMG nerve conduction study.  She will return for follow-up in the future only as necessary and no schedule appointment was made. Restless Legs Syndrome Restless legs syndrome is a condition that causes uncomfortable feelings or sensations in the legs, especially while sitting or lying down. The sensations usually cause an overwhelming urge to move the legs. The arms can also sometimes be affected. The condition can range from mild to severe. The symptoms often interfere with a person's ability to sleep. What are the causes? The cause of this condition is not known. What increases the risk? This condition is more likely to develop in:  People who are older than age 82.  Pregnant women. In general, restless legs syndrome is more common in women than in men.  People who have a family history of the condition.  People who have certain medical conditions, such as iron deficiency, kidney disease, Parkinson disease, or nerve damage.  People who take certain medicines, such as medicines for high blood pressure, nausea, colds, allergies, depression, and some heart conditions.  What are the signs or symptoms? The main symptom of this condition is uncomfortable sensations in the legs. These sensations may be:  Described as pulling, tingling, prickling, throbbing, crawling, or burning.  Worse while you are sitting or lying down.  Worse during periods of rest or inactivity.  Worse at night, often interfering with your sleep.  Accompanied by a very strong urge to move your legs.  Temporarily relieved by movement of your legs.  The sensations usually affect both sides of the body. The arms can also be affected, but  this is rare. People who have this condition often have tiredness during the day because of their lack of sleep at night. How is this diagnosed? This condition may be diagnosed based on your description of the symptoms. You may also have tests, including blood tests, to check for other conditions that may lead to your symptoms. In some cases, you may be asked to spend some time in a sleep lab so your sleeping can be monitored. How is this treated? Treatment for this condition is focused on managing the symptoms. Treatment may include:  Self-help and lifestyle changes.  Medicines.  Follow these instructions at home:  Take medicines only as directed by your health care provider.  Try these methods to get temporary relief from the uncomfortable sensations: ? Massage your legs. ? Walk or stretch. ? Take a cold or hot bath.  Practice good sleep habits. For example, go to bed and get up at the same time every day.  Exercise regularly.  Practice ways of relaxing, such as yoga or meditation.  Avoid caffeine and alcohol.  Do not use any tobacco products, including cigarettes, chewing tobacco, or electronic cigarettes. If you need help quitting, ask your health care provider.  Keep all follow-up visits as directed by your health care provider. This is important. Contact a health care provider if: Your symptoms do not improve with treatment, or they get worse. This information is not intended to replace advice given to you by your health care provider. Make sure you discuss any questions you have with your health care provider. Document Released: 04/15/2002 Document Revised: 10/01/2015 Document  Reviewed: 04/21/2014 Elsevier Interactive Patient Education  [employer]2018 Elsevier Inc.

## 2018-04-12 ENCOUNTER — Encounter: Payer: Self-pay | Admitting: Diagnostic Neuroimaging

## 2018-04-15 ENCOUNTER — Other Ambulatory Visit: Payer: Self-pay | Admitting: Cardiology

## 2018-04-17 NOTE — Telephone Encounter (Signed)
Rx(s) sent to pharmacy electronically.  

## 2018-05-17 ENCOUNTER — Encounter: Payer: Self-pay | Admitting: Diagnostic Neuroimaging

## 2018-05-21 NOTE — Progress Notes (Signed)
HPI:  Using dictation device. Unfortunately this device frequently misinterprets words/phrases.  Here for CPE: Due for labs, mammo, dexa, shinrix, tetanus booster -Concerns and/or follow up today:  Chronic medical problems summarized below were reviewed for changes. Reports is doing well[facility] Trying to eat healthy. Plans to call to schedule mammogram.  Vertigo: -now seeing neurology -doing pt and trial gabapentin -doing great today per her report  HTN/HLD: -sees Dr. Jens Som, cardiologist for hx mild CAD, mild carotid art dz -meds: hctz, losartan, metoprolol, simvastatin, asa -denies: CP, SOB, DOE, stain intol  B12 def/Chronic Anemia: -oral b12, iron on and off her whole life per report -colonoscopy 2014 normal -denies bleeding, dizziness, fatigue  -Diet: variety of foods, balance and well rounded, larger portion sizes -Exercise: no regular exercise -Taking folic acid, vitamin D or calcium: no -Diabetes and Dyslipidemia Screening: due for recheck -Vaccines: see vaccine section EPIC -pap history: n/a -FDLMP: see nursing notes -sexual activity: not discussed -wants STI testing (Hep C if born [birth year]-65): no -FH breast, colon or ovarian ca: see FH Last mammogram: 2017 Last colon cancer screening: colonoscopy 2014 Breast Ca Risk Assessment: see family history and pt history DEXA (>/= 35): referred for this but she did not go  -Alcohol, Tobacco, drug use: see social history  Review of Systems - no fevers, unintentional weight loss, vision loss, hearing loss, chest pain, sob, hemoptysis, melena, hematochezia, hematuria, genital discharge, changing or concerning skin lesions, bleeding, bruising, loc, thoughts of self harm or SI  Past Medical History:  Diagnosis Date  . Anemia 01/18/2016   -reports on and off her whole life, on oral iron in the past remotely per report  . Arthritis   . B12 deficiency 01/18/2016  . CAD (coronary artery disease)   .  CVD (cerebrovascular disease)   . Diverticulosis of colon   . H. pylori infection 10/20/2015  . Hyperlipidemia   . Hypertension   . Osteoarthritis 03/07/2006   Qualifier: Diagnosis of  By: Cato Mulligan MD, Bruce      Past Surgical History:  Procedure Laterality Date  . ABDOMINAL HYSTERECTOMY  1984  . REPLACEMENT TOTAL KNEE  2007   left  . TOTAL HIP ARTHROPLASTY  2010   Dr. Turner Daniels, right  . TOTAL KNEE ARTHROPLASTY Right 09/14/2015   Procedure: TOTAL KNEE ARTHROPLASTY;  Surgeon: Dannielle Huh, MD;  Location: [facility] OR;  Service: Orthopedics;  Laterality: Right;    Family History  Problem Relation Age of Onset  . Diabetes Mother   . Hypertension Father   . Kidney disease Father   . Heart attack Sister   . Heart attack Brother   . Colon cancer Neg Hx   . Stomach cancer Neg Hx   . Esophageal cancer Neg Hx   . Rectal cancer Neg Hx     Social History   Socioeconomic History  . Marital status: Married    Spouse name: Not on file  . Number of children: Not on file  . Years of education: Not on file  . Highest education level: Not on file  Occupational History  . Not on file  Social Needs  . Financial resource strain: Not on file  . Food insecurity:    Worry: Not on file    Inability: Not on file  . Transportation needs:    Medical: Not on file    Non-medical: Not on file  Tobacco Use  . Smoking status: Former Smoker    Packs/day: 1.00  Years: 30.00    Pack years: 30.00    Last attempt to quit: 11/01/1980    Years since quitting: 37.5  . Smokeless tobacco: Never Used  Substance and Sexual Activity  . Alcohol use: Yes    Alcohol/week: 0.0 standard drinks    Comment: glass of wine  . Drug use: No  . Sexual activity: Not on file  Lifestyle  . Physical activity:    Days per week: Not on file    Minutes per session: Not on file  . Stress: Not on file  Relationships  . Social connections:    Talks on phone: Not on file    Gets together: Not on file    Attends religious  service: Not on file    Active member of club or organization: Not on file    Attends meetings of clubs or organizations: Not on file    Relationship status: Not on file  Other Topics Concern  . Not on file  Social History Narrative   Updated 01/07/15   Work or School: retired from [employer], volunteers at [employer] [place]      Home 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, Information systems managergrandaughter and two great grandchildren age 263 and 486      Spiritual Beliefs: Christian      Lifestyle: [facility] at [facility] [employer]; healthy diet        Current Outpatient Medications:  .  aspirin EC 81 MG tablet, Take 1 tablet (81 mg total) by mouth daily., Disp: , Rfl:  .  cyanocobalamin 500 MCG tablet, Take 500 mcg by mouth daily., Disp: , Rfl:  .  dimenhyDRINATE (DRAMAMINE PO), Take by mouth. As needed for vertigo, Disp: , Rfl:  .  gabapentin (NEURONTIN) 100 MG capsule, Take 1 capsule (100 mg total) by mouth at bedtime., Disp: 30 capsule, Rfl: 3 .  hydrochlorothiazide (HYDRODIURIL) 25 MG tablet, TAKE ONE TABLET BY MOUTH ONCE DAILY, Disp: 90 tablet, Rfl: 3 .  hydrochlorothiazide (HYDRODIURIL) 25 MG tablet, hydrochlorothiazide 25 mg tablet, Disp: , Rfl:  .  losartan (COZAAR) 100 MG tablet, TAKE 1 TABLET BY MOUTH ONCE DAILY, Disp: 90 tablet, Rfl: 3 .  meclizine (ANTIVERT) 25 MG tablet, meclizine 25 mg tablet  TAKE 1 TABLET BY MOUTH THREE TIMES A DAY AS NEEDED DAILY, Disp: , Rfl:  .  metoprolol tartrate (LOPRESSOR) 25 MG tablet, Take 1 tablet (25 mg total) by mouth 2 (two) times daily., Disp: 180 tablet, Rfl: 3 .  metoprolol tartrate (LOPRESSOR) 25 MG tablet, metoprolol tartrate 25 mg tablet, Disp: , Rfl:  .  Multiple Vitamin (MULTIVITAMIN WITH MINERALS) TABS, Take 1 tablet by mouth daily. , Disp: , Rfl:  .  Multiple Vitamins-Minerals (MULTIVITAMIN ADULT EXTRA C PO), multivitamin, Disp: , Rfl:  .  simvastatin (ZOCOR) 40 MG tablet, Take 1 tablet (40 mg total) by mouth daily., Disp: 90 tablet, Rfl: 3 .  simvastatin (ZOCOR) 40  MG tablet, simvastatin 40 mg tablet, Disp: , Rfl:  .  traMADol (ULTRAM) 50 MG tablet, TAKE ONE TO TWO TABLETS BY MOUTH EVERY 6 HOURS AS NEEDED FOR PAIN, Disp: , Rfl:   EXAM:  Vitals:   05/24/18 1002  BP: 128/80  Pulse: (!) 58  Temp: 97.6 F (36.4 C)    GENERAL: vitals reviewed and listed below, alert, oriented, appears well hydrated and in no acute distress  HEENT: head atraumatic, PERRLA, normal appearance of eyes, ears, nose and mouth. moist mucus membranes.  NECK: supple, no masses or lymphadenopathy  LUNGS: clear  to auscultation bilaterally, no rales, rhonchi or wheeze  CV: HRRR, no peripheral edema or cyanosis, normal pedal pulses  ABDOMEN: bowel sounds normal, soft, non tender to palpation, no masses, no rebound or guarding  GU/BREAST: declined  SKIN: no rash or abnormal lesions  MS: normal gait, moves all extremities normally  NEURO: normal gait, speech and thought processing grossly intact, muscle tone grossly intact throughout  PSYCH: normal affect, pleasant and cooperative  ASSESSMENT AND PLAN:  Discussed the following assessment and plan:  PREVENTIVE EXAM: -Discussed and advised all [place] preventive services health task force level A and B recommendations for age, sex and risks. -Advised healthy diet and regular exercise -reviewed and advised all hm due -she wants to call to schedule mammo -advised assistant to order dexa -she declined shinrix, will consider -labs, studies and vaccines per orders this encounter  1. Essential hypertension -cont current tx - Basic metabolic panel - CBC  2. Hyperlipidemia, unspecified hyperlipidemia type -cont tx - Lipid panel  3. B12 deficiency -cont tx - Vitamin B12  4. Anemia, unspecified type -labs per orders  5. Carotid artery disease, unspecified laterality, unspecified type [facility]) -sees cardiology for managment    Patient advised to return to clinic immediately if symptoms worsen or persist or new  concerns.  There are no Patient Instructions on file for this visit.  No follow-ups on file.  Terressa Koyanagi, DO

## 2018-05-24 ENCOUNTER — Ambulatory Visit (INDEPENDENT_AMBULATORY_CARE_PROVIDER_SITE_OTHER): Payer: Medicare Other | Admitting: Family Medicine

## 2018-05-24 ENCOUNTER — Encounter: Payer: Self-pay | Admitting: Family Medicine

## 2018-05-24 VITALS — BP 128/80 | HR 58 | Temp 97.6°F | Ht 64.0 in | Wt 186.1 lb

## 2018-05-24 DIAGNOSIS — E785 Hyperlipidemia, unspecified: Secondary | ICD-10-CM

## 2018-05-24 DIAGNOSIS — E538 Deficiency of other specified B group vitamins: Secondary | ICD-10-CM | POA: Diagnosis not present

## 2018-05-24 DIAGNOSIS — Z23 Encounter for immunization: Secondary | ICD-10-CM

## 2018-05-24 DIAGNOSIS — Z0000 Encounter for general adult medical examination without abnormal findings: Secondary | ICD-10-CM | POA: Diagnosis not present

## 2018-05-24 DIAGNOSIS — I10 Essential (primary) hypertension: Secondary | ICD-10-CM

## 2018-05-24 DIAGNOSIS — I739 Peripheral vascular disease, unspecified: Secondary | ICD-10-CM

## 2018-05-24 DIAGNOSIS — E2839 Other primary ovarian failure: Secondary | ICD-10-CM

## 2018-05-24 DIAGNOSIS — I779 Disorder of arteries and arterioles, unspecified: Secondary | ICD-10-CM

## 2018-05-24 DIAGNOSIS — D649 Anemia, unspecified: Secondary | ICD-10-CM

## 2018-05-24 LAB — CBC
HCT: 38.3 % (ref 36.0–46.0)
HEMOGLOBIN: 12.7 g/dL (ref 12.0–15.0)
MCHC: 33.2 g/dL (ref 30.0–36.0)
MCV: 91.6 fl (ref 78.0–100.0)
Platelets: 261 10*3/uL (ref 150.0–400.0)
RBC: 4.18 Mil/uL (ref 3.87–5.11)
RDW: 14.2 % (ref 11.5–15.5)
WBC: 4.9 10*3/uL (ref 4.0–10.5)

## 2018-05-24 LAB — BASIC METABOLIC PANEL
BUN: 17 mg/dL (ref 6–23)
CALCIUM: 9.9 mg/dL (ref 8.4–10.5)
CO2: 31 mEq/L (ref 19–32)
CREATININE: 1.16 mg/dL (ref 0.40–1.20)
Chloride: 100 mEq/L (ref 96–112)
GFR: 53.96 mL/min — AB (ref >60.00)
GLUCOSE: 88 mg/dL (ref 70–99)
POTASSIUM: 4.1 meq/L (ref 3.5–5.1)
Sodium: 139 mEq/L (ref 135–145)

## 2018-05-24 LAB — LIPID PANEL
CHOL/HDL RATIO: 3
Cholesterol: 163 mg/dL (ref 0–200)
HDL: 65.2 mg/dL (ref >39.00)
LDL Cholesterol: 86 mg/dL (ref 0–99)
NonHDL: 97.8
TRIGLYCERIDES: 61 mg/dL (ref 0.0–149.0)
VLDL: 12.2 mg/dL (ref 0.0–40.0)

## 2018-05-24 LAB — VITAMIN B12: Vitamin B-12: 280 pg/mL (ref 211–911)

## 2018-05-24 LAB — HEMOGLOBIN A1C: HEMOGLOBIN A1C: 6.2 % (ref 4.6–6.5)

## 2018-05-24 NOTE — Addendum Note (Signed)
Addended by: Johnella MoloneyFUNDERBURK, JO A on: 05/24/2018 10:56 AM   Modules accepted: Orders

## 2018-05-24 NOTE — Addendum Note (Signed)
Addended by: Johnella Moloney on: 05/24/2018 10:55 AM   Modules accepted: Orders

## 2018-05-24 NOTE — Patient Instructions (Signed)
BEFORE YOU LEAVE: -tetanus booster -order DEXA -labs -follow up: 4 months  Please call to schedule your mammogram.  You can check on the coverage for the shingles vaccine (shinrix) with your insurance company if you decide to do it.  We have ordered labs or studies at this visit. It can take up to 1-2 weeks for results and processing. IF results require follow up or explanation, we will call you with instructions. Clinically stable results will be released to your [facility]. If you have not heard from [place] or cannot find your results in [facility] in 2 weeks please contact our office at [phone].  If you are not yet signed up for [facility], please consider signing up.    Preventive Care 38 Years and Older, Female Preventive care refers to lifestyle choices and visits with your health care provider that can promote health and wellness. What does preventive care include?  A yearly physical exam. This is also called an annual well check.  Dental exams once or twice a year.  Routine eye exams. Ask your health care provider how often you should have your eyes checked.  Personal lifestyle choices, including: ? Daily care of your teeth and gums. ? Regular physical activity. ? Eating a healthy diet. ? Avoiding tobacco and drug use. ? Limiting alcohol use. ? Practicing safe sex. ? Taking vitamin and mineral supplements as recommended by your health care provider. What happens during an annual well check? The services and screenings done by your health care provider during your annual well check will depend on your age, overall health, lifestyle risk factors, and family history of disease. Counseling Your health care provider may ask you questions about your:  Alcohol use.  Tobacco use.  Drug use.  Emotional well-being.  Home and relationship well-being.  Sexual activity.  Eating habits.  History of falls.  Memory and ability to understand (cognition).  Work and work  Statistician.  Reproductive health.  Screening You may have the following tests or measurements:  Height, weight, and BMI.  Blood pressure.  Lipid and cholesterol levels. These may be checked every 5 years, or more frequently if you are over 41 years old.  Skin check.  Lung cancer screening. You may have this screening every year starting at age 37 if you have a 30-pack-year history of smoking and currently smoke or have quit within the past 15 years.  Colorectal cancer screening. All adults should have this screening starting at age 43 and continuing until age 13. You will have tests every 1-10 years, depending on your results and the type of screening test. People at increased risk should start screening at an earlier age. Screening tests may include: ? Guaiac-based fecal occult blood testing. ? Fecal immunochemical test (FIT). ? Stool DNA test. ? Virtual colonoscopy. ? Sigmoidoscopy. During this test, a flexible tube with a tiny camera (sigmoidoscope) is used to examine your rectum and lower colon. The sigmoidoscope is inserted through your anus into your rectum and lower colon. ? Colonoscopy. During this test, a long, thin, flexible tube with a tiny camera (colonoscope) is used to examine your entire colon and rectum.  Hepatitis C blood test.  Hepatitis B blood test.  Sexually transmitted disease (STD) testing.  Diabetes screening. This is done by checking your blood sugar (glucose) after you have not eaten for a while (fasting). You may have this done every 1-3 years.  Bone density scan. This is done to screen for osteoporosis. You may have this done starting  at age 28.  Mammogram. This may be done every 1-2 years. Talk to your health care provider about how often you should have regular mammograms. Talk with your health care provider about your test results, treatment options, and if necessary, the need for more tests. Vaccines Your health care provider may recommend  certain vaccines, such as:  Influenza vaccine. This is recommended every year.  Tetanus, diphtheria, and acellular pertussis (Tdap, Td) vaccine. You may need a Td booster every 10 years.  Varicella vaccine. You may need this if you have not been vaccinated.  Zoster vaccine. You may need this after age 73.  Measles, mumps, and rubella (MMR) vaccine. You may need at least one dose of MMR if you were born in 1957 or later. You may also need a second dose.  Pneumococcal 13-valent conjugate (PCV13) vaccine. One dose is recommended after age 21.  Pneumococcal polysaccharide (PPSV23) vaccine. One dose is recommended after age 53.  Meningococcal vaccine. You may need this if you have certain conditions.  Hepatitis A vaccine. You may need this if you have certain conditions or if you travel or work in places where you may be exposed to hepatitis A.  Hepatitis B vaccine. You may need this if you have certain conditions or if you travel or work in places where you may be exposed to hepatitis B.  Haemophilus influenzae type b (Hib) vaccine. You may need this if you have certain conditions. Talk to your health care provider about which screenings and vaccines you need and how often you need them. This information is not intended to replace advice given to you by your health care provider. Make sure you discuss any questions you have with your health care provider. Document Released: 05/22/2015 Document Revised: 06/15/2017 Document Reviewed: 02/24/2015 Elsevier Interactive Patient Education  2019 [employer].

## 2018-06-21 ENCOUNTER — Emergency Department (HOSPITAL_COMMUNITY)
Admission: EM | Admit: 2018-06-21 | Discharge: 2018-06-21 | Discharge status: Against Medical Advice | Payer: Medicare Other | Attending: Emergency Medicine | Admitting: Emergency Medicine

## 2018-06-21 ENCOUNTER — Other Ambulatory Visit: Payer: Self-pay

## 2018-06-21 ENCOUNTER — Encounter (HOSPITAL_COMMUNITY): Payer: Self-pay

## 2018-06-21 ENCOUNTER — Emergency Department (HOSPITAL_COMMUNITY): Payer: Medicare Other

## 2018-06-21 DIAGNOSIS — R079 Chest pain, unspecified: Secondary | ICD-10-CM | POA: Insufficient documentation

## 2018-06-21 DIAGNOSIS — Z5321 Procedure and treatment not carried out due to patient leaving prior to being seen by health care provider: Secondary | ICD-10-CM | POA: Diagnosis not present

## 2018-06-21 DIAGNOSIS — N644 Mastodynia: Secondary | ICD-10-CM | POA: Diagnosis not present

## 2018-06-21 DIAGNOSIS — R072 Precordial pain: Secondary | ICD-10-CM | POA: Diagnosis not present

## 2018-06-21 LAB — POCT I-STAT TROPONIN I: Troponin i, poc: 0.01 ng/mL (ref 0.00–0.08)

## 2018-06-21 LAB — BASIC METABOLIC PANEL
Anion gap: 9 (ref 5–15)
BUN: 15 mg/dL (ref 8–23)
CO2: 27 mmol/L (ref 22–32)
Calcium: 9.6 mg/dL (ref 8.9–10.3)
Chloride: 101 mmol/L (ref 98–111)
Creatinine, Ser: 1 mg/dL (ref 0.44–1.00)
GFR calc Af Amer: >60 mL/min (ref >60)
GFR calc non Af Amer: 52 mL/min — ABNORMAL LOW (ref >60)
Glucose, Bld: 106 mg/dL — ABNORMAL HIGH (ref 70–99)
Potassium: 3.7 mmol/L (ref 3.5–5.1)
Sodium: 137 mmol/L (ref 135–145)

## 2018-06-21 LAB — CBC
HEMATOCRIT: 39.4 % (ref 36.0–46.0)
Hemoglobin: 12.4 g/dL (ref 12.0–15.0)
MCH: 30.1 pg (ref 26.0–34.0)
MCHC: 31.5 g/dL (ref 30.0–36.0)
MCV: 95.6 fL (ref 80.0–100.0)
Platelets: 252 10*3/uL (ref 150–400)
RBC: 4.12 MIL/uL (ref 3.87–5.11)
RDW: 14 % (ref 11.5–15.5)
WBC: 10.9 10*3/uL — ABNORMAL HIGH (ref 4.0–10.5)
nRBC: 0 % (ref 0.0–0.2)

## 2018-06-21 MED ORDER — SODIUM CHLORIDE 0.9% FLUSH: 3.0000 mL | Freq: Once | INTRAVENOUS | Status: DC

## 2018-06-21 NOTE — ED Triage Notes (Signed)
Pt states sharp pain in left breast at approximately 1600. Pt states pain lasted approximately 1 minute.  Pt states pain with movement is 10/10. Pt states it feels like someone is pulling on her chest

## 2018-07-06 ENCOUNTER — Ambulatory Visit: Payer: Medicare Other | Admitting: Cardiology

## 2018-07-06 DIAGNOSIS — R42 Dizziness and giddiness: Secondary | ICD-10-CM | POA: Insufficient documentation

## 2018-07-24 NOTE — Progress Notes (Deleted)
Cardiology Office Note   Date:  07/24/2018   ID:  Felicia, Cross 85 years old, MRN [id number]  PCP:  Terressa Koyanagi, DO  Cardiologist: Dr. Olga Millers, MD   No chief complaint on file.    History of Present Illness: Felicia Cross is a 85 y.o. female who presents for follow up of HTN, HLD and minimal CAD, seen for Dr. Jens Som.    Felicia Cross has a prior hx of HTN, HLD, and minimal CAD. She underwent a cardiac catheterization in 12/2014 which showed minor disease in the diagonal and distal LAD vessels. She also had carotid doppler performed in 06/2010 that showed 0-39% stenosis with follow up not deemed necessary. She has a stress echocardiogram 08/2013 that was normal. She was last seen by her primary cardiologist and was doing well without chest pain or other ACS or HF symptoms.   Today   1 coronary artery disease-minimal on previous evaluation.  Plan to continue medical therapy.  Continue aspirin and statin.  2 hypertension-blood pressure is controlled.  Continue present medications.  Renal function and potassium monitored by primary care.  3 hyperlipidemia-continue statin.  Lipids and liver monitored by primary care.  4 carotid artery disease-minimal on previous Dopplers.  Continue aspirin and statin.     Past Medical History:  Diagnosis Date  . Anemia 01/18/2016   -reports on and off her whole life, on oral iron in the past remotely per report  . Arthritis   . B12 deficiency 01/18/2016  . CAD (coronary artery disease)   . CVD (cerebrovascular disease)   . Diverticulosis of colon   . H. pylori infection 10/20/2015  . Hyperlipidemia   . Hypertension   . Osteoarthritis 03/07/2006   Qualifier: Diagnosis of  By: Cato Mulligan MD, Bruce      Past Surgical History:  Procedure Laterality Date  . ABDOMINAL HYSTERECTOMY  1984  . REPLACEMENT TOTAL KNEE  2007   left  . TOTAL HIP ARTHROPLASTY  2010   Dr. Turner Daniels, right  . TOTAL KNEE ARTHROPLASTY Right 09/14/2015   Procedure: TOTAL KNEE ARTHROPLASTY;  Surgeon: Dannielle Huh, MD;  Location: [facility] OR;  Service: Orthopedics;  Laterality: Right;     Current Outpatient Medications  Medication Sig Dispense Refill  . aspirin EC 81 MG tablet Take 1 tablet (81 mg total) by mouth daily.    . cyanocobalamin 500 MCG tablet Take 500 mcg by mouth daily.    Marland Kitchen dimenhyDRINATE (DRAMAMINE PO) Take by mouth. As needed for vertigo    . gabapentin (NEURONTIN) 100 MG capsule Take 1 capsule (100 mg total) by mouth at bedtime. 30 capsule 3  . hydrochlorothiazide (HYDRODIURIL) 25 MG tablet TAKE ONE TABLET BY MOUTH ONCE DAILY 90 tablet 3  . hydrochlorothiazide (HYDRODIURIL) 25 MG tablet hydrochlorothiazide 25 mg tablet    . losartan (COZAAR) 100 MG tablet TAKE 1 TABLET BY MOUTH ONCE DAILY 90 tablet 3  . meclizine (ANTIVERT) 25 MG tablet meclizine 25 mg tablet  TAKE 1 TABLET BY MOUTH THREE TIMES A DAY AS NEEDED DAILY    . metoprolol tartrate (LOPRESSOR) 25 MG tablet Take 1 tablet (25 mg total) by mouth 2 (two) times daily. 180 tablet 3  . metoprolol tartrate (LOPRESSOR) 25 MG tablet metoprolol tartrate 25 mg tablet    . Multiple Vitamin (MULTIVITAMIN WITH MINERALS) TABS Take 1 tablet by mouth daily.     . Multiple Vitamins-Minerals (MULTIVITAMIN ADULT EXTRA C PO) multivitamin    . simvastatin (ZOCOR) 40 MG tablet  Take 1 tablet (40 mg total) by mouth daily. 90 tablet 3  . simvastatin (ZOCOR) 40 MG tablet simvastatin 40 mg tablet    . traMADol (ULTRAM) 50 MG tablet TAKE ONE TO TWO TABLETS BY MOUTH EVERY 6 HOURS AS NEEDED FOR PAIN     No current facility-administered medications for this visit.     Allergies:   Codeine and Hydrocodone    Social History:  The patient  reports that she quit smoking about 37 years ago. She has a 30.00 pack-year smoking history. She has never used smokeless tobacco. She reports current alcohol use. She reports that she does not use drugs.   Family History:  The patient's ***family history includes  Diabetes in her mother; Heart attack in her brother and sister; Hypertension in her father; Kidney disease in her father.    ROS:  Please see the history of present illness.   Otherwise, review of systems are positive for {NONE DEFAULTED:18576::"none"}.   All other systems are reviewed and negative.    PHYSICAL EXAM: VS:  There were no vitals taken for this visit. , BMI There is no height or weight on file to calculate BMI.  General: Well developed, well nourished, NAD Skin: Warm, dry, intact  Head: Normocephalic, atraumatic, sclera non-icteric, no xanthomas, clear, moist mucus membranes. Neck: Negative for carotid bruits. No JVD Lungs:Clear to ausculation bilaterally. No wheezes, rales, or rhonchi. Breathing is unlabored. Cardiovascular: RRR with S1 S2. No murmurs, rubs, gallops, or LV heave appreciated. Abdomen: Soft, non-tender, non-distended with normoactive bowel sounds. No hepatomegaly, No rebound/guarding. No obvious abdominal masses. MSK: Strength and tone appear normal for 85 5/5 in all extremities Extremities: No edema. No clubbing or cyanosis. DP/PT pulses 2+ bilaterally Neuro: Alert and oriented. No focal deficits. No facial asymmetry. MAE spontaneously. Psych: Responds to questions appropriately with normal affect.        EKG:  EKG {ACTION; IS/IS [id number]:[id number]} ordered today. The ekg ordered today demonstrates ***   Recent Labs: 06/21/2018: BUN 15; Creatinine, Ser 1.00; Hemoglobin 12.4; Platelets 252; Potassium 3.7; Sodium 137    Lipid Panel    Component Value Date/Time   CHOL 163 05/24/2018 1101   TRIG 61.0 05/24/2018 1101   HDL 65.20 05/24/2018 1101   CHOLHDL 3 05/24/2018 1101   VLDL 12.2 05/24/2018 1101   LDLCALC 86 05/24/2018 1101   LDLDIRECT 138.1 05/19/2011 1055      Wt Readings from Last 3 Encounters:  06/21/18 185 lb 3 oz (84 kg)  05/24/18 186 lb 1.6 oz (84.4 kg)  04/10/18 186 lb (84.4 kg)      Other studies Reviewed: Additional studies/  records that were reviewed today include:   Stress echocardiogram 08/16/2013:  Study Conclusions  - Stress ECG conclusions: There were no stress arrhythmias or conduction abnormalities. The stress ECG was negative for ischemia. [facility] scoring: exercise time of 5.57min; maximum ST deviation of 68mm; no angina; resulting score is 6. This score predicts a low risk of cardiac events. - Staged echo: There was no echocardiographic evidence for stress-induced ischemia. Bruce protocol. Stress echocardiography. Height: Height: 162.6cm. Height: 64in. Weight: Weight: 76.8kg. Weight: 169lb. Body mass index: BMI: 29.1kg/m^2. Body surface area:  BSA: 1.102m^2. Blood pressure:   144/90. Patient status: Outpatient. Location: [facility]  Cardiac catheterization 12/2004:  CONCLUSIONS:  1.  Well-preserved overall left ventricular function.  2.  Minor irregularity of the first diagonal and mid distal LAD, without      evidence of high-grade focal obstruction.  RECOMMENDATIONS:  The patient will be treated medically. She will have  follow-up with Dr. Andee Lineman and Dr. Cato Mulligan.  Based on the angiographic  studies, I doubt that there is a direct correlation between the angiographic  findings in the radionuclide images; although this could not be entirely  excluded.  Medical therapy would be the best option.   ASSESSMENT AND PLAN:  1.  ***   Current medicines are reviewed at length with the patient today.  The patient {ACTIONS; HAS/DOES NOT HAVE:19233} concerns regarding medicines.  The following changes have been made:  {PLAN; NO CHANGE:13088:s}  Labs/ tests ordered today include: *** No orders of the defined types were placed in this encounter.    Disposition:   FU with *** in {gen number 3-14:970263} {Days to years:10300}  Signed, Georgie Chard, NP  07/24/2018 11:01 AM    [facility] [address] [place], [place], [place]  [address] Phone: [phone]; Fax: [phone]

## 2018-07-25 ENCOUNTER — Ambulatory Visit: Payer: Medicare Other | Admitting: Cardiology

## 2018-07-25 NOTE — Progress Notes (Deleted)
Cardiology Office Note   Date:  07/25/2018   ID:  Shantai, Shobe 04-14-1933, MRN [id number]  PCP:  Terressa Koyanagi, DO  Cardiologist:  Dr. Jens Som, MD   No chief complaint on file.   History of Present Illness: Felicia Cross is a 85 y.o. female who presents for ***    seen for Dr. Jens Som.    Felicia Cross has a prior hx of hypertension, hyperlipidemia and minimal CAD. She had a previous catheterization in 12/2004 that showed minor disease in the diagonal and distal LAD. She also underwent carotid dopplers in 06/2010 that showed 0-39% stenosis in which follow up was not deemed necessary as this had been stable over serial exams. Stress echocardiogram performed 08/2013 was found to be normal.She was last seen by her primary cardiologist 09/2017 in follow up and was doing well. SHe had no c/o chest pain or other ACS/HF symptoms.   Today   1 coronary artery disease-minimal on previous evaluation.  Plan to continue medical therapy.  Continue aspirin and statin.  2 hypertension-blood pressure is controlled.  Continue present medications.  Renal function and potassium monitored by primary care.  3 hyperlipidemia-continue statin.  Lipids and liver monitored by primary care.  4 carotid artery disease-minimal on previous Dopplers.  Continue aspirin and statin.   Past Medical History:  Diagnosis Date  . Anemia 01/18/2016   -reports on and off her whole life, on oral iron in the past remotely per report  . Arthritis   . B12 deficiency 01/18/2016  . CAD (coronary artery disease)   . CVD (cerebrovascular disease)   . Diverticulosis of colon   . H. pylori infection 10/20/2015  . Hyperlipidemia   . Hypertension   . Osteoarthritis 03/07/2006   Qualifier: Diagnosis of  By: Cato Mulligan MD, Bruce      Past Surgical History:  Procedure Laterality Date  . ABDOMINAL HYSTERECTOMY  1984  . REPLACEMENT TOTAL KNEE  2007   left  . TOTAL HIP ARTHROPLASTY  2010   Dr. Turner Daniels, right  .  TOTAL KNEE ARTHROPLASTY Right 09/14/2015   Procedure: TOTAL KNEE ARTHROPLASTY;  Surgeon: Dannielle Huh, MD;  Location: [facility] OR;  Service: Orthopedics;  Laterality: Right;     Current Outpatient Medications  Medication Sig Dispense Refill  . aspirin EC 81 MG tablet Take 1 tablet (81 mg total) by mouth daily.    . cyanocobalamin 500 MCG tablet Take 500 mcg by mouth daily.    Marland Kitchen dimenhyDRINATE (DRAMAMINE PO) Take by mouth. As needed for vertigo    . gabapentin (NEURONTIN) 100 MG capsule Take 1 capsule (100 mg total) by mouth at bedtime. 30 capsule 3  . hydrochlorothiazide (HYDRODIURIL) 25 MG tablet TAKE ONE TABLET BY MOUTH ONCE DAILY 90 tablet 3  . hydrochlorothiazide (HYDRODIURIL) 25 MG tablet hydrochlorothiazide 25 mg tablet    . losartan (COZAAR) 100 MG tablet TAKE 1 TABLET BY MOUTH ONCE DAILY 90 tablet 3  . meclizine (ANTIVERT) 25 MG tablet meclizine 25 mg tablet  TAKE 1 TABLET BY MOUTH THREE TIMES A DAY AS NEEDED DAILY    . metoprolol tartrate (LOPRESSOR) 25 MG tablet Take 1 tablet (25 mg total) by mouth 2 (two) times daily. 180 tablet 3  . metoprolol tartrate (LOPRESSOR) 25 MG tablet metoprolol tartrate 25 mg tablet    . Multiple Vitamin (MULTIVITAMIN WITH MINERALS) TABS Take 1 tablet by mouth daily.     . Multiple Vitamins-Minerals (MULTIVITAMIN ADULT EXTRA C PO) multivitamin    .  simvastatin (ZOCOR) 40 MG tablet Take 1 tablet (40 mg total) by mouth daily. 90 tablet 3  . simvastatin (ZOCOR) 40 MG tablet simvastatin 40 mg tablet    . traMADol (ULTRAM) 50 MG tablet TAKE ONE TO TWO TABLETS BY MOUTH EVERY 6 HOURS AS NEEDED FOR PAIN     No current facility-administered medications for this visit.     Allergies:   Codeine and Hydrocodone    Social History:  The patient  reports that she quit smoking about 37 years ago. She has a 30.00 pack-year smoking history. She has never used smokeless tobacco. She reports current alcohol use. She reports that she does not use drugs.   Family History:  The  patient's family history includes Diabetes in her mother; Heart attack in her brother and sister; Hypertension in her father; Kidney disease in her father.    ROS:  Please see the history of present illness.   Otherwise, review of systems are positive for {NONE DEFAULTED:18576::"none"}. All other systems are reviewed and negative.    PHYSICAL EXAM: VS:  There were no vitals taken for this visit. , BMI There is no height or weight on file to calculate BMI.   General: Well developed, well nourished, NAD Skin: Warm, dry, intact  Head: Normocephalic, atraumatic, sclera non-icteric, no xanthomas, clear, moist mucus membranes. Neck: Negative for carotid bruits. No JVD Lungs:Clear to ausculation bilaterally. No wheezes, rales, or rhonchi. Breathing is unlabored. Cardiovascular: RRR with S1 S2. No murmurs, rubs, gallops, or LV heave appreciated. Abdomen: Soft, non-tender, non-distended with normoactive bowel sounds. No hepatomegaly, No rebound/guarding. No obvious abdominal masses. MSK: Strength and tone appear normal for age. 5/5 in all extremities Extremities: No edema. No clubbing or cyanosis. DP/PT pulses 2+ bilaterally Neuro: Alert and oriented. No focal deficits. No facial asymmetry. MAE spontaneously. Psych: Responds to questions appropriately with normal affect.     EKG:  EKG {ACTION; IS/IS UJW:[id number]} ordered today. The ekg ordered today demonstrates ***   Recent Labs: 06/21/2018: BUN 15; Creatinine, Ser 1.00; Hemoglobin 12.4; Platelets 252; Potassium 3.7; Sodium 137    Lipid Panel    Component Value Date/Time   CHOL 163 05/24/2018 1101   TRIG 61.0 05/24/2018 1101   HDL 65.20 05/24/2018 1101   CHOLHDL 3 05/24/2018 1101   VLDL 12.2 05/24/2018 1101   LDLCALC 86 05/24/2018 1101   LDLDIRECT 138.1 05/19/2011 1055      Wt Readings from Last 3 Encounters:  06/21/18 185 lb 3 oz (84 kg)  05/24/18 186 lb 1.6 oz (84.4 kg)  04/10/18 186 lb (84.4 kg)      Other studies  Reviewed: Additional studies/ records that were reviewed today include:   Stress echocardiogram: Study Conclusions  - Stress ECG conclusions: There were no stress arrhythmias or conduction abnormalities. The stress ECG was negative for ischemia. Duke scoring: exercise time of 5.81min; maximum ST deviation of 0mm; no angina; resulting score is 6. This score predicts a low risk of cardiac events. - Staged echo: There was no echocardiographic evidence for stress-induced ischemia. Bruce protocol. Stress echocardiography. Height: Height: 162.6cm. Height: 64in. Weight: Weight: 76.8kg. Weight: 169lb. Body mass index: BMI: 29.1kg/m^2. Body surface area:  BSA: 1.22m^2. Blood pressure:   144/90. Patient status: Outpatient. Location: [facility]    ASSESSMENT AND PLAN:  1.  ***   Current medicines are reviewed at length with the patient today.  The patient {ACTIONS; HAS/DOES NOT HAVE:19233} concerns regarding medicines.  The following changes have been made:  {  PLAN; NO CHANGE:13088:s}  Labs/ tests ordered today include: *** No orders of the defined types were placed in this encounter.    Disposition:   FU with *** in {gen number 4-19:622297} {Days to years:10300}  Signed, Georgie Chard, NP  07/25/2018 8:00 AM    [facility] [address] [place], [place], [place]  [address] Phone: [phone]; Fax: [phone]

## 2018-08-07 ENCOUNTER — Telehealth: Payer: Self-pay | Admitting: Family Medicine

## 2018-08-07 NOTE — Telephone Encounter (Signed)
Copied from CRM [id number]. Topic: General - Other >> Aug 07, 2018  2:37 PM Felicia Cross wrote: Reason for CRM: Patient called to speak with Dr. Elmyra Ricks nurse regarding a referral.  Patient did not elaborate but said she would like a call back at [phone]

## 2018-08-08 NOTE — Telephone Encounter (Signed)
I called the pt and scheduled a transfer of care visit with Dr Salomon Fick on 5/18.

## 2018-09-24 ENCOUNTER — Other Ambulatory Visit: Payer: Self-pay

## 2018-09-24 ENCOUNTER — Ambulatory Visit: Payer: Medicare Other | Admitting: Family Medicine

## 2018-09-24 ENCOUNTER — Encounter: Payer: Medicare Other | Admitting: Family Medicine

## 2018-09-24 ENCOUNTER — Encounter: Payer: Self-pay | Admitting: Family Medicine

## 2018-09-24 ENCOUNTER — Ambulatory Visit (INDEPENDENT_AMBULATORY_CARE_PROVIDER_SITE_OTHER): Payer: Medicare Other | Admitting: Family Medicine

## 2018-09-24 DIAGNOSIS — Z862 Personal history of diseases of the blood and blood-forming organs and certain disorders involving the immune mechanism: Secondary | ICD-10-CM

## 2018-09-24 DIAGNOSIS — Z87898 Personal history of other specified conditions: Secondary | ICD-10-CM

## 2018-09-24 DIAGNOSIS — I6523 Occlusion and stenosis of bilateral carotid arteries: Secondary | ICD-10-CM | POA: Diagnosis not present

## 2018-09-24 DIAGNOSIS — E782 Mixed hyperlipidemia: Secondary | ICD-10-CM

## 2018-09-24 DIAGNOSIS — I10 Essential (primary) hypertension: Secondary | ICD-10-CM | POA: Diagnosis not present

## 2018-09-24 DIAGNOSIS — M1990 Unspecified osteoarthritis, unspecified site: Secondary | ICD-10-CM

## 2018-09-24 MED ORDER — HYDROCHLOROTHIAZIDE 25 MG PO TABS: 25.0000 mg | ORAL_TABLET | Freq: Every day | ORAL | 3 refills | Status: DC

## 2018-09-24 NOTE — Progress Notes (Signed)
Virtual Visit via Video Note Able to see pt, but pt nor her grandson could get pt's audio to work.  Pt then called via phone for visit.   I connected with Felicia Cross on 09/24/18 at  2:00 PM EDT by a video enabled telemedicine application and verified that I am speaking with the correct person using two identifiers.  Location patient: home Location provider:work or home office Persons participating in the virtual visit: patient, provider  I discussed the limitations of evaluation and management by telemedicine and the availability of in person appointments. The patient expressed understanding and agreed to proceed.   HPI: Pt following up on chronic conditions and TOC, previously seen by Dr. Selena BattenKim.  HTN:  Taking HCTZ 25 mg, losartan 100 mg, and Metoprolol 25 mg BID.  Not checking at home.  Has a cuff, but forgets to check it.  States was 128/87 the last time she checked it.  Has improved since retiring.  States her granddaughter cooks healthy, does not use salt.  Eating mostly chicken and fish.  Walks daily.   HLD: on simvistatin 40 mg.  Denies myalgias.  CAD:  Seen by [facility], Dr. Jens Somrenshaw. On Metoprolol 25 mg BID, ASA 81 mg.  States may have issues with her legs bothering her at night.  Takes gabapentin 100 mg qhs.  Anemia: not really taking iron.  States energy is good.  Walks daily.  OA s/p b/l TKR.  Had rotator cuff repair.  Followed by Ortho.  H/o Vertigo:  Has some issues at the beginning of the year.  States typically goes away.  Does not like to take a lot of meds.  Keeps dramamine.  Pt states she typically gets a mammogram and pap smear done regularly.  ROS: See pertinent positives and negatives per HPI.  Past Medical History:  Diagnosis Date  . Anemia 01/18/2016   -reports on and off her whole life, on oral iron in the past remotely per report  . Arthritis   . B12 deficiency 01/18/2016  . CAD (coronary artery disease)   . CVD (cerebrovascular disease)   .  Diverticulosis of colon   . H. pylori infection 10/20/2015  . Hyperlipidemia   . Hypertension   . Osteoarthritis 03/07/2006   Qualifier: Diagnosis of  By: Cato MulliganSwords MD, Bruce      Past Surgical History:  Procedure Laterality Date  . ABDOMINAL HYSTERECTOMY  1984  . REPLACEMENT TOTAL KNEE  2007   left  . TOTAL HIP ARTHROPLASTY  2010   Dr. Turner Danielsowan, right  . TOTAL KNEE ARTHROPLASTY Right 09/14/2015   Procedure: TOTAL KNEE ARTHROPLASTY;  Surgeon: Dannielle HuhSteve Lucey, MD;  Location: [facility] OR;  Service: Orthopedics;  Laterality: Right;    Family History  Problem Relation Age of Onset  . Diabetes Mother   . Hypertension Father   . Kidney disease Father   . Heart attack Sister   . Heart attack Brother   . Colon cancer Neg Hx   . Stomach cancer Neg Hx   . Esophageal cancer Neg Hx   . Rectal cancer Neg Hx     SOCIAL HX: Pt attends [employer], where she is an active member.   Current Outpatient Medications:  .  aspirin EC 81 MG tablet, Take 1 tablet (81 mg total) by mouth daily., Disp: , Rfl:  .  cyanocobalamin 500 MCG tablet, Take 500 mcg by mouth daily., Disp: , Rfl:  .  dimenhyDRINATE (DRAMAMINE PO), Take by mouth. As needed for vertigo,  Disp: , Rfl:  .  gabapentin (NEURONTIN) 100 MG capsule, Take 1 capsule (100 mg total) by mouth at bedtime., Disp: 30 capsule, Rfl: 3 .  hydrochlorothiazide (HYDRODIURIL) 25 MG tablet, TAKE ONE TABLET BY MOUTH ONCE DAILY, Disp: 90 tablet, Rfl: 3 .  hydrochlorothiazide (HYDRODIURIL) 25 MG tablet, hydrochlorothiazide 25 mg tablet, Disp: , Rfl:  .  losartan (COZAAR) 100 MG tablet, TAKE 1 TABLET BY MOUTH ONCE DAILY, Disp: 90 tablet, Rfl: 3 .  meclizine (ANTIVERT) 25 MG tablet, meclizine 25 mg tablet  TAKE 1 TABLET BY MOUTH THREE TIMES A DAY AS NEEDED DAILY, Disp: , Rfl:  .  metoprolol tartrate (LOPRESSOR) 25 MG tablet, Take 1 tablet (25 mg total) by mouth 2 (two) times daily., Disp: 180 tablet, Rfl: 3 .  metoprolol tartrate (LOPRESSOR) 25 MG tablet, metoprolol  tartrate 25 mg tablet, Disp: , Rfl:  .  Multiple Vitamin (MULTIVITAMIN WITH MINERALS) TABS, Take 1 tablet by mouth daily. , Disp: , Rfl:  .  Multiple Vitamins-Minerals (MULTIVITAMIN ADULT EXTRA C PO), multivitamin, Disp: , Rfl:  .  simvastatin (ZOCOR) 40 MG tablet, Take 1 tablet (40 mg total) by mouth daily., Disp: 90 tablet, Rfl: 3 .  simvastatin (ZOCOR) 40 MG tablet, simvastatin 40 mg tablet, Disp: , Rfl:  .  traMADol (ULTRAM) 50 MG tablet, TAKE ONE TO TWO TABLETS BY MOUTH EVERY 6 HOURS AS NEEDED FOR PAIN, Disp: , Rfl:   EXAM:  VITALS per patient if applicable: RR between 12-20 bpm  GENERAL: alert, oriented, appears well and in no acute distress  HEENT: atraumatic, conjunctiva clear, no obvious abnormalities on inspection of external nose and ears  NECK: normal movements of the head and neck  LUNGS: on inspection no signs of respiratory distress, breathing rate appears normal, no obvious gross SOB, gasping or wheezing  CV: no obvious cyanosis  MS: moves all visible extremities without noticeable abnormality  PSYCH/NEURO: pleasant and cooperative, no obvious depression or anxiety, speech and thought processing grossly intact  ASSESSMENT AND PLAN:  Discussed the following assessment and plan:  Essential hypertension  -controlled -continue current medications -continue lifestyle modifications. - Plan: hydrochlorothiazide (HYDRODIURIL) 25 MG tablet  Bilateral carotid artery stenosis -continue metoprolol 25 mg BID, simvastatin 40 mg, ASA 81 mg -continue f/u with cardiology  Osteoarthritis, unspecified osteoarthritis type, unspecified site -continue f/u with Ortho  H/O vertigo  H/O iron deficiency anemia  Mixed hyperlipidemia -continue simvastatin  F/u prn in the next 3-6 months   I discussed the assessment and treatment plan with the patient. The patient was provided an opportunity to ask questions and all were answered. The patient agreed with the plan and  demonstrated an understanding of the instructions.   The patient was advised to call back or seek an in-person evaluation if the symptoms worsen or if the condition fails to improve as anticipated.  I provided 16 minutes of non-face-to-face time during this encounter.   Deeann Saint, MD

## 2018-10-05 ENCOUNTER — Encounter: Payer: Self-pay | Admitting: *Deleted

## 2018-10-05 NOTE — Telephone Encounter (Signed)
F/U Message           Patient is returning Debra's M call, would like a call back

## 2018-10-05 NOTE — Telephone Encounter (Signed)
Left message for pt to call to discuss 10-22-18 appt with crenshaw

## 2018-10-05 NOTE — Telephone Encounter (Signed)
Left message for pt to call.

## 2018-10-08 NOTE — Telephone Encounter (Signed)
Left message for pt to call.

## 2018-10-08 NOTE — Telephone Encounter (Signed)
This encounter was created in error - please disregard.

## 2018-10-15 ENCOUNTER — Other Ambulatory Visit: Payer: Self-pay | Admitting: Neurology

## 2018-10-16 ENCOUNTER — Telehealth: Payer: Self-pay | Admitting: Cardiology

## 2018-10-16 NOTE — Telephone Encounter (Signed)
call home phone/ consent/ my chart declined/ pre reg completed °

## 2018-10-17 NOTE — Telephone Encounter (Signed)
Follow up  ° ° °Pt is returning call  ° ° °Please call back  °

## 2018-10-18 NOTE — Telephone Encounter (Signed)
I was not aware that she had transferred to Dr. Volanda Napoleon, but it appears she has. Will forward to her. Thanks Larene Beach!

## 2018-10-22 ENCOUNTER — Telehealth (INDEPENDENT_AMBULATORY_CARE_PROVIDER_SITE_OTHER): Payer: Medicare Other | Admitting: Cardiology

## 2018-10-22 VITALS — BP 128/72 | Temp 98.0°F | Ht 63.0 in | Wt 193.0 lb

## 2018-10-22 DIAGNOSIS — I2510 Atherosclerotic heart disease of native coronary artery without angina pectoris: Secondary | ICD-10-CM | POA: Diagnosis not present

## 2018-10-22 DIAGNOSIS — I10 Essential (primary) hypertension: Secondary | ICD-10-CM

## 2018-10-22 DIAGNOSIS — I119 Hypertensive heart disease without heart failure: Secondary | ICD-10-CM

## 2018-10-22 DIAGNOSIS — E7800 Pure hypercholesterolemia, unspecified: Secondary | ICD-10-CM

## 2018-10-22 MED ORDER — SIMVASTATIN 40 MG PO TABS: 40.0000 mg | ORAL_TABLET | Freq: Every day | ORAL | 3 refills | Status: DC

## 2018-10-22 MED ORDER — METOPROLOL TARTRATE 25 MG PO TABS: 25.0000 mg | ORAL_TABLET | Freq: Two times a day (BID) | ORAL | 3 refills | Status: DC

## 2018-10-22 NOTE — Progress Notes (Signed)
Virtual Visit via Video Note   This visit type was conducted due to national recommendations for restrictions regarding the COVID-19 Pandemic (e.g. social distancing) in an effort to limit this patient's exposure and mitigate transmission in our community.  Due to her co-morbid illnesses, this patient is at least at moderate risk for complications without adequate follow up.  This format is felt to be most appropriate for this patient at this time.  All issues noted in this document were discussed and addressed.  A limited physical exam was performed with this format.  Please refer to the patient's chart for her consent to telehealth for [facility].   Date:  10/22/2018   ID:  Felicia Cross, DOB 1932-09-28, MRN [id number]  Patient Location: Home Provider Location: Home  PCP:  Terressa KoyanagiKim, Hannah R, DO  Cardiologist:  Dr Alphonsa Ginrenshw  Evaluation Performed:  Follow-Up Visit  Chief Complaint:  FU hypertension and hyperlipidemia  History of Present Illness:    FU hypertension, hyperlipidemia and minimal CAD. She had a previous catheterization in August 2006 that showed minor disease in the diagonal and distal LAD. We also performed carotid Dopplers in Feb 2012 that showed 0-39% stenosis. F/U not deemed necessary as this had been stable over serial exams. Stress echo 4/15 normal.Since last seen,the patient denies any dyspnea on exertion, orthopnea, PND, pedal edema, palpitations, syncope or chest pain.   The patient does not have symptoms concerning for COVID-19 infection (fever, chills, cough, or new shortness of breath).    Past Medical History:  Diagnosis Date  . Anemia 01/18/2016   -reports on and off her whole life, on oral iron in the past remotely per report  . Arthritis   . B12 deficiency 01/18/2016  . CAD (coronary artery disease)   . CVD (cerebrovascular disease)   . Diverticulosis of colon   . H. pylori infection 10/20/2015  . Hyperlipidemia   . Hypertension   . Osteoarthritis  03/07/2006   Qualifier: Diagnosis of  By: Cato MulliganSwords MD, Bruce     Past Surgical History:  Procedure Laterality Date  . ABDOMINAL HYSTERECTOMY  1984  . REPLACEMENT TOTAL KNEE  2007   left  . TOTAL HIP ARTHROPLASTY  2010   Dr. Turner Danielsowan, right  . TOTAL KNEE ARTHROPLASTY Right 09/14/2015   Procedure: TOTAL KNEE ARTHROPLASTY;  Surgeon: Dannielle HuhSteve Lucey, MD;  Location: [facility] OR;  Service: Orthopedics;  Laterality: Right;     Current Meds  Medication Sig  . aspirin EC 81 MG tablet Take 1 tablet (81 mg total) by mouth daily.  . cyanocobalamin 500 MCG tablet Take 500 mcg by mouth daily.  Marland Kitchen. dimenhyDRINATE (DRAMAMINE PO) Take by mouth. As needed for vertigo  . gabapentin (NEURONTIN) 100 MG capsule Take 1 capsule by mouth at bedtime  . hydrochlorothiazide (HYDRODIURIL) 25 MG tablet Take 1 tablet (25 mg total) by mouth daily.  Marland Kitchen. losartan (COZAAR) 100 MG tablet TAKE 1 TABLET BY MOUTH ONCE DAILY  . meclizine (ANTIVERT) 25 MG tablet meclizine 25 mg tablet  TAKE 1 TABLET BY MOUTH THREE TIMES A DAY AS NEEDED DAILY  . metoprolol tartrate (LOPRESSOR) 25 MG tablet Take 1 tablet (25 mg total) by mouth 2 (two) times daily.  . Multiple Vitamin (MULTIVITAMIN WITH MINERALS) TABS Take 1 tablet by mouth daily.   . Multiple Vitamins-Minerals (MULTIVITAMIN ADULT EXTRA C PO) multivitamin  . simvastatin (ZOCOR) 40 MG tablet Take 1 tablet (40 mg total) by mouth daily.  . traMADol (ULTRAM) 50 MG tablet TAKE ONE TO  TWO TABLETS BY MOUTH EVERY 6 HOURS AS NEEDED FOR PAIN     Allergies:   Codeine and Hydrocodone   Social History   Tobacco Use  . Smoking status: Former Smoker    Packs/day: 1.00    Years: 30.00    Pack years: 30.00    Quit date: 11/01/1980    Years since quitting: 37.9  . Smokeless tobacco: Never Used  Substance Use Topics  . Alcohol use: Yes    Alcohol/week: 0.0 standard drinks    Comment: glass of wine  . Drug use: No     Family Hx: The patient's family history includes Diabetes in her mother; Heart  attack in her brother and sister; Hypertension in her father; Kidney disease in her father. There is no history of Colon cancer, Stomach cancer, Esophageal cancer, or Rectal cancer.  ROS:   Please see the history of present illness.    No fevers, chills or productive cough. All other systems reviewed and are negative.    Recent Labs: 06/21/2018: BUN 15; Creatinine, Ser 1.00; Hemoglobin 12.4; Platelets 252; Potassium 3.7; Sodium 137   Recent Lipid Panel Lab Results  Component Value Date/Time   CHOL 163 05/24/2018 11:01 AM   TRIG 61.0 05/24/2018 11:01 AM   HDL 65.20 05/24/2018 11:01 AM   CHOLHDL 3 05/24/2018 11:01 AM   LDLCALC 86 05/24/2018 11:01 AM   LDLDIRECT 138.1 05/19/2011 10:55 AM    Wt Readings from Last 3 Encounters:  10/22/18 193 lb (87.5 kg)  06/21/18 185 lb 3 oz (84 kg)  05/24/18 186 lb 1.6 oz (84.4 kg)     Objective:    Vital Signs:  BP 128/72   Temp 98 F (36.7 C)   Ht 5\' 3"  (1.6 m)   Wt 193 lb (87.5 kg)   BMI 34.19 kg/m    VITAL SIGNS:  reviewed  No acute distress Answers questions appropriately Normal affect Remainder of physical examination not performed (telehealth visit; coronavirus pandemic)  ASSESSMENT & PLAN:    1. Coronary artery disease-nonobstructive on most recent evaluation.  Patient not having chest pain.  Continue medical therapy with aspirin and statin. 2. Hypertension-blood pressure is controlled.  Continue present medications and follow. 3. Hyperlipidemia-continue statin.  Check lipids and liver.   4. Carotid artery disease-mild on most recent examination.  Continue medical therapy with aspirin and statin.  COVID-19 Education: The importance of social distancing was discussed today.  Time:   Today, I have spent 20 minutes with the patient with telehealth technology discussing the above problems.     Medication Adjustments/Labs and Tests Ordered: Current medicines are reviewed at length with the patient today.  Concerns regarding  medicines are outlined above.   Tests Ordered: No orders of the defined types were placed in this encounter.   Medication Changes: No orders of the defined types were placed in this encounter.   Follow Up:  Virtual Visit or In Person in 1 year(s)  Signed, Kirk Ruths, MD  10/22/2018 9:19 AM    [place]

## 2018-10-22 NOTE — Patient Instructions (Signed)
Medication Instructions:  Refill sent to the pharmacy electronically.  If you need a refill on your cardiac medications before your next appointment, please call your pharmacy.   Lab work: Your physician recommends that you return for lab work AT [place] If you have labs (blood work) drawn today and your tests are completely normal, you will receive your results only by: Marland Kitchen MyChart Message (if you have MyChart) OR . A paper copy in the mail If you have any lab test that is abnormal or we need to change your treatment, we will call you to review the results.  Follow-Up: At [facility], you and your health needs are our priority.  As part of our continuing mission to provide you with exceptional heart care, we have created designated Provider Care Teams.  These Care Teams include your primary Cardiologist (physician) and Advanced Practice Providers (APPs -  Physician Assistants and Nurse Practitioners) who all work together to provide you with the care you need, when you need it. You will need a follow up appointment in 12 months.  Please call our office 2 months in advance to schedule this appointment.  You may see Kirk Ruths MD or one of the following Advanced Practice Providers on your designated Care Team:   Kerin Ransom, PA-C Roby Lofts, [place] . Sande Rives, PA-C

## 2018-11-05 DIAGNOSIS — E7800 Pure hypercholesterolemia, unspecified: Secondary | ICD-10-CM | POA: Diagnosis not present

## 2018-11-06 ENCOUNTER — Encounter: Payer: Self-pay | Admitting: *Deleted

## 2018-11-06 LAB — LIPID PANEL
Chol/HDL Ratio: 2.5 ratio (ref 0.0–4.4)
Cholesterol, Total: 180 mg/dL (ref 100–199)
HDL: 72 mg/dL (ref >39)
LDL Calculated: 95 mg/dL (ref 0–99)
Triglycerides: 64 mg/dL (ref 0–149)
VLDL Cholesterol Cal: 13 mg/dL (ref 5–40)

## 2018-11-06 LAB — HEPATIC FUNCTION PANEL
ALT: 16 IU/L (ref 0–32)
AST: 26 IU/L (ref 0–40)
Albumin: 4.1 g/dL (ref 3.6–4.6)
Alkaline Phosphatase: 50 IU/L (ref 39–117)
Bilirubin Total: 0.3 mg/dL (ref 0.0–1.2)
Bilirubin, Direct: 0.09 mg/dL (ref 0.00–0.40)
Total Protein: 6.5 g/dL (ref 6.0–8.5)

## 2018-11-08 ENCOUNTER — Ambulatory Visit: Payer: Medicare Other

## 2018-12-31 ENCOUNTER — Other Ambulatory Visit: Payer: Self-pay

## 2018-12-31 NOTE — Patient Outreach (Signed)
[facility]) Care Management  12/31/2018  ATZIRY BARANSKI [birth year] [id number]   Medication Adherence call to Mrs. Levonne Spiller HIPPA Compliant Voice message left with a call back number. Mrs. Willits is showing past due under [place].   [place] Management Direct Dial [phone]  Fax [phone] [email or web address]

## 2019-02-06 ENCOUNTER — Other Ambulatory Visit: Payer: Self-pay

## 2019-02-06 NOTE — Patient Outreach (Signed)
[facility] [facility]) Care Management  02/06/2019  Felicia Cross 1933-01-25 [id number]   Medication Adherence call to Felicia Cross HIPPA Compliant Voice message left with a call back number. Felicia Cross is showing past due on Simvastatin 40 mg under [place].   [place] Management Direct Dial [phone]  Fax [phone] [email or web address]

## 2019-02-14 ENCOUNTER — Other Ambulatory Visit: Payer: Self-pay

## 2019-02-14 NOTE — Patient Outreach (Signed)
[place]) Care Management  02/14/2019  Felicia Cross 03-Jan-1933 [id number]   Medication Adherence call to Mrs. Felicia Cross [place] Identifiers Verify spoke with patient she is past due on Simvastatin 40 mg patient explain she takes 1 tablet daily she miss 4 days when she when out town but patient has enough for two month patient will order when due. Felicia Cross is showing past due under [place].   [place] Management Direct Dial [phone]  Fax [phone] [email or web address]

## 2019-04-09 DIAGNOSIS — Z1231 Encounter for screening mammogram for malignant neoplasm of breast: Secondary | ICD-10-CM | POA: Diagnosis not present

## 2019-04-12 ENCOUNTER — Telehealth: Payer: Self-pay | Admitting: Family Medicine

## 2019-04-12 NOTE — Telephone Encounter (Signed)
Pt would like to come in the office to be seen by her own PCP for a L wrist w/shoulder pain as well.  Pt was offered a virtual appointment and to see another provider and she declined.  Pt would like to have a call back [phone] to let her know if she can come in on  Wednesday 04/17/2019 in the afternoon and pt is aware that her provider only have virtual appointments for the rest of the year.

## 2019-04-12 NOTE — Telephone Encounter (Signed)
Ok to schedule in office appointment for 1 pm

## 2019-04-16 NOTE — Telephone Encounter (Signed)
Is pt available at  1:30 on 12/9.

## 2019-04-16 NOTE — Telephone Encounter (Signed)
Pt is scheduled for in office visit on 04/17/2019 per Dr Volanda Napoleon

## 2019-04-17 ENCOUNTER — Ambulatory Visit (INDEPENDENT_AMBULATORY_CARE_PROVIDER_SITE_OTHER): Payer: Medicare Other | Admitting: Family Medicine

## 2019-04-17 ENCOUNTER — Encounter: Payer: Self-pay | Admitting: Family Medicine

## 2019-04-17 ENCOUNTER — Other Ambulatory Visit: Payer: Self-pay

## 2019-04-17 ENCOUNTER — Ambulatory Visit (INDEPENDENT_AMBULATORY_CARE_PROVIDER_SITE_OTHER): Payer: Medicare Other

## 2019-04-17 VITALS — BP 132/78 | HR 72 | Temp 97.9°F | Wt 198.0 lb

## 2019-04-17 DIAGNOSIS — M25512 Pain in left shoulder: Secondary | ICD-10-CM

## 2019-04-17 DIAGNOSIS — M25432 Effusion, left wrist: Secondary | ICD-10-CM

## 2019-04-17 DIAGNOSIS — M19012 Primary osteoarthritis, left shoulder: Secondary | ICD-10-CM | POA: Diagnosis not present

## 2019-04-17 NOTE — Patient Instructions (Addendum)
You can use over the counter Voltaren gel for your shoulder pain. Shoulder Pain Many things can cause shoulder pain, including:  An injury to the shoulder.  Overuse of the shoulder.  Arthritis. The source of the pain can be:  Inflammation.  An injury to the shoulder joint.  An injury to a tendon, ligament, or bone. Follow these instructions at home: Pay attention to changes in your symptoms. Let your health care provider know about them. Follow these instructions to relieve your pain. If you have a sling:  Wear the sling as told by your health care provider. Remove it only as told by your health care provider.  Loosen the sling if your fingers tingle, become numb, or turn cold and blue.  Keep the sling clean.  If the sling is not waterproof: ? Do not let it get wet. Remove it to shower or bathe.  Move your arm as little as possible, but keep your hand moving to prevent swelling. Managing pain, stiffness, and swelling   If directed, put ice on the painful area: ? Put ice in a plastic bag. ? Place a towel between your skin and the bag. ? Leave the ice on for 20 minutes, 2-3 times per day. Stop applying ice if it does not help with the pain.  Squeeze a soft ball or a foam pad as much as possible. This helps to keep the shoulder from swelling. It also helps to strengthen the arm. General instructions  Take over-the-counter and prescription medicines only as told by your health care provider.  Keep all follow-up visits as told by your health care provider. This is important. Contact a health care provider if:  Your pain gets worse.  Your pain is not relieved with medicines.  New pain develops in your arm, hand, or fingers. Get help right away if:  Your arm, hand, or fingers: ? Tingle. ? Become numb. ? Become swollen. ? Become painful. ? Turn white or blue. Summary  Shoulder pain can be caused by an injury, overuse, or arthritis.  Pay attention to changes in  your symptoms. Let your health care provider know about them.  This condition may be treated with a sling, ice, and pain medicines.  Contact your health care provider if the pain gets worse or new pain develops. Get help right away if your arm, hand, or fingers tingle or become numb, swollen, or painful.  Keep all follow-up visits as told by your health care provider. This is important. This information is not intended to replace advice given to you by your health care provider. Make sure you discuss any questions you have with your health care provider. Document Released: 02/02/2005 Document Revised: 11/07/2017 Document Reviewed: 11/07/2017 Elsevier Patient Education  2020 [place].  Ganglion Cyst  A ganglion cyst is a non-cancerous, fluid-filled lump that occurs near a joint or tendon. The cyst grows out of a joint or the lining of a tendon. Ganglion cysts most often develop in the hand or wrist, but they can also develop in the shoulder, elbow, hip, knee, ankle, or foot. Ganglion cysts are ball-shaped or egg-shaped. Their size can range from the size of a pea to larger than a grape. Increased activity may cause the cyst to get bigger because more fluid starts to build up. What are the causes? The exact cause of this condition is not known, but it may be related to:  Inflammation or irritation around the joint.  An injury.  Repetitive movements or overuse.  Arthritis. What increases the risk? You are more likely to develop this condition if:  You are a woman.  You are 55-58 years old. What are the signs or symptoms? The main symptom of this condition is a lump. It most often appears on the hand or wrist. In many cases, there are no other symptoms, but a cyst can sometimes cause:  Tingling.  Pain.  Numbness.  Muscle weakness.  Weak grip.  Less range of motion in a joint. How is this diagnosed? Ganglion cysts are usually diagnosed based on a physical exam. Your  health care provider will feel the lump and may shine a light next to it. If it is a ganglion cyst, the light will likely shine through it. Your health care provider may order an X-ray, ultrasound, or MRI to rule out other conditions. How is this treated? Ganglion cysts often go away on their own without treatment. If you have pain or other symptoms, treatment may be needed. Treatment is also needed if the ganglion cyst limits your movement or if it gets infected. Treatment may include:  Wearing a brace or splint on your wrist or finger.  Taking anti-inflammatory medicine.  Having fluid drained from the lump with a needle (aspiration).  Getting a steroid injected into the joint.  Having surgery to remove the ganglion cyst.  Placing a pad on your shoe or wearing shoes that will not rub against the cyst if it is on your foot. Follow these instructions at home:  Do not press on the ganglion cyst, poke it with a needle, or hit it.  Take over-the-counter and prescription medicines only as told by your health care provider.  If you have a brace or splint: ? Wear it as told by your health care provider. ? Remove it as told by your health care provider. Ask if you need to remove it when you take a shower or a bath.  Watch your ganglion cyst for any changes.  Keep all follow-up visits as told by your health care provider. This is important. Contact a health care provider if:  Your ganglion cyst becomes larger or more painful.  You have pus coming from the lump.  You have weakness or numbness in the affected area.  You have a fever or chills. Get help right away if:  You have a fever and have any of these in the cyst area: ? Increased redness. ? Red streaks. ? Swelling. Summary  A ganglion cyst is a non-cancerous, fluid-filled lump that occurs near a joint or tendon.  Ganglion cysts most often develop in the hand or wrist, but they can also develop in the shoulder, elbow, hip,  knee, ankle, or foot.  Ganglion cysts often go away on their own without treatment. This information is not intended to replace advice given to you by your health care provider. Make sure you discuss any questions you have with your health care provider. Document Released: 04/22/2000 Document Revised: 04/07/2017 Document Reviewed: 12/23/2016 Elsevier Patient Education  2020 [employer].

## 2019-04-17 NOTE — Progress Notes (Signed)
Subjective:    Patient ID: Felicia Cross, female    DOB: 1932-10-26, 85 y.o.   MRN: [id number]  No chief complaint on file.   HPI Patient was seen today for ongoing concern.  Pt with edema of L wrist x 1 yr.  Notes decreased sensation in wrist and hand.  Pt denies pain or tingling.  Denies injury, fever, chills.  Pt also notes L shoulder pain x 3 months with decreased ROM.  Has pain if tries to lift L arm over head.  Has tried OTC analgesic rubs.  Past Medical History:  Diagnosis Date  . Anemia 01/18/2016   -reports on and off her whole life, on oral iron in the past remotely per report  . Arthritis   . B12 deficiency 01/18/2016  . CAD (coronary artery disease)   . CVD (cerebrovascular disease)   . Diverticulosis of colon   . H. pylori infection 10/20/2015  . Hyperlipidemia   . Hypertension   . Osteoarthritis 03/07/2006   Qualifier: Diagnosis of  By: Cato Mulligan MD, Bruce      Allergies  Allergen Reactions  . Codeine Nausea And Vomiting  . Hydrocodone Nausea And Vomiting    ROS General: Denies fever, chills, night sweats, changes in weight, changes in appetite HEENT: Denies headaches, ear pain, changes in vision, rhinorrhea, sore throat CV: Denies CP, palpitations, SOB, orthopnea Pulm: Denies SOB, cough, wheezing GI: Denies abdominal pain, nausea, vomiting, diarrhea, constipation GU: Denies dysuria, hematuria, frequency, vaginal discharge Msk: Denies muscle cramps, joint pains  +L shoulder pain and L wrist edema Neuro: Denies weakness, numbness, tingling  +decreased sensation in L wrist and hand Skin: Denies rashes, bruising Psych: Denies depression, anxiety, hallucinations      Objective:    Blood pressure 132/78, pulse 72, temperature 97.9 F (36.6 C), temperature source Temporal, weight 198 lb (89.8 kg), SpO2 98 %.  Gen. Pleasant, well-nourished, in no distress, normal affect   HEENT: [place]/AT, face symmetric, no scleral icterus, PERRLA, EOMI, nares patent without  drainage Lungs: no accessory muscle use, CTAB, no wheezes or rales Cardiovascular: RRR, no m/r/g, no peripheral edema Musculoskeletal: Mild edema of L wrist, 7 mm raised round mobile nodule on anterior wrist.  Negative Tinel and Phalen's bilaterally.  Normal grip strength bilaterally.  No deformities, no cyanosis or clubbing, normal tone.  TTP of L anterior shoulder at [facility] joint.  Active and passive ROM limited 2/2 pain.  Able to raise L arm to 90 degrees. Neuro:  A&Ox3, CN II-XII intact, normal gait.  Soft and fine prick sensation normal and even b/l. Skin:  Warm, no lesions/ rash  Wt Readings from Last 3 Encounters:  10/22/18 193 lb (87.5 kg)  06/21/18 185 lb 3 oz (84 kg)  05/24/18 186 lb 1.6 oz (84.4 kg)    Lab Results  Component Value Date   WBC 10.9 (H) 06/21/2018   HGB 12.4 06/21/2018   HCT 39.4 06/21/2018   PLT 252 06/21/2018   GLUCOSE 106 (H) 06/21/2018   CHOL 180 11/05/2018   TRIG 64 11/05/2018   HDL 72 11/05/2018   LDLDIRECT 138.1 05/19/2011   LDLCALC 95 11/05/2018   ALT 16 11/05/2018   AST 26 11/05/2018   NA 137 06/21/2018   K 3.7 06/21/2018   CL 101 06/21/2018   CREATININE 1.00 06/21/2018   BUN 15 06/21/2018   CO2 27 06/21/2018   TSH 2.25 10/15/2015   INR 1.04 09/04/2015   HGBA1C 6.2 05/24/2018    Assessment/Plan:  Swelling of  left wrist  -discussed possible causes including ganglion cyst, arthritis, neruopathy -discussed supportive care - Plan: Ambulatory referral to Orthopedic Surgery  Acute pain of left shoulder  -discussed possible causes including AC joint arthritis -discussed supportive care including NSAIDs, OTC voltaren gel. - Plan: DG Shoulder Left, Ambulatory referral to Orthopedic Surgery  F/u prn  Grier Mitts, MD

## 2019-04-22 ENCOUNTER — Encounter: Payer: Self-pay | Admitting: Family Medicine

## 2019-04-22 ENCOUNTER — Ambulatory Visit: Payer: Self-pay

## 2019-04-22 ENCOUNTER — Other Ambulatory Visit: Payer: Self-pay

## 2019-04-22 ENCOUNTER — Ambulatory Visit: Payer: Medicare Other | Admitting: Family Medicine

## 2019-04-22 DIAGNOSIS — M25512 Pain in left shoulder: Secondary | ICD-10-CM

## 2019-04-22 DIAGNOSIS — M25532 Pain in left wrist: Secondary | ICD-10-CM

## 2019-04-22 DIAGNOSIS — G8929 Other chronic pain: Secondary | ICD-10-CM | POA: Diagnosis not present

## 2019-04-22 DIAGNOSIS — M25432 Effusion, left wrist: Secondary | ICD-10-CM | POA: Diagnosis not present

## 2019-04-22 MED ORDER — DICLOFENAC SODIUM 1 % EX GEL: 4.0000 g | Freq: Four times a day (QID) | CUTANEOUS | 6 refills | Status: DC | PRN

## 2019-04-22 NOTE — Progress Notes (Signed)
I saw and examined the patient with Dr. Mayer Masker and agree with assessment and plan as outlined.    Chronic left shoulder pain probably mostly due to glenohumeral DJD.  Symptoms are manageable.  Will try voltaren gel, glucosamine, turmeric.  Will refer to PT for home exercise program.  Left forearm swelling seems to be fatty tissue.  Possibly lipoma, but no distinct boundaries.  Asymptomatic and has been present for quite a while.  If it changes, will order MRI.  Otherwise I think this is a benign process.

## 2019-04-22 NOTE — Progress Notes (Signed)
[employer] - 85 y.o. female MRN [id number]  Date of birth: 18-Jun-1932  Office Visit Note: Visit Date: 04/22/2019 PCP: Billie Ruddy, MD Referred by: Billie Ruddy, MD  Subjective: Chief Complaint  Patient presents with  . Left Shoulder - Pain  . Left Wrist - Edema   HPI: Felicia Cross is a 85 y.o. female who comes in today with chronic left shoulder pain and left wrist swelling.  Shoulder pain- she has had intermittent mild shoulder pain for a long time, had recent x-rays that showed arthritis. No inciting injury. She has pain with certain movements with arm overhead. Pain improved with aleve. Pain does not interfere with sleep. She is also going to try voltaren gel.  Arm swelling- swelling over dorsal aspect of wrist for unknown period of time, at least 1 year. No pain, discoloration. She feels like it has gotten a little larger recently. No injury.   ROS Otherwise per HPI.  Assessment & Plan: Visit Diagnoses:  1. Chronic left shoulder pain   2. Pain and swelling of left wrist     Plan:  1. Left dorsal wrist swelling- appears like lipoma on ultrasound. Reassurance provided  2. Left shoulder pain- Arthritis with preserved ROM. Will trial voltaren gel, glucosamine and tumeric supplementation. Referral to PT to review rotator cuff strengthening exercises for HEP.   Meds & Orders:  Meds ordered this encounter  Medications  . diclofenac Sodium (VOLTAREN) 1 % GEL    Sig: Apply 4 g topically 4 (four) times daily as needed.    Dispense:  500 g    Refill:  6    Orders Placed This Encounter  Procedures  . MSK [place] - NO CHARGES  . Ambulatory referral to Physical Therapy    Follow-up: PRN  Procedures: No procedures performed  No notes on file   Clinical History: No specialty comments available.   She reports that she quit smoking about 38 years ago. She has a 30.00 pack-year smoking history. She has never used smokeless tobacco.  Recent Labs     05/24/18 1101  HGBA1C 6.2    Objective:  VS:  HT:    WT:   BMI:     BP:   HR: bpm  TEMP: ( )  RESP:  Physical Exam  PHYSICAL EXAM: Gen: NAD, alert, cooperative with exam, well-appearing HEENT: clear conjunctiva,  CV:  no edema, capillary refill brisk, normal rate Resp: non-labored Skin: no rashes, normal turgor  Neuro: no gross deficits.  Psych:  alert and oriented  Ortho Exam  Shoulder: Inspection reveals no obvious deformity, atrophy, or asymmetry. No bruising. No swelling Mild TTP over lateral shoulder. No TTP over [facility] joint or bicipital groove. Full ROM in flexion, abduction, internal/external rotation NV intact distally Normal scapular function observed. Special Tests:  - Impingement: Positive Hawkins - Supraspinatous: Negative empty can.  5/5 strength with resisted flexion at 20 degrees - Infraspinatous/Teres Minor: 5/5 strength with ER - Subscapularis:  5/5 strength with IR - Biceps tendon: Negative Speeds, Yerrgason's  - AC Joint: Negative cross arm - No painful arc and no drop arm sign  Imaging: MSK [place] - NO CHARGES  Result Date: 04/22/2019 Please see Notes tab for imaging impression.  [place] of L dorsal wrist with increased fatty tissue consistent with lipoma.  Past Medical/Family/Surgical/Social History: Medications & Allergies reviewed per EMR, new medications updated. Patient Active Problem List   Diagnosis Date Noted  . Vertigo 07/06/2018  . B12 deficiency 01/18/2016  .  Anemia 01/18/2016  . S/P total knee replacement 09/14/2015  . Carotid arterial disease (HCC) 05/27/2009  . Dyslipidemia 06/27/2008  . Coronary atherosclerosis 11/06/2006  . Essential hypertension 03/07/2006  . Osteoarthritis 03/07/2006   Past Medical History:  Diagnosis Date  . Anemia 01/18/2016   -reports on and off her whole life, on oral iron in the past remotely per report  . Arthritis   . B12 deficiency 01/18/2016  . CAD (coronary artery disease)   . CVD (cerebrovascular  disease)   . Diverticulosis of colon   . H. pylori infection 10/20/2015  . Hyperlipidemia   . Hypertension   . Osteoarthritis 03/07/2006   Qualifier: Diagnosis of  By: Cato Mulligan MD, Bruce     Family History  Problem Relation Age of Onset  . Diabetes Mother   . Hypertension Father   . Kidney disease Father   . Heart attack Sister   . Heart attack Brother   . Colon cancer Neg Hx   . Stomach cancer Neg Hx   . Esophageal cancer Neg Hx   . Rectal cancer Neg Hx    Past Surgical History:  Procedure Laterality Date  . ABDOMINAL HYSTERECTOMY  1984  . REPLACEMENT TOTAL KNEE  2007   left  . TOTAL HIP ARTHROPLASTY  2010   Dr. Turner Daniels, right  . TOTAL KNEE ARTHROPLASTY Right 09/14/2015   Procedure: TOTAL KNEE ARTHROPLASTY;  Surgeon: Dannielle Huh, MD;  Location: [facility] OR;  Service: Orthopedics;  Laterality: Right;   Social History   Occupational History  . Not on file  Tobacco Use  . Smoking status: Former Smoker    Packs/day: 1.00    Years: 30.00    Pack years: 30.00    Quit date: 11/01/1980    Years since quitting: 38.4  . Smokeless tobacco: Never Used  Substance and Sexual Activity  . Alcohol use: Yes    Alcohol/week: 0.0 standard drinks    Comment: glass of wine  . Drug use: No  . Sexual activity: Not on file

## 2019-04-24 ENCOUNTER — Ambulatory Visit: Payer: Medicare Other | Admitting: Podiatry

## 2019-04-26 ENCOUNTER — Other Ambulatory Visit: Payer: Self-pay

## 2019-04-26 ENCOUNTER — Encounter: Payer: Self-pay | Admitting: Podiatry

## 2019-04-26 ENCOUNTER — Ambulatory Visit: Payer: Medicare Other | Admitting: Podiatry

## 2019-04-26 VITALS — BP 134/70

## 2019-04-26 DIAGNOSIS — M79675 Pain in left toe(s): Secondary | ICD-10-CM

## 2019-04-26 DIAGNOSIS — B351 Tinea unguium: Secondary | ICD-10-CM

## 2019-04-26 DIAGNOSIS — L603 Nail dystrophy: Secondary | ICD-10-CM | POA: Diagnosis not present

## 2019-04-26 DIAGNOSIS — M79674 Pain in right toe(s): Secondary | ICD-10-CM | POA: Diagnosis not present

## 2019-04-26 NOTE — Progress Notes (Signed)
  Subjective:  Patient ID: Felicia Cross, female    DOB: [birth year],  MRN: [id number]  Chief Complaint  Patient presents with  . Nail Problem    pt is here for right great toenail pain pt states it hurts under the toenails, pt states that pain is elevated to the touch   85 y.o. female returns for the above complaint.  Patient presents with thickened elongated toenails.  She states that it is causing a lot of pain especially when ambulating.  She states that has been going on for about a month.  It is getting worse.  Is pain is elevated when ambulating with excessive pressure.  She has been wearing regular sneakers.  She denies any other acute complaints.  Objective:   Vitals:   04/26/19 0845  BP: 134/70   Podiatric Exam: Vascular: dorsalis pedis and posterior tibial pulses are palpable bilateral. Capillary return is immediate. Temperature gradient is WNL. Skin turgor WNL  Sensorium: Normal Semmes Weinstein monofilament test. Normal tactile sensation bilaterally. Nail Exam: Pt has thick disfigured discolored nails with subungual debris noted bilateral entire nail hallux through fifth toenails Ulcer Exam: There is no evidence of ulcer or pre-ulcerative changes or infection. Orthopedic Exam: Muscle tone and strength are WNL. No limitations in general ROM. No crepitus or effusions noted. HAV  B/L.  Hammer toes 2-5  B/L. Skin: No Porokeratosis. No infection or ulcers  Assessment & Plan:  Patient was evaluated and treated and all questions answered.  Onychomycosis with pain  -Nails palliatively debrided as below. -Educated on self-care  Procedure: Nail Debridement Rationale: pain  Type of Debridement: manual, sharp debridement. Instrumentation: Nail nipper, rotary burr. Number of Nails: 10  Procedures and Treatment: Consent by patient was obtained for treatment procedures. The patient understood the discussion of treatment and procedures well. All questions were answered thoroughly  reviewed. Debridement of mycotic and hypertrophic toenails, 1 through 5 bilateral and clearing of subungual debris. No ulceration, no infection noted.  Return Visit-Office Procedure: Patient instructed to return to the office for a follow up visit 3 months for continued evaluation and treatment.  Boneta Lucks, DPM    No follow-ups on file.

## 2019-05-11 ENCOUNTER — Other Ambulatory Visit: Payer: Self-pay | Admitting: Cardiology

## 2019-06-03 ENCOUNTER — Encounter: Payer: Self-pay | Admitting: Family Medicine

## 2019-06-03 ENCOUNTER — Ambulatory Visit (INDEPENDENT_AMBULATORY_CARE_PROVIDER_SITE_OTHER): Payer: Medicare Other | Admitting: Family Medicine

## 2019-06-03 ENCOUNTER — Other Ambulatory Visit: Payer: Self-pay

## 2019-06-03 VITALS — BP 170/80 | HR 85 | Temp 97.9°F | Wt 199.2 lb

## 2019-06-03 DIAGNOSIS — S0011XA Contusion of right eyelid and periocular area, initial encounter: Secondary | ICD-10-CM | POA: Diagnosis not present

## 2019-06-03 DIAGNOSIS — R42 Dizziness and giddiness: Secondary | ICD-10-CM

## 2019-06-03 DIAGNOSIS — I10 Essential (primary) hypertension: Secondary | ICD-10-CM | POA: Diagnosis not present

## 2019-06-03 NOTE — Progress Notes (Signed)
   Subjective:    Patient ID: Felicia Cross, female    DOB: [birth year], 86 y.o.   MRN: [id number]  HPI Here with her daughter to check on injuries from a fall that occurred at 7 pm last night. She was leaving a restaurant with a bag of food and her foot stepped off a curb. This caused her to fall forward and she landed face down on the parking lot. There was no LOC and she remembers everything. This was witnessed by 2 other people who came to her aid. She seemed to be okay and she was able to drive the short distance home. At home her other daughter, a Charity fundraiser, checked her out and treated the small laceration on her right temple. She applied pressure and ice to the site. Today the swelling is down. She has no pain and no complaints at all today. She is mildly dizzy at times, but she attributes this to vertigo, which she gets frequently. No blurred vision or headaches. No nausea.    Review of Systems  Constitutional: Negative.   Eyes: Negative.   Respiratory: Negative.   Cardiovascular: Negative.   Neurological: Positive for dizziness. Negative for tremors, seizures, syncope, facial asymmetry, speech difficulty, weakness, light-headedness, numbness and headaches.       Objective:   Physical Exam Constitutional:      General: She is not in acute distress.    Appearance: Normal appearance.     Comments: She walks with a cane   HENT:     Head:     Comments: The right eyebrow is sightly swollen and has a small closed laceration. No tenderness at all in the area or along the orbital rim     Nose: Nose normal.     Mouth/Throat:     Pharynx: Oropharynx is clear.  Eyes:     Extraocular Movements: Extraocular movements intact.     Conjunctiva/sclera: Conjunctivae normal.     Pupils: Pupils are equal, round, and reactive to light.  Cardiovascular:     Rate and Rhythm: Normal rate and regular rhythm.     Pulses: Normal pulses.     Heart sounds: Normal heart sounds.  Pulmonary:     Effort:  Pulmonary effort is normal.     Breath sounds: Normal breath sounds.  Musculoskeletal:     Cervical back: Normal range of motion and neck supple. No tenderness.  Neurological:     General: No focal deficit present.     Mental Status: She is alert and oriented to person, place, and time. Mental status is at baseline.           Assessment & Plan:  She has a small laceration and a small hematoma from a contusion to the right eyebrow. There appears to be no other injuries and no intracranial problems. We agreed to not send her for a head scan, but she and the family will be watching closely for any worsening dizziness or headaches or vision changes or nausea. Recheck prn. Gershon Crane, MD

## 2019-06-17 ENCOUNTER — Ambulatory Visit: Payer: Medicare Other | Attending: Internal Medicine

## 2019-06-17 DIAGNOSIS — Z23 Encounter for immunization: Secondary | ICD-10-CM | POA: Insufficient documentation

## 2019-06-17 NOTE — Progress Notes (Signed)
   Covid-19 Vaccination Clinic  Name:  Felicia Cross    MRN: [id number] DOB: 1933-04-18  06/17/2019  Felicia Cross was observed post Covid-19 immunization for 15 minutes without incidence. She was provided with Vaccine Information Sheet and instruction to access the V-Safe system.   Felicia Cross was instructed to call 911 with any severe reactions post vaccine: Marland Kitchen Difficulty breathing  . Swelling of your face and throat  . A fast heartbeat  . A bad rash all over your body  . Dizziness and weakness    Immunizations Administered    Name Date Dose VIS Date Route   Pfizer COVID-19 Vaccine 06/17/2019  1:11 PM 0.3 mL 04/19/2019 Intramuscular   Manufacturer: [employer], [employer]   Lot: [id number]   NDC: [address]-0

## 2019-06-25 DIAGNOSIS — Z23 Encounter for immunization: Secondary | ICD-10-CM | POA: Diagnosis not present

## 2019-06-25 DIAGNOSIS — S61419A Laceration without foreign body of unspecified hand, initial encounter: Secondary | ICD-10-CM | POA: Diagnosis not present

## 2019-06-25 DIAGNOSIS — W19XXXA Unspecified fall, initial encounter: Secondary | ICD-10-CM | POA: Diagnosis not present

## 2019-06-26 DIAGNOSIS — I10 Essential (primary) hypertension: Secondary | ICD-10-CM | POA: Diagnosis not present

## 2019-06-26 DIAGNOSIS — S61419A Laceration without foreign body of unspecified hand, initial encounter: Secondary | ICD-10-CM | POA: Diagnosis not present

## 2019-06-26 DIAGNOSIS — W19XXXA Unspecified fall, initial encounter: Secondary | ICD-10-CM | POA: Diagnosis not present

## 2019-07-11 ENCOUNTER — Ambulatory Visit: Payer: Medicare Other | Attending: Internal Medicine

## 2019-07-11 DIAGNOSIS — Z23 Encounter for immunization: Secondary | ICD-10-CM | POA: Insufficient documentation

## 2019-07-11 NOTE — Progress Notes (Signed)
   Covid-19 Vaccination Clinic  Name:  Felicia Cross    MRN: [id number] DOB: 1932/05/17  07/11/2019  Felicia Cross was observed post Covid-19 immunization for 15 minutes without incident. She was provided with Vaccine Information Sheet and instruction to access the V-Safe system.   Felicia Cross was instructed to call 911 with any severe reactions post vaccine: Marland Kitchen Difficulty breathing  . Swelling of face and throat  . A fast heartbeat  . A bad rash all over body  . Dizziness and weakness   Immunizations Administered    Name Date Dose VIS Date Route   Pfizer COVID-19 Vaccine 07/11/2019  6:33 PM 0.3 mL 04/19/2019 Intramuscular   Manufacturer: [employer], [employer]   Lot: [id number]   NDC: [address]-8

## 2019-07-26 ENCOUNTER — Ambulatory Visit: Payer: Medicare Other | Admitting: Podiatry

## 2019-09-19 ENCOUNTER — Other Ambulatory Visit: Payer: Self-pay

## 2019-09-19 ENCOUNTER — Ambulatory Visit (INDEPENDENT_AMBULATORY_CARE_PROVIDER_SITE_OTHER): Payer: Medicare Other | Admitting: Family Medicine

## 2019-09-19 ENCOUNTER — Encounter: Payer: Self-pay | Admitting: Family Medicine

## 2019-09-19 VITALS — BP 120/80 | HR 70 | Temp 98.0°F | Wt 191.0 lb

## 2019-09-19 DIAGNOSIS — M545 Low back pain, unspecified: Secondary | ICD-10-CM

## 2019-09-19 NOTE — Progress Notes (Signed)
Subjective:    Patient ID: Felicia Cross, female    DOB: 1932/07/21, 86 y.o.   MRN: [id number]  No chief complaint on file.   HPI Patient was seen today for ongoing concern.  Pt endorses falling after tripping over her feet last Wednesday.  Pt fell between a chair and a table.  Unsure how she landed, but denies hitting head or LOC.  Notes intermittent right hip pain.  Has not had to take anything for this.  Ambulating with a cane.  Denies hearing any pops, clicks, or tears or inability to bear weight.  Pt states she is moving to another apt near the clubhouse.  Past Medical History:  Diagnosis Date  . Anemia 01/18/2016   -reports on and off her whole life, on oral iron in the past remotely per report  . Arthritis   . B12 deficiency 01/18/2016  . CAD (coronary artery disease)   . CVD (cerebrovascular disease)   . Diverticulosis of colon   . H. pylori infection 10/20/2015  . Hyperlipidemia   . Hypertension   . Osteoarthritis 03/07/2006   Qualifier: Diagnosis of  By: Cato Mulligan MD, Bruce      Allergies  Allergen Reactions  . Codeine Nausea And Vomiting  . Hydrocodone Nausea And Vomiting    ROS General: Denies fever, chills, night sweats, changes in weight, changes in appetite HEENT: Denies headaches, ear pain, changes in vision, rhinorrhea, sore throat CV: Denies CP, palpitations, SOB, orthopnea Pulm: Denies SOB, cough, wheezing GI: Denies abdominal pain, nausea, vomiting, diarrhea, constipation GU: Denies dysuria, hematuria, frequency, vaginal discharge Msk: Denies muscle cramps + joint pain-R hip Neuro: Denies weakness, numbness, tingling Skin: Denies rashes, bruising Psych: Denies depression, anxiety, hallucinations   Objective:    Blood pressure 120/80, pulse 70, temperature 98 F (36.7 C), temperature source Temporal, weight 191 lb (86.6 kg), SpO2 98 %.  Gen. Pleasant, well-nourished, in no distress, normal affect   HEENT: [place]/AT, thinning hair, face symmetric, no  scleral icterus, PERRLA, EOMI, nares patent without drainage Lungs: no accessory muscle use, CTAB, no wheezes or rales Cardiovascular: RRR, no m/r/g, no peripheral edema Musculoskeletal: No TTP of cervical, thoracic, lumbar spine, or sciatic nerve.  Mild TTP of right lower lumbar paraspinal muscles.  Negative logroll, negative straight leg raise, FADIR, FABER b/l.  No deformities, no cyanosis or clubbing, normal tone Neuro:  A&Ox3, CN II-XII intact, ambulating with a cane. Skin:  Warm, no lesions/ rash  Wt Readings from Last 3 Encounters:  09/19/19 191 lb (86.6 kg)  06/03/19 199 lb 3.2 oz (90.4 kg)  04/17/19 198 lb (89.8 kg)    Lab Results  Component Value Date   WBC 10.9 (H) 06/21/2018   HGB 12.4 06/21/2018   HCT 39.4 06/21/2018   PLT 252 06/21/2018   GLUCOSE 106 (H) 06/21/2018   CHOL 180 11/05/2018   TRIG 64 11/05/2018   HDL 72 11/05/2018   LDLDIRECT 138.1 05/19/2011   LDLCALC 95 11/05/2018   ALT 16 11/05/2018   AST 26 11/05/2018   NA 137 06/21/2018   K 3.7 06/21/2018   CL 101 06/21/2018   CREATININE 1.00 06/21/2018   BUN 15 06/21/2018   CO2 27 06/21/2018   TSH 2.25 10/15/2015   INR 1.04 09/04/2015   HGBA1C 6.2 05/24/2018    Assessment/Plan:  Acute right-sided low back pain without sciatica -Discussed possible causes including muscle strain -Discussed supportive care including heat, massage, topical analgesics, NSAIDs as needed, stretching -Discussed likely duration of symptoms -discussed possible  risk a/w msk relaxers and Benzos as on the [employer] list.  Consider for continued or worsened symptoms. -Given handout of exercises -Imaging not indicated at this time consider continued symptoms. -Given precautions  F/u prn  Grier Mitts, MD

## 2019-09-19 NOTE — Patient Instructions (Addendum)
Acute Back Pain, Adult Acute back pain is sudden and usually short-lived. It is often caused by an injury to the muscles and tissues in the back. The injury may result from:  A muscle or ligament getting overstretched or torn (strained). Ligaments are tissues that connect bones to each other. Lifting something improperly can cause a back strain.  Wear and tear (degeneration) of the spinal disks. Spinal disks are circular tissue that provides cushioning between the bones of the spine (vertebrae).  Twisting motions, such as while playing sports or doing yard work.  A hit to the back.  Arthritis. You may have a physical exam, lab tests, and imaging tests to find the cause of your pain. Acute back pain usually goes away with rest and home care. Follow these instructions at home: Managing pain, stiffness, and swelling  Take over-the-counter and prescription medicines only as told by your health care provider.  Your health care provider may recommend applying ice during the first 24-48 hours after your pain starts. To do this: ? Put ice in a plastic bag. ? Place a towel between your skin and the bag. ? Leave the ice on for 20 minutes, 2-3 times a day.  If directed, apply heat to the affected area as often as told by your health care provider. Use the heat source that your health care provider recommends, such as a moist heat pack or a heating pad. ? Place a towel between your skin and the heat source. ? Leave the heat on for 20-30 minutes. ? Remove the heat if your skin turns bright red. This is especially important if you are unable to feel pain, heat, or cold. You have a greater risk of getting burned. Activity   Do not stay in bed. Staying in bed for more than 1-2 days can delay your recovery.  Sit up and stand up straight. Avoid leaning forward when you sit, or hunching over when you stand. ? If you work at a desk, sit close to it so you do not need to lean over. Keep your chin tucked  in. Keep your neck drawn back, and keep your elbows bent at a right angle. Your arms should look like the letter "L." ? Sit high and close to the steering wheel when you drive. Add lower back (lumbar) support to your car seat, if needed.  Take short walks on even surfaces as soon as you are able. Try to increase the length of time you walk each day.  Do not sit, drive, or stand in one place for more than 30 minutes at a time. Sitting or standing for long periods of time can put stress on your back.  Do not drive or use heavy machinery while taking prescription pain medicine.  Use proper lifting techniques. When you bend and lift, use positions that put less stress on your back: ? Bend your knees. ? Keep the load close to your body. ? Avoid twisting.  Exercise regularly as told by your health care provider. Exercising helps your back heal faster and helps prevent back injuries by keeping muscles strong and flexible.  Work with a physical therapist to make a safe exercise program, as recommended by your health care provider. Do any exercises as told by your physical therapist. Lifestyle  Maintain a healthy weight. Extra weight puts stress on your back and makes it difficult to have good posture.  Avoid activities or situations that make you feel anxious or stressed. Stress and anxiety increase muscle   tension and can make back pain worse. Learn ways to manage anxiety and stress, such as through exercise. General instructions  Sleep on a firm mattress in a comfortable position. Try lying on your side with your knees slightly bent. If you lie on your back, put a pillow under your knees.  Follow your treatment plan as told by your health care provider. This may include: ? Cognitive or behavioral therapy. ? Acupuncture or massage therapy. ? Meditation or yoga. Contact a health care provider if:  You have pain that is not relieved with rest or medicine.  You have increasing pain going down  into your legs or buttocks.  Your pain does not improve after 2 weeks.  You have pain at night.  You lose weight without trying.  You have a fever or chills. Get help right away if:  You develop new bowel or bladder control problems.  You have unusual weakness or numbness in your arms or legs.  You develop nausea or vomiting.  You develop abdominal pain.  You feel faint. Summary  Acute back pain is sudden and usually short-lived.  Use proper lifting techniques. When you bend and lift, use positions that put less stress on your back.  Take over-the-counter and prescription medicines and apply heat or ice as directed by your health care provider. This information is not intended to replace advice given to you by your health care provider. Make sure you discuss any questions you have with your health care provider. Document Revised: 08/14/2018 Document Reviewed: 12/07/2016 Elsevier Patient Education  2020 [employer]  Low Back Sprain or Strain Rehab Ask your health care provider which exercises are safe for you. Do exercises exactly as told by your health care provider and adjust them as directed. It is normal to feel mild stretching, pulling, tightness, or discomfort as you do these exercises. Stop right away if you feel sudden pain or your pain gets worse. Do not begin these exercises until told by your health care provider. Stretching and range-of-motion exercises These exercises warm up your muscles and joints and improve the movement and flexibility of your back. These exercises also help to relieve pain, numbness, and tingling. Lumbar rotation  1. Lie on your back on a firm surface and bend your knees. 2. Straighten your arms out to your sides so each arm forms a 90-degree angle (right angle) with a side of your body. 3. Slowly move (rotate) both of your knees to one side of your body until you feel a stretch in your lower back (lumbar). Try not to let your shoulders lift  off the floor. 4. Hold this position for __________ seconds. 5. Tense your abdominal muscles and slowly move your knees back to the starting position. 6. Repeat this exercise on the other side of your body. Repeat __________ times. Complete this exercise __________ times a day. Single knee to chest  1. Lie on your back on a firm surface with both legs straight. 2. Bend one of your knees. Use your hands to move your knee up toward your chest until you feel a gentle stretch in your lower back and buttock. ? Hold your leg in this position by holding on to the front of your knee. ? Keep your other leg as straight as possible. 3. Hold this position for __________ seconds. 4. Slowly return to the starting position. 5. Repeat with your other leg. Repeat __________ times. Complete this exercise __________ times a day. Prone extension on elbows  1.   Lie on your abdomen on a firm surface (prone position). 2. Prop yourself up on your elbows. 3. Use your arms to help lift your chest up until you feel a gentle stretch in your abdomen and your lower back. ? This will place some of your body weight on your elbows. If this is uncomfortable, try stacking pillows under your chest. ? Your hips should stay down, against the surface that you are lying on. Keep your hip and back muscles relaxed. 4. Hold this position for __________ seconds. 5. Slowly relax your upper body and return to the starting position. Repeat __________ times. Complete this exercise __________ times a day. Strengthening exercises These exercises build strength and endurance in your back. Endurance is the ability to use your muscles for a long time, even after they get tired. Pelvic tilt This exercise strengthens the muscles that lie deep in the abdomen. 1. Lie on your back on a firm surface. Bend your knees and keep your feet flat on the floor. 2. Tense your abdominal muscles. Tip your pelvis up toward the ceiling and flatten your lower  back into the floor. ? To help with this exercise, you may place a small towel under your lower back and try to push your back into the towel. 3. Hold this position for __________ seconds. 4. Let your muscles relax completely before you repeat this exercise. Repeat __________ times. Complete this exercise __________ times a day. Alternating arm and leg raises  1. Get on your hands and knees on a firm surface. If you are on a hard floor, you may want to use padding, such as an exercise mat, to cushion your knees. 2. Line up your arms and legs. Your hands should be directly below your shoulders, and your knees should be directly below your hips. 3. Lift your left leg behind you. At the same time, raise your right arm and straighten it in front of you. ? Do not lift your leg higher than your hip. ? Do not lift your arm higher than your shoulder. ? Keep your abdominal and back muscles tight. ? Keep your hips facing the ground. ? Do not arch your back. ? Keep your balance carefully, and do not hold your breath. 4. Hold this position for __________ seconds. 5. Slowly return to the starting position. 6. Repeat with your right leg and your left arm. Repeat __________ times. Complete this exercise __________ times a day. Abdominal set with straight leg raise  1. Lie on your back on a firm surface. 2. Bend one of your knees and keep your other leg straight. 3. Tense your abdominal muscles and lift your straight leg up, 4-6 inches (10-15 cm) off the ground. 4. Keep your abdominal muscles tight and hold this position for __________ seconds. ? Do not hold your breath. ? Do not arch your back. Keep it flat against the ground. 5. Keep your abdominal muscles tense as you slowly lower your leg back to the starting position. 6. Repeat with your other leg. Repeat __________ times. Complete this exercise __________ times a day. Single leg lower with bent knees 1. Lie on your back on a firm  surface. 2. Tense your abdominal muscles and lift your feet off the floor, one foot at a time, so your knees and hips are bent in 90-degree angles (right angles). ? Your knees should be over your hips and your lower legs should be parallel to the floor. 3. Keeping your abdominal muscles tense and your knee   bent, slowly lower one of your legs so your toe touches the ground. 4. Lift your leg back up to return to the starting position. ? Do not hold your breath. ? Do not let your back arch. Keep your back flat against the ground. 5. Repeat with your other leg. Repeat __________ times. Complete this exercise __________ times a day. Posture and body mechanics Good posture and healthy body mechanics can help to relieve stress in your body's tissues and joints. Body mechanics refers to the movements and positions of your body while you do your daily activities. Posture is part of body mechanics. Good posture means:  Your spine is in its natural S-curve position (neutral).  Your shoulders are pulled back slightly.  Your head is not tipped forward. Follow these guidelines to improve your posture and body mechanics in your everyday activities. Standing   When standing, keep your spine neutral and your feet about hip width apart. Keep a slight bend in your knees. Your ears, shoulders, and hips should line up.  When you do a task in which you stand in one place for a long time, place one foot up on a stable object that is 2-4 inches (5-10 cm) high, such as a footstool. This helps keep your spine neutral. Sitting   When sitting, keep your spine neutral and keep your feet flat on the floor. Use a footrest, if necessary, and keep your thighs parallel to the floor. Avoid rounding your shoulders, and avoid tilting your head forward.  When working at a desk or a computer, keep your desk at a height where your hands are slightly lower than your elbows. Slide your chair under your desk so you are close  enough to maintain good posture.  When working at a computer, place your monitor at a height where you are looking straight ahead and you do not have to tilt your head forward or downward to look at the screen. Resting  When lying down and resting, avoid positions that are most painful for you.  If you have pain with activities such as sitting, bending, stooping, or squatting, lie in a position in which your body does not bend very much. For example, avoid curling up on your side with your arms and knees near your chest (fetal position).  If you have pain with activities such as standing for a long time or reaching with your arms, lie with your spine in a neutral position and bend your knees slightly. Try the following positions: ? Lying on your side with a pillow between your knees. ? Lying on your back with a pillow under your knees. Lifting   When lifting objects, keep your feet at least shoulder width apart and tighten your abdominal muscles.  Bend your knees and hips and keep your spine neutral. It is important to lift using the strength of your legs, not your back. Do not lock your knees straight out.  Always ask for help to lift heavy or awkward objects. This information is not intended to replace advice given to you by your health care provider. Make sure you discuss any questions you have with your health care provider. Document Revised: 08/17/2018 Document Reviewed: 05/17/2018 Elsevier Patient Education  2020 [employer]  

## 2019-10-21 ENCOUNTER — Other Ambulatory Visit: Payer: Medicare Other

## 2019-10-21 ENCOUNTER — Other Ambulatory Visit: Payer: Self-pay

## 2019-10-21 ENCOUNTER — Ambulatory Visit (INDEPENDENT_AMBULATORY_CARE_PROVIDER_SITE_OTHER): Payer: Medicare Other | Admitting: Family Medicine

## 2019-10-21 ENCOUNTER — Encounter: Payer: Self-pay | Admitting: Family Medicine

## 2019-10-21 VITALS — BP 126/80 | HR 76 | Temp 98.3°F | Ht 63.0 in | Wt 190.0 lb

## 2019-10-21 DIAGNOSIS — E785 Hyperlipidemia, unspecified: Secondary | ICD-10-CM

## 2019-10-21 DIAGNOSIS — Z0000 Encounter for general adult medical examination without abnormal findings: Secondary | ICD-10-CM

## 2019-10-21 DIAGNOSIS — E538 Deficiency of other specified B group vitamins: Secondary | ICD-10-CM | POA: Diagnosis not present

## 2019-10-21 DIAGNOSIS — I2510 Atherosclerotic heart disease of native coronary artery without angina pectoris: Secondary | ICD-10-CM | POA: Diagnosis not present

## 2019-10-21 DIAGNOSIS — I10 Essential (primary) hypertension: Secondary | ICD-10-CM | POA: Diagnosis not present

## 2019-10-21 LAB — CBC WITH DIFFERENTIAL/PLATELET
Basophils Absolute: 0.1 K/uL (ref 0.0–0.1)
Basophils Relative: 1.4 % (ref 0.0–3.0)
Eosinophils Absolute: 0.2 K/uL (ref 0.0–0.7)
Eosinophils Relative: 2.7 % (ref 0.0–5.0)
HCT: 36.4 % (ref 36.0–46.0)
Hemoglobin: 12.1 g/dL (ref 12.0–15.0)
Lymphocytes Relative: 34.3 % (ref 12.0–46.0)
Lymphs Abs: 1.9 K/uL (ref 0.7–4.0)
MCHC: 33.3 g/dL (ref 30.0–36.0)
MCV: 92.1 fl (ref 78.0–100.0)
Monocytes Absolute: 0.6 K/uL (ref 0.1–1.0)
Monocytes Relative: 10.3 % (ref 3.0–12.0)
Neutro Abs: 2.9 K/uL (ref 1.4–7.7)
Neutrophils Relative %: 51.3 % (ref 43.0–77.0)
Platelets: 279 K/uL (ref 150.0–400.0)
RBC: 3.95 Mil/uL (ref 3.87–5.11)
RDW: 15.1 % (ref 11.5–15.5)
WBC: 5.6 K/uL (ref 4.0–10.5)

## 2019-10-21 LAB — LIPID PANEL
Cholesterol: 156 mg/dL (ref 0–200)
HDL: 65 mg/dL
LDL Cholesterol: 79 mg/dL (ref 0–99)
NonHDL: 91.11
Total CHOL/HDL Ratio: 2
Triglycerides: 63 mg/dL (ref 0.0–149.0)
VLDL: 12.6 mg/dL (ref 0.0–40.0)

## 2019-10-21 LAB — BASIC METABOLIC PANEL WITH GFR
BUN: 17 mg/dL (ref 6–23)
CO2: 31 meq/L (ref 19–32)
Calcium: 9.7 mg/dL (ref 8.4–10.5)
Chloride: 101 meq/L (ref 96–112)
Creatinine, Ser: 1.11 mg/dL (ref 0.40–1.20)
GFR: 56.58 mL/min — ABNORMAL LOW
Glucose, Bld: 89 mg/dL (ref 70–99)
Potassium: 4 meq/L (ref 3.5–5.1)
Sodium: 139 meq/L (ref 135–145)

## 2019-10-21 LAB — VITAMIN B12: Vitamin B-12: 472 pg/mL (ref 211–911)

## 2019-10-21 LAB — HEMOGLOBIN A1C: Hgb A1c MFr Bld: 6 % (ref 4.6–6.5)

## 2019-10-21 MED ORDER — HYDROCHLOROTHIAZIDE 25 MG PO TABS: 25.0000 mg | ORAL_TABLET | Freq: Every day | ORAL | 3 refills | Status: DC

## 2019-10-21 NOTE — Patient Instructions (Addendum)
There are no preventive care reminders to display for this patient.  Depression screen [facility] 2/9 10/21/2019 11/06/2017 06/03/2016  Decreased Interest 0 0 0  Down, Depressed, Hopeless 0 0 0  PHQ - 2 Score 0 0 0   Preventive Care 65 Years and Older, Female Preventive care refers to lifestyle choices and visits with your health care provider that can promote health and wellness. This includes:  A yearly physical exam. This is also called an annual well check.  Regular dental and eye exams.  Immunizations.  Screening for certain conditions.  Healthy lifestyle choices, such as diet and exercise. What can I expect for my preventive care visit? Physical exam Your health care provider will check:  Height and weight. These may be used to calculate body mass index (BMI), which is a measurement that tells if you are at a healthy weight.  Heart rate and blood pressure.  Your skin for abnormal spots. Counseling Your health care provider may ask you questions about:  Alcohol, tobacco, and drug use.  Emotional well-being.  Home and relationship well-being.  Sexual activity.  Eating habits.  History of falls.  Memory and ability to understand (cognition).  Work and work Statistician.  Pregnancy and menstrual history. What immunizations do I need?  Influenza (flu) vaccine  This is recommended every year. Tetanus, diphtheria, and pertussis (Tdap) vaccine  You may need a Td booster every 10 years. Varicella (chickenpox) vaccine  You may need this vaccine if you have not already been vaccinated. Zoster (shingles) vaccine  You may need this after age 37. Pneumococcal conjugate (PCV13) vaccine  One dose is recommended after age 2. Pneumococcal polysaccharide (PPSV23) vaccine  One dose is recommended after age 76. Measles, mumps, and rubella (MMR) vaccine  You may need at least one dose of MMR if you were born in 1957 or later. You may also need a second dose. Meningococcal  conjugate (MenACWY) vaccine  You may need this if you have certain conditions. Hepatitis A vaccine  You may need this if you have certain conditions or if you travel or work in places where you may be exposed to hepatitis A. Hepatitis B vaccine  You may need this if you have certain conditions or if you travel or work in places where you may be exposed to hepatitis B. Haemophilus influenzae type b (Hib) vaccine  You may need this if you have certain conditions. You may receive vaccines as individual doses or as more than one vaccine together in one shot (combination vaccines). Talk with your health care provider about the risks and benefits of combination vaccines. What tests do I need? Blood tests  Lipid and cholesterol levels. These may be checked every 5 years, or more frequently depending on your overall health.  Hepatitis C test.  Hepatitis B test. Screening  Lung cancer screening. You may have this screening every year starting at age 4 if you have a 30-pack-year history of smoking and currently smoke or have quit within the past 15 years.  Colorectal cancer screening. All adults should have this screening starting at age 18 and continuing until age 28. Your health care provider may recommend screening at age 24 if you are at increased risk. You will have tests every 1-10 years, depending on your results and the type of screening test.  Diabetes screening. This is done by checking your blood sugar (glucose) after you have not eaten for a while (fasting). You may have this done every 1-3 years.  Mammogram.  This may be done every 1-2 years. Talk with your health care provider about how often you should have regular mammograms.  BRCA-related cancer screening. This may be done if you have a family history of breast, ovarian, tubal, or peritoneal cancers. Other tests  Sexually transmitted disease (STD) testing.  Bone density scan. This is done to screen for osteoporosis. You may  have this done starting at age 100. Follow these instructions at home: Eating and drinking  Eat a diet that includes fresh fruits and vegetables, whole grains, lean protein, and low-fat dairy products. Limit your intake of foods with high amounts of sugar, saturated fats, and salt.  Take vitamin and mineral supplements as recommended by your health care provider.  Do not drink alcohol if your health care provider tells you not to drink.  If you drink alcohol: ? Limit how much you have to 0-1 drink a day. ? Be aware of how much alcohol is in your drink. In the [place] one drink equals one 12 oz bottle of beer (355 mL), one 5 oz glass of wine (148 mL), or one 1 oz glass of hard liquor (44 mL). Lifestyle  Take daily care of your teeth and gums.  Stay active. Exercise for at least 30 minutes on 5 or more days each week.  Do not use any products that contain nicotine or tobacco, such as cigarettes, e-cigarettes, and chewing tobacco. If you need help quitting, ask your health care provider.  If you are sexually active, practice safe sex. Use a condom or other form of protection in order to prevent STIs (sexually transmitted infections).  Talk with your health care provider about taking a low-dose aspirin or statin. What's next?  Go to your health care provider once a year for a well check visit.  Ask your health care provider how often you should have your eyes and teeth checked.  Stay up to date on all vaccines. This information is not intended to replace advice given to you by your health care provider. Make sure you discuss any questions you have with your health care provider. Document Revised: 04/19/2018 Document Reviewed: 04/19/2018 Elsevier Patient Education  2020 [employer].

## 2019-10-21 NOTE — Progress Notes (Signed)
Annual Wellness Visit  Patient: Felicia Cross, Female    DOB: Dec 29, 1932, 86 y.o.   MRN: [id number] Visit Date: 10/21/2019  Today's Provider: Billie Ruddy, MD  Subjective:    Chief Complaint  Patient presents with  . Annual Exam   Felicia Cross is a 86 y.o. female who presents today for her Annual Wellness Visit.  Pt states she has been doing well.  In the process of moving into a smaller home.  Denies any issues.  Needs refill on HCTZ for bp.  States bp has been good.  Denies dizziness, CP, changes in vision.  Had recent eye exam.  Told has small cataracts that need to be monitored.  Taking simvastatin 40 mg without issue.  Denies myalgias, issues with R midline..  Has occasional back pain, but notes improvement.    Patient Active Problem List   Diagnosis Date Noted  . Vertigo 07/06/2018  . B12 deficiency 01/18/2016  . Anemia 01/18/2016  . S/P total knee replacement 09/14/2015  . Carotid arterial disease ([place]) 05/27/2009  . Dyslipidemia 06/27/2008  . Coronary atherosclerosis 11/06/2006  . Essential hypertension 03/07/2006  . Osteoarthritis 03/07/2006   Family History  Problem Relation Age of Onset  . Diabetes Mother   . Hypertension Father   . Kidney disease Father   . Heart attack Sister   . Heart attack Brother   . Colon cancer Neg Hx   . Stomach cancer Neg Hx   . Esophageal cancer Neg Hx   . Rectal cancer Neg Hx    Allergies  Allergen Reactions  . Codeine Nausea And Vomiting  . Hydrocodone Nausea And Vomiting       Medications: Outpatient Medications Prior to Visit  Medication Sig  . aspirin EC 81 MG tablet Take 1 tablet (81 mg total) by mouth daily.  . cyanocobalamin 500 MCG tablet Take 500 mcg by mouth daily.  . diclofenac Sodium (VOLTAREN) 1 % GEL Apply 4 g topically 4 (four) times daily as needed.  . dimenhyDRINATE (DRAMAMINE PO) Take by mouth. As needed for vertigo  . gabapentin (NEURONTIN) 100 MG capsule Take 1 capsule by mouth at bedtime    . losartan (COZAAR) 100 MG tablet Take 1 tablet by mouth once daily  . meclizine (ANTIVERT) 25 MG tablet meclizine 25 mg tablet  TAKE 1 TABLET BY MOUTH THREE TIMES A DAY AS NEEDED DAILY  . metoprolol tartrate (LOPRESSOR) 25 MG tablet Take 1 tablet (25 mg total) by mouth 2 (two) times daily.  . Multiple Vitamin (MULTIVITAMIN WITH MINERALS) TABS Take 1 tablet by mouth daily.   . Multiple Vitamins-Minerals (MULTIVITAMIN ADULT EXTRA C PO) multivitamin  . simvastatin (ZOCOR) 40 MG tablet Take 1 tablet (40 mg total) by mouth daily.  . traMADol (ULTRAM) 50 MG tablet TAKE ONE TO TWO TABLETS BY MOUTH EVERY 6 HOURS AS NEEDED FOR PAIN  . [DISCONTINUED] hydrochlorothiazide (HYDRODIURIL) 25 MG tablet Take 1 tablet (25 mg total) by mouth daily.   No facility-administered medications prior to visit.    Allergies  Allergen Reactions  . Codeine Nausea And Vomiting  . Hydrocodone Nausea And Vomiting    Patient Care Team: Billie Ruddy, MD as PCP - General (Family Medicine) Ena Dawley, MD as Consulting Physician (Obstetrics and Gynecology) Lelon Perla, MD as Consulting Physician (Cardiology) Vickey Huger, MD as Consulting Physician (Orthopedic Surgery)  Review of Systems    Objective:    Vitals: BP 126/80   Pulse 76   Temp  98.3 F (36.8 C) (Other (Comment))   Ht 5\' 3"  (1.6 m)   Wt 190 lb (86.2 kg)   SpO2 97%   BMI 33.66 kg/m   Physical Exam  Gen. Pleasant, well developed, well-nourished, in NAD HEENT - [place]/AT, PERRL, EOMI, arcus senilis, conjunctive clear, no scleral icterus, no nasal drainage, pharynx without erythema or exudate.  TMs normal bilaterally. Neck: No JVD, no thyromegaly, no carotid bruits Lungs: no use of accessory muscles, CTAB, no wheezes, rales or rhonchi Cardiovascular: RRR, No r/g/m, no peripheral edema Abdomen: BS present, soft, nontender,nondistended Musculoskeletal: No deformities, moves all four extremities, no cyanosis or clubbing, normal  tone Neuro:  A&Ox3, CN II-XII intact, ambulating with a cane Skin:  Warm, dry, intact, no lesions   Most recent functional status assessment: No flowsheet data found.  Most recent fall risk assessment: Fall Risk  10/21/2019  Falls in the past year? 1  Comment -  Number falls in past yr: 1  Injury with Fall? 1     Most recent depression screenings: PHQ 2/9 Scores 10/21/2019 11/06/2017  PHQ - 2 Score 0 0    Most recent cognitive screening: No flowsheet data found.  No results found for any visits on 10/21/19.     Assessment & Plan:      Annual wellness visit done today including the all of the following: Reviewed patient's Family Medical History Reviewed and updated list of patient's medical providers Assessment of cognitive impairment was done Assessed patient's functional ability Established a written schedule for health screening services Health Risk Assessent Completed and Reviewed  Exercise Activities and Dietary recommendations Goals    . Exercise 150 minutes per week (moderate activity)     May check on Silver sneaker class May try to learn to swim;     . Patient Stated     [facility]  [facility] History  Administered Date(s) Administered  . Influenza Whole 02/07/2011  . Influenza, High Dose Seasonal PF 01/07/2015, 01/23/2015, 01/18/2016, 04/10/2017, 01/25/2018, 02/11/2019  . PFIZER SARS-COV-2 Vaccination 06/17/2019, 07/11/2019  . Pneumococcal Conjugate-13 04/30/2014  . Pneumococcal Polysaccharide-23 05/09/2002, 02/10/2019  . Td 05/09/2004, 05/09/2008, 05/24/2018  . Zoster 04/10/2015    Health Maintenance  Topic Date Due  . INFLUENZA VACCINE  12/08/2019  . TETANUS/TDAP  05/24/2028  . COVID-19 Vaccine  Completed  . PNA vac Low Risk Adult  Completed  . DEXA SCAN  Addressed     Discussed health benefits of physical activity, and encouraged her to engage in regular exercise appropriate for her age and  condition.    Essential hypertension  -controlled -continue current meds: HCTZ 25 mg, lopressor 25 mg BID, losartan 100 mg daily - Plan: Basic metabolic panel, hydrochlorothiazide (HYDRODIURIL) 25 MG tablet  Dyslipidemia  -continue lifestyle modifications -Continue simvastatin 20 mg daily and aspirin 81 mg daily - Plan: Lipid panel  Vitamin B12 deficiency  -Continue cyanocobalamin 500 mcg daily - Plan: CBC with Differential/Platelet  Coronary artery disease involving native heart without angina pectoris, unspecified vessel or lesion type  -continue statin and ASA 81 mg -continue lifestyle modifications. - Plan: Hemoglobin A1c, Lipid panel   05/26/2028, MD  [facility] [place]

## 2019-10-25 ENCOUNTER — Other Ambulatory Visit: Payer: Self-pay | Admitting: Family Medicine

## 2019-10-28 ENCOUNTER — Other Ambulatory Visit: Payer: Self-pay

## 2019-10-28 ENCOUNTER — Ambulatory Visit (INDEPENDENT_AMBULATORY_CARE_PROVIDER_SITE_OTHER): Payer: Medicare Other | Admitting: Podiatry

## 2019-10-28 VITALS — Temp 96.5°F

## 2019-10-28 DIAGNOSIS — M21612 Bunion of left foot: Secondary | ICD-10-CM

## 2019-10-28 DIAGNOSIS — B351 Tinea unguium: Secondary | ICD-10-CM | POA: Diagnosis not present

## 2019-10-28 DIAGNOSIS — M21611 Bunion of right foot: Secondary | ICD-10-CM

## 2019-10-28 DIAGNOSIS — M79674 Pain in right toe(s): Secondary | ICD-10-CM

## 2019-10-28 DIAGNOSIS — M79675 Pain in left toe(s): Secondary | ICD-10-CM

## 2019-10-29 ENCOUNTER — Encounter: Payer: Self-pay | Admitting: Podiatry

## 2019-10-29 NOTE — Progress Notes (Signed)
  Subjective:  Patient ID: Felicia Cross, female    DOB: November 06, 1932,  MRN: [id number]  Chief Complaint  Patient presents with  . Nail Problem    Thick, long toenails.   86 y.o. female returns for the above complaint.  Patient presents with thickened elongated toenails.  She states that it is causing a lot of pain especially when ambulating.  She states that has been going on for about a month.  It is getting worse.  Is pain is elevated when ambulating with excessive pressure.  She has been wearing regular sneakers.  She denies any other acute complaints.  Patient also has secondary complaint of bilateral bunion deformity.  She would like to know if there is any conservative options he can due to cannot take some of the stress off of the bone.  She denies any other acute complaints  Objective:   Vitals:   10/28/19 1412  Temp: (!) 96.5 F (35.8 C)   Podiatric Exam: Vascular: dorsalis pedis and posterior tibial pulses are palpable bilateral. Capillary return is immediate. Temperature gradient is WNL. Skin turgor WNL  Sensorium: Normal Semmes Weinstein monofilament test. Normal tactile sensation bilaterally. Nail Exam: Pt has thick disfigured discolored nails with subungual debris noted bilateral entire nail hallux through fifth toenails Ulcer Exam: There is no evidence of ulcer or pre-ulcerative changes or infection. Orthopedic Exam: Muscle tone and strength are WNL. No limitations in general ROM. No crepitus or effusions noted. HAV  B/L.  Hammer toes 2-5  B/L. Skin: No Porokeratosis. No infection or ulcers  Assessment & Plan:  Patient was evaluated and treated and all questions answered.  Bilateral bunion deformity -I explained to the patient the etiology of bunion deformity and various treatment options were extensively discussed.  I discussed with the patient shoe gear modification with wide toe shoe gear.  Patient states understanding will work on Economist modification.  I  will hold off on any surgical consideration at this time.  Onychomycosis with pain  -Nails palliatively debrided as below. -Educated on self-care  Procedure: Nail Debridement Rationale: pain  Type of Debridement: manual, sharp debridement. Instrumentation: Nail nipper, rotary burr. Number of Nails: 10  Procedures and Treatment: Consent by patient was obtained for treatment procedures. The patient understood the discussion of treatment and procedures well. All questions were answered thoroughly reviewed. Debridement of mycotic and hypertrophic toenails, 1 through 5 bilateral and clearing of subungual debris. No ulceration, no infection noted.  Return Visit-Office Procedure: Patient instructed to return to the office for a follow up visit 3 months for continued evaluation and treatment.  Nicholes Rough, DPM    No follow-ups on file.

## 2019-10-30 ENCOUNTER — Other Ambulatory Visit: Payer: Self-pay | Admitting: Family Medicine

## 2019-10-30 DIAGNOSIS — I10 Essential (primary) hypertension: Secondary | ICD-10-CM

## 2019-10-31 ENCOUNTER — Other Ambulatory Visit: Payer: Self-pay | Admitting: Family Medicine

## 2019-10-31 DIAGNOSIS — I10 Essential (primary) hypertension: Secondary | ICD-10-CM

## 2019-11-27 NOTE — Progress Notes (Deleted)
HPI: FU hypertension, hyperlipidemia and minimal CAD. She had a previous catheterization in August 2006 that showed minor disease in the diagonal and distal LAD. We also performed carotid Dopplers in Feb 2012 that showed 0-39% stenosis. F/U not deemed necessary as this had been stable over serial exams. Stress echo 4/15 normal.Since last seen,  Current Outpatient Medications  Medication Sig Dispense Refill  . aspirin EC 81 MG tablet Take 1 tablet (81 mg total) by mouth daily.    . cyanocobalamin 500 MCG tablet Take 500 mcg by mouth daily.    . diclofenac Sodium (VOLTAREN) 1 % GEL Apply 4 g topically 4 (four) times daily as needed. 500 g 6  . dimenhyDRINATE (DRAMAMINE PO) Take by mouth. As needed for vertigo    . gabapentin (NEURONTIN) 100 MG capsule Take 1 capsule by mouth at bedtime 90 capsule 0  . hydrochlorothiazide (HYDRODIURIL) 25 MG tablet Take 1 tablet by mouth once daily 90 tablet 1  . losartan (COZAAR) 100 MG tablet Take 1 tablet by mouth once daily 90 tablet 3  . meclizine (ANTIVERT) 25 MG tablet meclizine 25 mg tablet  TAKE 1 TABLET BY MOUTH THREE TIMES A DAY AS NEEDED DAILY    . metoprolol tartrate (LOPRESSOR) 25 MG tablet Take 1 tablet (25 mg total) by mouth 2 (two) times daily. 180 tablet 3  . Multiple Vitamin (MULTIVITAMIN WITH MINERALS) TABS Take 1 tablet by mouth daily.     . Multiple Vitamins-Minerals (MULTIVITAMIN ADULT EXTRA C PO) multivitamin    . simvastatin (ZOCOR) 40 MG tablet Take 1 tablet (40 mg total) by mouth daily. 90 tablet 3  . traMADol (ULTRAM) 50 MG tablet TAKE ONE TO TWO TABLETS BY MOUTH EVERY 6 HOURS AS NEEDED FOR PAIN     No current facility-administered medications for this visit.     Past Medical History:  Diagnosis Date  . Anemia 01/18/2016   -reports on and off her whole life, on oral iron in the past remotely per report  . Arthritis   . B12 deficiency 01/18/2016  . CAD (coronary artery disease)   . CVD (cerebrovascular disease)   .  Diverticulosis of colon   . H. pylori infection 10/20/2015  . Hyperlipidemia   . Hypertension   . Osteoarthritis 03/07/2006   Qualifier: Diagnosis of  By: Cato Mulligan MD, Bruce      Past Surgical History:  Procedure Laterality Date  . ABDOMINAL HYSTERECTOMY  1984  . REPLACEMENT TOTAL KNEE  2007   left  . TOTAL HIP ARTHROPLASTY  2010   Dr. Turner Daniels, right  . TOTAL KNEE ARTHROPLASTY Right 09/14/2015   Procedure: TOTAL KNEE ARTHROPLASTY;  Surgeon: Dannielle Huh, MD;  Location: [facility] OR;  Service: Orthopedics;  Laterality: Right;    Social History   Socioeconomic History  . Marital status: Married    Spouse name: Not on file  . Number of children: Not on file  . Years of education: Not on file  . Highest education level: Not on file  Occupational History  . Not on file  Tobacco Use  . Smoking status: Former Smoker    Packs/day: 1.00    Years: 30.00    Pack years: 30.00    Quit date: 11/01/1980    Years since quitting: 39.0  . Smokeless tobacco: Never Used  Substance and Sexual Activity  . Alcohol use: Yes    Alcohol/week: 0.0 standard drinks    Comment: glass of wine  . Drug use: No  .  Sexual activity: Not on file  Other Topics Concern  . 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 [place]      Home 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, Information systems manager and two great grandchildren age 29 and 55      Spiritual Beliefs: Christian      Lifestyle: regular exercise at the [employer]; healthy diet      Social Determinants of Health   Financial Resource Strain:   . Difficulty of Paying Living Expenses:   Food Insecurity:   . Worried About Programme researcher, broadcasting/film/video in the Last Year:   . Barista in the Last Year:   Transportation Needs:   . Freight forwarder (Medical):   Marland Kitchen Lack of Transportation (Non-Medical):   Physical Activity:   . Days of Exercise per Week:   . Minutes of Exercise per Session:   Stress:   .  Feeling of Stress :   Social Connections:   . Frequency of Communication with Friends and Family:   . Frequency of Social Gatherings with Friends and Family:   . Attends Religious Services:   . Active Member of Clubs or Organizations:   . Attends Banker Meetings:   Marland Kitchen Marital Status:   Intimate Partner Violence:   . Fear of Current or Ex-Partner:   . Emotionally Abused:   Marland Kitchen Physically Abused:   . Sexually Abused:     Family History  Problem Relation Age of Onset  . Diabetes Mother   . Hypertension Father   . Kidney disease Father   . Heart attack Sister   . Heart attack Brother   . Colon cancer Neg Hx   . Stomach cancer Neg Hx   . Esophageal cancer Neg Hx   . Rectal cancer Neg Hx     ROS: no fevers or chills, productive cough, hemoptysis, dysphasia, odynophagia, melena, hematochezia, dysuria, hematuria, rash, seizure activity, orthopnea, PND, pedal edema, claudication. Remaining systems are negative.  Physical Exam: Well-developed well-nourished in no acute distress.  Skin is warm and dry.  HEENT is normal.  Neck is supple.  Chest is clear to auscultation with normal expansion.  Cardiovascular exam is regular rate and rhythm.  Abdominal exam nontender or distended. No masses palpated. Extremities show no edema. neuro grossly intact  ECG- personally reviewed  A/P  1 CAD-no CP; continue ASA and statin.  2 hypertension-BP controlled; continue present meds and follow.  3 hyperlipidemia-continue statin.  4 Carotid artery disease-continue ASA and statin.  Olga Millers, MD

## 2019-12-03 ENCOUNTER — Ambulatory Visit: Payer: Medicare Other | Admitting: Cardiology

## 2019-12-03 NOTE — Progress Notes (Signed)
HPI: FU hypertension, hyperlipidemia and minimal CAD. She had a previous catheterization in August 2006 that showed minor disease in the diagonal and distal LAD. We also performed carotid Dopplers in Feb 2012 that showed 0-39% stenosis. F/U not deemed necessary as this had been stable over serial exams. Stress echo 4/15 normal.Since last seen,she denies dyspnea, chest pain, palpitations or syncope.  Current Outpatient Medications  Medication Sig Dispense Refill  . aspirin EC 81 MG tablet Take 1 tablet (81 mg total) by mouth daily.    . cyanocobalamin 500 MCG tablet Take 500 mcg by mouth daily.    . diclofenac Sodium (VOLTAREN) 1 % GEL Apply 4 g topically 4 (four) times daily as needed. 500 g 6  . dimenhyDRINATE (DRAMAMINE PO) Take by mouth. As needed for vertigo    . gabapentin (NEURONTIN) 100 MG capsule Take 1 capsule by mouth at bedtime 90 capsule 0  . hydrochlorothiazide (HYDRODIURIL) 25 MG tablet Take 1 tablet by mouth once daily 90 tablet 1  . losartan (COZAAR) 100 MG tablet Take 1 tablet by mouth once daily 90 tablet 3  . meclizine (ANTIVERT) 25 MG tablet meclizine 25 mg tablet  TAKE 1 TABLET BY MOUTH THREE TIMES A DAY AS NEEDED DAILY    . metoprolol tartrate (LOPRESSOR) 25 MG tablet Take 1 tablet (25 mg total) by mouth 2 (two) times daily. 180 tablet 3  . Multiple Vitamin (MULTIVITAMIN WITH MINERALS) TABS Take 1 tablet by mouth daily.     . Multiple Vitamins-Minerals (MULTIVITAMIN ADULT EXTRA C PO) multivitamin    . simvastatin (ZOCOR) 40 MG tablet Take 1 tablet (40 mg total) by mouth daily. 90 tablet 3  . traMADol (ULTRAM) 50 MG tablet TAKE ONE TO TWO TABLETS BY MOUTH EVERY 6 HOURS AS NEEDED FOR PAIN     No current facility-administered medications for this visit.     Past Medical History:  Diagnosis Date  . Anemia 01/18/2016   -reports on and off her whole life, on oral iron in the past remotely per report  . Arthritis   . B12 deficiency 01/18/2016  . CAD (coronary  artery disease)   . CVD (cerebrovascular disease)   . Diverticulosis of colon   . H. pylori infection 10/20/2015  . Hyperlipidemia   . Hypertension   . Osteoarthritis 03/07/2006   Qualifier: Diagnosis of  By: Cato Mulligan MD, Bruce      Past Surgical History:  Procedure Laterality Date  . ABDOMINAL HYSTERECTOMY  1984  . REPLACEMENT TOTAL KNEE  2007   left  . TOTAL HIP ARTHROPLASTY  2010   Dr. Turner Daniels, right  . TOTAL KNEE ARTHROPLASTY Right 09/14/2015   Procedure: TOTAL KNEE ARTHROPLASTY;  Surgeon: Dannielle Huh, MD;  Location: [facility] OR;  Service: Orthopedics;  Laterality: Right;    Social History   Socioeconomic History  . Marital status: Married    Spouse name: Not on file  . Number of children: Not on file  . Years of education: Not on file  . Highest education level: Not on file  Occupational History  . Not on file  Tobacco Use  . Smoking status: Former Smoker    Packs/day: 1.00    Years: 30.00    Pack years: 30.00    Quit date: 11/01/1980    Years since quitting: 39.1  . Smokeless tobacco: Never Used  Substance and Sexual Activity  . Alcohol use: Yes    Alcohol/week: 0.0 standard drinks    Comment: glass of  wine  . Drug use: No  . Sexual activity: Not on file  Other Topics Concern  . 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 [place]      Home 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, Information systems manager and two great grandchildren age 106 and 23      Spiritual Beliefs: Christian      Lifestyle: regular exercise at the [employer]; healthy diet      Social Determinants of Health   Financial Resource Strain:   . Difficulty of Paying Living Expenses:   Food Insecurity:   . Worried About Programme researcher, broadcasting/film/video in the Last Year:   . Barista in the Last Year:   Transportation Needs:   . Freight forwarder (Medical):   Marland Kitchen Lack of Transportation (Non-Medical):   Physical Activity:   . Days of Exercise per  Week:   . Minutes of Exercise per Session:   Stress:   . Feeling of Stress :   Social Connections:   . Frequency of Communication with Friends and Family:   . Frequency of Social Gatherings with Friends and Family:   . Attends Religious Services:   . Active Member of Clubs or Organizations:   . Attends Banker Meetings:   Marland Kitchen Marital Status:   Intimate Partner Violence:   . Fear of Current or Ex-Partner:   . Emotionally Abused:   Marland Kitchen Physically Abused:   . Sexually Abused:     Family History  Problem Relation Age of Onset  . Diabetes Mother   . Hypertension Father   . Kidney disease Father   . Heart attack Sister   . Heart attack Brother   . Colon cancer Neg Hx   . Stomach cancer Neg Hx   . Esophageal cancer Neg Hx   . Rectal cancer Neg Hx     ROS: no fevers or chills, productive cough, hemoptysis, dysphasia, odynophagia, melena, hematochezia, dysuria, hematuria, rash, seizure activity, orthopnea, PND, pedal edema, claudication. Remaining systems are negative.  Physical Exam: Well-developed well-nourished in no acute distress.  Skin is warm and dry.  HEENT is normal.  Neck is supple.  Chest is clear to auscultation with normal expansion.  Cardiovascular exam is regular rate and rhythm.  Abdominal exam nontender or distended. No masses palpated. Extremities show no edema. neuro grossly intact  ECG-sinus rhythm at a rate of 81, no ST changes.  Personally reviewed  A/P  1 CAD-no CP; continue ASA and statin.  2 hypertension-BP controlled; continue present meds and follow.  3 hyperlipidemia-continue statin.  4 Carotid artery disease-continue ASA and statin.  Olga Millers, MD

## 2019-12-04 ENCOUNTER — Encounter: Payer: Self-pay | Admitting: Cardiology

## 2019-12-04 ENCOUNTER — Ambulatory Visit: Payer: Medicare Other | Admitting: Cardiology

## 2019-12-04 ENCOUNTER — Other Ambulatory Visit: Payer: Self-pay

## 2019-12-04 VITALS — BP 136/84 | HR 81 | Ht 64.0 in | Wt 189.2 lb

## 2019-12-04 DIAGNOSIS — E7800 Pure hypercholesterolemia, unspecified: Secondary | ICD-10-CM | POA: Diagnosis not present

## 2019-12-04 DIAGNOSIS — I10 Essential (primary) hypertension: Secondary | ICD-10-CM | POA: Diagnosis not present

## 2019-12-04 DIAGNOSIS — I2510 Atherosclerotic heart disease of native coronary artery without angina pectoris: Secondary | ICD-10-CM | POA: Diagnosis not present

## 2019-12-04 MED ORDER — METOPROLOL TARTRATE 25 MG PO TABS: 25.0000 mg | ORAL_TABLET | Freq: Two times a day (BID) | ORAL | 3 refills | Status: DC

## 2019-12-04 NOTE — Patient Instructions (Signed)
Medication Instructions:  No Changes *If you need a refill on your cardiac medications before your next appointment, please call your pharmacy*   Lab Work: None Ordered If you have labs (blood work) drawn today and your tests are completely normal, you will receive your results only by: Marland Kitchen MyChart Message (if you have MyChart) OR . A paper copy in the mail If you have any lab test that is abnormal or we need to change your treatment, we will call you to review the results.   Testing/Procedures: None Ordered   Follow-Up: At [facility], you and your health needs are our priority.  As part of our continuing mission to provide you with exceptional heart care, we have created designated Provider Care Teams.  These Care Teams include your primary Cardiologist (physician) and Advanced Practice Providers (APPs -  Physician Assistants and Nurse Practitioners) who all work together to provide you with the care you need, when you need it.  We recommend signing up for the patient portal called "MyChart".  Sign up information is provided on this After Visit Summary.  MyChart is used to connect with patients for Virtual Visits (Telemedicine).  Patients are able to view lab/test results, encounter notes, upcoming appointments, etc.  Non-urgent messages can be sent to your provider as well.   To learn more about what you can do with MyChart, go to [email or web address].    Your next appointment:   12 month(s)  The format for your next appointment:   In Person  Provider:   Olga Millers, MD   Other Instructions

## 2020-01-10 ENCOUNTER — Other Ambulatory Visit: Payer: Self-pay

## 2020-01-10 ENCOUNTER — Ambulatory Visit: Payer: Medicare Other | Admitting: Podiatry

## 2020-01-10 DIAGNOSIS — L600 Ingrowing nail: Secondary | ICD-10-CM

## 2020-01-10 DIAGNOSIS — L603 Nail dystrophy: Secondary | ICD-10-CM | POA: Diagnosis not present

## 2020-01-14 ENCOUNTER — Encounter: Payer: Self-pay | Admitting: Podiatry

## 2020-01-14 NOTE — Progress Notes (Signed)
Subjective:  Patient ID: Felicia Cross, female    DOB: Mar 06, 1933,  MRN: [id number]  Chief Complaint  Patient presents with  . Ingrown Toenail    ingrown - right    86 y.o. female presents with the above complaint.  Patient presents with right medial ingrown nail border that has been causing her a lot of pain.  She states that it hurts when ambulating.  She would like to have it removed.  She has had ingrown toenails in the past.  She denies any other acute complaints.  She has tried some self debridement which has not helped.  Her pain scale 7 out of 10 and is dull aching nature   Review of Systems: Negative except as noted in the HPI. Denies N/V/F/Ch.  Past Medical History:  Diagnosis Date  . Anemia 01/18/2016   -reports on and off her whole life, on oral iron in the past remotely per report  . Arthritis   . B12 deficiency 01/18/2016  . CAD (coronary artery disease)   . CVD (cerebrovascular disease)   . Diverticulosis of colon   . H. pylori infection 10/20/2015  . Hyperlipidemia   . Hypertension   . Osteoarthritis 03/07/2006   Qualifier: Diagnosis of  By: Cato Mulligan MD, Bruce      Current Outpatient Medications:  .  amoxicillin (AMOXIL) 500 MG capsule, Take by mouth., Disp: , Rfl:  .  aspirin EC 81 MG tablet, Take 1 tablet (81 mg total) by mouth daily., Disp: , Rfl:  .  cloNIDine (CATAPRES) 0.1 MG tablet, Take 0.1 mg by mouth 2 (two) times daily., Disp: , Rfl:  .  cyanocobalamin 500 MCG tablet, Take 500 mcg by mouth daily., Disp: , Rfl:  .  dimenhyDRINATE (DRAMAMINE PO), Take by mouth. As needed for vertigo, Disp: , Rfl:  .  gabapentin (NEURONTIN) 100 MG capsule, Take 1 capsule by mouth at bedtime, Disp: 90 capsule, Rfl: 0 .  hydrochlorothiazide (HYDRODIURIL) 25 MG tablet, Take 1 tablet by mouth once daily, Disp: 90 tablet, Rfl: 1 .  losartan (COZAAR) 100 MG tablet, Take 1 tablet by mouth once daily, Disp: 90 tablet, Rfl: 3 .  meclizine (ANTIVERT) 25 MG tablet, meclizine 25 mg  tablet  TAKE 1 TABLET BY MOUTH THREE TIMES A DAY AS NEEDED DAILY, Disp: , Rfl:  .  metoprolol tartrate (LOPRESSOR) 25 MG tablet, Take 1 tablet (25 mg total) by mouth 2 (two) times daily., Disp: 180 tablet, Rfl: 3 .  Multiple Vitamin (MULTIVITAMIN WITH MINERALS) TABS, Take 1 tablet by mouth daily. , Disp: , Rfl:  .  simvastatin (ZOCOR) 40 MG tablet, Take 1 tablet (40 mg total) by mouth daily., Disp: 90 tablet, Rfl: 3  Social History   Tobacco Use  Smoking Status Former Smoker  . Packs/day: 1.00  . Years: 30.00  . Pack years: 30.00  . Quit date: 11/01/1980  . Years since quitting: 39.2  Smokeless Tobacco Never Used    Allergies  Allergen Reactions  . Codeine Nausea And Vomiting  . Hydrocodone Nausea And Vomiting   Objective:  There were no vitals filed for this visit. There is no height or weight on file to calculate BMI. Constitutional Well developed. Well nourished.  Vascular Dorsalis pedis pulses palpable bilaterally. Posterior tibial pulses palpable bilaterally. Capillary refill normal to all digits.  No cyanosis or clubbing noted. Pedal hair growth normal.  Neurologic Normal speech. Oriented to person, place, and time. Epicritic sensation to light touch grossly present bilaterally.  Dermatologic Painful  ingrowing nail at medial nail borders of the hallux nail right. No other open wounds. No skin lesions.  Orthopedic: Normal joint ROM without pain or crepitus bilaterally. No visible deformities. No bony tenderness.   Radiographs: None Assessment:   1. Nail dystrophy   2. Ingrown toenail of right foot    Plan:  Patient was evaluated and treated and all questions answered.  Ingrown Nail, right with underlying nail dystrophy -Patient elects to proceed with minor surgery to remove ingrown toenail removal today. Consent reviewed and signed by patient. -Ingrown nail excised. See procedure note. -Educated on post-procedure care including soaking. Written instructions  provided and reviewed. -Patient to follow up in 2 weeks for nail check.  Procedure: Excision of Ingrown Toenail Location: Right 1st toe medial nail borders. Anesthesia: Lidocaine 1% plain; 1.5 mL and Marcaine 0.5% plain; 1.5 mL, digital block. Skin Prep: Betadine. Dressing: Silvadene; telfa; dry, sterile, compression dressing. Technique: Following skin prep, the toe was exsanguinated and a tourniquet was secured at the base of the toe. The affected nail border was freed, split with a nail splitter, and excised. Chemical matrixectomy was then performed with phenol and irrigated out with alcohol. The tourniquet was then removed and sterile dressing applied. Disposition: Patient tolerated procedure well. Patient to return in 2 weeks for follow-up.   No follow-ups on file.

## 2020-01-29 ENCOUNTER — Ambulatory Visit: Payer: Medicare Other | Admitting: Podiatry

## 2020-02-28 ENCOUNTER — Ambulatory Visit: Payer: Medicare Other | Admitting: Family Medicine

## 2020-03-02 ENCOUNTER — Ambulatory Visit: Payer: Medicare Other | Admitting: Family Medicine

## 2020-03-09 ENCOUNTER — Telehealth: Payer: Self-pay | Admitting: *Deleted

## 2020-03-09 ENCOUNTER — Ambulatory Visit (INDEPENDENT_AMBULATORY_CARE_PROVIDER_SITE_OTHER): Payer: Medicare Other | Admitting: Podiatry

## 2020-03-09 ENCOUNTER — Other Ambulatory Visit: Payer: Self-pay

## 2020-03-09 ENCOUNTER — Ambulatory Visit (INDEPENDENT_AMBULATORY_CARE_PROVIDER_SITE_OTHER): Payer: Medicare Other

## 2020-03-09 DIAGNOSIS — M21612 Bunion of left foot: Secondary | ICD-10-CM

## 2020-03-09 DIAGNOSIS — M21611 Bunion of right foot: Secondary | ICD-10-CM

## 2020-03-09 DIAGNOSIS — M778 Other enthesopathies, not elsewhere classified: Secondary | ICD-10-CM

## 2020-03-09 NOTE — Telephone Encounter (Signed)
Patient called requesting a referral for physical therapy. Patient states she was trying to turn on her fire place and fell, it took her an hour to get up. Patient states she believes he needs physical therapy. [phone]

## 2020-03-09 NOTE — Progress Notes (Signed)
   HPI: 86 y.o. female presenting today for evaluation of left foot pain that began approximately 1 week ago.  She states that she has developed some arch pain to the medial aspect of the left foot.  It is progressively getting worse and aggravated with walking and standing.  She denies a history of injury.  She has been taking ibuprofen and Tylenol for the pain.  Past Medical History:  Diagnosis Date  . Anemia 01/18/2016   -reports on and off her whole life, on oral iron in the past remotely per report  . Arthritis   . B12 deficiency 01/18/2016  . CAD (coronary artery disease)   . CVD (cerebrovascular disease)   . Diverticulosis of colon   . H. pylori infection 10/20/2015  . Hyperlipidemia   . Hypertension   . Osteoarthritis 03/07/2006   Qualifier: Diagnosis of  By: Cato Mulligan MD, Bruce       Physical Exam: General: The patient is alert and oriented x3 in no acute distress.  Dermatology: Skin is warm, dry and supple bilateral lower extremities. Negative for open lesions or macerations.  Vascular: Palpable pedal pulses bilaterally. No edema or erythema noted. Capillary refill within normal limits.  Neurological: Epicritic and protective threshold grossly intact bilaterally.   Musculoskeletal Exam: Range of motion within normal limits to all pedal and ankle joints bilateral. Muscle strength 5/5 in all groups bilateral.   Radiographic Exam:  Normal osseous mineralization. Joint spaces preserved. No fracture/dislocation/boney destruction.  Hallux valgus noted with increased intermetatarsal angle noted.  Assessment: 1.  Midtarsal capsulitis left foot   Plan of Care:  1. Patient evaluated. X-Rays reviewed.  2.  Injection of 0.5 cc Celestone Soluspan injected to the medial aspect of the midtarsal joint left 3.  Continue OTC Aleve and Tylenol for the pain 4.  Continue wearing good supportive sneakers 5.  Return to clinic as needed      Felecia Shelling, DPM [facility]  Dr. Felecia Shelling, DPM    [address] [place], [place] [address]                Office [phone]  Fax [phone]

## 2020-03-10 NOTE — Telephone Encounter (Signed)
Spoke with pt this morning, advised that she needs to schedule appointment with Dr Salomon Fick for further evaluation for the fall and for a referral to PT,Pt state that she was on her way to the dentist and that she will call the office back to schedule appointment

## 2020-03-12 ENCOUNTER — Ambulatory Visit: Payer: Medicare Other | Admitting: Podiatry

## 2020-03-12 NOTE — Telephone Encounter (Signed)
Pt is scheduled with Dr Salomon Fick on 03/13/2020

## 2020-03-13 ENCOUNTER — Encounter: Payer: Self-pay | Admitting: Family Medicine

## 2020-03-13 ENCOUNTER — Ambulatory Visit (INDEPENDENT_AMBULATORY_CARE_PROVIDER_SITE_OTHER): Payer: Medicare Other | Admitting: Family Medicine

## 2020-03-13 ENCOUNTER — Ambulatory Visit (INDEPENDENT_AMBULATORY_CARE_PROVIDER_SITE_OTHER): Payer: Medicare Other

## 2020-03-13 ENCOUNTER — Other Ambulatory Visit: Payer: Self-pay

## 2020-03-13 VITALS — BP 142/96 | HR 71 | Temp 98.2°F | Ht 64.0 in | Wt 188.0 lb

## 2020-03-13 DIAGNOSIS — R42 Dizziness and giddiness: Secondary | ICD-10-CM | POA: Diagnosis not present

## 2020-03-13 DIAGNOSIS — I10 Essential (primary) hypertension: Secondary | ICD-10-CM | POA: Diagnosis not present

## 2020-03-13 DIAGNOSIS — H6123 Impacted cerumen, bilateral: Secondary | ICD-10-CM

## 2020-03-13 DIAGNOSIS — Z96652 Presence of left artificial knee joint: Secondary | ICD-10-CM

## 2020-03-13 DIAGNOSIS — M25362 Other instability, left knee: Secondary | ICD-10-CM

## 2020-03-13 DIAGNOSIS — Z471 Aftercare following joint replacement surgery: Secondary | ICD-10-CM | POA: Diagnosis not present

## 2020-03-13 DIAGNOSIS — R2689 Other abnormalities of gait and mobility: Secondary | ICD-10-CM | POA: Diagnosis not present

## 2020-03-13 NOTE — Progress Notes (Signed)
Subjective:    Patient ID: Felicia Cross, female    DOB: 10-13-1932, 86 y.o.   MRN: [id number]  No chief complaint on file. Pt accompanied by her daughter.  HPI Patient is an 86 yo female with pmh sig for anemia, vertigo, h/o vit B12 def, OA, HLD, HTN, CAD who was seen for acute concern.  Pt states her L knee gave out on last Thursday (8 days ago).  Pt denies injury.  Had a L TKR in the 1990s?  Pt notes intermittent dizziness due to vertigo x yrs.  Pt tried antivert.  Drinking some water daily.  Notes elevation in bp.  Seen 1 wk ago by Podiatry, had injection in L foot.  Pt's daughter expresses concerns that pt is unstable when walking.  Past Medical History:  Diagnosis Date  . Anemia 01/18/2016   -reports on and off her whole life, on oral iron in the past remotely per report  . Arthritis   . B12 deficiency 01/18/2016  . CAD (coronary artery disease)   . CVD (cerebrovascular disease)   . Diverticulosis of colon   . H. pylori infection 10/20/2015  . Hyperlipidemia   . Hypertension   . Osteoarthritis 03/07/2006   Qualifier: Diagnosis of  By: Cato Mulligan MD, Bruce      Allergies  Allergen Reactions  . Codeine Nausea And Vomiting  . Hydrocodone Nausea And Vomiting    ROS General: Denies fever, chills, night sweats, changes in weight, changes in appetite  +vertigo, balance issues HEENT: Denies headaches, ear pain, changes in vision, rhinorrhea, sore throat CV: Denies CP, palpitations, SOB, orthopnea Pulm: Denies SOB, cough, wheezing GI: Denies abdominal pain, nausea, vomiting, diarrhea, constipation GU: Denies dysuria, hematuria, frequency, vaginal discharge Msk: Denies muscle cramps, joint pains  +L knee pain, instability Neuro: Denies weakness, numbness, tingling Skin: Denies rashes, bruising Psych: Denies depression, anxiety, hallucinations    Objective:    Blood pressure (!) 142/96, pulse 71, temperature 98.2 F (36.8 C), temperature source Oral, height 5\' 4"  (1.626 m),  weight 188 lb (85.3 kg), SpO2 99 %.  Gen. Pleasant, well-nourished, in no distress, normal affect   HEENT: [place]/AT, face symmetric, conjunctiva clear, no scleral icterus, PERRLA, EOMI, nares patent without drainage.  Normal external ears, b/l canals occluded with cerumen.  TMs normal after irrigation, peeling skin in L canal. Lungs: no accessory muscle use, CTAB, no wheezes or rales Cardiovascular: RRR, no m/r/g, no peripheral edema Abdomen: BS present, soft, NT/ND, no hepatosplenomegaly. Musculoskeletal: No TTP of L knee, no deformities, no cyanosis or clubbing, normal tone Neuro:  A&Ox3, CN II-XII intact, normal gait Skin:  Warm, no lesions/ rash   Wt Readings from Last 3 Encounters:  12/04/19 189 lb 3.2 oz (85.8 kg)  10/21/19 190 lb (86.2 kg)  09/19/19 191 lb (86.6 kg)    Lab Results  Component Value Date   WBC 5.6 10/21/2019   HGB 12.1 10/21/2019   HCT 36.4 10/21/2019   PLT 279.0 10/21/2019   GLUCOSE 89 10/21/2019   CHOL 156 10/21/2019   TRIG 63.0 10/21/2019   HDL 65.00 10/21/2019   LDLDIRECT 138.1 05/19/2011   LDLCALC 79 10/21/2019   ALT 16 11/05/2018   AST 26 11/05/2018   NA 139 10/21/2019   K 4.0 10/21/2019   CL 101 10/21/2019   CREATININE 1.11 10/21/2019   BUN 17 10/21/2019   CO2 31 10/21/2019   TSH 2.25 10/15/2015   INR 1.04 09/04/2015   HGBA1C 6.0 10/21/2019    Assessment/Plan:  Knee instability, left -Concern hardware s/p left TKR in need of replacement -Supportive care -We will obtain imaging - Plan: DG Knee Complete 4 Views Left, referral to orthopedic surgery  Vertigo -Discussed supportive care including increasing p.o. hydration -Discussed vestibular rehab.  Referral placed - Plan: Ambulatory referral to Physical Therapy  Essential hypertension -Elevated -Discussed lifestyle modifications -Patient encouraged to check BP at home and keep a log to bring with her to clinic -Continue current meds including clonidine 0.1 mg, hydrochlorothiazide 25  mg, losartan 100 mg, Lopressor 25 mg twice daily.  Balance problem  - Plan: Ambulatory referral to Physical Therapy  Bilateral impacted cerumen -consent obtained.  B/l ears irrigated.  Pt tolerated procedure well.  F/u prn  Abbe Amsterdam, MD

## 2020-03-13 NOTE — Patient Instructions (Addendum)
Managing Your Hypertension Hypertension is commonly called high blood pressure. This is when the force of your blood pressing against the walls of your arteries is too strong. Arteries are blood vessels that carry blood from your heart throughout your body. Hypertension forces the heart to work harder to pump blood, and may cause the arteries to become narrow or stiff. Having untreated or uncontrolled hypertension can cause heart attack, stroke, kidney disease, and other problems. What are blood pressure readings? A blood pressure reading consists of a higher number over a lower number. Ideally, your blood pressure should be below 120/80. The first ("top") number is called the systolic pressure. It is a measure of the pressure in your arteries as your heart beats. The second ("bottom") number is called the diastolic pressure. It is a measure of the pressure in your arteries as the heart relaxes. What does my blood pressure reading mean? Blood pressure is classified into four stages. Based on your blood pressure reading, your health care provider may use the following stages to determine what type of treatment you need, if any. Systolic pressure and diastolic pressure are measured in a unit called mm Hg. Normal  Systolic pressure: below 120.  Diastolic pressure: below 80. Elevated  Systolic pressure: 120-129.  Diastolic pressure: below 80. Hypertension stage 1  Systolic pressure: 130-139.  Diastolic pressure: 80-89. Hypertension stage 2  Systolic pressure: 140 or above.  Diastolic pressure: 90 or above. What health risks are associated with hypertension? Managing your hypertension is an important responsibility. Uncontrolled hypertension can lead to:  A heart attack.  A stroke.  A weakened blood vessel (aneurysm).  Heart failure.  Kidney damage.  Eye damage.  Metabolic syndrome.  Memory and concentration problems. What changes can I make to manage my  hypertension? Hypertension can be managed by making lifestyle changes and possibly by taking medicines. Your health care provider will help you make a plan to bring your blood pressure within a normal range. Eating and drinking   Eat a diet that is high in fiber and potassium, and low in salt (sodium), added sugar, and fat. An example eating plan is called the DASH (Dietary Approaches to Stop Hypertension) diet. To eat this way: ? Eat plenty of fresh fruits and vegetables. Try to fill half of your plate at each meal with fruits and vegetables. ? Eat whole grains, such as whole wheat pasta, brown rice, or whole grain bread. Fill about one quarter of your plate with whole grains. ? Eat low-fat diary products. ? Avoid fatty cuts of meat, processed or cured meats, and poultry with skin. Fill about one quarter of your plate with lean proteins such as fish, chicken without skin, beans, eggs, and tofu. ? Avoid premade and processed foods. These tend to be higher in sodium, added sugar, and fat.  Reduce your daily sodium intake. Most people with hypertension should eat less than 1,500 mg of sodium a day.  Limit alcohol intake to no more than 1 drink a day for nonpregnant women and 2 drinks a day for men. One drink equals 12 oz of beer, 5 oz of wine, or 1 oz of hard liquor. Lifestyle  Work with your health care provider to maintain a healthy body weight, or to lose weight. Ask what an ideal weight is for you.  Get at least 30 minutes of exercise that causes your heart to beat faster (aerobic exercise) most days of the week. Activities may include walking, swimming, or biking.  Include exercise   to strengthen your muscles (resistance exercise), such as weight lifting, as part of your weekly exercise routine. Try to do these types of exercises for 30 minutes at least 3 days a week.  Do not use any products that contain nicotine or tobacco, such as cigarettes and e-cigarettes. If you need help quitting,  ask your health care provider.  Control any long-term (chronic) conditions you have, such as high cholesterol or diabetes. Monitoring  Monitor your blood pressure at home as told by your health care provider. Your personal target blood pressure may vary depending on your medical conditions, your age, and other factors.  Have your blood pressure checked regularly, as often as told by your health care provider. Working with your health care provider  Review all the medicines you take with your health care provider because there may be side effects or interactions.  Talk with your health care provider about your diet, exercise habits, and other lifestyle factors that may be contributing to hypertension.  Visit your health care provider regularly. Your health care provider can help you create and adjust your plan for managing hypertension. Will I need medicine to control my blood pressure? Your health care provider may prescribe medicine if lifestyle changes are not enough to get your blood pressure under control, and if:  Your systolic blood pressure is 130 or higher.  Your diastolic blood pressure is 80 or higher. Take medicines only as told by your health care provider. Follow the directions carefully. Blood pressure medicines must be taken as prescribed. The medicine does not work as well when you skip doses. Skipping doses also puts you at risk for problems. Contact a health care provider if:  You think you are having a reaction to medicines you have taken.  You have repeated (recurrent) headaches.  You feel dizzy.  You have swelling in your ankles.  You have trouble with your vision. Get help right away if:  You develop a severe headache or confusion.  You have unusual weakness or numbness, or you feel faint.  You have severe pain in your chest or abdomen.  You vomit repeatedly.  You have trouble breathing. Summary  Hypertension is when the force of blood pumping  through your arteries is too strong. If this condition is not controlled, it may put you at risk for serious complications.  Your personal target blood pressure may vary depending on your medical conditions, your age, and other factors. For most people, a normal blood pressure is less than 120/80.  Hypertension is managed by lifestyle changes, medicines, or both. Lifestyle changes include weight loss, eating a healthy, low-sodium diet, exercising more, and limiting alcohol. This information is not intended to replace advice given to you by your health care provider. Make sure you discuss any questions you have with your health care provider. Document Revised: 08/17/2018 Document Reviewed: 03/23/2016 Elsevier Patient Education  2020 [employer].  Vertigo Vertigo is the feeling that you or the things around you are moving when they are not. This feeling can come and go at any time. Vertigo often goes away on its own. This condition can be dangerous if it happens when you are doing activities like driving or working with machines. Your doctor will do tests to find the cause of your vertigo. These tests will also help your doctor decide on the best treatment for you. Follow these instructions at home: Eating and drinking      Drink enough fluid to keep your pee (urine) pale yellow.  Do not drink alcohol. Activity  Return to your normal activities as told by your doctor. Ask your doctor what activities are safe for you.  In the morning, first sit up on the side of the bed. When you feel okay, stand slowly while you hold onto something until you know that your balance is fine.  Move slowly. Avoid sudden body or head movements or certain positions, as told by your doctor.  Use a cane if you have trouble standing or walking.  Sit down right away if you feel dizzy.  Avoid doing any tasks or activities that can cause danger to you or others if you get dizzy.  Avoid bending down if you feel  dizzy. Place items in your home so that they are easy for you to reach without leaning over.  Do not drive or use heavy machinery if you feel dizzy. General instructions  Take over-the-counter and prescription medicines only as told by your doctor.  Keep all follow-up visits as told by your doctor. This is important. Contact a doctor if:  Your medicine does not help your vertigo.  You have a fever.  Your problems get worse or you have new symptoms.  Your family or friends see changes in your behavior.  The feeling of being sick to your stomach gets worse.  Your vomiting gets worse.  You lose feeling (have numbness) in part of your body.  You feel prickling and tingling in a part of your body. Get help right away if:  You have trouble moving or talking.  You are always dizzy.  You pass out (faint).  You get very bad headaches.  You feel weak in your hands, arms, or legs.  You have changes in your hearing.  You have changes in how you see (vision).  You get a stiff neck.  Bright light starts to bother you. Summary  Vertigo is the feeling that you or the things around you are moving when they are not.  Your doctor will do tests to find the cause of your vertigo.  You may be told to avoid some tasks, positions, or movements.  Contact a doctor if your medicine is not helping, or if you have a fever, new symptoms, or a change in behavior.  Get help right away if you get very bad headaches, or if you have changes in how you speak, hear, or see. This information is not intended to replace advice given to you by your health care provider. Make sure you discuss any questions you have with your health care provider. Document Revised: 03/19/2018 Document Reviewed: 03/19/2018 Elsevier Patient Education  2020 [employer]  Earwax Buildup, Adult The ears produce a substance called earwax that helps keep bacteria out of the ear and protects the skin in the ear canal.  Occasionally, earwax can build up in the ear and cause discomfort or hearing loss. What increases the risk? This condition is more likely to develop in people who:  Are female.  Are elderly.  Naturally produce more earwax.  Clean their ears often with cotton swabs.  Use earplugs often.  Use in-ear headphones often.  Wear hearing aids.  Have narrow ear canals.  Have earwax that is overly thick or sticky.  Have eczema.  Are dehydrated.  Have excess hair in the ear canal. What are the signs or symptoms? Symptoms of this condition include:  Reduced or muffled hearing.  A feeling of fullness in the ear or feeling that the ear is plugged.  Fluid coming from the ear.  Ear pain.  Ear itch.  Ringing in the ear.  Coughing.  An obvious piece of earwax that can be seen inside the ear canal. How is this diagnosed? This condition may be diagnosed based on:  Your symptoms.  Your medical history.  An ear exam. During the exam, your health care provider will look into your ear with an instrument called an otoscope. You may have tests, including a hearing test. How is this treated? This condition may be treated by:  Using ear drops to soften the earwax.  Having the earwax removed by a health care provider. The health care provider may: ? Flush the ear with water. ? Use an instrument that has a loop on the end (curette). ? Use a suction device.  Surgery to remove the wax buildup. This may be done in severe cases. Follow these instructions at home:   Take over-the-counter and prescription medicines only as told by your health care provider.  Do not put any objects, including cotton swabs, into your ear. You can clean the opening of your ear canal with a washcloth or facial tissue.  Follow instructions from your health care provider about cleaning your ears. Do not over-clean your ears.  Drink enough fluid to keep your urine clear or pale yellow. This will help to  thin the earwax.  Keep all follow-up visits as told by your health care provider. If earwax builds up in your ears often or if you use hearing aids, consider seeing your health care provider for routine, preventive ear cleanings. Ask your health care provider how often you should schedule your cleanings.  If you have hearing aids, clean them according to instructions from the manufacturer and your health care provider. Contact a health care provider if:  You have ear pain.  You develop a fever.  You have blood, pus, or other fluid coming from your ear.  You have hearing loss.  You have ringing in your ears that does not go away.  Your symptoms do not improve with treatment.  You feel like the room is spinning (vertigo). Summary  Earwax can build up in the ear and cause discomfort or hearing loss.  The most common symptoms of this condition include reduced or muffled hearing and a feeling of fullness in the ear or feeling that the ear is plugged.  This condition may be diagnosed based on your symptoms, your medical history, and an ear exam.  This condition may be treated by using ear drops to soften the earwax or by having the earwax removed by a health care provider.  Do not put any objects, including cotton swabs, into your ear. You can clean the opening of your ear canal with a washcloth or facial tissue. This information is not intended to replace advice given to you by your health care provider. Make sure you discuss any questions you have with your health care provider. Document Revised: 04/07/2017 Document Reviewed: 07/06/2016 Elsevier Patient Education  2020 [employer].

## 2020-03-18 ENCOUNTER — Ambulatory Visit: Payer: Medicare Other | Admitting: Podiatry

## 2020-03-18 ENCOUNTER — Other Ambulatory Visit: Payer: Self-pay

## 2020-03-18 DIAGNOSIS — S86812A Strain of other muscle(s) and tendon(s) at lower leg level, left leg, initial encounter: Secondary | ICD-10-CM

## 2020-03-18 DIAGNOSIS — M65969 Unspecified synovitis and tenosynovitis, unspecified lower leg: Secondary | ICD-10-CM

## 2020-03-18 DIAGNOSIS — M659 Synovitis and tenosynovitis, unspecified: Secondary | ICD-10-CM

## 2020-03-19 ENCOUNTER — Encounter: Payer: Self-pay | Admitting: Podiatry

## 2020-03-19 NOTE — Progress Notes (Signed)
Subjective:  Patient ID: Felicia Cross, female    DOB: 1932/12/03,  MRN: [id number]  Chief Complaint  Patient presents with  . Foot Pain    pt states that left foot pain is still present    86 y.o. female presents with the above complaint.  Patient presents with pain to the left anterior medial foot.  Patient states this started from the leg and goes all the way down to the first tarsometatarsal joint.  Patient states it hurts with ambulation.  Patient states the injection that she got from Dr. Logan Bores lasted for day but then it came back again.  Patient said there is some swelling the pain is now going up the leg.  She denies any other acute complaints.  She wants to get it evaluated make sure that there is nothing else going on.   Review of Systems: Negative except as noted in the HPI. Denies N/V/F/Ch.  Past Medical History:  Diagnosis Date  . Anemia 01/18/2016   -reports on and off her whole life, on oral iron in the past remotely per report  . Arthritis   . B12 deficiency 01/18/2016  . CAD (coronary artery disease)   . CVD (cerebrovascular disease)   . Diverticulosis of colon   . H. pylori infection 10/20/2015  . Hyperlipidemia   . Hypertension   . Osteoarthritis 03/07/2006   Qualifier: Diagnosis of  By: Cato Mulligan MD, Bruce      Current Outpatient Medications:  .  aspirin EC 81 MG tablet, Take 1 tablet (81 mg total) by mouth daily., Disp: , Rfl:  .  cloNIDine (CATAPRES) 0.1 MG tablet, Take 0.1 mg by mouth 2 (two) times daily., Disp: , Rfl:  .  cyanocobalamin 500 MCG tablet, Take 500 mcg by mouth daily., Disp: , Rfl:  .  dimenhyDRINATE (DRAMAMINE PO), Take by mouth. As needed for vertigo, Disp: , Rfl:  .  gabapentin (NEURONTIN) 100 MG capsule, Take 1 capsule by mouth at bedtime, Disp: 90 capsule, Rfl: 0 .  hydrochlorothiazide (HYDRODIURIL) 25 MG tablet, Take 1 tablet by mouth once daily, Disp: 90 tablet, Rfl: 1 .  losartan (COZAAR) 100 MG tablet, Take 1 tablet by mouth once  daily, Disp: 90 tablet, Rfl: 3 .  meclizine (ANTIVERT) 25 MG tablet, meclizine 25 mg tablet  TAKE 1 TABLET BY MOUTH THREE TIMES A DAY AS NEEDED DAILY, Disp: , Rfl:  .  metoprolol tartrate (LOPRESSOR) 25 MG tablet, Take 1 tablet (25 mg total) by mouth 2 (two) times daily., Disp: 180 tablet, Rfl: 3 .  Multiple Vitamin (MULTIVITAMIN WITH MINERALS) TABS, Take 1 tablet by mouth daily. , Disp: , Rfl:  .  simvastatin (ZOCOR) 40 MG tablet, Take 1 tablet (40 mg total) by mouth daily., Disp: 90 tablet, Rfl: 3  Social History   Tobacco Use  Smoking Status Former Smoker  . Packs/day: 1.00  . Years: 30.00  . Pack years: 30.00  . Quit date: 11/01/1980  . Years since quitting: 39.4  Smokeless Tobacco Never Used    Allergies  Allergen Reactions  . Codeine Nausea And Vomiting  . Hydrocodone Nausea And Vomiting   Objective:  There were no vitals filed for this visit. There is no height or weight on file to calculate BMI. Constitutional Well developed. Well nourished.  Vascular Dorsalis pedis pulses palpable bilaterally. Posterior tibial pulses palpable bilaterally. Capillary refill normal to all digits.  No cyanosis or clubbing noted. Pedal hair growth normal.  Neurologic Normal speech. Oriented to person, place,  and time. Epicritic sensation to light touch grossly present bilaterally.  Dermatologic Nails well groomed and normal in appearance. No open wounds. No skin lesions.  Orthopedic:  Pain on palpation to the course of the tibialis anterior tendon including the insertion of the first tarsometatarsal joint.  No pain at the midfoot joints were previous injection was given.  No pain along the course of posterior tibial tendon, Achilles tendon, peroneal tendon.   Radiographs: None Assessment:   1. Tibialis anterior tenosynovitis   2. Strain of left tibialis anterior muscle, initial encounter    Plan:  Patient was evaluated and treated and all questions answered.  Left tibialis  anterior tendinitis -I explained to patient the etiology of tendinitis and various treatment options were discussed.  I I discussed with her that this is a pretty rare tendinitis.  She does not recall how this injury came about it may be more chronic in nature.  However I believe that patient will benefit from cam boot immobilization to allow the tendon to heal appropriately.  Given that she has failed one steroid injection given by Dr. Logan Bores I will hold off on any other steroid injection. -Cam boot was dispensed and I have asked her to immobilize it using cam boot at all times when she is walking on it for next 4 weeks.  She states understanding  No follow-ups on file.

## 2020-04-08 ENCOUNTER — Other Ambulatory Visit: Payer: Self-pay | Admitting: Cardiology

## 2020-04-10 ENCOUNTER — Ambulatory Visit: Payer: Medicare Other | Admitting: Podiatry

## 2020-04-15 ENCOUNTER — Other Ambulatory Visit: Payer: Self-pay

## 2020-04-15 ENCOUNTER — Ambulatory Visit: Payer: Medicare Other | Admitting: Podiatry

## 2020-04-15 DIAGNOSIS — M659 Synovitis and tenosynovitis, unspecified: Secondary | ICD-10-CM

## 2020-04-15 DIAGNOSIS — S86812A Strain of other muscle(s) and tendon(s) at lower leg level, left leg, initial encounter: Secondary | ICD-10-CM | POA: Diagnosis not present

## 2020-04-15 DIAGNOSIS — M65969 Unspecified synovitis and tenosynovitis, unspecified lower leg: Secondary | ICD-10-CM

## 2020-04-17 ENCOUNTER — Encounter: Payer: Self-pay | Admitting: Podiatry

## 2020-04-17 NOTE — Progress Notes (Signed)
Subjective:  Patient ID: Felicia Cross, female    DOB: 10/22/1932,  MRN: [id number]  Chief Complaint  Patient presents with  . Foot Pain    PT stated that she is doing good she stated that the boot helped and the pain is gone     86 y.o. female presents with the above complaint.  Patient presents with a follow-up of left anterior tibial tendinitis.  Patient states that she is doing much better.  She states she is 100% improved no acute complaints.  She has been ambulating with the boot on for a little while.  Her pain is nonexistent   Review of Systems: Negative except as noted in the HPI. Denies N/V/F/Ch.  Past Medical History:  Diagnosis Date  . Anemia 01/18/2016   -reports on and off her whole life, on oral iron in the past remotely per report  . Arthritis   . B12 deficiency 01/18/2016  . CAD (coronary artery disease)   . CVD (cerebrovascular disease)   . Diverticulosis of colon   . H. pylori infection 10/20/2015  . Hyperlipidemia   . Hypertension   . Osteoarthritis 03/07/2006   Qualifier: Diagnosis of  By: Cato Mulligan MD, Bruce      Current Outpatient Medications:  .  aspirin EC 81 MG tablet, Take 1 tablet (81 mg total) by mouth daily., Disp: , Rfl:  .  cloNIDine (CATAPRES) 0.1 MG tablet, Take 0.1 mg by mouth 2 (two) times daily., Disp: , Rfl:  .  cyanocobalamin 500 MCG tablet, Take 500 mcg by mouth daily., Disp: , Rfl:  .  dimenhyDRINATE (DRAMAMINE PO), Take by mouth. As needed for vertigo, Disp: , Rfl:  .  gabapentin (NEURONTIN) 100 MG capsule, Take 1 capsule by mouth at bedtime, Disp: 90 capsule, Rfl: 0 .  hydrochlorothiazide (HYDRODIURIL) 25 MG tablet, Take 1 tablet by mouth once daily, Disp: 90 tablet, Rfl: 1 .  losartan (COZAAR) 100 MG tablet, Take 1 tablet by mouth once daily, Disp: 90 tablet, Rfl: 3 .  meclizine (ANTIVERT) 25 MG tablet, meclizine 25 mg tablet  TAKE 1 TABLET BY MOUTH THREE TIMES A DAY AS NEEDED DAILY, Disp: , Rfl:  .  metoprolol tartrate (LOPRESSOR) 25  MG tablet, Take 1 tablet (25 mg total) by mouth 2 (two) times daily., Disp: 180 tablet, Rfl: 3 .  Multiple Vitamin (MULTIVITAMIN WITH MINERALS) TABS, Take 1 tablet by mouth daily. , Disp: , Rfl:  .  simvastatin (ZOCOR) 40 MG tablet, Take 1 tablet by mouth once daily, Disp: 90 tablet, Rfl: 0  Social History   Tobacco Use  Smoking Status Former Smoker  . Packs/day: 1.00  . Years: 30.00  . Pack years: 30.00  . Quit date: 11/01/1980  . Years since quitting: 39.4  Smokeless Tobacco Never Used    Allergies  Allergen Reactions  . Codeine Nausea And Vomiting  . Hydrocodone Nausea And Vomiting   Objective:  There were no vitals filed for this visit. There is no height or weight on file to calculate BMI. Constitutional Well developed. Well nourished.  Vascular Dorsalis pedis pulses palpable bilaterally. Posterior tibial pulses palpable bilaterally. Capillary refill normal to all digits.  No cyanosis or clubbing noted. Pedal hair growth normal.  Neurologic Normal speech. Oriented to person, place, and time. Epicritic sensation to light touch grossly present bilaterally.  Dermatologic Nails well groomed and normal in appearance. No open wounds. No skin lesions.  Orthopedic:  No pain on palpation to the course of the tibialis anterior  tendon including the insertion of the first tarsometatarsal joint.  No pain at the midfoot joints were previous injection was given.  No pain along the course of posterior tibial tendon, Achilles tendon, peroneal tendon.   Radiographs: None Assessment:   1. Tibialis anterior tenosynovitis   2. Strain of left tibialis anterior muscle, initial encounter    Plan:  Patient was evaluated and treated and all questions answered.  Left tibialis anterior tendinitis -Completely healed with cam boot immobilization.  At this time given that patient has healed adequately at this time I believe she will benefit from a Tri-Lock ankle brace to help with the  transition to regular shoes.  She has 100% relief.  If any of the foot and ankle issues arise in the future I have asked her to come back and see me.  Patient states understanding No follow-ups on file.

## 2020-04-22 ENCOUNTER — Ambulatory Visit: Payer: Medicare Other | Admitting: Family Medicine

## 2020-05-18 ENCOUNTER — Telehealth: Payer: Self-pay | Admitting: Family Medicine

## 2020-05-18 NOTE — Telephone Encounter (Signed)
FYI:  Pt is calling in stating she was wanting to let Dr. Salomon Fick know that her and her husband has been exposed to COVID but no symptoms  (Granddaughter has COVID but she has her own place and is isolated)

## 2020-05-18 NOTE — Telephone Encounter (Signed)
Left a message for pt to call the office back °

## 2020-05-19 DIAGNOSIS — Z96652 Presence of left artificial knee joint: Secondary | ICD-10-CM | POA: Diagnosis not present

## 2020-05-21 NOTE — Telephone Encounter (Signed)
Spoke with pt state that even though they had been exposed to COVID-19 through the granddaughter, they did not get COVID and have not had any symptoms or concerns. Advised pt to call the office if need anything

## 2020-06-01 ENCOUNTER — Other Ambulatory Visit: Payer: Self-pay | Admitting: Family Medicine

## 2020-06-01 NOTE — Telephone Encounter (Signed)
Pt LOV was on 03/13/2020 and last refill was done o 10/28/2019 for 90 tablets, please advise if ok to refill

## 2020-06-16 DIAGNOSIS — W19XXXA Unspecified fall, initial encounter: Secondary | ICD-10-CM | POA: Diagnosis not present

## 2020-06-16 DIAGNOSIS — Z96652 Presence of left artificial knee joint: Secondary | ICD-10-CM | POA: Diagnosis not present

## 2020-06-19 ENCOUNTER — Other Ambulatory Visit: Payer: Self-pay | Admitting: Cardiology

## 2020-07-04 ENCOUNTER — Other Ambulatory Visit: Payer: Self-pay | Admitting: Family Medicine

## 2020-07-04 DIAGNOSIS — I10 Essential (primary) hypertension: Secondary | ICD-10-CM

## 2020-07-06 ENCOUNTER — Other Ambulatory Visit: Payer: Self-pay | Admitting: Family Medicine

## 2020-07-06 DIAGNOSIS — I10 Essential (primary) hypertension: Secondary | ICD-10-CM

## 2020-07-08 ENCOUNTER — Ambulatory Visit: Payer: Medicare Other | Attending: Physician Assistant

## 2020-07-08 ENCOUNTER — Other Ambulatory Visit: Payer: Self-pay

## 2020-07-08 DIAGNOSIS — R262 Difficulty in walking, not elsewhere classified: Secondary | ICD-10-CM | POA: Diagnosis not present

## 2020-07-08 DIAGNOSIS — M25562 Pain in left knee: Secondary | ICD-10-CM | POA: Diagnosis not present

## 2020-07-08 DIAGNOSIS — M6281 Muscle weakness (generalized): Secondary | ICD-10-CM | POA: Diagnosis present

## 2020-07-08 DIAGNOSIS — R2681 Unsteadiness on feet: Secondary | ICD-10-CM | POA: Insufficient documentation

## 2020-07-08 NOTE — Therapy (Signed)
[facility] [address] [place], [place], [address] Phone: [phone]   Fax:  [phone]  Physical Therapy Evaluation  Patient Details  Name: Felicia Cross MRN: [id number] Date of Birth: Dec 27, 1932 Referring Provider (PT): left knee strengthening   Encounter Date: 07/08/2020   PT End of Session - 07/08/20 0939    Visit Number 1    Number of Visits 13    Date for PT Re-Evaluation 09/02/20   6 week poc, 8 week cert period   Authorization Type [employer] medicare so 10th visit progress    PT Start Time 0933    PT Stop Time 1017    PT Time Calculation (min) 44 min    Activity Tolerance Patient tolerated treatment well;Patient limited by pain    Behavior During Therapy [facility] for tasks assessed/performed           Past Medical History:  Diagnosis Date  . Anemia 01/18/2016   -reports on and off her whole life, on oral iron in the past remotely per report  . Arthritis   . B12 deficiency 01/18/2016  . CAD (coronary artery disease)   . CVD (cerebrovascular disease)   . Diverticulosis of colon   . H. pylori infection 10/20/2015  . Hyperlipidemia   . Hypertension   . Osteoarthritis 03/07/2006   Qualifier: Diagnosis of  By: Cato Mulligan MD, Bruce      Past Surgical History:  Procedure Laterality Date  . ABDOMINAL HYSTERECTOMY  1984  . REPLACEMENT TOTAL KNEE  2007   left  . TOTAL HIP ARTHROPLASTY  2010   Dr. Turner Daniels, right  . TOTAL KNEE ARTHROPLASTY Right 09/14/2015   Procedure: TOTAL KNEE ARTHROPLASTY;  Surgeon: Dannielle Huh, MD;  Location: [facility] OR;  Service: Orthopedics;  Laterality: Right;    There were no vitals filed for this visit.    Subjective Assessment - 07/08/20 0939    Subjective Pt was referred for left knee strengthening. She has history of BTKR years ago with left being 20 years ago. Pt reports that she had a fall in beginning of February on left knee which increased her pain. She feels unsteady since fall so has  been using walker. She was using cane with quad tip prior. She did see her orthopedic, Dr. Valentina Gu, about knee and they did imaging and no injury noted. Pt reports when she fell she must of just tripped over her feet.    Pertinent History PMH: OA, HTN, CAD, anemia, BTKR    Patient Stated Goals Pt wants to be able to walk without walker    Currently in Pain? Yes    Pain Score 8     Pain Location Knee    Pain Orientation Left    Pain Descriptors / Indicators Sharp    Pain Type Acute pain    Pain Onset 1 to 4 weeks ago    Pain Frequency Intermittent    Aggravating Factors  bending knee to get in bed    Pain Relieving Factors icy hot              [facility] PT Assessment - 07/08/20 0945      Assessment   Medical Diagnosis Jola Schmidt, PA for Dr. Valentina Gu    Referring Provider (PT) left knee strengthening    Onset Date/Surgical Date 07/06/20   referral date   Hand Dominance Right      Precautions   Precautions Fall      Balance Screen   Has the  patient fallen in the past 6 months Yes    How many times? 1    Has the patient had a decrease in activity level because of a fear of falling?  No    Is the patient reluctant to leave their home because of a fear of falling?  No      Home Nurse, mental health Private residence    Living Arrangements Spouse/significant other    Available Help at Discharge Family    Type of Home House    Home Access Stairs to enter    Entrance Stairs-Number of Steps 1 brick step    Entrance Stairs-Rails None    Home Layout Two level;Able to live on main level with bedroom/bathroom    Home Equipment Cane - single point;Walker - 2 wheels;Grab bars - tub/shower      Prior Function   Level of Independence Independent with community mobility with device;Independent with basic ADLs    Vocation Retired    Leisure read, walk, was going to [employer] 2x/week-rode bike and did some arm exercises      Sensation   Light Touch Appears Intact      ROM /  Strength   AROM / PROM / Strength Strength      Strength   Strength Assessment Site Hip;Knee;Ankle;Shoulder;Elbow;Hand    Right/Left Shoulder Right;Left    Right Shoulder Flexion 5/5    Left Shoulder Flexion 4/5    Right/Left Elbow Right;Left    Right Elbow Flexion 5/5    Right Elbow Extension 5/5    Left Elbow Flexion 4/5    Left Elbow Extension 4/5    Right/Left hand Right;Left    Right Hand Gross Grasp Functional    Left Hand Gross Grasp Impaired    Right/Left Hip Right;Left    Right Hip Flexion 4+/5    Right Hip ABduction 4+/5    Right Hip ADduction --    Left Hip Flexion 2-/5    Left Hip ABduction 3+/5    Right/Left Knee Right;Left    Right Knee Flexion 5/5    Right Knee Extension 5/5    Left Knee Flexion 4/5    Left Knee Extension 4/5    Right/Left Ankle Right;Left    Right Ankle Dorsiflexion 5/5    Left Ankle Dorsiflexion 4/5      Palpation   Patella mobility Pt denied any pain with patellar mobs in all directions with good mobility. No pain with A/P and P/A tibial glides.    Palpation comment Pt tender along medial and lateral joint lines.      Bed Mobility   Bed Mobility Rolling Right;Rolling Left;Supine to Sit;Sit to Supine    Rolling Right Independent    Rolling Left Independent    Supine to Sit Independent    Sit to Supine Independent   some pain with bringing LLE up     Transfers   Transfers Sit to Stand;Stand to Sit    Sit to Stand 6: Modified independent (Device/Increase time)    Stand to Sit 6: Modified independent (Device/Increase time)      Ambulation/Gait   Ambulation/Gait Yes    Ambulation/Gait Assistance 5: Supervision    Ambulation/Gait Assistance Details Pt had decreased cadence with decreased step length. No antalgic gait noted.    Ambulation Distance (Feet) 75 Feet    Assistive device Rolling walker    Gait Pattern Step-through pattern;Decreased step length - right;Decreased step length - left    Ambulation Surface Level;Indoor  Gait  velocity 34.54 sec=0.2574m/s    Stairs Yes    Stairs Assistance 5: Supervision    Stairs Assistance Details (indicate cue type and reason) Pt denied any pain on steps even with stepping down leading with RLE first    Stair Management Technique Two rails;One rail Left;Alternating pattern;Step to pattern    Number of Stairs 4    Height of Stairs 6      Standardized Balance Assessment   Standardized Balance Assessment Timed Up and Go Test      Timed Up and Go Test   TUG Normal TUG    Normal TUG (seconds) 65                      Objective measurements completed on examination: See above findings.               PT Education - 07/08/20 1208    Education Details Pt instructed on PT plan of care. Discussed transfer to orthopedic PT cllnic    Person(s) Educated Patient    Methods Explanation    Comprehension Verbalized understanding            PT Short Term Goals - 07/08/20 1218      PT SHORT TERM GOAL #1   Title Pt will be independent with HEP for strengthening and balance to continue gains on own.    Time 3    Period Weeks    Status New    Target Date 07/29/20      PT SHORT TERM GOAL #2   Title Pt will decrease TUG from 65 sec to <40 sec for improved balance and functional mobility.    Baseline 07/08/20 65 sec with RW    Time 3    Period Weeks    Status New    Target Date 07/29/20      PT SHORT TERM GOAL #3   Title Pt will ambulate 200' with cane supervision on level surfaces for improved household mobility.    Time 3    Period Weeks    Status New    Target Date 07/29/20             PT Long Term Goals - 07/08/20 1221      PT LONG TERM GOAL #1   Title Pt will increase gait speed from 0.8574m/s to >0.531m/s for improved gait safety in home and short community distances.    Baseline 07/08/20 0.28 m/s    Time 6    Period Weeks    Status New    Target Date 08/19/20      PT LONG TERM GOAL #2   Title Pt will ambulate >400' on varied surfaces with  cane for improved short community distances mod I.    Time 6    Period Weeks    Status New    Target Date 08/19/20      PT LONG TERM GOAL #3   Title Pt will report <5/10 pain in left knee with activities including knee flexion for improved function.    Baseline 07/08/20 8/10    Time 6    Period Weeks    Status New    Target Date 08/19/20                  Plan - 07/08/20 1211    Clinical Impression Statement Pt is 87 y/o female with history of bilateral TKRs years ago. Pt had recent fall on left knee with resulting  increasing pain most noted with flexion and decreased strength on left. Imaging was negative. Pt is now having to walk with walker and had used cane in community prior. Pt had strength deficits at left knee and hip. Pt is ambulating with slow gait speed of 0.63m/s indicating decreased safety with household ambulation. She is also fall risk based on TUG of 65 sec with RW. Pt will benefit from skilled PT to address pain, strength, balance and functional mobility deficits to maximize function.    Personal Factors and Comorbidities Comorbidity 3+    Comorbidities OA, HTN, CAD, anemia    Examination-Activity Limitations Locomotion Level;Stairs;Stand;Transfers    Examination-Participation Restrictions Psychologist, forensic;Yard Work    Conservation officer, historic buildings Stable/Uncomplicated    Clinical Decision Making Moderate    Rehab Potential Good    PT Frequency 2x / week   plus eval   PT Duration 6 weeks    PT Treatment/Interventions ADLs/Self Care Home Management;Moist Heat;Cryotherapy;DME Instruction;Gait training;Stair training;Functional mobility training;Therapeutic activities;Neuromuscular re-education;Balance training;Therapeutic exercise;Patient/family education;Manual techniques;Passive range of motion    PT Next Visit Plan Begin initial HEP for LLE strengthening with focus on ROM that is tolerated at left knee and left hip strengthening especially hip  flexion and abductors. Gait training with RW but can we try cane. She was not antalgic at eval. Begin balance training.    Consulted and Agree with Plan of Care Patient           Patient will benefit from skilled therapeutic intervention in order to improve the following deficits and impairments:  Abnormal gait,Decreased balance,Decreased range of motion,Decreased strength,Decreased mobility,Decreased knowledge of use of DME,Pain  Visit Diagnosis: Difficulty in walking, not elsewhere classified  Muscle weakness (generalized)  Acute pain of left knee  Unsteadiness on feet     Problem List Patient Active Problem List   Diagnosis Date Noted  . Vertigo 07/06/2018  . B12 deficiency 01/18/2016  . Anemia 01/18/2016  . S/P total knee replacement 09/14/2015  . Carotid arterial disease (HCC) 05/27/2009  . Dyslipidemia 06/27/2008  . Coronary atherosclerosis 11/06/2006  . Essential hypertension 03/07/2006  . Osteoarthritis 03/07/2006    Ronn Melena, PT, DPT, NCS 07/08/2020, 12:26 PM  [facility] [address] [place], [place], [address] Phone: [phone]   Fax:  [phone]  Name: Felicia Cross MRN: [id number] Date of Birth: 12/05/1932

## 2020-07-11 ENCOUNTER — Other Ambulatory Visit: Payer: Self-pay | Admitting: Family Medicine

## 2020-07-11 DIAGNOSIS — I10 Essential (primary) hypertension: Secondary | ICD-10-CM

## 2020-07-22 ENCOUNTER — Other Ambulatory Visit: Payer: Self-pay

## 2020-07-22 ENCOUNTER — Ambulatory Visit: Payer: Medicare Other | Admitting: Physical Therapy

## 2020-07-22 ENCOUNTER — Encounter: Payer: Self-pay | Admitting: Physical Therapy

## 2020-07-22 DIAGNOSIS — R262 Difficulty in walking, not elsewhere classified: Secondary | ICD-10-CM

## 2020-07-22 DIAGNOSIS — M25562 Pain in left knee: Secondary | ICD-10-CM

## 2020-07-22 DIAGNOSIS — M6281 Muscle weakness (generalized): Secondary | ICD-10-CM

## 2020-07-22 DIAGNOSIS — R2681 Unsteadiness on feet: Secondary | ICD-10-CM | POA: Diagnosis not present

## 2020-07-22 NOTE — Therapy (Signed)
[facility] [address] [place], [place], [address] Phone: [phone]   Fax:  [phone]  Physical Therapy Treatment  Patient Details  Name: Felicia Cross MRN: [id number] Date of Birth: 1933-02-22 Referring Provider (PT): left knee strengthening   Encounter Date: 07/22/2020   PT End of Session - 07/22/20 1340    Visit Number 2    Number of Visits 13    Date for PT Re-Evaluation 09/02/20    Authorization Type UHC medicare so 10th visit progress    PT Start Time 1150    PT Stop Time 1234    PT Time Calculation (min) 44 min    Activity Tolerance Patient tolerated treatment well    Behavior During Therapy [facility] for tasks assessed/performed           Past Medical History:  Diagnosis Date  . Anemia 01/18/2016   -reports on and off her whole life, on oral iron in the past remotely per report  . Arthritis   . B12 deficiency 01/18/2016  . CAD (coronary artery disease)   . CVD (cerebrovascular disease)   . Diverticulosis of colon   . H. pylori infection 10/20/2015  . Hyperlipidemia   . Hypertension   . Osteoarthritis 03/07/2006   Qualifier: Diagnosis of  By: Cato Mulligan MD, Bruce      Past Surgical History:  Procedure Laterality Date  . ABDOMINAL HYSTERECTOMY  1984  . REPLACEMENT TOTAL KNEE  2007   left  . TOTAL HIP ARTHROPLASTY  2010   Dr. Turner Daniels, right  . TOTAL KNEE ARTHROPLASTY Right 09/14/2015   Procedure: TOTAL KNEE ARTHROPLASTY;  Surgeon: Dannielle Huh, MD;  Location: [facility] OR;  Service: Orthopedics;  Laterality: Right;    There were no vitals filed for this visit.   Subjective Assessment - 07/22/20 1151    Subjective Pt. returns for first follow up since eval. No pain pre-tx. but still with left knee pain with sitting down or "moving it a certin way" (notes pain with knee flexion). Pt. also reports some left hand pain issues-she has seen hand specialist MD so advised follow up with MD and if needing therapy request order  for hand therapist/OT.    Pertinent History PMH: OA, HTN, CAD, anemia, BTKR    Currently in Pain? No/denies                             [facility] Adult PT Treatment/Exercise - 07/22/20 0001      Ambulation/Gait   Ambulation/Gait Yes    Ambulation/Gait Assistance 4: Min assist    Ambulation Distance (Feet) 60 Feet    Assistive device Straight cane    Gait Pattern Decreased step length - left;Decreased step length - right    Ambulation Surface Level;Indoor    Gait Comments Trial gait with SPC-pt. required min assist for balance/safety with decreased bilateral step-length/unable to perform step-through gait pattern and required cues for cane placement in front of body. Pt. was previously ambulatory with quad cane but none available at time of session so discussed could bring her own cane to next session to incorporate furthher gait training      Exercises   Exercises Knee/Hip      Knee/Hip Exercises: Aerobic   Nustep L4 x 5 min UE/LE      Knee/Hip Exercises: Standing   Heel Raises Both;1 set;15 reps    Heel Raises Limitations Airex    Hip Flexion Limitations alternating marches  on Airex x 20 reps with bilat. hand support    Terminal Knee Extension AROM;Strengthening;Left;15 reps    Theraband Level (Terminal Knee Extension) Level 3 (Green)    Abduction Limitations attempted but pt. had difficulty with balance and form so held/performed abduction strengthening in sitting    Other Standing Knee Exercises Romberg eyes open feet apart on Airex 20 sec x 3 with CGA      Knee/Hip Exercises: Seated   Long Arc Quad AROM;Strengthening;Left;2 sets;10 reps    Long Arc Quad Weight 3 lbs.    Long [employer] Limitations performed with ankle weight as noted but demonstrated/briefly practiced HEP technique with Green Theraband around both ankles    Clamshell with TheraBand Green   2x10   Hamstring Curl AROM;Strengthening;Left;2 sets;10 reps    Sit to [employer] 2 sets;5 reps;with UE support       Knee/Hip Exercises: Supine   Short Arc Quad Sets AROM;Strengthening;Left;2 sets;10 reps    Short [employer] Limitations 2 lbs.      Manual Therapy   Manual Therapy Soft tissue mobilization    Soft tissue mobilization brief foam roll use left quadricep (soft roll)                  PT Education - 07/22/20 1340    Education Details HEP, gait, exercises    Person(s) Educated Patient    Methods Explanation;Demonstration;Verbal cues;Handout    Comprehension Returned demonstration;Verbalized understanding;Need further instruction;Verbal cues required            PT Short Term Goals - 07/08/20 1218      PT SHORT TERM GOAL #1   Title Pt will be independent with HEP for strengthening and balance to continue gains on own.    Time 3    Period Weeks    Status New    Target Date 07/29/20      PT SHORT TERM GOAL #2   Title Pt will decrease TUG from 65 sec to <40 sec for improved balance and functional mobility.    Baseline 07/08/20 65 sec with RW    Time 3    Period Weeks    Status New    Target Date 07/29/20      PT SHORT TERM GOAL #3   Title Pt will ambulate 200' with cane supervision on level surfaces for improved household mobility.    Time 3    Period Weeks    Status New    Target Date 07/29/20             PT Long Term Goals - 07/08/20 1221      PT LONG TERM GOAL #1   Title Pt will increase gait speed from 0.22m/s to >0.54m/s for improved gait safety in home and short community distances.    Baseline 07/08/20 0.28 m/s    Time 6    Period Weeks    Status New    Target Date 08/19/20      PT LONG TERM GOAL #2   Title Pt will ambulate >400' on varied surfaces with cane for improved short community distances mod I.    Time 6    Period Weeks    Status New    Target Date 08/19/20      PT LONG TERM GOAL #3   Title Pt will report <5/10 pain in left knee with activities including knee flexion for improved function.    Baseline 07/08/20 8/10    Time 6  Period Weeks    Status New    Target Date 08/19/20                 Plan - 07/22/20 1340    Clinical Impression Statement Worked on knee and hip strengthening as noted per flowsheet. Pt. had balance issues with attempted standing hip abduction at counter so modified for seated strengthening for this for safety. Trial gait with SPC (no quad cane available but discussed could bring to future appointment) with pt. requiring min assist for safety. She demonstrated decreased gait speed and steplength with both RW and cane and based on balance issues/fall risk recommend continued use RW for now. Exercises were well tolerated without any significant pain reported but with muscle weakness needing continued PT to address to help improve safety.    Personal Factors and Comorbidities Comorbidity 3+    Comorbidities OA, HTN, CAD, anemia    Examination-Activity Limitations Locomotion Level;Stairs;Stand;Transfers    Examination-Participation Restrictions Psychologist, forensic;Yard Work    Conservation officer, historic buildings Stable/Uncomplicated    Clinical Decision Making Moderate    Rehab Potential Good    PT Frequency 2x / week   plus eval   PT Duration 6 weeks    PT Treatment/Interventions ADLs/Self Care Home Management;Moist Heat;Cryotherapy;DME Instruction;Gait training;Stair training;Functional mobility training;Therapeutic activities;Neuromuscular re-education;Balance training;Therapeutic exercise;Patient/family education;Manual techniques;Passive range of motion    PT Next Visit Plan Focus knee/LE strengthening with open and closed chain exercises as tolerated, conside adding step up with 2-4 in. step, gait training with quad cane if pt. able to bring, balance, manual prn left quad    PT Home Exercise Plan Access code: [email or web address]    Consulted and Agree with Plan of Care Patient           Patient will benefit from skilled therapeutic intervention in order to improve the following deficits  and impairments:  Abnormal gait,Decreased balance,Decreased range of motion,Decreased strength,Decreased mobility,Decreased knowledge of use of DME,Pain  Visit Diagnosis: Acute pain of left knee  Difficulty in walking, not elsewhere classified  Muscle weakness (generalized)  Unsteadiness on feet     Problem List Patient Active Problem List   Diagnosis Date Noted  . Vertigo 07/06/2018  . B12 deficiency 01/18/2016  . Anemia 01/18/2016  . S/P total knee replacement 09/14/2015  . Carotid arterial disease (HCC) 05/27/2009  . Dyslipidemia 06/27/2008  . Coronary atherosclerosis 11/06/2006  . Essential hypertension 03/07/2006  . Osteoarthritis 03/07/2006   Lazarus Gowda, PT, DPT 07/22/20 2:22 PM  [facility] [address] [place], [place], [address] Phone: [phone]   Fax:  [phone]  Name: SEPHIRA ZELLMAN MRN: [id number] Date of Birth: November 11, 1932

## 2020-07-22 NOTE — Therapy (Signed)
[facility] [address] [place], [place], [address] Phone: [phone]   Fax:  [phone]  Physical Therapy Treatment  Patient Details  Name: Felicia Cross MRN: [id number] Date of Birth: Aug 01, 1932 Referring Provider (PT): left knee strengthening   Encounter Date: 07/22/2020   PT End of Session - 07/22/20 1340    Visit Number 2    Number of Visits 13    Date for PT Re-Evaluation 09/02/20    Authorization Type UHC medicare so 10th visit progress    PT Start Time 1150    PT Stop Time 1234    PT Time Calculation (min) 44 min    Activity Tolerance Patient tolerated treatment well    Behavior During Therapy [facility] for tasks assessed/performed           Past Medical History:  Diagnosis Date  . Anemia 01/18/2016   -reports on and off her whole life, on oral iron in the past remotely per report  . Arthritis   . B12 deficiency 01/18/2016  . CAD (coronary artery disease)   . CVD (cerebrovascular disease)   . Diverticulosis of colon   . H. pylori infection 10/20/2015  . Hyperlipidemia   . Hypertension   . Osteoarthritis 03/07/2006   Qualifier: Diagnosis of  By: Cato Mulligan MD, Bruce      Past Surgical History:  Procedure Laterality Date  . ABDOMINAL HYSTERECTOMY  1984  . REPLACEMENT TOTAL KNEE  2007   left  . TOTAL HIP ARTHROPLASTY  2010   Dr. Turner Daniels, right  . TOTAL KNEE ARTHROPLASTY Right 09/14/2015   Procedure: TOTAL KNEE ARTHROPLASTY;  Surgeon: Dannielle Huh, MD;  Location: [facility] OR;  Service: Orthopedics;  Laterality: Right;    There were no vitals filed for this visit.   Subjective Assessment - 07/22/20 1151    Subjective Pt. returns for first follow up since eval. No pain pre-tx. but still with left knee pain with sitting down or "moving it a certin way" (notes pain with knee flexion). Pt. also reports some left hand pain issues-she has seen hand specialist MD so advised follow up with MD and if needing therapy request order  for hand therapist/OT.    Pertinent History PMH: OA, HTN, CAD, anemia, BTKR    Currently in Pain? No/denies                             [facility] Adult PT Treatment/Exercise - 07/22/20 0001      Ambulation/Gait   Ambulation/Gait Yes    Ambulation/Gait Assistance 4: Min assist    Ambulation Distance (Feet) 60 Feet    Assistive device Straight cane    Gait Pattern Decreased step length - left;Decreased step length - right    Ambulation Surface Level;Indoor    Gait Comments Trial gait with SPC-pt. required min assist for balance/safety with decreased bilateral step-length/unable to perform step-through gait pattern and required cues for cane placement in front of body. Pt. was previously ambulatory with quad cane but none available at time of session so discussed could bring her own cane to next session to incorporate furthher gait training      Exercises   Exercises Knee/Hip      Knee/Hip Exercises: Aerobic   Nustep L4 x 5 min UE/LE      Knee/Hip Exercises: Standing   Heel Raises Both;1 set;15 reps    Heel Raises Limitations Airex    Hip Flexion Limitations alternating marches  on Airex x 20 reps with bilat. hand support    Terminal Knee Extension AROM;Strengthening;Left;15 reps    Theraband Level (Terminal Knee Extension) Level 3 (Green)    Abduction Limitations attempted but pt. had difficulty with balance and form so held/performed abduction strengthening in sitting    Other Standing Knee Exercises Romberg eyes open feet apart on Airex 20 sec x 3 with CGA      Knee/Hip Exercises: Seated   Long Arc Quad AROM;Strengthening;Left;2 sets;10 reps    Long Arc Quad Weight 3 lbs.    Long [employer] Limitations performed with ankle weight as noted but demonstrated/briefly practiced HEP technique with Green Theraband around both ankles    Clamshell with TheraBand Green   2x10   Hamstring Curl AROM;Strengthening;Left;2 sets;10 reps    Sit to [employer] 2 sets;5 reps;with UE support       Knee/Hip Exercises: Supine   Short Arc Quad Sets AROM;Strengthening;Left;2 sets;10 reps    Short [employer] Limitations 2 lbs.      Manual Therapy   Manual Therapy Soft tissue mobilization    Soft tissue mobilization brief foam roll use left quadricep (soft roll)                  PT Education - 07/22/20 1340    Education Details HEP, gait, exercises    Person(s) Educated Patient    Methods Explanation;Demonstration;Verbal cues;Handout    Comprehension Returned demonstration;Verbalized understanding;Need further instruction;Verbal cues required            PT Short Term Goals - 07/08/20 1218      PT SHORT TERM GOAL #1   Title Pt will be independent with HEP for strengthening and balance to continue gains on own.    Time 3    Period Weeks    Status New    Target Date 07/29/20      PT SHORT TERM GOAL #2   Title Pt will decrease TUG from 65 sec to <40 sec for improved balance and functional mobility.    Baseline 07/08/20 65 sec with RW    Time 3    Period Weeks    Status New    Target Date 07/29/20      PT SHORT TERM GOAL #3   Title Pt will ambulate 200' with cane supervision on level surfaces for improved household mobility.    Time 3    Period Weeks    Status New    Target Date 07/29/20             PT Long Term Goals - 07/08/20 1221      PT LONG TERM GOAL #1   Title Pt will increase gait speed from 0.43m/s to >0.78m/s for improved gait safety in home and short community distances.    Baseline 07/08/20 0.28 m/s    Time 6    Period Weeks    Status New    Target Date 08/19/20      PT LONG TERM GOAL #2   Title Pt will ambulate >400' on varied surfaces with cane for improved short community distances mod I.    Time 6    Period Weeks    Status New    Target Date 08/19/20      PT LONG TERM GOAL #3   Title Pt will report <5/10 pain in left knee with activities including knee flexion for improved function.    Baseline 07/08/20 8/10    Time 6  Period Weeks    Status New    Target Date 08/19/20                 Plan - 07/22/20 1340    Clinical Impression Statement Worked on knee and hip strengthening as noted per flowsheet. Pt. had balance issues with attempted standing hip abduction at counter so modified for seated strengthening for this for safety. Trial gait with SPC (no quad cane available but discussed could bring to future appointment) with pt. requiring min assist for safety. She demonstrated decreased gait speed and steplength with both RW and cane and based on balance issues/fall risk recommend continued use RW for now. Exercises were well tolerated without any significant pain reported but with muscle weakness needing continued PT to address to help improve safety.    Personal Factors and Comorbidities Comorbidity 3+    Comorbidities OA, HTN, CAD, anemia    Examination-Activity Limitations Locomotion Level;Stairs;Stand;Transfers    Examination-Participation Restrictions Psychologist, forensic;Yard Work    Conservation officer, historic buildings Stable/Uncomplicated    Clinical Decision Making Moderate    Rehab Potential Good    PT Frequency 2x / week   plus eval   PT Duration 6 weeks    PT Treatment/Interventions ADLs/Self Care Home Management;Moist Heat;Cryotherapy;DME Instruction;Gait training;Stair training;Functional mobility training;Therapeutic activities;Neuromuscular re-education;Balance training;Therapeutic exercise;Patient/family education;Manual techniques;Passive range of motion    PT Next Visit Plan Focus knee/LE strengthening with open and closed chain exercises as tolerated, conside adding step up with 2-4 in. step, gait training with quad cane if pt. able to bring, balance, manual prn left quad    PT Home Exercise Plan Access code: [email or web address]    Consulted and Agree with Plan of Care Patient           Patient will benefit from skilled therapeutic intervention in order to improve the following deficits  and impairments:  Abnormal gait,Decreased balance,Decreased range of motion,Decreased strength,Decreased mobility,Decreased knowledge of use of DME,Pain  Visit Diagnosis: Acute pain of left knee  Difficulty in walking, not elsewhere classified  Muscle weakness (generalized)  Unsteadiness on feet     Problem List Patient Active Problem List   Diagnosis Date Noted  . Vertigo 07/06/2018  . B12 deficiency 01/18/2016  . Anemia 01/18/2016  . S/P total knee replacement 09/14/2015  . Carotid arterial disease (HCC) 05/27/2009  . Dyslipidemia 06/27/2008  . Coronary atherosclerosis 11/06/2006  . Essential hypertension 03/07/2006  . Osteoarthritis 03/07/2006    Lazarus Gowda, PT, DPT 07/22/20 1:47 PM  [facility] [address] [place], [place], [address] Phone: [phone]   Fax:  [phone]  Name: SHARAYAH RENFROW MRN: [id number] Date of Birth: August 22, 1932

## 2020-08-05 ENCOUNTER — Encounter: Payer: Self-pay | Admitting: Physical Therapy

## 2020-08-05 ENCOUNTER — Other Ambulatory Visit: Payer: Self-pay

## 2020-08-05 ENCOUNTER — Ambulatory Visit: Payer: Medicare Other | Admitting: Physical Therapy

## 2020-08-05 DIAGNOSIS — M6281 Muscle weakness (generalized): Secondary | ICD-10-CM | POA: Diagnosis not present

## 2020-08-05 DIAGNOSIS — M25562 Pain in left knee: Secondary | ICD-10-CM | POA: Diagnosis not present

## 2020-08-05 DIAGNOSIS — R2681 Unsteadiness on feet: Secondary | ICD-10-CM | POA: Diagnosis not present

## 2020-08-05 DIAGNOSIS — R262 Difficulty in walking, not elsewhere classified: Secondary | ICD-10-CM | POA: Diagnosis not present

## 2020-08-05 NOTE — Therapy (Signed)
[facility] [address] [place], [place], [address] Phone: [phone]   Fax:  [phone]  Physical Therapy Treatment  Patient Details  Name: Felicia Cross MRN: [id number] Date of Birth: 1932/12/15 Referring Provider (PT): left knee strengthening   Encounter Date: 08/05/2020   PT End of Session - 08/05/20 1106    Visit Number 3    Number of Visits 13    Date for PT Re-Evaluation 09/02/20    Authorization Type UHC medicare so 10th visit progress    PT Start Time 1100    PT Stop Time 1143    PT Time Calculation (min) 43 min    Activity Tolerance Patient tolerated treatment well    Behavior During Therapy [facility] for tasks assessed/performed           Past Medical History:  Diagnosis Date  . Anemia 01/18/2016   -reports on and off her whole life, on oral iron in the past remotely per report  . Arthritis   . B12 deficiency 01/18/2016  . CAD (coronary artery disease)   . CVD (cerebrovascular disease)   . Diverticulosis of colon   . H. pylori infection 10/20/2015  . Hyperlipidemia   . Hypertension   . Osteoarthritis 03/07/2006   Qualifier: Diagnosis of  By: Cato Mulligan MD, Bruce      Past Surgical History:  Procedure Laterality Date  . ABDOMINAL HYSTERECTOMY  1984  . REPLACEMENT TOTAL KNEE  2007   left  . TOTAL HIP ARTHROPLASTY  2010   Dr. Turner Daniels, right  . TOTAL KNEE ARTHROPLASTY Right 09/14/2015   Procedure: TOTAL KNEE ARTHROPLASTY;  Surgeon: Dannielle Huh, MD;  Location: [facility] OR;  Service: Orthopedics;  Laterality: Right;    There were no vitals filed for this visit.   Subjective Assessment - 08/05/20 1104    Subjective No pain pre-tx. but notes still having soreness in left lateral thigh region when crossing legs. She reports trying to use cane at home but feels some unsteadiness with this otherwise using RW.    Pertinent History PMH: OA, HTN, CAD, anemia, BTKR    Patient Stated Goals Pt wants to be able to walk without  walker    Currently in Pain? No/denies                             [facility] Adult PT Treatment/Exercise - 08/05/20 0001      Knee/Hip Exercises: Aerobic   Nustep L5 x 5 min UE/LE      Knee/Hip Exercises: Standing   Heel Raises Both;1 set;15 reps    Heel Raises Limitations Airex    Hip Flexion Limitations alternating marches on Airex x 20 reps with bilat. hand support   10 ea. side bilat.   Terminal Knee Extension AROM;Strengthening;Left;15 reps    Theraband Level (Terminal Knee Extension) Level 3 (Green)    Forward Step Up Right;Left;1 set;10 reps;Hand Hold: 2;Step Height: 4"    Rocker Board 1 minute    Rocker Board Limitations dynamic lateral balance      Knee/Hip Exercises: Seated   Long Arc Quad AROM;Strengthening;Left;2 sets;10 reps    Long Arc Quad Weight 4 lbs.    Clamshell with TheraBand Green   2x10   Hamstring Curl AROM;Strengthening;Left;2 sets;10 reps    Hamstring Limitations green band    Sit to Sand --   1 set of 10 and 2nd set of 5 holding 5 lb. KB from edge of  high low table     Knee/Hip Exercises: Supine   Short Arc Quad Sets AROM;Strengthening;Left;2 sets;10 reps    Short Arc Quad Sets Limitations 3 lbs.      Manual Therapy   Soft tissue mobilization IASTM/foam roll left lateral/posterolateral hip and lateral thigh                    PT Short Term Goals - 07/08/20 1218      PT SHORT TERM GOAL #1   Title Pt will be independent with HEP for strengthening and balance to continue gains on own.    Time 3    Period Weeks    Status New    Target Date 07/29/20      PT SHORT TERM GOAL #2   Title Pt will decrease TUG from 65 sec to <40 sec for improved balance and functional mobility.    Baseline 07/08/20 65 sec with RW    Time 3    Period Weeks    Status New    Target Date 07/29/20      PT SHORT TERM GOAL #3   Title Pt will ambulate 200' with cane supervision on level surfaces for improved household mobility.    Time 3    Period  Weeks    Status New    Target Date 07/29/20             PT Long Term Goals - 07/08/20 1221      PT LONG TERM GOAL #1   Title Pt will increase gait speed from 0.32m/s to >0.10m/s for improved gait safety in home and short community distances.    Baseline 07/08/20 0.28 m/s    Time 6    Period Weeks    Status New    Target Date 08/19/20      PT LONG TERM GOAL #2   Title Pt will ambulate >400' on varied surfaces with cane for improved short community distances mod I.    Time 6    Period Weeks    Status New    Target Date 08/19/20      PT LONG TERM GOAL #3   Title Pt will report <5/10 pain in left knee with activities including knee flexion for improved function.    Baseline 07/08/20 8/10    Time 6    Period Weeks    Status New    Target Date 08/19/20                 Plan - 08/05/20 1134    Clinical Impression Statement Suspect lateral thigh pain associated with crossing legs associated with IT band/lateral hip tightness so included manual and stretches to address. Otherwise continued focus quad strengthening progression with addition today more closed chain exercises with step ups and sit<>stand holding KB with good tolerance. Mild improvement with decreasing need RW at home but otherwise/overall progress with gait ongoing.    Personal Factors and Comorbidities Comorbidity 3+    Comorbidities OA, HTN, CAD, anemia    Examination-Activity Limitations Locomotion Level;Stairs;Stand;Transfers    Examination-Participation Restrictions Psychologist, forensic;Yard Work    Stability/Clinical Decision Making Stable/Uncomplicated    Clinical Decision Making Moderate    Rehab Potential Good    PT Frequency 2x / week   plus   PT Duration 6 weeks    PT Treatment/Interventions ADLs/Self Care Home Management;Moist Heat;Cryotherapy;DME Instruction;Gait training;Stair training;Functional mobility training;Therapeutic activities;Neuromuscular re-education;Balance training;Therapeutic  exercise;Patient/family education;Manual techniques;Passive range of motion    PT Next  Visit Plan check response stretches and manual work for lateral hip/IT band tightness with symptoms for crossing legs, continue strengthening progression and balance/gait as needed    PT Home Exercise Plan Access code: [email or web address]    Consulted and Agree with Plan of Care Patient           Patient will benefit from skilled therapeutic intervention in order to improve the following deficits and impairments:  Abnormal gait,Decreased balance,Decreased range of motion,Decreased strength,Decreased mobility,Decreased knowledge of use of DME,Pain  Visit Diagnosis: Acute pain of left knee  Difficulty in walking, not elsewhere classified  Muscle weakness (generalized)  Unsteadiness on feet     Problem List Patient Active Problem List   Diagnosis Date Noted  . Vertigo 07/06/2018  . B12 deficiency 01/18/2016  . Anemia 01/18/2016  . S/P total knee replacement 09/14/2015  . Carotid arterial disease (HCC) 05/27/2009  . Dyslipidemia 06/27/2008  . Coronary atherosclerosis 11/06/2006  . Essential hypertension 03/07/2006  . Osteoarthritis 03/07/2006    Lazarus Gowda, PT, DPT 08/05/20 12:00 PM  [facility] [address] [place], [place], [address] Phone: [phone]   Fax:  [phone]  Name: Felicia Cross MRN: [id number] Date of Birth: [birth year]

## 2020-08-12 ENCOUNTER — Encounter: Payer: Self-pay | Admitting: Physical Therapy

## 2020-08-12 ENCOUNTER — Ambulatory Visit: Payer: Medicare Other | Attending: Physician Assistant | Admitting: Physical Therapy

## 2020-08-12 ENCOUNTER — Other Ambulatory Visit: Payer: Self-pay

## 2020-08-12 DIAGNOSIS — R262 Difficulty in walking, not elsewhere classified: Secondary | ICD-10-CM | POA: Diagnosis not present

## 2020-08-12 DIAGNOSIS — M25562 Pain in left knee: Secondary | ICD-10-CM | POA: Diagnosis not present

## 2020-08-12 DIAGNOSIS — M6281 Muscle weakness (generalized): Secondary | ICD-10-CM | POA: Insufficient documentation

## 2020-08-12 DIAGNOSIS — R2681 Unsteadiness on feet: Secondary | ICD-10-CM | POA: Diagnosis present

## 2020-08-12 NOTE — Therapy (Signed)
[facility] [address] [place], [place], [address] Phone: [phone]   Fax:  [phone]  Physical Therapy Treatment  Patient Details  Name: Felicia Cross MRN: [id number] Date of Birth: [birth year] Referring Provider (PT): left knee strengthening   Encounter Date: 08/12/2020   PT End of Session - 08/12/20 1211    Visit Number 4    Number of Visits 13    Date for PT Re-Evaluation 09/02/20    Authorization Type UHC medicare so 10th visit progress    PT Start Time 1100    PT Stop Time 1145    PT Time Calculation (min) 45 min    Activity Tolerance Patient tolerated treatment well    Behavior During Therapy [facility] for tasks assessed/performed           Past Medical History:  Diagnosis Date  . Anemia 01/18/2016   -reports on and off her whole life, on oral iron in the past remotely per report  . Arthritis   . B12 deficiency 01/18/2016  . CAD (coronary artery disease)   . CVD (cerebrovascular disease)   . Diverticulosis of colon   . H. pylori infection 10/20/2015  . Hyperlipidemia   . Hypertension   . Osteoarthritis 03/07/2006   Qualifier: Diagnosis of  By: Cato Mulligan MD, Bruce      Past Surgical History:  Procedure Laterality Date  . ABDOMINAL HYSTERECTOMY  1984  . REPLACEMENT TOTAL KNEE  2007   left  . TOTAL HIP ARTHROPLASTY  2010   Dr. Turner Daniels, right  . TOTAL KNEE ARTHROPLASTY Right 09/14/2015   Procedure: TOTAL KNEE ARTHROPLASTY;  Surgeon: Dannielle Huh, MD;  Location: [facility] OR;  Service: Orthopedics;  Laterality: Right;    There were no vitals filed for this visit.   Subjective Assessment - 08/12/20 1115    Subjective Pt. reports soreness with foam roll use to left quadricep region last session but felt that it helped with pain. No pain pre-tx. but reports still feels unsteady with walking.    Pertinent History PMH: OA, HTN, CAD, anemia, BTKR    Patient Stated Goals Pt wants to be able to walk without walker    Currently  in Pain? No/denies              [facility] PT Assessment - 08/12/20 0001      Strength   Left Knee Flexion 5/5    Left Knee Extension 4+/5                         [facility] Adult PT Treatment/Exercise - 08/12/20 0001      Knee/Hip Exercises: Stretches   ITB Stretch Left;3 reps;30 seconds    ITB Stretch Limitations supine manual stretch      Knee/Hip Exercises: Aerobic   Nustep L5 x 5 min UE/LE      Knee/Hip Exercises: Standing   Heel Raises Both;1 set;15 reps    Heel Raises Limitations Airex    Hip Flexion Limitations alternating marches on Airex x 20 reps with bilat. hand support   10 ea. side bilat.   Forward Step Up Right;Left;Hand Hold: 2;Step Height: 4";15 reps    Forward Step Up Limitations 1 set of 10 and second set of 5    Functional Squat 15 reps    Functional Squat Limitations partial squat at counter x 15 reps with chair behind pt. for safety and cueing    Rocker Board 1 minute    Rocker  Board Limitations dynamic lateral balance    Other Standing Knee Exercises Romberg feet together eyes open with SBA 20 sec x 3 at counter      Knee/Hip Exercises: Seated   Long Arc Quad AROM;Strengthening;Left;2 sets;10 reps    Long Arc Quad Weight 4 lbs.    Clamshell with TheraBand Blue   x 20 reps     Manual Therapy   Soft tissue mobilization IASTM/foam roll left lateral/posterolateral hip and lateral thigh                    PT Short Term Goals - 07/08/20 1218      PT SHORT TERM GOAL #1   Title Pt will be independent with HEP for strengthening and balance to continue gains on own.    Time 3    Period Weeks    Status New    Target Date 07/29/20      PT SHORT TERM GOAL #2   Title Pt will decrease TUG from 65 sec to <40 sec for improved balance and functional mobility.    Baseline 07/08/20 65 sec with RW    Time 3    Period Weeks    Status New    Target Date 07/29/20      PT SHORT TERM GOAL #3   Title Pt will ambulate 200' with cane supervision  on level surfaces for improved household mobility.    Time 3    Period Weeks    Status New    Target Date 07/29/20             PT Long Term Goals - 07/08/20 1221      PT LONG TERM GOAL #1   Title Pt will increase gait speed from 0.50m/s to >0.61m/s for improved gait safety in home and short community distances.    Baseline 07/08/20 0.28 m/s    Time 6    Period Weeks    Status New    Target Date 08/19/20      PT LONG TERM GOAL #2   Title Pt will ambulate >400' on varied surfaces with cane for improved short community distances mod I.    Time 6    Period Weeks    Status New    Target Date 08/19/20      PT LONG TERM GOAL #3   Title Pt will report <5/10 pain in left knee with activities including knee flexion for improved function.    Baseline 07/08/20 8/10    Time 6    Period Weeks    Status New    Target Date 08/19/20                 Plan - 08/12/20 1212    Clinical Impression Statement Pt. improving with left knee strength with recheck of MMTs today from baseline and able to continue progression of closed chain strengthening as noted per flowsheet with good tolerance. Also continued foam roll use given benefit noted from this last session for IT band/lateral thigh discomfort. Though knee strength and pain improving still with impaired ability gait/balance with increased fall risk so plan continue PT for further progress to work on improving safety with mobility.    Comorbidities OA, HTN, CAD, anemia    Examination-Activity Limitations Locomotion Level;Stairs;Stand;Transfers    Examination-Participation Restrictions Psychologist, forensic;Yard Work    Stability/Clinical Decision Making Stable/Uncomplicated    Clinical Decision Making Moderate    Rehab Potential Good    PT Frequency 2x /  week    PT Duration 6 weeks    PT Treatment/Interventions ADLs/Self Care Home Management;Moist Heat;Cryotherapy;DME Instruction;Gait training;Stair training;Functional mobility  training;Therapeutic activities;Neuromuscular re-education;Balance training;Therapeutic exercise;Patient/family education;Manual techniques;Passive range of motion    PT Next Visit Plan continue exercise progression as tolerated for knee/hip strengthening, balance/proprioception, gait training with [facility]    PT Home Exercise Plan Access code: [email or web address]    Consulted and Agree with Plan of Care Patient           Patient will benefit from skilled therapeutic intervention in order to improve the following deficits and impairments:  Abnormal gait,Decreased balance,Decreased range of motion,Decreased strength,Decreased mobility,Decreased knowledge of use of DME,Pain  Visit Diagnosis: Acute pain of left knee  Difficulty in walking, not elsewhere classified  Muscle weakness (generalized)  Unsteadiness on feet     Problem List Patient Active Problem List   Diagnosis Date Noted  . Vertigo 07/06/2018  . B12 deficiency 01/18/2016  . Anemia 01/18/2016  . S/P total knee replacement 09/14/2015  . Carotid arterial disease (HCC) 05/27/2009  . Dyslipidemia 06/27/2008  . Coronary atherosclerosis 11/06/2006  . Essential hypertension 03/07/2006  . Osteoarthritis 03/07/2006    Lazarus Gowda, PT, DPT 08/12/20 12:37 PM  [facility] [address] [place], [place], [address] Phone: [phone]   Fax:  [phone]  Name: OLITA TAKESHITA MRN: [id number] Date of Birth: [birth year]

## 2020-08-14 ENCOUNTER — Other Ambulatory Visit: Payer: Self-pay | Admitting: Cardiology

## 2020-08-19 ENCOUNTER — Ambulatory Visit: Payer: Medicare Other | Admitting: Physical Therapy

## 2020-08-19 ENCOUNTER — Encounter: Payer: Self-pay | Admitting: Physical Therapy

## 2020-08-19 ENCOUNTER — Other Ambulatory Visit: Payer: Self-pay

## 2020-08-19 DIAGNOSIS — R262 Difficulty in walking, not elsewhere classified: Secondary | ICD-10-CM | POA: Diagnosis not present

## 2020-08-19 DIAGNOSIS — M25562 Pain in left knee: Secondary | ICD-10-CM

## 2020-08-19 DIAGNOSIS — R2681 Unsteadiness on feet: Secondary | ICD-10-CM

## 2020-08-19 DIAGNOSIS — M6281 Muscle weakness (generalized): Secondary | ICD-10-CM | POA: Diagnosis not present

## 2020-08-19 NOTE — Therapy (Signed)
[facility] [address] [place], [place], [address] Phone: [phone]   Fax:  [phone]  Physical Therapy Treatment  Patient Details  Name: Felicia Cross MRN: [id number] Date of Birth: 05-Dec-1932 Referring Provider (PT): left knee strengthening   Encounter Date: 08/19/2020   PT End of Session - 08/19/20 1105    Visit Number 5    Number of Visits 13    Date for PT Re-Evaluation 09/02/20    Authorization Type [employer] medicare so 10th visit progress    PT Start Time 1100    PT Stop Time 1144    PT Time Calculation (min) 44 min    Equipment Utilized During Treatment Gait belt    Activity Tolerance Patient tolerated treatment well    Behavior During Therapy [facility] for tasks assessed/performed           Past Medical History:  Diagnosis Date  . Anemia 01/18/2016   -reports on and off her whole life, on oral iron in the past remotely per report  . Arthritis   . B12 deficiency 01/18/2016  . CAD (coronary artery disease)   . CVD (cerebrovascular disease)   . Diverticulosis of colon   . H. pylori infection 10/20/2015  . Hyperlipidemia   . Hypertension   . Osteoarthritis 03/07/2006   Qualifier: Diagnosis of  By: Cato Mulligan MD, Bruce      Past Surgical History:  Procedure Laterality Date  . ABDOMINAL HYSTERECTOMY  1984  . REPLACEMENT TOTAL KNEE  2007   left  . TOTAL HIP ARTHROPLASTY  2010   Dr. Turner Daniels, right  . TOTAL KNEE ARTHROPLASTY Right 09/14/2015   Procedure: TOTAL KNEE ARTHROPLASTY;  Surgeon: Dannielle Huh, MD;  Location: [facility] OR;  Service: Orthopedics;  Laterality: Right;    There were no vitals filed for this visit.   Subjective Assessment - 08/19/20 1104    Subjective Left knee pain has improved/not having any lateral thigh pain that was previously noted but gait/balance issues ongoing. Pt. reports did some walking with RW in driveway the past 2 days and tolerated this well.    Pertinent History PMH: OA, HTN, CAD, anemia,  BTKR    Currently in Pain? No/denies                             [facility] Adult PT Treatment/Exercise - 08/19/20 0001      Ambulation/Gait   Ambulation/Gait Assistance Details CGA    Ambulation Distance (Feet) 80 Feet    Assistive device Straight cane    Gait Pattern Step-to pattern;Step-through pattern    Ambulation Surface Level    Gait Comments cues for step to and step-through gait sequence holding cane in RUE      Knee/Hip Exercises: Aerobic   Nustep L5 x 5 min UE/LE      Knee/Hip Exercises: Standing   Heel Raises Both;2 sets;10 reps    Heel Raises Limitations Airex    Hip Flexion Limitations alternating marches on Airex x 20 reps with bilat. hand support   10 ea. side bilat.   Forward Step Up Right;Left;2 sets;10 reps;Hand Hold: 2;Step Height: 4"    Functional Squat 20 reps    Functional Squat Limitations partial squat at counter x 15 reps with chair behind pt. for safety and cueing    Rocker Board 2 minutes    Rocker Board Limitations dynamic balance lateral and fw/rev x 1 min ea.    Other Standing  Knee Exercises stepping over cones x 3 at counter holding cane using cane with opposite UE support on counter back and forth x 2 (approximately 8 feet)    Other Standing Knee Exercises Romberg feet together eyes closed with CGA 20 sec x 3 at counter                  PT Education - 08/19/20 1505    Education Details gait, POC, exercises    Person(s) Educated Patient    Methods Explanation;Demonstration;Verbal cues    Comprehension Verbalized understanding;Returned demonstration;Verbal cues required;Need further instruction            PT Short Term Goals - 07/08/20 1218      PT SHORT TERM GOAL #1   Title Pt will be independent with HEP for strengthening and balance to continue gains on own.    Time 3    Period Weeks    Status New    Target Date 07/29/20      PT SHORT TERM GOAL #2   Title Pt will decrease TUG from 65 sec to <40 sec for improved  balance and functional mobility.    Baseline 07/08/20 65 sec with RW    Time 3    Period Weeks    Status New    Target Date 07/29/20      PT SHORT TERM GOAL #3   Title Pt will ambulate 200' with cane supervision on level surfaces for improved household mobility.    Time 3    Period Weeks    Status New    Target Date 07/29/20             PT Long Term Goals - 07/08/20 1221      PT LONG TERM GOAL #1   Title Pt will increase gait speed from 0.57m/s to >0.75m/s for improved gait safety in home and short community distances.    Baseline 07/08/20 0.28 m/s    Time 6    Period Weeks    Status New    Target Date 08/19/20      PT LONG TERM GOAL #2   Title Pt will ambulate >400' on varied surfaces with cane for improved short community distances mod I.    Time 6    Period Weeks    Status New    Target Date 08/19/20      PT LONG TERM GOAL #3   Title Pt will report <5/10 pain in left knee with activities including knee flexion for improved function.    Baseline 07/08/20 8/10    Time 6    Period Weeks    Status New    Target Date 08/19/20                 Plan - 08/19/20 1506    Clinical Impression Statement Worked on gait training with SPC with assistance and cues as noted per flowsheet. Pt. able to ambulate with CGA but demos decreased step height and length bilaterally with slow gait speed still concerning for high fall risk so continue recommend RW use for safety and plan continue to practice with SPC in formal therapy sessions. Added stepping over cones for work on improving step height and otherwise continued work on LE strengthening and balance. Left knee/quad pain improved with remaining functional limitations more associated with balance and gait impairments.    Personal Factors and Comorbidities Comorbidity 3+    Comorbidities OA, HTN, CAD, anemia    Examination-Activity Limitations Locomotion Level;Stairs;Stand;Transfers  Examination-Participation Restrictions  Psychologist, forensic;Yard Work    Stability/Clinical Decision Making Stable/Uncomplicated    Clinical Decision Making Moderate    Rehab Potential Good    PT Frequency 2x / week    PT Duration 6 weeks    PT Treatment/Interventions ADLs/Self Care Home Management;Moist Heat;Cryotherapy;DME Instruction;Gait training;Stair training;Functional mobility training;Therapeutic activities;Neuromuscular re-education;Balance training;Therapeutic exercise;Patient/family education;Manual techniques;Passive range of motion    PT Next Visit Plan continue exercise progression as tolerated for knee/hip strengthening, balance/proprioception, gait training with [facility]    PT Home Exercise Plan Access code: [email or web address]    Consulted and Agree with Plan of Care Patient           Patient will benefit from skilled therapeutic intervention in order to improve the following deficits and impairments:  Abnormal gait,Decreased balance,Decreased range of motion,Decreased strength,Decreased mobility,Decreased knowledge of use of DME,Pain  Visit Diagnosis: Acute pain of left knee  Difficulty in walking, not elsewhere classified  Muscle weakness (generalized)  Unsteadiness on feet     Problem List Patient Active Problem List   Diagnosis Date Noted  . Vertigo 07/06/2018  . B12 deficiency 01/18/2016  . Anemia 01/18/2016  . S/P total knee replacement 09/14/2015  . Carotid arterial disease (HCC) 05/27/2009  . Dyslipidemia 06/27/2008  . Coronary atherosclerosis 11/06/2006  . Essential hypertension 03/07/2006  . Osteoarthritis 03/07/2006  Lazarus Gowda, PT, DPT 08/19/20 3:11 PM  [facility] [address] [place], [place], [address] Phone: [phone]   Fax:  [phone]  Name: DAYLYN CHRISTINE MRN: [id number] Date of Birth: [birth year]

## 2020-08-20 ENCOUNTER — Emergency Department (HOSPITAL_BASED_OUTPATIENT_CLINIC_OR_DEPARTMENT_OTHER)
Admission: EM | Admit: 2020-08-20 | Discharge: 2020-08-20 | Disposition: A | Discharge status: Home / Self Care | Payer: Medicare Other | Attending: Emergency Medicine | Admitting: Emergency Medicine

## 2020-08-20 ENCOUNTER — Emergency Department (HOSPITAL_BASED_OUTPATIENT_CLINIC_OR_DEPARTMENT_OTHER): Payer: Medicare Other

## 2020-08-20 ENCOUNTER — Encounter (HOSPITAL_BASED_OUTPATIENT_CLINIC_OR_DEPARTMENT_OTHER): Payer: Self-pay | Admitting: Emergency Medicine

## 2020-08-20 ENCOUNTER — Other Ambulatory Visit: Payer: Self-pay

## 2020-08-20 ENCOUNTER — Ambulatory Visit (HOSPITAL_COMMUNITY)
Admission: EM | Admit: 2020-08-20 | Discharge: 2020-08-20 | Disposition: A | Discharge status: Home / Self Care | Payer: Medicare Other

## 2020-08-20 DIAGNOSIS — Z87891 Personal history of nicotine dependence: Secondary | ICD-10-CM | POA: Insufficient documentation

## 2020-08-20 DIAGNOSIS — W1789XA Other fall from one level to another, initial encounter: Secondary | ICD-10-CM | POA: Insufficient documentation

## 2020-08-20 DIAGNOSIS — I10 Essential (primary) hypertension: Secondary | ICD-10-CM | POA: Diagnosis not present

## 2020-08-20 DIAGNOSIS — I2510 Atherosclerotic heart disease of native coronary artery without angina pectoris: Secondary | ICD-10-CM | POA: Insufficient documentation

## 2020-08-20 DIAGNOSIS — S0003XA Contusion of scalp, initial encounter: Secondary | ICD-10-CM | POA: Diagnosis not present

## 2020-08-20 DIAGNOSIS — W19XXXA Unspecified fall, initial encounter: Secondary | ICD-10-CM

## 2020-08-20 DIAGNOSIS — Z043 Encounter for examination and observation following other accident: Secondary | ICD-10-CM | POA: Diagnosis not present

## 2020-08-20 DIAGNOSIS — M25522 Pain in left elbow: Secondary | ICD-10-CM | POA: Diagnosis not present

## 2020-08-20 DIAGNOSIS — S59902A Unspecified injury of left elbow, initial encounter: Secondary | ICD-10-CM | POA: Diagnosis not present

## 2020-08-20 DIAGNOSIS — S51012A Laceration without foreign body of left elbow, initial encounter: Secondary | ICD-10-CM | POA: Diagnosis not present

## 2020-08-20 DIAGNOSIS — Z7982 Long term (current) use of aspirin: Secondary | ICD-10-CM | POA: Insufficient documentation

## 2020-08-20 DIAGNOSIS — Z96651 Presence of right artificial knee joint: Secondary | ICD-10-CM | POA: Insufficient documentation

## 2020-08-20 DIAGNOSIS — M542 Cervicalgia: Secondary | ICD-10-CM | POA: Diagnosis not present

## 2020-08-20 DIAGNOSIS — Z96641 Presence of right artificial hip joint: Secondary | ICD-10-CM | POA: Diagnosis not present

## 2020-08-20 DIAGNOSIS — Z79899 Other long term (current) drug therapy: Secondary | ICD-10-CM | POA: Insufficient documentation

## 2020-08-20 MED ORDER — ACETAMINOPHEN 500 MG PO TABS
ORAL_TABLET | ORAL | Status: AC
  Filled 2020-08-20: qty 2

## 2020-08-20 MED ORDER — ACETAMINOPHEN 500 MG PO TABS
1000.0000 mg | ORAL_TABLET | Freq: Once | ORAL | Status: AC
  Administered 2020-08-20: 1000 mg via ORAL
  Filled 2020-08-20: qty 2

## 2020-08-20 NOTE — ED Provider Notes (Signed)
[facility] EMERGENCY DEPT Provider Note   CSN: [id number] Arrival date & time: 08/20/20  1721     History Chief Complaint  Patient presents with  . Fall    Felicia Cross is a 87 y.o. female.  87 yo F with a chief complaint of a fall.  The patient was getting out of her car to go see her podiatrist and she lost her balance and fell backwards.  Struck the back of her head and is complaining of a headache neck pain and has a skin tear to the left elbow.  Denies any significant pain to the left elbow.  Feels like she has full range of motion.  Denies confusion denies vomiting.  Is on a full dose aspirin every day.  The history is provided by the patient.  Fall This is a new problem. The current episode started yesterday. The problem occurs constantly. The problem has not changed since onset.Associated symptoms include headaches. Pertinent negatives include no chest pain and no shortness of breath. Nothing aggravates the symptoms. Nothing relieves the symptoms. She has tried nothing for the symptoms. The treatment provided no relief.       Past Medical History:  Diagnosis Date  . Anemia 01/18/2016   -reports on and off her whole life, on oral iron in the past remotely per report  . Arthritis   . B12 deficiency 01/18/2016  . CAD (coronary artery disease)   . CVD (cerebrovascular disease)   . Diverticulosis of colon   . H. pylori infection 10/20/2015  . Hyperlipidemia   . Hypertension   . Osteoarthritis 03/07/2006   Qualifier: Diagnosis of  By: Cato Mulligan MD, Bruce      Patient Active Problem List   Diagnosis Date Noted  . Vertigo 07/06/2018  . B12 deficiency 01/18/2016  . Anemia 01/18/2016  . S/P total knee replacement 09/14/2015  . Carotid arterial disease (HCC) 05/27/2009  . Dyslipidemia 06/27/2008  . Coronary atherosclerosis 11/06/2006  . Essential hypertension 03/07/2006  . Osteoarthritis 03/07/2006    Past Surgical History:  Procedure Laterality Date   . ABDOMINAL HYSTERECTOMY  1984  . REPLACEMENT TOTAL KNEE  2007   left  . TOTAL HIP ARTHROPLASTY  2010   Dr. Turner Daniels, right  . TOTAL KNEE ARTHROPLASTY Right 09/14/2015   Procedure: TOTAL KNEE ARTHROPLASTY;  Surgeon: Dannielle Huh, MD;  Location: [facility] OR;  Service: Orthopedics;  Laterality: Right;     OB History   No obstetric history on file.     Family History  Problem Relation Age of Onset  . Diabetes Mother   . Hypertension Father   . Kidney disease Father   . Heart attack Sister   . Heart attack Brother   . Colon cancer Neg Hx   . Stomach cancer Neg Hx   . Esophageal cancer Neg Hx   . Rectal cancer Neg Hx     Social History   Tobacco Use  . Smoking status: Former Smoker    Packs/day: 1.00    Years: 30.00    Pack years: 30.00    Quit date: 11/01/1980    Years since quitting: 39.8  . Smokeless tobacco: Never Used  Substance Use Topics  . Alcohol use: Yes    Alcohol/week: 0.0 standard drinks    Comment: glass of wine  . Drug use: No    Home Medications Prior to Admission medications   Medication Sig Start Date End Date Taking? Authorizing Provider  aspirin EC 81 MG tablet Take 1 tablet (  81 mg total) by mouth daily. 08/18/16   Terressa Koyanagi, DO  cloNIDine (CATAPRES) 0.1 MG tablet Take 0.1 mg by mouth 2 (two) times daily. 07/05/19   [provider]  cyanocobalamin 500 MCG tablet Take 500 mcg by mouth daily.    [provider]  dimenhyDRINATE (DRAMAMINE PO) Take by mouth. As needed for vertigo    [provider]  gabapentin (NEURONTIN) 100 MG capsule Take 1 capsule by mouth at bedtime 06/01/20   Deeann Saint, MD  hydrochlorothiazide (HYDRODIURIL) 25 MG tablet Take 1 tablet by mouth once daily 07/06/20   Deeann Saint, MD  losartan (COZAAR) 100 MG tablet Take 1 tablet by mouth once daily 06/19/20   Lewayne Bunting, MD  meclizine (ANTIVERT) 25 MG tablet meclizine 25 mg tablet  TAKE 1 TABLET BY MOUTH THREE TIMES A DAY AS NEEDED DAILY     [provider]  metoprolol tartrate (LOPRESSOR) 25 MG tablet Take 1 tablet (25 mg total) by mouth 2 (two) times daily. 12/04/19   Lewayne Bunting, MD  Multiple Vitamin (MULTIVITAMIN WITH MINERALS) TABS Take 1 tablet by mouth daily.     [provider]  simvastatin (ZOCOR) 40 MG tablet Take 1 tablet by mouth once daily 08/17/20   Lewayne Bunting, MD    Allergies    Codeine and Hydrocodone  Review of Systems   Review of Systems  Constitutional: Negative for chills and fever.  HENT: Negative for congestion and rhinorrhea.   Eyes: Negative for redness and visual disturbance.  Respiratory: Negative for shortness of breath and wheezing.   Cardiovascular: Negative for chest pain and palpitations.  Gastrointestinal: Negative for nausea and vomiting.  Genitourinary: Negative for dysuria and urgency.  Musculoskeletal: Positive for arthralgias and neck pain. Negative for myalgias.  Skin: Negative for pallor and wound.  Neurological: Positive for headaches. Negative for dizziness.    Physical Exam Updated Vital Signs BP (!) 156/78 (BP Location: Right Arm)   Pulse 81   Temp 98.3 F (36.8 C)   Resp 20   Ht 5\' 4"  (1.626 m)   Wt 86.2 kg   SpO2 100%   BMI 32.61 kg/m   Physical Exam Vitals and nursing note reviewed.  Constitutional:      General: She is not in acute distress.    Appearance: She is well-developed. She is not diaphoretic.  HENT:     Head: Normocephalic.     Comments: Large occipital hematoma Eyes:     Pupils: Pupils are equal, round, and reactive to light.  Cardiovascular:     Rate and Rhythm: Normal rate and regular rhythm.     Heart sounds: No murmur heard. No friction rub. No gallop.   Pulmonary:     Effort: Pulmonary effort is normal.     Breath sounds: No wheezing or rales.  Abdominal:     General: There is no distension.     Palpations: Abdomen is soft.     Tenderness: There is no abdominal tenderness.  Musculoskeletal:        General:  No tenderness.     Cervical back: Normal range of motion and neck supple.     Comments: Small skin tear to the posterior aspect of the elbow.  I am unable to straighten the elbow past about 75 degrees.  Patient seems to think that it is completely straight.  No obvious crepitus or edema.  No significant midline C-spine tenderness.  Patient is able to range her  neck without significant pain.  Skin:    General: Skin is warm and dry.  Neurological:     Mental Status: She is alert and oriented to person, place, and time.  Psychiatric:        Behavior: Behavior normal.     ED Results / Procedures / Treatments   Labs (all labs ordered are listed, but only abnormal results are displayed) Labs Reviewed - No data to display  EKG None  Radiology DG Elbow Complete Left  Result Date: 08/20/2020 CLINICAL DATA:  Fall EXAM: LEFT ELBOW - COMPLETE 3+ VIEW COMPARISON:  None. FINDINGS: No displaced fracture. Possibly minimally impacted fracture of the radial head. Soft tissue injury at the dorsum of the elbow. IMPRESSION: Possibly minimally impacted fracture of the radial head. Soft tissue injury at the dorsum of the elbow. Electronically Signed   By: Deatra Robinson M.D.   On: 08/20/2020 19:13    Procedures Procedures   Medications Ordered in ED Medications  acetaminophen (TYLENOL) tablet 1,000 mg (1,000 mg Oral Given 08/20/20 1826)    ED Course  I have reviewed the triage vital signs and the nursing notes.  Pertinent labs & imaging results that were available during my care of the patient were reviewed by me and considered in my medical decision making (see chart for details).    MDM Rules/Calculators/A&P                          87 yo F with a chief complaints of a fall.  Nonsyncopal by history.  Complaining mostly of a headache and neck pain.  Will obtain a CT scan of the head and C-spine.  Plain film of the left elbow is on unable to fully range it.  Plain film of the elbow viewed by me  with a possible avulsion fracture along the radial head.  Read as radiology is possible impaction fracture.  Will place in a posterior splint sling follow-up with orthopedics.  CT of the head and C-spine are negative.  Discharge home.  9:45 PM:  I have discussed the diagnosis/risks/treatment options with the patient and believe the pt to be eligible for discharge home to follow-up with Ortho. We also discussed returning to the ED immediately if new or worsening sx occur. We discussed the sx which are most concerning (e.g., sudden worsening pain, fever, inability to tolerate by mouth) that necessitate immediate return. Medications administered to the patient during their visit and any new prescriptions provided to the patient are listed below.  Medications given during this visit Medications  acetaminophen (TYLENOL) tablet 1,000 mg (1,000 mg Oral Given 08/20/20 1826)     The patient appears reasonably screen and/or stabilized for discharge and I doubt any other medical condition or other [facility] requiring further screening, evaluation, or treatment in the ED at this time prior to discharge.   Final Clinical Impression(s) / ED Diagnoses Final diagnoses:  Fall, initial encounter  Elbow pain, left    Rx / DC Orders ED Discharge Orders    None       Melene Plan, DO 08/20/20 2145

## 2020-08-20 NOTE — ED Notes (Signed)
2nd dose tylenol pulled from pyxis via override as first dose dropped in the floor and had to be wasted.

## 2020-08-20 NOTE — Discharge Instructions (Signed)
Follow up with ortho in the office.  Return for worsening pain, confusion, vomiting

## 2020-08-20 NOTE — ED Triage Notes (Signed)
Pt fell today while getting out of the car. C/o neck and head pain. Denies LOC. No blood thinners. Abrasion noted to L elbow.

## 2020-08-20 NOTE — ED Notes (Signed)
Pt went to the ED with daughter to ruled out for head trauma.Pt denies headache, vision changes nausea, vomiting, sleepiness, loss of consciousness. Pt reported to  [place],  [place] and [place], [place]

## 2020-08-26 DIAGNOSIS — M19022 Primary osteoarthritis, left elbow: Secondary | ICD-10-CM | POA: Diagnosis not present

## 2020-08-26 DIAGNOSIS — M19029 Primary osteoarthritis, unspecified elbow: Secondary | ICD-10-CM | POA: Insufficient documentation

## 2020-08-26 DIAGNOSIS — S59902A Unspecified injury of left elbow, initial encounter: Secondary | ICD-10-CM | POA: Diagnosis not present

## 2020-09-03 ENCOUNTER — Ambulatory Visit: Payer: Medicare Other | Admitting: Physical Therapy

## 2020-09-03 ENCOUNTER — Ambulatory Visit: Payer: Medicare Other

## 2020-09-07 ENCOUNTER — Ambulatory Visit: Payer: Medicare Other | Attending: Family Medicine | Admitting: Physical Therapy

## 2020-09-07 ENCOUNTER — Other Ambulatory Visit: Payer: Self-pay

## 2020-09-07 DIAGNOSIS — R262 Difficulty in walking, not elsewhere classified: Secondary | ICD-10-CM | POA: Diagnosis present

## 2020-09-07 DIAGNOSIS — M25562 Pain in left knee: Secondary | ICD-10-CM | POA: Insufficient documentation

## 2020-09-07 DIAGNOSIS — R2681 Unsteadiness on feet: Secondary | ICD-10-CM | POA: Diagnosis not present

## 2020-09-07 DIAGNOSIS — M6281 Muscle weakness (generalized): Secondary | ICD-10-CM | POA: Diagnosis present

## 2020-09-07 NOTE — Therapy (Signed)
[place], [place], [address] Phone: [phone]   Fax:  [phone]  Physical Therapy Treatment and Re-Certification  Patient Details  Name: Felicia Cross MRN: [id number] Date of Birth: 1933/03/01 Referring Provider (PT): left knee strengthening   Encounter Date: 09/07/2020   PT End of Session - 09/07/20 1111    Visit Number 6    Number of Visits 13    Date for PT Re-Evaluation 09/02/20    Authorization Type U[employer] medicare so 10th visit progress    PT Start Time 1105    PT Stop Time 1145    PT Time Calculation (min) 40 min    Equipment Utilized During Treatment Gait belt    Activity Tolerance Patient tolerated treatment well    Behavior During Therapy [facility] for tasks assessed/performed           Past Medical History:  Diagnosis Date  . Anemia 01/18/2016   -reports on and off her whole life, on oral iron in the past remotely per report  . Arthritis   . B12 deficiency 01/18/2016  . CAD (coronary artery disease)   . CVD (cerebrovascular disease)   . Diverticulosis of colon   . H. pylori infection 10/20/2015  . Hyperlipidemia   . Hypertension   . Osteoarthritis 03/07/2006   Qualifier: Diagnosis of  By: Leanne Chang MD, Bruce      Past Surgical History:  Procedure Laterality Date  . ABDOMINAL HYSTERECTOMY  1984  . REPLACEMENT TOTAL KNEE  2007   left  . TOTAL HIP ARTHROPLASTY  2010   Dr. Mayer Camel, right  . TOTAL KNEE ARTHROPLASTY Right 09/14/2015   Procedure: TOTAL KNEE ARTHROPLASTY;  Surgeon: Vickey Huger, MD;  Location: [facility];  Service: Orthopedics;  Laterality: Right;    There were no vitals filed for this visit.   Subjective Assessment - 09/07/20 1109    Subjective Pt reports she fell in the parking lot and cracked her elbow and got a head hematoma. Elbow is better but head is tender to the touch. Pt reports no issues with exercises.    Pertinent History PMH: OA, HTN, CAD, anemia, BTKR    Patient  Stated Goals Pt wants to be able to walk without walker    Currently in Pain? No/denies              [facility] PT Assessment - 09/07/20 0001      Standardized Balance Assessment   Standardized Balance Assessment Five Times Sit to Stand    Five times sit to stand comments  30 sec                         [facility] Adult PT Treatment/Exercise - 09/07/20 0001      Ambulation/Gait   Ambulation/Gait Assistance Details CGA    Ambulation Distance (Feet) 200 Feet    Assistive device Straight cane    Gait Pattern Step-to pattern;Step-through pattern   SLOW   Ambulation Surface Level;Indoor    Gait velocity 0.16 m/s    Gait Comments cues for step to and step-through gait sequence holding cane in RUE      Knee/Hip Exercises: Aerobic   Nustep L5 x 5 min UE/LE      Knee/Hip Exercises: Standing   Functional Squat 20 reps    Functional Squat Limitations chair touch    SLS with Vectors forward tap on sidelying cone 2x10 bilat    Other Standing Knee Exercises Romberg  feet together eyes open head nods & shake x10 each; closed with CGA 20 sec at counter; closed with head nods & head shake x10 each      Knee/Hip Exercises: Seated   Sit to Sand 2 sets;5 reps;without UE support      Ambulation   Ambulation/Gait Assistance Details Verbal cues for sequencing;Verbal cues for technique                    PT Short Term Goals - 07/08/20 1218      PT SHORT TERM GOAL #1   Title Pt will be independent with HEP for strengthening and balance to continue gains on own.    Time 3    Period Weeks    Status New    Target Date 07/29/20      PT SHORT TERM GOAL #2   Title Pt will decrease TUG from 65 sec to <40 sec for improved balance and functional mobility.    Baseline 07/08/20 65 sec with RW    Time 3    Period Weeks    Status New    Target Date 07/29/20      PT SHORT TERM GOAL #3   Title Pt will ambulate 200' with cane supervision on level surfaces for improved household  mobility.    Time 3    Period Weeks    Status New    Target Date 07/29/20             PT Long Term Goals - 09/07/20 1123      PT LONG TERM GOAL #1   Title Pt will increase gait speed from 0.68ms to >0.617m for improved gait safety in home and short community distances.    Baseline 07/08/20 0.28 m/s; 09/07/20 0.17 m/s using cane    Time 6    Period Weeks    Status On-going    Target Date 10/19/20      PT LONG TERM GOAL #2   Title Pt will ambulate >400' on varied surfaces with cane for improved short community distances mod I.    Baseline 200' on level ground    Time 6    Period Weeks    Status On-going    Target Date 10/19/20      PT LONG TERM GOAL #3   Title Pt will report <5/10 pain in left knee with activities including knee flexion for improved function.    Baseline 07/08/20 8/10; no pain for the most part 09/07/20    Time 6    Period Weeks    Status Achieved      PT LONG TERM GOAL #4   Title 5x STS <13 sec    Baseline 30 sec    Period Weeks    Status New    Target Date 10/19/20                 Plan - 09/07/20 1158    Clinical Impression Statement Pt returns 2 wks s/p fall. Rechecked LTGs -- pt's knee pain has improved and met this LTGs; however, pt continues to have decreased gait speed and instability on indoor level surfaces for 200'. Pt would benefit from continued PT x6 wks to improve her balance and strength. Pt's 5x STS places her at high risk of falls. Progressed and updated pt's HEP to include balance exercises.    Personal Factors and Comorbidities Comorbidity 3+    Comorbidities OA, HTN, CAD, anemia    Examination-Activity Limitations Locomotion Level;Stairs;Stand;Transfers  Examination-Participation Restrictions Art gallery manager;Yard Work    Stability/Clinical Decision Making Stable/Uncomplicated    Rehab Potential Good    PT Frequency 2x / week    PT Duration 6 weeks    PT Treatment/Interventions ADLs/Self Care Home Management;Moist  Heat;Cryotherapy;DME Instruction;Gait training;Stair training;Functional mobility training;Therapeutic activities;Neuromuscular re-education;Balance training;Therapeutic exercise;Patient/family education;Manual techniques;Passive range of motion    PT Next Visit Plan CHECK FOTO!! continue exercise progression as tolerated for knee/hip strengthening, balance/proprioception, gait training with [facility]    PT Home Exercise Plan Access code: [email or web address]    Consulted and Agree with Plan of Care Patient           Patient will benefit from skilled therapeutic intervention in order to improve the following deficits and impairments:  Abnormal gait,Decreased balance,Decreased range of motion,Decreased strength,Decreased mobility,Decreased knowledge of use of DME,Pain  Visit Diagnosis: Acute pain of left knee - Plan: PT plan of care cert/re-cert  Difficulty in walking, not elsewhere classified - Plan: PT plan of care cert/re-cert  Muscle weakness (generalized) - Plan: PT plan of care cert/re-cert  Unsteadiness on feet - Plan: PT plan of care cert/re-cert     Problem List Patient Active Problem List   Diagnosis Date Noted  . Vertigo 07/06/2018  . B12 deficiency 01/18/2016  . Anemia 01/18/2016  . S/P total knee replacement 09/14/2015  . Carotid arterial disease ([place]) 05/27/2009  . Dyslipidemia 06/27/2008  . Coronary atherosclerosis 11/06/2006  . Essential hypertension 03/07/2006  . Osteoarthritis 03/07/2006    [facility] [address] PT, DPT 09/07/2020, 12:47 PM  [facility] [address] [place], [place], [address] Phone: [phone]   Fax:  [phone]  Name: Felicia Cross MRN: [id number] Date of Birth: 04/17/1933

## 2020-09-09 DIAGNOSIS — S63639D Sprain of interphalangeal joint of unspecified finger, subsequent encounter: Secondary | ICD-10-CM | POA: Diagnosis not present

## 2020-09-09 DIAGNOSIS — M19029 Primary osteoarthritis, unspecified elbow: Secondary | ICD-10-CM | POA: Diagnosis not present

## 2020-09-09 DIAGNOSIS — S63639A Sprain of interphalangeal joint of unspecified finger, initial encounter: Secondary | ICD-10-CM | POA: Diagnosis not present

## 2020-09-09 DIAGNOSIS — S6982XA Other specified injuries of left wrist, hand and finger(s), initial encounter: Secondary | ICD-10-CM | POA: Diagnosis not present

## 2020-09-09 DIAGNOSIS — M79642 Pain in left hand: Secondary | ICD-10-CM | POA: Diagnosis not present

## 2020-09-09 DIAGNOSIS — M25642 Stiffness of left hand, not elsewhere classified: Secondary | ICD-10-CM | POA: Diagnosis not present

## 2020-09-10 ENCOUNTER — Ambulatory Visit: Payer: Medicare Other | Admitting: Physical Therapy

## 2020-09-10 ENCOUNTER — Other Ambulatory Visit: Payer: Self-pay

## 2020-09-10 DIAGNOSIS — M25562 Pain in left knee: Secondary | ICD-10-CM | POA: Diagnosis not present

## 2020-09-10 DIAGNOSIS — M6281 Muscle weakness (generalized): Secondary | ICD-10-CM

## 2020-09-10 DIAGNOSIS — R2681 Unsteadiness on feet: Secondary | ICD-10-CM | POA: Diagnosis not present

## 2020-09-10 DIAGNOSIS — R262 Difficulty in walking, not elsewhere classified: Secondary | ICD-10-CM

## 2020-09-10 NOTE — Therapy (Signed)
[facility] [address] [place], [place], [address] Phone: [phone]   Fax:  [phone]  Physical Therapy Treatment  Patient Details  Name: Felicia Cross MRN: [id number] Date of Birth: [birth year] Referring Provider (PT): left knee strengthening   Encounter Date: 09/10/2020   PT End of Session - 09/10/20 1058    Visit Number 7    Number of Visits 13    Date for PT Re-Evaluation 09/02/20    Authorization Type UHC medicare so 10th visit progress    PT Start Time 1100    PT Stop Time 1145    PT Time Calculation (min) 45 min    Equipment Utilized During Treatment Gait belt    Activity Tolerance Patient tolerated treatment well    Behavior During Therapy [facility] for tasks assessed/performed           Past Medical History:  Diagnosis Date  . Anemia 01/18/2016   -reports on and off her whole life, on oral iron in the past remotely per report  . Arthritis   . B12 deficiency 01/18/2016  . CAD (coronary artery disease)   . CVD (cerebrovascular disease)   . Diverticulosis of colon   . H. pylori infection 10/20/2015  . Hyperlipidemia   . Hypertension   . Osteoarthritis 03/07/2006   Qualifier: Diagnosis of  By: Cato Mulligan MD, Bruce      Past Surgical History:  Procedure Laterality Date  . ABDOMINAL HYSTERECTOMY  1984  . REPLACEMENT TOTAL KNEE  2007   left  . TOTAL HIP ARTHROPLASTY  2010   Dr. Turner Daniels, right  . TOTAL KNEE ARTHROPLASTY Right 09/14/2015   Procedure: TOTAL KNEE ARTHROPLASTY;  Surgeon: Dannielle Huh, MD;  [facility];  Service: Orthopedics;  Laterality: Right;    There were no vitals filed for this visit.   Subjective Assessment - 09/10/20 1149    Subjective Pt reports no issues with exercises -- she's been doing them. Pt saw her doctor and x-rayed her hand; pt found to have small fracture in her L 4th phalange and is currently in finger splint.    Pertinent History PMH: OA, HTN, CAD, anemia, BTKR    Patient  Stated Goals Pt wants to be able to walk without walker    Currently in Pain? No/denies    Pain Onset 1 to 4 weeks ago                             [facility] Adult PT Treatment/Exercise - 09/10/20 0001      Ambulation/Gait   Pre-Gait Activities Backwards walking 2x10'    Gait Comments Interval training of 30 sec of "fast" intermittently x 3 loops around rehab      Knee/Hip Exercises: Standing   SLS --   Step over yard stick x10 bilat   SLS with Vectors forward tap on step 2x10 bilat    Other Standing Knee Exercises One foot on 4" step balance 2x30 sec    Other Standing Knee Exercises Romberg feet together eyes open head nods & shake x10 each; closed with head nods & head shake x10 each; tandem stance 2x30 sec bilat      Knee/Hip Exercises: Seated   Sit to Sand 10 reps;without UE support                    PT Short Term Goals - 07/08/20 1218      PT SHORT TERM  GOAL #1   Title Pt will be independent with HEP for strengthening and balance to continue gains on own.    Time 3    Period Weeks    Status New    Target Date 07/29/20      PT SHORT TERM GOAL #2   Title Pt will decrease TUG from 65 sec to <40 sec for improved balance and functional mobility.    Baseline 07/08/20 65 sec with RW    Time 3    Period Weeks    Status New    Target Date 07/29/20      PT SHORT TERM GOAL #3   Title Pt will ambulate 200' with cane supervision on level surfaces for improved household mobility.    Time 3    Period Weeks    Status New    Target Date 07/29/20             PT Long Term Goals - 09/07/20 1123      PT LONG TERM GOAL #1   Title Pt will increase gait speed from 0.65m/s to >0.66m/s for improved gait safety in home and short community distances.    Baseline 07/08/20 0.28 m/s; 09/07/20 0.17 m/s using cane    Time 6    Period Weeks    Status On-going    Target Date 10/19/20      PT LONG TERM GOAL #2   Title Pt will ambulate >400' on varied surfaces with  cane for improved short community distances mod I.    Baseline 200' on level ground    Time 6    Period Weeks    Status On-going    Target Date 10/19/20      PT LONG TERM GOAL #3   Title Pt will report <5/10 pain in left knee with activities including knee flexion for improved function.    Baseline 07/08/20 8/10; no pain for the most part 09/07/20    Time 6    Period Weeks    Status Achieved      PT LONG TERM GOAL #4   Title 5x STS <13 sec    Baseline 30 sec    Period Weeks    Status New    Target Date 10/19/20                 Plan - 09/10/20 1151    Clinical Impression Statement Treatment session focused on gait and improving gait speed. Continued to work on improving balance and stepping pattern. Pt with greater difficulty weight shifting and maintaining weight shift on L LE.    Personal Factors and Comorbidities Comorbidity 3+    Comorbidities OA, HTN, CAD, anemia    Examination-Activity Limitations Locomotion Level;Stairs;Stand;Transfers    Examination-Participation Restrictions Psychologist, forensic;Yard Work    Stability/Clinical Decision Making Stable/Uncomplicated    Rehab Potential Good    PT Frequency 2x / week    PT Duration 6 weeks    PT Treatment/Interventions ADLs/Self Care Home Management;Moist Heat;Cryotherapy;DME Instruction;Gait training;Stair training;Functional mobility training;Therapeutic activities;Neuromuscular re-education;Balance training;Therapeutic exercise;Patient/family education;Manual techniques;Passive range of motion    PT Next Visit Plan CHECK FOTO!! continue exercise progression as tolerated for knee/hip strengthening, balance/proprioception, gait training with [facility]    PT Home Exercise Plan Access code: [email or web address]    Consulted and Agree with Plan of Care Patient           Patient will benefit from skilled therapeutic intervention in order to improve the following deficits and impairments:  Abnormal gait,Decreased  balance,Decreased  range of motion,Decreased strength,Decreased mobility,Decreased knowledge of use of DME,Pain  Visit Diagnosis: Acute pain of left knee  Difficulty in walking, not elsewhere classified  Muscle weakness (generalized)  Unsteadiness on feet     Problem List Patient Active Problem List   Diagnosis Date Noted  . Vertigo 07/06/2018  . B12 deficiency 01/18/2016  . Anemia 01/18/2016  . S/P total knee replacement 09/14/2015  . Carotid arterial disease ([facility]) 05/27/2009  . Dyslipidemia 06/27/2008  . Coronary atherosclerosis 11/06/2006  . Essential hypertension 03/07/2006  . Osteoarthritis 03/07/2006    [facility] [address] PT, DPT 09/10/2020, 1:02 PM  [facility] [address] [place], [place], [address] Phone: [phone]   Fax:  [phone]  Name: AMRUTHA AVERA MRN: [id number] Date of Birth: [birth year]

## 2020-09-14 ENCOUNTER — Other Ambulatory Visit: Payer: Self-pay

## 2020-09-14 ENCOUNTER — Ambulatory Visit: Payer: Medicare Other | Admitting: Physical Therapy

## 2020-09-14 DIAGNOSIS — M6281 Muscle weakness (generalized): Secondary | ICD-10-CM

## 2020-09-14 DIAGNOSIS — M25562 Pain in left knee: Secondary | ICD-10-CM | POA: Diagnosis not present

## 2020-09-14 DIAGNOSIS — R2681 Unsteadiness on feet: Secondary | ICD-10-CM

## 2020-09-14 DIAGNOSIS — R262 Difficulty in walking, not elsewhere classified: Secondary | ICD-10-CM | POA: Diagnosis not present

## 2020-09-14 NOTE — Therapy (Signed)
[facility] [address] [place], [place], [address] Phone: [phone]   Fax:  [phone]  Physical Therapy Treatment  Patient Details  Name: Felicia Cross MRN: [id number] Date of Birth: 12-Oct-1932 Referring Provider (PT): left knee strengthening   Encounter Date: 09/14/2020   PT End of Session - 09/14/20 1141    Visit Number 8    Number of Visits 13    Date for PT Re-Evaluation 09/02/20    Authorization Type UHC medicare so 10th visit progress    PT Start Time 1140    PT Stop Time 1215   Pt request to leave early to pick up her grandson   PT Time Calculation (min) 35 min    Equipment Utilized During Treatment Gait belt    Activity Tolerance Patient tolerated treatment well    Behavior During Therapy [facility] for tasks assessed/performed           Past Medical History:  Diagnosis Date  . Anemia 01/18/2016   -reports on and off her whole life, on oral iron in the past remotely per report  . Arthritis   . B12 deficiency 01/18/2016  . CAD (coronary artery disease)   . CVD (cerebrovascular disease)   . Diverticulosis of colon   . H. pylori infection 10/20/2015  . Hyperlipidemia   . Hypertension   . Osteoarthritis 03/07/2006   Qualifier: Diagnosis of  By: Cato Mulligan MD, Bruce      Past Surgical History:  Procedure Laterality Date  . ABDOMINAL HYSTERECTOMY  1984  . REPLACEMENT TOTAL KNEE  2007   left  . TOTAL HIP ARTHROPLASTY  2010   Dr. Turner Daniels, right  . TOTAL KNEE ARTHROPLASTY Right 09/14/2015   Procedure: TOTAL KNEE ARTHROPLASTY;  Surgeon: Dannielle Huh, MD;  [facility];  Service: Orthopedics;  Laterality: Right;    There were no vitals filed for this visit.       [facility] PT Assessment - 09/14/20 0001      6 minute walk test results    Aerobic Endurance Distance Walked 380    Endurance additional comments Using cane                         [facility] Adult PT Treatment/Exercise - 09/14/20 0001       Ambulation/Gait   Ambulation/Gait Assistance Details CGA    Assistive device Straight cane    Gait Pattern Step-through pattern    Ambulation Surface Level;Indoor    Gait velocity 0.32 m/s    Gait Comments Interval training of "fast" intermittently x 3 loops around rehab      Knee/Hip Exercises: Standing   Heel Raises Both;10 reps    Heel Raises Limitations Single leg x10 each leg    SLS with Vectors forward tap on step 2x10 bilat; Step over yard stick x10 bilat    Other Standing Knee Exercises Romberg feet together eyes closed with head nods & head shake x10 each; on foam eyes closed & eyes open 2x30 sec each; tandem stance 2x30 sec bilat      Knee/Hip Exercises: Seated   Sit to Sand 10 reps;without UE support                    PT Short Term Goals - 07/08/20 1218      PT SHORT TERM GOAL #1   Title Pt will be independent with HEP for strengthening and balance to continue gains on own.  Time 3    Period Weeks    Status New    Target Date 07/29/20      PT SHORT TERM GOAL #2   Title Pt will decrease TUG from 65 sec to <40 sec for improved balance and functional mobility.    Baseline 07/08/20 65 sec with RW    Time 3    Period Weeks    Status New    Target Date 07/29/20      PT SHORT TERM GOAL #3   Title Pt will ambulate 200' with cane supervision on level surfaces for improved household mobility.    Time 3    Period Weeks    Status New    Target Date 07/29/20             PT Long Term Goals - 09/07/20 1123      PT LONG TERM GOAL #1   Title Pt will increase gait speed from 0.55m/s to >0.59m/s for improved gait safety in home and short community distances.    Baseline 07/08/20 0.28 m/s; 09/07/20 0.17 m/s using cane    Time 6    Period Weeks    Status On-going    Target Date 10/19/20      PT LONG TERM GOAL #2   Title Pt will ambulate >400' on varied surfaces with cane for improved short community distances mod I.    Baseline 200' on level ground    Time 6     Period Weeks    Status On-going    Target Date 10/19/20      PT LONG TERM GOAL #3   Title Pt will report <5/10 pain in left knee with activities including knee flexion for improved function.    Baseline 07/08/20 8/10; no pain for the most part 09/07/20    Time 6    Period Weeks    Status Achieved      PT LONG TERM GOAL #4   Title 5x STS <13 sec    Baseline 30 sec    Period Weeks    Status New    Target Date 10/19/20                 Plan - 09/14/20 1223    Clinical Impression Statement Progressed pt's balance exercises to include compliant surface. Improved weight shifting within small BOS; requires tactile cues and guarding to weight shift with larger stances during backwards walking. Continued to gait train with SPC. Pt able to amb 380' within 6 minutes this session demonstrating increased gait speed.    Personal Factors and Comorbidities Comorbidity 3+    Comorbidities OA, HTN, CAD, anemia    Examination-Activity Limitations Locomotion Level;Stairs;Stand;Transfers    Examination-Participation Restrictions Psychologist, forensic;Yard Work    Stability/Clinical Decision Making Stable/Uncomplicated    Rehab Potential Good    PT Frequency 2x / week    PT Duration 6 weeks    PT Treatment/Interventions ADLs/Self Care Home Management;Moist Heat;Cryotherapy;DME Instruction;Gait training;Stair training;Functional mobility training;Therapeutic activities;Neuromuscular re-education;Balance training;Therapeutic exercise;Patient/family education;Manual techniques;Passive range of motion    PT Next Visit Plan Unable to find FOTO? continue exercise progression as tolerated for knee/hip strengthening, balance/proprioception, gait training with SPC. Please have her schedule additional visits into June 13.    PT Home Exercise Plan Access code: [email or web address]    Consulted and Agree with Plan of Care Patient           Patient will benefit from skilled therapeutic intervention in order to  improve the following  deficits and impairments:  Abnormal gait,Decreased balance,Decreased range of motion,Decreased strength,Decreased mobility,Decreased knowledge of use of DME,Pain  Visit Diagnosis: Acute pain of left knee  Difficulty in walking, not elsewhere classified  Muscle weakness (generalized)  Unsteadiness on feet     Problem List Patient Active Problem List   Diagnosis Date Noted  . Vertigo 07/06/2018  . B12 deficiency 01/18/2016  . Anemia 01/18/2016  . S/P total knee replacement 09/14/2015  . Carotid arterial disease ([facility]) 05/27/2009  . Dyslipidemia 06/27/2008  . Coronary atherosclerosis 11/06/2006  . Essential hypertension 03/07/2006  . Osteoarthritis 03/07/2006    [facility] [address] PT, DPT 09/14/2020, 12:27 PM  [facility] [address] [place], [place], [address] Phone: [phone]   Fax:  [phone]  Name: ANICKA STUCKERT MRN: [id number] Date of Birth: 1933-04-15

## 2020-09-15 ENCOUNTER — Encounter: Payer: Medicare Other | Admitting: Physical Therapy

## 2020-09-17 ENCOUNTER — Other Ambulatory Visit: Payer: Self-pay

## 2020-09-17 ENCOUNTER — Ambulatory Visit: Payer: Medicare Other | Admitting: Physical Therapy

## 2020-09-17 VITALS — BP 149/88

## 2020-09-17 DIAGNOSIS — R2681 Unsteadiness on feet: Secondary | ICD-10-CM

## 2020-09-17 DIAGNOSIS — R262 Difficulty in walking, not elsewhere classified: Secondary | ICD-10-CM | POA: Diagnosis not present

## 2020-09-17 DIAGNOSIS — M25562 Pain in left knee: Secondary | ICD-10-CM | POA: Diagnosis not present

## 2020-09-17 DIAGNOSIS — M6281 Muscle weakness (generalized): Secondary | ICD-10-CM | POA: Diagnosis not present

## 2020-09-17 NOTE — Therapy (Signed)
[facility] [address] [place], [place], [address] Phone: [phone]   Fax:  [phone]  Physical Therapy Treatment  Patient Details  Name: Felicia Cross MRN: [id number] Date of Birth: 1933/03/11 Referring Provider (PT): left knee strengthening   Encounter Date: 09/17/2020   PT End of Session - 09/17/20 1159    Visit Number 9    Number of Visits 13    Date for PT Re-Evaluation 10/19/20    Authorization Type [employer] medicare so 10th visit progress    Progress Note Due on Visit 10    PT Start Time 1150    PT Stop Time 1230    PT Time Calculation (min) 40 min    Equipment Utilized During Treatment Gait belt    Activity Tolerance Patient tolerated treatment well    Behavior During Therapy [facility] for tasks assessed/performed           Past Medical History:  Diagnosis Date  . Anemia 01/18/2016   -reports on and off her whole life, on oral iron in the past remotely per report  . Arthritis   . B12 deficiency 01/18/2016  . CAD (coronary artery disease)   . CVD (cerebrovascular disease)   . Diverticulosis of colon   . H. pylori infection 10/20/2015  . Hyperlipidemia   . Hypertension   . Osteoarthritis 03/07/2006   Qualifier: Diagnosis of  By: Cato Mulligan MD, Bruce      Past Surgical History:  Procedure Laterality Date  . ABDOMINAL HYSTERECTOMY  1984  . REPLACEMENT TOTAL KNEE  2007   left  . TOTAL HIP ARTHROPLASTY  2010   Dr. Turner Daniels, right  . TOTAL KNEE ARTHROPLASTY Right 09/14/2015   Procedure: TOTAL KNEE ARTHROPLASTY;  Surgeon: Dannielle Huh, MD;  [facility]: [facility];  Service: Orthopedics;  Laterality: Right;    Vitals:   09/17/20 1121 09/17/20 1152  BP: (!) 166/84 (!) 149/88     Subjective Assessment - 09/17/20 1120    Subjective Pt reports she's been having vertigo issues this past week and has not been able to do her exercises.    Pertinent History PMH: OA, HTN, CAD, anemia, BTKR    Patient Stated Goals Pt wants to be  able to walk without walker    Currently in Pain? No/denies    Pain Onset 1 to 4 weeks ago                             [facility] Adult PT Treatment/Exercise - 09/17/20 0001      Ambulation/Gait   Ambulation/Gait Assistance Details CGA    Ambulation Distance (Feet) 375 Feet    Assistive device Straight cane    Gait Pattern Step-through pattern    Ambulation Surface Level;Indoor    Gait velocity 0.32 m/s    Gait Comments Interval training of "fast" intermittently; limited near the end of 6 min due to pt c/o dizziness/blurred vision requiring 30 sec of rest      Knee/Hip Exercises: Standing   SLS with Vectors forward tap on pool noodle 2x10 bilat; Step over pool noodle x10 bilat    Other Standing Knee Exercises Backwards walking 2x10; forwarding walking no support 2x10; lateral stepping 2x10                  PT Education - 09/17/20 1200    Education Details Discussed taking her BP when she feels dizzy or blurred vision at home.  Person(s) Educated Patient    Methods Explanation;Demonstration;Verbal cues    Comprehension Verbalized understanding;Returned demonstration;Verbal cues required;Tactile cues required            PT Short Term Goals - 07/08/20 1218      PT SHORT TERM GOAL #1   Title Pt will be independent with HEP for strengthening and balance to continue gains on own.    Time 3    Period Weeks    Status New    Target Date 07/29/20      PT SHORT TERM GOAL #2   Title Pt will decrease TUG from 65 sec to <40 sec for improved balance and functional mobility.    Baseline 07/08/20 65 sec with RW    Time 3    Period Weeks    Status New    Target Date 07/29/20      PT SHORT TERM GOAL #3   Title Pt will ambulate 200' with cane supervision on level surfaces for improved household mobility.    Time 3    Period Weeks    Status New    Target Date 07/29/20             PT Long Term Goals - 09/07/20 1123      PT LONG TERM GOAL #1   Title Pt  will increase gait speed from 0.81m/s to >0.67m/s for improved gait safety in home and short community distances.    Baseline 07/08/20 0.28 m/s; 09/07/20 0.17 m/s using cane    Time 6    Period Weeks    Status On-going    Target Date 10/19/20      PT LONG TERM GOAL #2   Title Pt will ambulate >400' on varied surfaces with cane for improved short community distances mod I.    Baseline 200' on level ground    Time 6    Period Weeks    Status On-going    Target Date 10/19/20      PT LONG TERM GOAL #3   Title Pt will report <5/10 pain in left knee with activities including knee flexion for improved function.    Baseline 07/08/20 8/10; no pain for the most part 09/07/20    Time 6    Period Weeks    Status Achieved      PT LONG TERM GOAL #4   Title 5x STS <13 sec    Baseline 30 sec    Period Weeks    Status New    Target Date 10/19/20                 Plan - 09/17/20 1121    Clinical Impression Statement Reported dizziness/blurred vision while ambulating near the end of 6 minutes; BP found to be elevated more than normal. BP decreased after amb. Checked again after treatment and remained stable at 140s/80s. Session primarily focused on pt weight shifting and increasing weight shift to L for improved step length bilat. Pt with improving weight shift. Did not update HEP as pt has not had a chance to work on it at home. Educated pt on checking her BP if she feels dizziness this next week as she may need to see her doctor.    Personal Factors and Comorbidities Comorbidity 3+    Comorbidities OA, HTN, CAD, anemia    Examination-Activity Limitations Locomotion Level;Stairs;Stand;Transfers    Examination-Participation Restrictions Psychologist, forensic;Yard Work    Stability/Clinical Decision Making Stable/Uncomplicated    Rehab Potential Good  PT Frequency 2x / week    PT Duration 6 weeks    PT Treatment/Interventions ADLs/Self Care Home Management;Moist Heat;Cryotherapy;DME  Instruction;Gait training;Stair training;Functional mobility training;Therapeutic activities;Neuromuscular re-education;Balance training;Therapeutic exercise;Patient/family education;Manual techniques;Passive range of motion    PT Next Visit Plan Any continued dizziness/blurred vision? continue exercise progression as tolerated for knee/hip strengthening, balance/proprioception, gait training with SPC.    PT Home Exercise Plan Access code: [email or web address]    Consulted and Agree with Plan of Care Patient           Patient will benefit from skilled therapeutic intervention in order to improve the following deficits and impairments:  Abnormal gait,Decreased balance,Decreased range of motion,Decreased strength,Decreased mobility,Decreased knowledge of use of DME,Pain  Visit Diagnosis: Acute pain of left knee  Difficulty in walking, not elsewhere classified  Muscle weakness (generalized)  Unsteadiness on feet     Problem List Patient Active Problem List   Diagnosis Date Noted  . Vertigo 07/06/2018  . B12 deficiency 01/18/2016  . Anemia 01/18/2016  . S/P total knee replacement 09/14/2015  . Carotid arterial disease (HCC) 05/27/2009  . Dyslipidemia 06/27/2008  . Coronary atherosclerosis 11/06/2006  . Essential hypertension 03/07/2006  . Osteoarthritis 03/07/2006    [facility] [address] PT, DPT 09/17/2020, 12:01 PM  [facility] [address] [place], [place], [address] Phone: [phone]   Fax:  [phone]  Name: Felicia Cross MRN: [id number] Date of Birth: [birth year]

## 2020-09-25 ENCOUNTER — Other Ambulatory Visit: Payer: Self-pay | Admitting: Family Medicine

## 2020-09-25 ENCOUNTER — Other Ambulatory Visit: Payer: Self-pay | Admitting: Cardiology

## 2020-09-25 ENCOUNTER — Ambulatory Visit: Payer: Medicare Other | Admitting: Physical Therapy

## 2020-09-25 DIAGNOSIS — I10 Essential (primary) hypertension: Secondary | ICD-10-CM

## 2020-10-06 ENCOUNTER — Other Ambulatory Visit: Payer: Self-pay

## 2020-10-06 ENCOUNTER — Ambulatory Visit: Payer: Medicare Other | Admitting: Physical Therapy

## 2020-10-06 DIAGNOSIS — R262 Difficulty in walking, not elsewhere classified: Secondary | ICD-10-CM

## 2020-10-06 DIAGNOSIS — M6281 Muscle weakness (generalized): Secondary | ICD-10-CM

## 2020-10-06 DIAGNOSIS — R2681 Unsteadiness on feet: Secondary | ICD-10-CM | POA: Diagnosis not present

## 2020-10-06 DIAGNOSIS — M25562 Pain in left knee: Secondary | ICD-10-CM

## 2020-10-06 NOTE — Therapy (Addendum)
[place], [place], [address] Phone: [phone]   Fax:  [phone]  Physical Therapy Treatment and 10th Visit Progress Note   Patient Details  Name: Felicia Cross MRN: [id number] Date of Birth: [birth year] Referring Provider (PT): left knee strengthening   Progress Note Reporting Period 07/08/20 to 10/06/20  See note below for Objective Data and Assessment of Progress/Goals.     Encounter Date: 10/06/2020   PT End of Session - 10/06/20 1155    Visit Number 10    Number of Visits 13    Date for PT Re-Evaluation 10/19/20    Authorization Type U[employer] medicare so 10th visit progress    Progress Note Due on Visit 10    PT Start Time 1155    PT Stop Time 1235    PT Time Calculation (min) 40 min    Equipment Utilized During Treatment Gait belt    Activity Tolerance Patient tolerated treatment well    Behavior During Therapy WFL for tasks assessed/performed           Past Medical History:  Diagnosis Date  . Anemia 01/18/2016   -reports on and off her whole life, on oral iron in the past remotely per report  . Arthritis   . B12 deficiency 01/18/2016  . CAD (coronary artery disease)   . CVD (cerebrovascular disease)   . Diverticulosis of colon   . H. pylori infection 10/20/2015  . Hyperlipidemia   . Hypertension   . Osteoarthritis 03/07/2006   Qualifier: Diagnosis of  By: Leanne Chang MD, Bruce      Past Surgical History:  Procedure Laterality Date  . ABDOMINAL HYSTERECTOMY  1984  . REPLACEMENT TOTAL KNEE  2007   left  . TOTAL HIP ARTHROPLASTY  2010   Dr. Mayer Camel, right  . TOTAL KNEE ARTHROPLASTY Right 09/14/2015   Procedure: TOTAL KNEE ARTHROPLASTY;  Surgeon: Vickey Huger, MD;  [facility];  Service: Orthopedics;  Laterality: Right;    There were no vitals filed for this visit.       [facility] PT Assessment - 10/06/20 0001      Transfers   Five time sit to stand comments  27 sec       Ambulation/Gait   Ambulation/Gait Assistance Details SBA    Ambulation Distance (Feet) 425 Feet    Assistive device Straight cane    Ambulation Surface Level;Indoor      6 minute walk test results    Aerobic Endurance Distance Walked 300    Endurance additional comments Using cane      Timed Up and Go Test   Normal TUG (seconds) 27                         [facility] Adult PT Treatment/Exercise - 10/06/20 0001      Knee/Hip Exercises: Standing   Other Standing Knee Exercises Romberg on foam eyes closed & eyes open 2x30 sec each      Knee/Hip Exercises: Seated   Sit to Sand without UE support;15 reps      Knee/Hip Exercises: Sidelying   Clams red tband 2x10 each    Other Sidelying Knee/Hip Exercises red tband hip extension 2x10 with knees bent                    PT Short Term Goals - 10/06/20 1241      PT SHORT TERM GOAL #1   Title Pt will  be independent with HEP for strengthening and balance to continue gains on own.    Time 3    Period Weeks    Status Achieved    Target Date 07/29/20      PT SHORT TERM GOAL #2   Title Pt will decrease TUG from 65 sec to <40 sec for improved balance and functional mobility.    Baseline 07/08/20 65 sec with RW; 10/06/20 27 sec with SPC    Time 3    Period Weeks    Status Achieved    Target Date 07/29/20      PT SHORT TERM GOAL #3   Title Pt will ambulate 200' with cane supervision on level surfaces for improved household mobility.    Time 3    Period Weeks    Status Achieved    Target Date 07/29/20             PT Long Term Goals - 10/06/20 1240      PT LONG TERM GOAL #1   Title Pt will increase gait speed from 0.4ms to >0.616m for improved gait safety in home and short community distances.    Baseline 07/08/20 0.28 m/s; 10/06/20 0.25 m/s using cane    Time 6    Period Weeks    Status On-going      PT LONG TERM GOAL #2   Title Pt will ambulate >400' on varied surfaces with cane for improved short  community distances mod I.    Baseline 200' on level ground; 425' SPC    Time 6    Period Weeks    Status Achieved      PT LONG TERM GOAL #3   Title Pt will report <5/10 pain in left knee with activities including knee flexion for improved function.    Baseline 07/08/20 8/10; no pain for the most part 09/07/20    Time 6    Period Weeks    Status Achieved      PT LONG TERM GOAL #4   Title 5x STS <13 sec    Baseline 30 sec on 09/07/20; 27 sec on 10/06/20    Period Weeks    Status On-going                 Plan - 10/06/20 1244    Clinical Impression Statement No dizziness or issues this session. Treatment focused on assessing pt's progress for her 10th visit. Pt has met all STGs. LTGs remain ongoing. Knee pain has continued to be well managed. Continues to have very decreased gait speed despite improved TUG score. Pt with slight improvement in her 5x STS. Challanged with sidelying strengthening for her bilat hips. Pt would benefit from continued therapy to further decrease her fall risk.    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 continue exercise progression as tolerated for knee/hip strengthening, balance/proprioception, gait training with SPC.    PT Home Exercise Plan Access code:  [email or web address]    VPXTGGYIRnd Agree with Plan of  Care Patient           Patient will benefit from skilled therapeutic intervention in order to improve the following deficits and impairments:  Abnormal gait,Decreased balance,Decreased range of motion,Decreased strength,Decreased mobility,Decreased knowledge of use of DME,Pain  Visit Diagnosis: Acute pain of left knee  Difficulty in walking, not elsewhere classified  Muscle weakness (generalized)  Unsteadiness on feet     Problem List Patient Active Problem List   Diagnosis Date Noted  . Vertigo 07/06/2018  . B12 deficiency 01/18/2016  . Anemia 01/18/2016  . S/P total knee replacement 09/14/2015  . Carotid arterial disease ([place]) 05/27/2009  . Dyslipidemia 06/27/2008  . Coronary atherosclerosis 11/06/2006  . Essential hypertension 03/07/2006  . Osteoarthritis 03/07/2006    [facility] [address] PT, DPT 10/06/2020, 12:47 PM  [facility] [address] [place], [place], [address] Phone: [phone]   Fax:  [phone]  Name: Felicia Cross MRN: [id number] Date of Birth: 1932/08/13

## 2020-10-07 DIAGNOSIS — M19029 Primary osteoarthritis, unspecified elbow: Secondary | ICD-10-CM | POA: Diagnosis not present

## 2020-10-07 DIAGNOSIS — S63639A Sprain of interphalangeal joint of unspecified finger, initial encounter: Secondary | ICD-10-CM | POA: Diagnosis not present

## 2020-10-09 ENCOUNTER — Other Ambulatory Visit: Payer: Self-pay

## 2020-10-09 ENCOUNTER — Ambulatory Visit: Payer: Medicare Other | Attending: Physician Assistant | Admitting: Physical Therapy

## 2020-10-09 DIAGNOSIS — R262 Difficulty in walking, not elsewhere classified: Secondary | ICD-10-CM

## 2020-10-09 DIAGNOSIS — R2681 Unsteadiness on feet: Secondary | ICD-10-CM | POA: Diagnosis present

## 2020-10-09 DIAGNOSIS — M6281 Muscle weakness (generalized): Secondary | ICD-10-CM | POA: Insufficient documentation

## 2020-10-09 DIAGNOSIS — M25562 Pain in left knee: Secondary | ICD-10-CM | POA: Insufficient documentation

## 2020-10-09 NOTE — Therapy (Signed)
[facility] [address] [place], [place], [address] Phone: [phone]   Fax:  [phone]  Physical Therapy Treatment  Patient Details  Name: Felicia Cross MRN: [id number] Date of Birth: 09/02/1932 Referring Provider (PT): left knee strengthening   Encounter Date: 10/09/2020   PT End of Session - 10/09/20 0934    Visit Number 11    Number of Visits 13    Date for PT Re-Evaluation 10/19/20    Authorization Type U[employer] medicare so 10th visit progress    Progress Note Due on Visit 10    PT Start Time 0935    PT Stop Time 1015    PT Time Calculation (min) 40 min    Equipment Utilized During Treatment Gait belt    Activity Tolerance Patient tolerated treatment well    Behavior During Therapy [facility] for tasks assessed/performed           Past Medical History:  Diagnosis Date  . Anemia 01/18/2016   -reports on and off her whole life, on oral iron in the past remotely per report  . Arthritis   . B12 deficiency 01/18/2016  . CAD (coronary artery disease)   . CVD (cerebrovascular disease)   . Diverticulosis of colon   . H. pylori infection 10/20/2015  . Hyperlipidemia   . Hypertension   . Osteoarthritis 03/07/2006   Qualifier: Diagnosis of  By: Cato Mulligan MD, Bruce      Past Surgical History:  Procedure Laterality Date  . ABDOMINAL HYSTERECTOMY  1984  . REPLACEMENT TOTAL KNEE  2007   left  . TOTAL HIP ARTHROPLASTY  2010   Dr. Turner Daniels, right  . TOTAL KNEE ARTHROPLASTY Right 09/14/2015   Procedure: TOTAL KNEE ARTHROPLASTY;  Surgeon: Dannielle Huh, MD;  Location: [facility];  Service: Orthopedics;  Laterality: Right;    There were no vitals filed for this visit.   Subjective Assessment - 10/09/20 0950    Subjective Pt reports she saw her doctor for her shoulder, finger and wrist. He wants to place an order for her. She is now out of her finger splint. Nothing else new or different. Pt reports compliance to HEP.    Pertinent History PMH:  OA, HTN, CAD, anemia, BTKR    Patient Stated Goals Pt wants to be able to walk without walker    Currently in Pain? No/denies    Pain Onset 1 to 4 weeks ago                             [facility] Adult PT Treatment/Exercise - 10/09/20 0001      Ambulation/Gait   Ambulation Distance (Feet) 338 Feet    Gait Comments 6 min walking      Knee/Hip Exercises: Standing   Heel Raises Both;20 reps    Hip Abduction Stengthening;Both;2 sets;10 reps;Knee straight    Abduction Limitations red tband around knee    Hip Extension Stengthening;Both;2 sets;10 reps;Knee straight    Extension Limitations red tband around knees    SLS with Vectors forward tap on pool noodle 2x10 bilat; Step over pool noodle x10 bilat    Other Standing Knee Exercises squat touch to chair 2x10    Other Standing Knee Exercises Romberg on foam eyes closed & eyes open 2x30 sec each      Knee/Hip Exercises: Seated   Sit to Sand without UE support;15 reps  PT Education - 10/09/20 1018    Education Details Discussed potential OT evaluation and treatment for her shoulder, wrist, and hand if her MD would like for her to work on those.    Person(s) Educated Patient    Methods Explanation;Demonstration;Verbal cues    Comprehension Verbalized understanding;Returned demonstration;Verbal cues required            PT Short Term Goals - 10/06/20 1241      PT SHORT TERM GOAL #1   Title Pt will be independent with HEP for strengthening and balance to continue gains on own.    Time 3    Period Weeks    Status Achieved    Target Date 07/29/20      PT SHORT TERM GOAL #2   Title Pt will decrease TUG from 65 sec to <40 sec for improved balance and functional mobility.    Baseline 07/08/20 65 sec with RW; 10/06/20 27 sec with SPC    Time 3    Period Weeks    Status Achieved    Target Date 07/29/20      PT SHORT TERM GOAL #3   Title Pt will ambulate 200' with cane supervision on level  surfaces for improved household mobility.    Time 3    Period Weeks    Status Achieved    Target Date 07/29/20             PT Long Term Goals - 10/06/20 1240      PT LONG TERM GOAL #1   Title Pt will increase gait speed from 0.95m/s to >0.24m/s for improved gait safety in home and short community distances.    Baseline 07/08/20 0.28 m/s; 10/06/20 0.25 m/s using cane    Time 6    Period Weeks    Status On-going      PT LONG TERM GOAL #2   Title Pt will ambulate >400' on varied surfaces with cane for improved short community distances mod I.    Baseline 200' on level ground; 425' SPC    Time 6    Period Weeks    Status Achieved      PT LONG TERM GOAL #3   Title Pt will report <5/10 pain in left knee with activities including knee flexion for improved function.    Baseline 07/08/20 8/10; no pain for the most part 09/07/20    Time 6    Period Weeks    Status Achieved      PT LONG TERM GOAL #4   Title 5x STS <13 sec    Baseline 30 sec on 09/07/20; 27 sec on 10/06/20    Period Weeks    Status On-going                 Plan - 10/09/20 1008    Clinical Impression Statement Treatment focused on continuing to progress her LE stability and strength. Pt with improving single leg weight shift and balance. Progressed pt's hip exercises into standing. Pt tolerated treatment well. Decreased amb distance this session.    Personal Factors and Comorbidities Comorbidity 3+    Comorbidities OA, HTN, CAD, anemia    Examination-Activity Limitations Locomotion Level;Stairs;Stand;Transfers    Examination-Participation Restrictions Psychologist, forensic;Yard Work    Stability/Clinical Decision Making Stable/Uncomplicated    Rehab Potential Good    PT Frequency 2x / week    PT Duration 6 weeks    PT Treatment/Interventions ADLs/Self Care Home Management;Moist Heat;Cryotherapy;DME Instruction;Gait training;Stair training;Functional mobility training;Therapeutic activities;Neuromuscular  re-education;Balance training;Therapeutic exercise;Patient/family education;Manual techniques;Passive range of motion    PT Next Visit Plan Continue exercise progression as tolerated for knee/hip strengthening, balance/proprioception, gait training with SPC.    PT Home Exercise Plan Access code: [email or web address]    Consulted and Agree with Plan of Care Patient           Patient will benefit from skilled therapeutic intervention in order to improve the following deficits and impairments:  Abnormal gait,Decreased balance,Decreased range of motion,Decreased strength,Decreased mobility,Decreased knowledge of use of DME,Pain  Visit Diagnosis: Acute pain of left knee  Difficulty in walking, not elsewhere classified  Muscle weakness (generalized)  Unsteadiness on feet     Problem List Patient Active Problem List   Diagnosis Date Noted  . Vertigo 07/06/2018  . B12 deficiency 01/18/2016  . Anemia 01/18/2016  . S/P total knee replacement 09/14/2015  . Carotid arterial disease (HCC) 05/27/2009  . Dyslipidemia 06/27/2008  . Coronary atherosclerosis 11/06/2006  . Essential hypertension 03/07/2006  . Osteoarthritis 03/07/2006    [facility] [address] PT, DPT 10/09/2020, 10:19 AM  [facility] [address] [place], [place], [address] Phone: [phone]   Fax:  [phone]  Name: BRAELIN BROSCH MRN: [id number] Date of Birth: [birth year]

## 2020-10-12 ENCOUNTER — Ambulatory Visit: Payer: Medicare Other | Admitting: Physical Therapy

## 2020-10-14 ENCOUNTER — Ambulatory Visit: Payer: Medicare Other | Admitting: Physical Therapy

## 2020-10-14 ENCOUNTER — Other Ambulatory Visit: Payer: Self-pay

## 2020-10-14 VITALS — BP 167/100

## 2020-10-14 DIAGNOSIS — R2681 Unsteadiness on feet: Secondary | ICD-10-CM

## 2020-10-14 DIAGNOSIS — M25562 Pain in left knee: Secondary | ICD-10-CM

## 2020-10-14 DIAGNOSIS — M6281 Muscle weakness (generalized): Secondary | ICD-10-CM

## 2020-10-14 DIAGNOSIS — R262 Difficulty in walking, not elsewhere classified: Secondary | ICD-10-CM | POA: Diagnosis not present

## 2020-10-14 NOTE — Therapy (Signed)
[facility] [address] [place], [place], [address] Phone: [phone]   Fax:  [phone]  Physical Therapy Treatment  Patient Details  Name: SHEMEIKA STARZYK MRN: [id number] Date of Birth: Oct 31, 1932 Referring Provider (PT): left knee strengthening   Encounter Date: 10/14/2020   PT End of Session - 10/14/20 1259    Visit Number 12    Number of Visits 13    Date for PT Re-Evaluation 10/19/20    Authorization Type [employer] medicare so 10th visit progress    Progress Note Due on Visit 10    PT Start Time 1105    PT Stop Time 1145    PT Time Calculation (min) 40 min    Equipment Utilized During Treatment Gait belt    Activity Tolerance Patient tolerated treatment well    Behavior During Therapy [facility] for tasks assessed/performed           Past Medical History:  Diagnosis Date  . Anemia 01/18/2016   -reports on and off her whole life, on oral iron in the past remotely per report  . Arthritis   . B12 deficiency 01/18/2016  . CAD (coronary artery disease)   . CVD (cerebrovascular disease)   . Diverticulosis of colon   . H. pylori infection 10/20/2015  . Hyperlipidemia   . Hypertension   . Osteoarthritis 03/07/2006   Qualifier: Diagnosis of  By: Cato Mulligan MD, Bruce      Past Surgical History:  Procedure Laterality Date  . ABDOMINAL HYSTERECTOMY  1984  . REPLACEMENT TOTAL KNEE  2007   left  . TOTAL HIP ARTHROPLASTY  2010   Dr. Turner Daniels, right  . TOTAL KNEE ARTHROPLASTY Right 09/14/2015   Procedure: TOTAL KNEE ARTHROPLASTY;  Surgeon: Dannielle Huh, MD;  Location: [facility] OR;  Service: Orthopedics;  Laterality: Right;    Vitals:   10/14/20 1114  BP: (!) 167/100     Subjective Assessment - 10/14/20 1115    Subjective Pt reports she's been getting a lot of vertigo leading to her last canceled appointment. She states she is still feeling it today. She reports her BP has been okay.    Pertinent History PMH: OA, HTN, CAD, anemia, BTKR     Patient Stated Goals Pt wants to be able to walk without walker    Currently in Pain? No/denies    Pain Onset 1 to 4 weeks ago                   Vestibular Assessment - 10/14/20 0001      Symptom Behavior   Duration of Dizziness Half an hour    Symptom Nature Spontaneous    Aggravating Factors No known aggravating factors   "Not doing anything"   Relieving Factors Rest      Oculomotor Exam   Oculomotor Alignment Normal    Spontaneous Absent    Gaze-induced  Right beating nystagmus with L gaze;Left beating nystagmus with R gaze   Very mild; possibly age related   Smooth Pursuits Intact    Saccades Slow    Comment HIT: No increased symptoms or visible corrective saccades      Oculomotor Exam-Fixation Suppressed    Ocular Alignment WFL      Vestibulo-Ocular Reflex   VOR 1 Head Only (x 1 viewing) WNL with no dizziness reported      Positional Testing   Dix-Hallpike Dix-Hallpike Right;Dix-Hallpike Left      Dix-Hallpike Right   Dix-Hallpike Right Duration 0  Dix-Hallpike Right Symptoms Other (comment)   Reported blurred vision which improved after ~30 sec     Dix-Hallpike Left   Dix-Hallpike Left Duration ~20 sec    Dix-Hallpike Left Symptoms No nystagmus   unable to visualize nystagmus but reports room spinning     Cognition   Cognition Orientation Level Appropriate for developmental age      Orthostatics   BP supine (x 5 minutes) 153/74   Sidelying on L: 131/63   BP sitting 154/88    BP standing (after 1 minute) 153/81                    [facility] Adult PT Treatment/Exercise - 10/14/20 0001      Knee/Hip Exercises: Standing   Hip Abduction Stengthening;Both;2 sets;10 reps;Knee straight    Abduction Limitations red tband around ankle    Hip Extension Stengthening;Both;2 sets;10 reps;Knee straight    Extension Limitations red tband around ankle    Other Standing Knee Exercises squat touch to chair 2x10 with 5#                  PT  Education - 10/14/20 1258    Education Details Discussed with pt about continuing to monitor her BP at home; especially after activity.    Person(s) Educated Patient    Methods Explanation;Demonstration;Verbal cues    Comprehension Verbalized understanding;Returned demonstration;Verbal cues required            PT Short Term Goals - 10/06/20 1241      PT SHORT TERM GOAL #1   Title Pt will be independent with HEP for strengthening and balance to continue gains on own.    Time 3    Period Weeks    Status Achieved    Target Date 07/29/20      PT SHORT TERM GOAL #2   Title Pt will decrease TUG from 65 sec to <40 sec for improved balance and functional mobility.    Baseline 07/08/20 65 sec with RW; 10/06/20 27 sec with SPC    Time 3    Period Weeks    Status Achieved    Target Date 07/29/20      PT SHORT TERM GOAL #3   Title Pt will ambulate 200' with cane supervision on level surfaces for improved household mobility.    Time 3    Period Weeks    Status Achieved    Target Date 07/29/20             PT Long Term Goals - 10/06/20 1240      PT LONG TERM GOAL #1   Title Pt will increase gait speed from 0.48m/s to >0.41m/s for improved gait safety in home and short community distances.    Baseline 07/08/20 0.28 m/s; 10/06/20 0.25 m/s using cane    Time 6    Period Weeks    Status On-going      PT LONG TERM GOAL #2   Title Pt will ambulate >400' on varied surfaces with cane for improved short community distances mod I.    Baseline 200' on level ground; 425' SPC    Time 6    Period Weeks    Status Achieved      PT LONG TERM GOAL #3   Title Pt will report <5/10 pain in left knee with activities including knee flexion for improved function.    Baseline 07/08/20 8/10; no pain for the most part 09/07/20    Time 6  Period Weeks    Status Achieved      PT LONG TERM GOAL #4   Title 5x STS <13 sec    Baseline 30 sec on 09/07/20; 27 sec on 10/06/20    Period Weeks    Status On-going                  Plan - 10/14/20 1141    Clinical Impression Statement Pt with c/o dizziness/vertigo this session limiting amb for safety. BP monitored throughout -- mostly in 150s/80s but after strengthening increased to 160s/100. Decreased after rest. Performed brief vestibular assessment. Pt symptomatic with "room spinning" sensation when checking L ear; pt symptomatic with blurred vision when checking R ear (symptoms improved with rest). No nystagmus seen despite pt's symptoms. Attempted to progress pt's strengthening exercises as able; however, limited due to her BP.    Personal Factors and Comorbidities Comorbidity 3+    Comorbidities OA, HTN, CAD, anemia    Examination-Activity Limitations Locomotion Level;Stairs;Stand;Transfers    Examination-Participation Restrictions Psychologist, forensic;Yard Work    Stability/Clinical Decision Making Stable/Uncomplicated    Rehab Potential Good    PT Frequency 2x / week    PT Duration 6 weeks    PT Treatment/Interventions ADLs/Self Care Home Management;Moist Heat;Cryotherapy;DME Instruction;Gait training;Stair training;Functional mobility training;Therapeutic activities;Neuromuscular re-education;Balance training;Therapeutic exercise;Patient/family education;Manual techniques;Passive range of motion    PT Next Visit Plan Re-assessment for continued PT needs. Continue exercise progression as tolerated for knee/hip strengthening, balance/proprioception, gait training with SPC.    PT Home Exercise Plan Access code: [email or web address]    Consulted and Agree with Plan of Care Patient           Patient will benefit from skilled therapeutic intervention in order to improve the following deficits and impairments:  Abnormal gait,Decreased balance,Decreased range of motion,Decreased strength,Decreased mobility,Decreased knowledge of use of DME,Pain  Visit Diagnosis: Acute pain of left knee  Difficulty in walking, not elsewhere classified  Muscle  weakness (generalized)  Unsteadiness on feet     Problem List Patient Active Problem List   Diagnosis Date Noted  . Vertigo 07/06/2018  . B12 deficiency 01/18/2016  . Anemia 01/18/2016  . S/P total knee replacement 09/14/2015  . Carotid arterial disease (HCC) 05/27/2009  . Dyslipidemia 06/27/2008  . Coronary atherosclerosis 11/06/2006  . Essential hypertension 03/07/2006  . Osteoarthritis 03/07/2006    [facility] [address] PT, DPT 10/14/2020, 1:05 PM  [facility] [address] [place], [place], [address] Phone: [phone]   Fax:  [phone]  Name: REEM FLEURY MRN: [id number] Date of Birth: 03/07/1933

## 2020-10-16 ENCOUNTER — Telehealth: Payer: Self-pay | Admitting: Family Medicine

## 2020-10-16 NOTE — Telephone Encounter (Signed)
Left message for patient to call back and schedule Medicare Annual Wellness Visit (AWV) either virtually or in office.   Last AWV 10/21/19  please schedule at anytime with [facility] Nurse Health Advisor 1 or 2   This should be a 45 minute visit.  

## 2020-10-20 ENCOUNTER — Ambulatory Visit: Payer: Medicare Other

## 2020-10-20 ENCOUNTER — Telehealth: Payer: Self-pay

## 2020-10-20 NOTE — Telephone Encounter (Signed)
Attempted to call pt 3 times left a voice mail message for the 1115 MWV pt may reschedule for next available appointment Vernona Rieger [place] Tapp,LPN

## 2020-10-20 NOTE — Progress Notes (Deleted)
Subjective:   Felicia Cross is a 87 y.o. female who presents for an Initial Medicare Annual Wellness Visit. I connected with Felicia Cross today by telephone and verified that I am speaking with the correct person using two identifiers. Location patient: home Location provider: work Persons participating in the virtual visit: patient, provider.   I discussed the limitations, risks, security and privacy concerns of performing an evaluation and management service by telephone and the availability of in person appointments. I also discussed with the patient that there may be a patient responsible charge related to this service. The patient expressed understanding and verbally consented to this telephonic visit.    Interactive audio and video telecommunications were attempted between this provider and patient, however failed, due to patient having technical difficulties OR patient did not have access to video capability.  We continued and completed visit with audio only.    Review of Systems    N/a       Objective:    There were no vitals filed for this visit. There is no height or weight on file to calculate BMI.  Advanced Directives 08/20/2020 07/08/2020 06/21/2018 03/01/2018 12/08/2017 11/06/2017 06/03/2016  Does Patient Have a Medical Advance Directive? No Yes No Yes Yes Yes No  Type of Advance Directive - Hospital doctor of [place];Living will Healthcare Power of [place];Living will - -  Copy of Healthcare Power of Attorney in Chart? - - - No - copy requested No - copy requested - -  Would patient like information on creating a medical advance directive? - - No - Patient declined - - - -    Current Medications (verified) Outpatient Encounter Medications as of 10/20/2020  Medication Sig   aspirin EC 81 MG tablet Take 1 tablet (81 mg total) by mouth daily.   cloNIDine (CATAPRES) 0.1 MG tablet Take 0.1 mg by mouth 2 (two) times daily.   cyanocobalamin  500 MCG tablet Take 500 mcg by mouth daily.   dimenhyDRINATE (DRAMAMINE PO) Take by mouth. As needed for vertigo   gabapentin (NEURONTIN) 100 MG capsule Take 1 capsule by mouth at bedtime   hydrochlorothiazide (HYDRODIURIL) 25 MG tablet Take 1 tablet by mouth once daily   losartan (COZAAR) 100 MG tablet Take 1 tablet by mouth once daily   meclizine (ANTIVERT) 25 MG tablet meclizine 25 mg tablet  TAKE 1 TABLET BY MOUTH THREE TIMES A DAY AS NEEDED DAILY   metoprolol tartrate (LOPRESSOR) 25 MG tablet Take 1 tablet (25 mg total) by mouth 2 (two) times daily.   Multiple Vitamin (MULTIVITAMIN WITH MINERALS) TABS Take 1 tablet by mouth daily.    simvastatin (ZOCOR) 40 MG tablet Take 1 tablet by mouth once daily   No facility-administered encounter medications on file as of 10/20/2020.    Allergies (verified) Codeine and Hydrocodone   History: Past Medical History:  Diagnosis Date   Anemia 01/18/2016   -reports on and off her whole life, on oral iron in the past remotely per report   Arthritis    B12 deficiency 01/18/2016   CAD (coronary artery disease)    CVD (cerebrovascular disease)    Diverticulosis of colon    H. pylori infection 10/20/2015   Hyperlipidemia    Hypertension    Osteoarthritis 03/07/2006   Qualifier: Diagnosis of  By: Cato Mulligan MD, Bruce     Past Surgical History:  Procedure Laterality Date   ABDOMINAL HYSTERECTOMY  1984   REPLACEMENT TOTAL KNEE  2007  left   TOTAL HIP ARTHROPLASTY  2010   Dr. Turner Daniels, right   TOTAL KNEE ARTHROPLASTY Right 09/14/2015   Procedure: TOTAL KNEE ARTHROPLASTY;  Surgeon: Dannielle Huh, MD;  Location: [facility] OR;  Service: Orthopedics;  Laterality: Right;   Family History  Problem Relation Age of Onset   Diabetes Mother    Hypertension Father    Kidney disease Father    Heart attack Sister    Heart attack Brother    Colon cancer Neg Hx    Stomach cancer Neg Hx    Esophageal cancer Neg Hx    Rectal cancer Neg Hx    Social History    Socioeconomic History   Marital status: Married    Spouse name: Not on file   Number of children: Not on file   Years of education: Not on file   Highest education level: Not on file  Occupational History   Not on file  Tobacco Use   Smoking status: Former    Packs/day: 1.00    Years: 30.00    Pack years: 30.00    Types: Cigarettes    Quit date: 11/01/1980    Years since quitting: 39.9   Smokeless tobacco: Never  Substance and Sexual Activity   Alcohol use: Yes    Alcohol/week: 0.0 standard drinks    Comment: glass of wine   Drug use: No   Sexual activity: Not on file  Other Topics Concern   Not on file  Social History Narrative   Updated 01/07/15   Work or School: retired from Geneticist, molecular, 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 [place]      Home 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, Information systems manager and two great grandchildren age 86 and 70      Spiritual Beliefs: Christian      Lifestyle: regular exercise at the [employer]; healthy diet      Social Determinants of Health   Financial Resource Strain: Not on file  Food Insecurity: Not on file  Transportation Needs: Not on file  Physical Activity: Not on file  Stress: Not on file  Social Connections: Not on file    Tobacco Counseling Counseling given: Not Answered   Clinical Intake:                 Diabetic?no         Activities of Daily Living No flowsheet data found.  Patient Care Team: Deeann Saint, MD as PCP - General (Family Medicine) Kirkland Hun, MD as Consulting Physician (Obstetrics and Gynecology) Lewayne Bunting, MD as Consulting Physician (Cardiology) Dannielle Huh, MD as Consulting Physician (Orthopedic Surgery)  Indicate any recent Medical Services you may have received from other than [facility] providers in the past year (date may be approximate).     Assessment:   This is a routine wellness examination for [place].  Hearing/Vision screen No results found.  Dietary issues and exercise activities  discussed:     Goals Addressed   None    Depression Screen PHQ 2/9 Scores 10/21/2019 11/06/2017 06/03/2016 04/18/2016 04/10/2015 04/30/2014  PHQ - 2 Score 0 0 0 0 0 0    Fall Risk Fall Risk  10/21/2019 04/10/2018 11/06/2017 06/03/2016 04/18/2016  Falls in the past year? 1 0 No Yes No  Comment - - - fell in bathroom one month ago  -  Number falls in past yr: 1 - - - -  Injury with Fall? 1 - - - -    FALL RISK PREVENTION PERTAINING TO THE HOME:  Any stairs in or around the home? {YES/NO:21197} If so, are there any without handrails? {YES/NO:21197} Home free of loose throw rugs in walkways, pet beds, electrical cords, etc? {YES/NO:21197} Adequate lighting in your home to reduce risk of falls? {YES/NO:21197}  ASSISTIVE DEVICES UTILIZED TO PREVENT FALLS:  Life alert? {YES/NO:21197} Use of a cane, walker or w/c? {YES/NO:21197} Grab bars in the bathroom? {YES/NO:21197} Shower chair or bench in shower? {YES/NO:21197} Elevated toilet seat or a handicapped toilet? {YES/NO:21197}  TIMED UP AND GO:  Was the test performed? {YES/NO:21197}.  Length of time to ambulate 10 feet:  sec.   {Appearance of Gait:2101803}  Cognitive Function: MMSE - Mini Mental State Exam 11/06/2017 06/03/2016  Not completed: (No Data) (No Data)        Immunizations Immunization History  Administered Date(s) Administered   Influenza Whole 02/07/2011   Influenza, High Dose Seasonal PF 01/07/2015, 01/23/2015, 01/18/2016, 04/10/2017, 01/25/2018, 02/11/2019   PFIZER(Purple Top)SARS-COV-2 Vaccination 06/17/2019, 07/11/2019   Pneumococcal Conjugate-13 04/30/2014   Pneumococcal Polysaccharide-23 05/09/2002, 02/10/2019   Td 05/09/2004, 05/09/2008, 05/24/2018   Zoster, Live 04/10/2015    {TDAP status:2101805}  {Flu Vaccine status:2101806}  {Pneumococcal vaccine status:2101807}  {Covid-19 vaccine status:2101808}  Qualifies for Shingles Vaccine? {YES/NO:21197}  Zostavax completed {YES/NO:21197}  {Shingrix  Completed?:2101804}  Screening Tests Health Maintenance  Topic Date Due   Zoster Vaccines- Shingrix (1 of 2) Never done   COVID-19 Vaccine (3 - Pfizer risk series) 08/08/2019   INFLUENZA VACCINE  12/07/2020   TETANUS/TDAP  05/24/2028   PNA vac Low Risk Adult  Completed   DEXA SCAN  Addressed   HPV VACCINES  Aged Out    Health Maintenance  Health Maintenance Due  Topic Date Due   Zoster Vaccines- Shingrix (1 of 2) Never done   COVID-19 Vaccine (3 - Pfizer risk series) 08/08/2019    {Colorectal cancer screening:2101809}  {Mammogram status:21018020}  {Bone Density status:21018021}  Lung Cancer Screening: (Low Dose CT Chest recommended if Age 67-80 years, 30 pack-year currently smoking OR have quit w/in 15years.) {DOES NOT does:27190::"does not"} qualify.   Lung Cancer Screening Referral: ***  Additional Screening:  Hepatitis C Screening: {DOES NOT does:27190::"does not"} qualify; Completed ***  Vision Screening: Recommended annual ophthalmology exams for early detection of glaucoma and other disorders of the eye. Is the patient up to date with their annual eye exam?  {YES/NO:21197} Who is the provider or what is the name of the office in which the patient attends annual eye exams? *** If pt is not established with a provider, would they like to be referred to a provider to establish care? {YES/NO:21197}.   Dental Screening: Recommended annual dental exams for proper oral hygiene  Community Resource Referral / Chronic Care Management: CRR required this visit?  {YES/NO:21197}  CCM required this visit?  {YES/NO:21197}     Plan:     I have personally reviewed and noted the following in the patient's chart:   Medical and social history Use of alcohol, tobacco or illicit drugs  Current medications and supplements including opioid prescriptions. {Opioid Prescriptions:[phone]} Functional ability and status Nutritional status Physical activity Advanced  directives List of other physicians Hospitalizations, surgeries, and ER visits in previous 12 months Vitals Screenings to include cognitive, depression, and falls Referrals and appointments  In addition, I have reviewed and discussed with patient certain preventive protocols, quality metrics, and best practice recommendations. A written personalized care plan for preventive services as well as general preventive health recommendations were provided to patient.  March RummageLaura Kirt Chew, LPN   1/61/09606/14/2022   Nurse Notes: ***

## 2020-10-26 DIAGNOSIS — M19029 Primary osteoarthritis, unspecified elbow: Secondary | ICD-10-CM | POA: Diagnosis not present

## 2020-11-04 ENCOUNTER — Ambulatory Visit: Payer: Medicare Other | Admitting: Physical Therapy

## 2020-11-05 ENCOUNTER — Ambulatory Visit: Payer: Medicare Other

## 2020-11-06 ENCOUNTER — Ambulatory Visit: Payer: Medicare Other | Attending: Physician Assistant | Admitting: Physical Therapy

## 2020-11-06 ENCOUNTER — Other Ambulatory Visit: Payer: Self-pay

## 2020-11-06 DIAGNOSIS — M6281 Muscle weakness (generalized): Secondary | ICD-10-CM

## 2020-11-06 DIAGNOSIS — R262 Difficulty in walking, not elsewhere classified: Secondary | ICD-10-CM | POA: Diagnosis not present

## 2020-11-06 DIAGNOSIS — M25562 Pain in left knee: Secondary | ICD-10-CM

## 2020-11-06 DIAGNOSIS — R2681 Unsteadiness on feet: Secondary | ICD-10-CM

## 2020-11-06 NOTE — Therapy (Addendum)
[place], [place], [address] Phone: [phone]   Fax:  [phone]  Physical Therapy Treatment and Discharge  Patient Details  Name: Felicia Cross MRN: [id number] Date of Birth: 09-Mar-1933 Referring Provider (PT): left knee strengthening PHYSICAL THERAPY DISCHARGE SUMMARY  Visits from Start of Care: 07/08/20  Current functional level related to goals / functional outcomes: See below   Remaining deficits: See below   Education / Equipment: HRP   Patient agrees to discharge. Patient goals were partially met. Patient is being discharged due to not returning since the last visit.   Encounter Date: 11/06/2020   PT End of Session - 11/06/20 1206     Visit Number 13    Number of Visits 19    Date for PT Re-Evaluation 12/18/20    Authorization Type [employer] medicare so 10th visit progress    Progress Note Due on Visit 10    PT Start Time 1105    PT Stop Time 1150    PT Time Calculation (min) 45 min    Equipment Utilized During Treatment Gait belt    Activity Tolerance Patient tolerated treatment well    Behavior During Therapy WFL for tasks assessed/performed             Past Medical History:  Diagnosis Date   Anemia 01/18/2016   -reports on and off her whole life, on oral iron in the past remotely per report   Arthritis    B12 deficiency 01/18/2016   CAD (coronary artery disease)    CVD (cerebrovascular disease)    Diverticulosis of colon    H. pylori infection 10/20/2015   Hyperlipidemia    Hypertension    Osteoarthritis 03/07/2006   Qualifier: Diagnosis of  By: Leanne Chang MD, Bruce      Past Surgical History:  Procedure Laterality Date   ABDOMINAL HYSTERECTOMY  1984   REPLACEMENT TOTAL KNEE  2007   left   TOTAL HIP ARTHROPLASTY  2010   Dr. Mayer Camel, right   TOTAL KNEE ARTHROPLASTY Right 09/14/2015   Procedure: TOTAL KNEE ARTHROPLASTY;  Surgeon: Vickey Huger, MD;  Location: [facility];  Service:  Orthopedics;  Laterality: Right;    There were no vitals filed for this visit.   Subjective Assessment - 11/06/20 1108     Subjective 3 falls noted since last treatment session: One on Sunday, yesterday and reports she also fell in her bathroom ~1 wk ago. Pt states her foot caught her other foot leading to all of the falls. Pt had taken hiatus on her PT to rehab her hand at the hand clinic. Pt states no issues with vertigo in the last 3 weeks. Pt admits that she has not been consistent with her exercises.    Pertinent History PMH: OA, HTN, CAD, anemia, BTKR    Patient Stated Goals Pt wants to be able to walk without walker    Currently in Pain? No/denies    Pain Onset 1 to 4 weeks ago                [facility] PT Assessment - 11/06/20 0001       Strength   Right Hip Flexion 3+/5    Right Hip Extension 3-/5    Right Hip ABduction 3+/5    Left Hip Flexion 3+/5    Left Hip Extension 3-/5    Left Hip ABduction 3+/5    Right Knee Flexion 5/5    Right Knee Extension 5/5  Left Knee Flexion 5/5    Left Knee Extension 5/5    Right Ankle Dorsiflexion 4/5    Right Ankle Plantar Flexion 4/5    Right Ankle Inversion 4/5    Right Ankle Eversion 4/5    Left Ankle Dorsiflexion 4/5    Left Ankle Plantar Flexion 4/5    Left Ankle Inversion 4/5    Left Ankle Eversion 4/5      Transfers   Five time sit to stand comments  30 sec      Ambulation/Gait   Ambulation Distance (Feet) 150 Feet    Assistive device Straight cane    Gait Pattern Step-through pattern   very slow   Gait velocity 0.25 m/s      Timed Up and Go Test   Normal TUG (seconds) 27                                   PT Education - 11/06/20 1157     Education Details Discussed exam findings and how pt continues to demo decreased hip strength. Increased time spent discussing pt's consistency with her HEP.    Person(s) Educated Patient    Methods Explanation;Demonstration    Comprehension  Verbalized understanding;Returned demonstration;Verbal cues required;Tactile cues required              PT Short Term Goals - 10/06/20 1241       PT SHORT TERM GOAL #1   Title Pt will be independent with HEP for strengthening and balance to continue gains on own.    Time 3    Period Weeks    Status Achieved    Target Date 07/29/20      PT SHORT TERM GOAL #2   Title Pt will decrease TUG from 65 sec to <40 sec for improved balance and functional mobility.    Baseline 07/08/20 65 sec with RW; 10/06/20 27 sec with SPC    Time 3    Period Weeks    Status Achieved    Target Date 07/29/20      PT SHORT TERM GOAL #3   Title Pt will ambulate 200' with cane supervision on level surfaces for improved household mobility.    Time 3    Period Weeks    Status Achieved    Target Date 07/29/20               PT Long Term Goals - 11/06/20 1205       PT LONG TERM GOAL #1   Title Pt will increase gait speed from 0.9ms to >0.639m for improved gait safety in home and short community distances.    Baseline 07/08/20 0.28 m/s; 10/06/20 0.25 m/s using cane    Time 6    Period Weeks    Status On-going    Target Date 12/18/20      PT LONG TERM GOAL #2   Title Pt will ambulate >400' on varied surfaces with cane for improved short community distances mod I.    Baseline 200' on level ground; 425' SPC    Time 6    Period Weeks    Status Achieved      PT LONG TERM GOAL #3   Title Pt will report <5/10 pain in left knee with activities including knee flexion for improved function.    Baseline 07/08/20 8/10; no pain for the most part 09/07/20    Time 6  Period Weeks    Status Achieved      PT LONG TERM GOAL #4   Title 5x STS <13 sec    Baseline 30 sec on 09/07/20; 27 sec on 10/06/20    Period Weeks    Status On-going    Target Date 12/18/20                   Plan - 11/06/20 1158     Clinical Impression Statement Pt with 3 falls since the last time she was seen 3 weeks ago. Pt  reassessed -- no huge gains; pt's 5x STS time is still at 30 sec as well as her TUG score placing her at high risk of fall. Pt has not met any of her LTGs; however, pt admits she has not been consistent with her exercises. Time spent discussing her care with pt and her son; pt states she will try to be more consistent (especially since she would like to remain at 1x/wk because of her copay). Reassessed strength and pt's hips remain the weakest. Pt would benefit from continued PT to address her instability and decrease her risk of falls.    Personal Factors and Comorbidities Comorbidity 3+    Comorbidities OA, HTN, CAD, anemia    Examination-Activity Limitations Locomotion Level;Stairs;Stand;Transfers    Examination-Participation Restrictions Art gallery manager;Yard Work    Stability/Clinical Decision Making Stable/Uncomplicated    Rehab Potential Good    PT Frequency 2x / week    PT Duration 6 weeks    PT Treatment/Interventions ADLs/Self Care Home Management;Moist Heat;Cryotherapy;DME Instruction;Gait training;Stair training;Functional mobility training;Therapeutic activities;Neuromuscular re-education;Balance training;Therapeutic exercise;Patient/family education;Manual techniques;Passive range of motion    PT Next Visit Plan Continue exercise progression as tolerated for knee/hip strengthening, balance/proprioception, gait training with SPC.    PT Home Exercise Plan Access code: [email or web address]    Consulted and Agree with Plan of Care Patient             Patient will benefit from skilled therapeutic intervention in order to improve the following deficits and impairments:  Abnormal gait, Decreased balance, Decreased range of motion, Decreased strength, Decreased mobility, Decreased knowledge of use of DME, Pain  Visit Diagnosis: Acute pain of left knee  Difficulty in walking, not elsewhere classified  Muscle weakness (generalized)  Unsteadiness on feet     Problem List Patient  Active Problem List   Diagnosis Date Noted   Vertigo 07/06/2018   B12 deficiency 01/18/2016   Anemia 01/18/2016   S/P total knee replacement 09/14/2015   Carotid arterial disease ([place]) 05/27/2009   Dyslipidemia 06/27/2008   Coronary atherosclerosis 11/06/2006   Essential hypertension 03/07/2006   Osteoarthritis 03/07/2006   12/21/20:  The patient has not returned since 11/06/20.  She will be discharge at the time.  Rich Number, PT, DPT, OCS, Crt. DN 12/21/2020  Caelyn Route April Ma L Tiandre Teall PT, DPT 11/06/2020, 12:09 PM  [facility] [address] [place], [place], [address] Phone: [phone]   Fax:  [phone]  Name: Felicia Cross MRN: [id number] Date of Birth: January 06, 1933

## 2020-11-11 DIAGNOSIS — M19029 Primary osteoarthritis, unspecified elbow: Secondary | ICD-10-CM | POA: Diagnosis not present

## 2020-11-11 DIAGNOSIS — M25622 Stiffness of left elbow, not elsewhere classified: Secondary | ICD-10-CM | POA: Diagnosis not present

## 2020-11-12 ENCOUNTER — Telehealth: Payer: Self-pay

## 2020-11-12 NOTE — Progress Notes (Deleted)
Subjective:   Felicia Cross is a 87 y.o. female who presents for Medicare Annual (Subsequent) preventive examination.  I connected with [name] today by telephone and verified that I am speaking with the correct person using two identifiers. Location patient: home Location provider: work Persons participating in the virtual visit: patient, provider.   I discussed the limitations, risks, security and privacy concerns of performing an evaluation and management service by telephone and the availability of in person appointments. I also discussed with the patient that there may be a patient responsible charge related to this service. The patient expressed understanding and verbally consented to this telephonic visit.    Interactive audio and video telecommunications were attempted between this provider and patient, however failed, due to patient having technical difficulties OR patient did not have access to video capability.  We continued and completed visit with audio only.    Review of Systems    N/a       Objective:    There were no vitals filed for this visit. There is no height or weight on file to calculate BMI.  Advanced Directives 08/20/2020 07/08/2020 06/21/2018 03/01/2018 12/08/2017 11/06/2017 06/03/2016  Does Patient Have a Medical Advance Directive? No Yes No Yes Yes Yes No  Type of Advance Directive - Hospital doctor of [place];Living will Healthcare Power of [place];Living will - -  Copy of Healthcare Power of Attorney in Chart? - - - No - copy requested No - copy requested - -  Would patient like information on creating a medical advance directive? - - No - Patient declined - - - -    Current Medications (verified) Outpatient Encounter Medications as of 11/12/2020  Medication Sig   aspirin EC 81 MG tablet Take 1 tablet (81 mg total) by mouth daily.   cloNIDine (CATAPRES) 0.1 MG tablet Take 0.1 mg by mouth 2 (two) times daily.    cyanocobalamin 500 MCG tablet Take 500 mcg by mouth daily.   dimenhyDRINATE (DRAMAMINE PO) Take by mouth. As needed for vertigo   gabapentin (NEURONTIN) 100 MG capsule Take 1 capsule by mouth at bedtime   hydrochlorothiazide (HYDRODIURIL) 25 MG tablet Take 1 tablet by mouth once daily   losartan (COZAAR) 100 MG tablet Take 1 tablet by mouth once daily   meclizine (ANTIVERT) 25 MG tablet meclizine 25 mg tablet  TAKE 1 TABLET BY MOUTH THREE TIMES A DAY AS NEEDED DAILY   metoprolol tartrate (LOPRESSOR) 25 MG tablet Take 1 tablet (25 mg total) by mouth 2 (two) times daily.   Multiple Vitamin (MULTIVITAMIN WITH MINERALS) TABS Take 1 tablet by mouth daily.    simvastatin (ZOCOR) 40 MG tablet Take 1 tablet by mouth once daily   No facility-administered encounter medications on file as of 11/12/2020.    Allergies (verified) Codeine and Hydrocodone   History: Past Medical History:  Diagnosis Date   Anemia 01/18/2016   -reports on and off her whole life, on oral iron in the past remotely per report   Arthritis    B12 deficiency 01/18/2016   CAD (coronary artery disease)    CVD (cerebrovascular disease)    Diverticulosis of colon    H. pylori infection 10/20/2015   Hyperlipidemia    Hypertension    Osteoarthritis 03/07/2006   Qualifier: Diagnosis of  By: Cato Mulligan MD, Bruce     Past Surgical History:  Procedure Laterality Date   ABDOMINAL HYSTERECTOMY  1984   REPLACEMENT TOTAL KNEE  2007  left   TOTAL HIP ARTHROPLASTY  2010   Dr. Turner Daniels, right   TOTAL KNEE ARTHROPLASTY Right 09/14/2015   Procedure: TOTAL KNEE ARTHROPLASTY;  Surgeon: Dannielle Huh, MD;  Location: [facility];  Service: Orthopedics;  Laterality: Right;   Family History  Problem Relation Age of Onset   Diabetes Mother    Hypertension Father    Kidney disease Father    Heart attack Sister    Heart attack Brother    Colon cancer Neg Hx    Stomach cancer Neg Hx    Esophageal cancer Neg Hx    Rectal cancer Neg Hx    Social History    Socioeconomic History   Marital status: Married    Spouse name: Not on file   Number of children: Not on file   Years of education: Not on file   Highest education level: Not on file  Occupational History   Not on file  Tobacco Use   Smoking status: Former    Packs/day: 1.00    Years: 30.00    Pack years: 30.00    Types: Cigarettes    Quit date: 11/01/1980    Years since quitting: 40.0   Smokeless tobacco: Never  Substance and Sexual Activity   Alcohol use: Yes    Alcohol/week: 0.0 standard drinks    Comment: glass of wine   Drug use: No   Sexual activity: Not on file  Other Topics Concern   Not on file  Social History Narrative   Updated 01/07/15   Work or School: retired from Geneticist, molecular, 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 [place]      Home 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, Information systems manager and two great grandchildren age 42 and 51      Spiritual Beliefs: Christian      Lifestyle: regular exercise at the [employer]; healthy diet      Social Determinants of Health   Financial Resource Strain: Not on file  Food Insecurity: Not on file  Transportation Needs: Not on file  Physical Activity: Not on file  Stress: Not on file  Social Connections: Not on file    Tobacco Counseling Counseling given: Not Answered   Clinical Intake:                 Diabetic?no         Activities of Daily Living No flowsheet data found.  Patient Care Team: Deeann Saint, MD as PCP - General (Family Medicine) Kirkland Hun, MD as Consulting Physician (Obstetrics and Gynecology) Lewayne Bunting, MD as Consulting Physician (Cardiology) Dannielle Huh, MD as Consulting Physician (Orthopedic Surgery)  Indicate any recent Medical Services you may have received from other than [facility] providers in the past year (date may be approximate).     Assessment:   This is a routine wellness examination for [place].  Hearing/Vision screen No results found.  Dietary issues and exercise activities  discussed:     Goals Addressed   None    Depression Screen PHQ 2/9 Scores 10/21/2019 11/06/2017 06/03/2016 04/18/2016 04/10/2015 04/30/2014  PHQ - 2 Score 0 0 0 0 0 0    Fall Risk Fall Risk  10/21/2019 04/10/2018 11/06/2017 06/03/2016 04/18/2016  Falls in the past year? 1 0 No Yes No  Comment - - - fell in bathroom one month ago  -  Number falls in past yr: 1 - - - -  Injury with Fall? 1 - - - -    FALL RISK PREVENTION PERTAINING TO THE HOME:  Any stairs in or around the home? {YES/NO:21197} If so, are there any without handrails? {YES/NO:21197} Home free of loose throw rugs in walkways, pet beds, electrical cords, etc? {YES/NO:21197} Adequate lighting in your home to reduce risk of falls? {YES/NO:21197}  ASSISTIVE DEVICES UTILIZED TO PREVENT FALLS:  Life alert? {YES/NO:21197} Use of a cane, walker or w/c? {YES/NO:21197} Grab bars in the bathroom? {YES/NO:21197} Shower chair or bench in shower? {YES/NO:21197} Elevated toilet seat or a handicapped toilet? {YES/NO:21197}  TIMED UP AND GO:  Was the test performed? {YES/NO:21197}.  Length of time to ambulate 10 feet: *** sec.   {Appearance of Gait:2101803}  Cognitive Function: MMSE - Mini Mental State Exam 11/06/2017 06/03/2016  Not completed: (No Data) (No Data)        Immunizations Immunization History  Administered Date(s) Administered   Influenza Whole 02/07/2011   Influenza, High Dose Seasonal PF 01/07/2015, 01/23/2015, 01/18/2016, 04/10/2017, 01/25/2018, 02/11/2019   PFIZER(Purple Top)SARS-COV-2 Vaccination 06/17/2019, 07/11/2019   Pneumococcal Conjugate-13 04/30/2014   Pneumococcal Polysaccharide-23 05/09/2002, 02/10/2019   Td 05/09/2004, 05/09/2008, 05/24/2018   Zoster, Live 04/10/2015    {TDAP status:2101805}  {Flu Vaccine status:2101806}  {Pneumococcal vaccine status:2101807}  {Covid-19 vaccine status:2101808}  Qualifies for Shingles Vaccine? {YES/NO:21197}  Zostavax completed {YES/NO:21197}  {Shingrix  Completed?:2101804}  Screening Tests Health Maintenance  Topic Date Due   Zoster Vaccines- Shingrix (1 of 2) Never done   COVID-19 Vaccine (3 - Pfizer risk series) 08/08/2019   INFLUENZA VACCINE  12/07/2020   TETANUS/TDAP  05/24/2028   PNA vac Low Risk Adult  Completed   DEXA SCAN  Addressed   HPV VACCINES  Aged Out    Health Maintenance  Health Maintenance Due  Topic Date Due   Zoster Vaccines- Shingrix (1 of 2) Never done   COVID-19 Vaccine (3 - Pfizer risk series) 08/08/2019    {Colorectal cancer screening:2101809}  {Mammogram status:21018020}  {Bone Density status:21018021}  Lung Cancer Screening: (Low Dose CT Chest recommended if Age 16-80 years, 30 pack-year currently smoking OR have quit w/in 15years.) {DOES NOT does:27190::"does not"} qualify.   Lung Cancer Screening Referral: ***  Additional Screening:  Hepatitis C Screening: {DOES NOT does:27190::"does not"} qualify; Completed ***  Vision Screening: Recommended annual ophthalmology exams for early detection of glaucoma and other disorders of the eye. Is the patient up to date with their annual eye exam?  {YES/NO:21197} Who is the provider or what is the name of the office in which the patient attends annual eye exams? *** If pt is not established with a provider, would they like to be referred to a provider to establish care? {YES/NO:21197}.   Dental Screening: Recommended annual dental exams for proper oral hygiene  Community Resource Referral / Chronic Care Management: CRR required this visit?  {YES/NO:21197}  CCM required this visit?  {YES/NO:21197}     Plan:     I have personally reviewed and noted the following in the patient's chart:   Medical and social history Use of alcohol, tobacco or illicit drugs  Current medications and supplements including opioid prescriptions.  Functional ability and status Nutritional status Physical activity Advanced directives List of other  physicians Hospitalizations, surgeries, and ER visits in previous 12 months Vitals Screenings to include cognitive, depression, and falls Referrals and appointments  In addition, I have reviewed and discussed with patient certain preventive protocols, quality metrics, and best practice recommendations. A written personalized care plan for preventive services as well as general preventive health recommendations were provided to patient.     Vernona Rieger  Sollie Vultaggio, LPN   1/6/10967/11/2020   Nurse Notes: ***

## 2020-11-12 NOTE — Telephone Encounter (Signed)
Called patient for 1030 MWV  , No answer left voice mail message to return call to reschedule next available appointment.   L.Zachory Mangual,LPN

## 2020-11-13 ENCOUNTER — Encounter: Payer: Medicare Other | Admitting: Physical Therapy

## 2020-11-19 ENCOUNTER — Telehealth: Payer: Self-pay | Admitting: Family Medicine

## 2020-11-19 NOTE — Telephone Encounter (Signed)
Left message for patient to call back and schedule Medicare Annual Wellness Visit (AWV) either virtually or in office.   Last AWV 10/21/19  please schedule at anytime with [facility] Nurse Health Advisor 1 or 2   This should be a 45 minute visit.

## 2020-11-20 ENCOUNTER — Ambulatory Visit: Payer: Medicare Other

## 2020-11-25 ENCOUNTER — Ambulatory Visit: Payer: Medicare Other

## 2020-11-25 ENCOUNTER — Telehealth: Payer: Self-pay

## 2020-11-25 NOTE — Telephone Encounter (Signed)
Marirose did not show to her appointment today.  I called her and left a voicemail to inform her of the missed appointment and to remind her of her next appointment with outpatient physical therapy.  Sharol Roussel, PT, DPT, OCS, Crt. DN

## 2020-11-27 DIAGNOSIS — M19029 Primary osteoarthritis, unspecified elbow: Secondary | ICD-10-CM | POA: Diagnosis not present

## 2020-11-27 DIAGNOSIS — M25622 Stiffness of left elbow, not elsewhere classified: Secondary | ICD-10-CM | POA: Diagnosis not present

## 2020-11-27 DIAGNOSIS — S63639D Sprain of interphalangeal joint of unspecified finger, subsequent encounter: Secondary | ICD-10-CM | POA: Diagnosis not present

## 2020-12-01 ENCOUNTER — Telehealth (INDEPENDENT_AMBULATORY_CARE_PROVIDER_SITE_OTHER): Payer: Medicare Other

## 2020-12-01 ENCOUNTER — Other Ambulatory Visit: Payer: Self-pay

## 2020-12-01 DIAGNOSIS — Z0000 Encounter for general adult medical examination without abnormal findings: Secondary | ICD-10-CM | POA: Diagnosis not present

## 2020-12-01 NOTE — Progress Notes (Signed)
Virtual Visit via Telephone Note  I connected with  Felicia Cross on 12/01/20 at 11:00 AM EDT by telephone and verified that I am speaking with the correct person using two identifiers.  Medicare Annual Wellness visit completed telephonically due to Covid-19 pandemic.   Persons participating in this call: This Health Coach and this patient.   Location: Patient: home Provider: office   I discussed the limitations, risks, security and privacy concerns of performing an evaluation and management service by telephone and the availability of in person appointments. The patient expressed understanding and agreed to proceed.  Unable to perform video visit due to video visit attempted and failed and/or patient does not have video capability.   Some vital signs may be absent or patient reported.   Marzella Schlein, LPN   Subjective:   Felicia Cross is a 87 y.o. female who presents for Medicare Annual (Subsequent) preventive examination.  Review of Systems     Cardiac Risk Factors include: advanced age (>67men, >58 women);hypertension;dyslipidemia;obesity (BMI >30kg/m2)     Objective:    There were no vitals filed for this visit. There is no height or weight on file to calculate BMI.  Advanced Directives 12/01/2020 08/20/2020 07/08/2020 06/21/2018 03/01/2018 12/08/2017 11/06/2017  Does Patient Have a Medical Advance Directive? Yes No Yes No Yes Yes Yes  Type of Advance Directive Living will - Healthcare Power of Attorney - Healthcare Power of [place];Living will Healthcare Power of [place];Living will -  Copy of Healthcare Power of Attorney in Chart? - - - - No - copy requested No - copy requested -  Would patient like information on creating a medical advance directive? - - - No - Patient declined - - -    Current Medications (verified) Outpatient Encounter Medications as of 12/01/2020  Medication Sig   aspirin EC 81 MG tablet Take 1 tablet (81 mg total) by mouth daily.   cloNIDine  (CATAPRES) 0.1 MG tablet Take 0.1 mg by mouth 2 (two) times daily.   cyanocobalamin 500 MCG tablet Take 500 mcg by mouth daily.   dimenhyDRINATE (DRAMAMINE PO) Take by mouth. As needed for vertigo   gabapentin (NEURONTIN) 100 MG capsule Take 1 capsule by mouth at bedtime   hydrochlorothiazide (HYDRODIURIL) 25 MG tablet Take 1 tablet by mouth once daily   losartan (COZAAR) 100 MG tablet Take 1 tablet by mouth once daily   meclizine (ANTIVERT) 25 MG tablet meclizine 25 mg tablet  TAKE 1 TABLET BY MOUTH THREE TIMES A DAY AS NEEDED DAILY   metoprolol tartrate (LOPRESSOR) 25 MG tablet Take 1 tablet (25 mg total) by mouth 2 (two) times daily.   Multiple Vitamin (MULTIVITAMIN WITH MINERALS) TABS Take 1 tablet by mouth daily.    simvastatin (ZOCOR) 40 MG tablet Take 1 tablet by mouth once daily   No facility-administered encounter medications on file as of 12/01/2020.    Allergies (verified) Codeine and Hydrocodone   History: Past Medical History:  Diagnosis Date   Anemia 01/18/2016   -reports on and off her whole life, on oral iron in the past remotely per report   Arthritis    B12 deficiency 01/18/2016   CAD (coronary artery disease)    CVD (cerebrovascular disease)    Diverticulosis of colon    H. pylori infection 10/20/2015   Hyperlipidemia    Hypertension    Osteoarthritis 03/07/2006   Qualifier: Diagnosis of  By: Cato Mulligan MD, Bruce     Past Surgical History:  Procedure Laterality Date  ABDOMINAL HYSTERECTOMY  1984   REPLACEMENT TOTAL KNEE  2007   left   TOTAL HIP ARTHROPLASTY  2010   Dr. Turner Daniels, right   TOTAL KNEE ARTHROPLASTY Right 09/14/2015   Procedure: TOTAL KNEE ARTHROPLASTY;  Surgeon: Dannielle Huh, MD;  Location: [facility] OR;  Service: Orthopedics;  Laterality: Right;   Family History  Problem Relation Age of Onset   Diabetes Mother    Hypertension Father    Kidney disease Father    Heart attack Sister    Heart attack Brother    Colon cancer Neg Hx    Stomach cancer Neg Hx     Esophageal cancer Neg Hx    Rectal cancer Neg Hx    Social History   Socioeconomic History   Marital status: Married    Spouse name: Not on file   Number of children: Not on file   Years of education: Not on file   Highest education level: Not on file  Occupational History   Not on file  Tobacco Use   Smoking status: Former    Packs/day: 1.00    Years: 30.00    Pack years: 30.00    Types: Cigarettes    Quit date: 11/01/1980    Years since quitting: 40.1   Smokeless tobacco: Never  Substance and Sexual Activity   Alcohol use: Yes    Alcohol/week: 0.0 standard drinks    Comment: glass of wine   Drug use: No   Sexual activity: Not on file  Other Topics Concern   Not on file  Social History Narrative   Updated 01/07/15   Work or School: retired from Geneticist, molecular, 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 [place]      Home 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, Information systems manager and two great grandchildren age 56 and 66      Spiritual Beliefs: Christian      Lifestyle: regular exercise at the [employer]; healthy diet      Social Determinants of Health   Financial Resource Strain: Not on file  Food Insecurity: Not on file  Transportation Needs: Not on file  Physical Activity: Not on file  Stress: Not on file  Social Connections: Not on file    Tobacco Counseling Counseling given: Not Answered   Clinical Intake:  Pre-visit preparation completed: Yes  Pain : No/denies pain     BMI - recorded: 32.6 Nutritional Status: BMI > 30  Obese Nutritional Risks: None Diabetes: No  How often do you need to have someone help you when you read instructions, pamphlets, or other written materials from your doctor or pharmacy?: 1 - Never  Diabetic?no  Interpreter Needed?: No  Information entered by :: Lanier Ensign, LPN   Activities of Daily Living In your present state of health, do you have any difficulty performing the following activities: 12/01/2020  Hearing? N  Vision? N  Difficulty  concentrating or making decisions? N  Walking or climbing stairs? N  Dressing or bathing? N  Doing errands, shopping? N  Preparing Food and eating ? N  Using the Toilet? N  In the past six months, have you accidently leaked urine? N  Do you have problems with loss of bowel control? N  Managing your Medications? N  Managing your Finances? N  Housekeeping or managing your Housekeeping? N  Some recent data might be hidden    Patient Care Team: Deeann Saint, MD as PCP - General (Family Medicine) Kirkland Hun, MD as Consulting Physician (Obstetrics and Gynecology) Lewayne Bunting, MD as  Consulting Physician (Cardiology) Dannielle HuhLucey, Steve, MD as Consulting Physician (Orthopedic Surgery)  Indicate any recent Medical Services you may have received from other than [facility] providers in the past year (date may be approximate).     Assessment:   This is a routine wellness examination for [place].  Hearing/Vision screen Hearing Screening - Comments:: Pt denies any hearing issues  Vision Screening - Comments:: Pt follows up with my eye dr for annual eye exams   Dietary issues and exercise activities discussed: Current Exercise Habits: Home exercise routine, Time (Minutes): 45, Frequency (Times/Week): 2, Weekly Exercise (Minutes/Week): 90   Goals Addressed             This Visit's Progress    Patient Stated       Get rid of walker and cane        Depression Screen PHQ 2/9 Scores 12/01/2020 10/21/2019 11/06/2017 06/03/2016 04/18/2016 04/10/2015 04/30/2014  PHQ - 2 Score 0 0 0 0 0 0 0    Fall Risk Fall Risk  12/01/2020 10/21/2019 04/10/2018 11/06/2017 06/03/2016  Falls in the past year? 1 1 0 No Yes  Comment - - - - fell in bathroom one month ago   Number falls in past yr: 0 1 - - -  Injury with Fall? 1 1 - - -  Comment broken elbow and left ring finger - - - -  Risk for fall due to : History of fall(s);Impaired balance/gait;Impaired mobility - - - -  Follow up Falls prevention  discussed - - - -    FALL RISK PREVENTION PERTAINING TO THE HOME:  Any stairs in or around the home? Yes  If so, are there any without handrails? No  Home free of loose throw rugs in walkways, pet beds, electrical cords, etc? Yes  Adequate lighting in your home to reduce risk of falls? Yes   ASSISTIVE DEVICES UTILIZED TO PREVENT FALLS:  Life alert? No  Use of a cane, walker or w/c? Yes  Grab bars in the bathroom? Yes  Shower chair or bench in shower? No  Elevated toilet seat or a handicapped toilet? No   TIMED UP AND GO:  Was the test performed? No     Cognitive Function: MMSE - Mini Mental State Exam 11/06/2017 06/03/2016  Not completed: (No Data) (No Data)     6CIT Screen 12/01/2020  What Year? 0 points  What month? 0 points  What time? 0 points  Count back from 20 0 points  Months in reverse 0 points  Repeat phrase 2 points  Total Score 2    Immunizations Immunization History  Administered Date(s) Administered   Influenza Whole 02/07/2011   Influenza, High Dose Seasonal PF 01/07/2015, 01/23/2015, 01/18/2016, 04/10/2017, 01/25/2018, 02/11/2019   PFIZER(Purple Top)SARS-COV-2 Vaccination 06/17/2019, 07/11/2019   Pneumococcal Conjugate-13 04/30/2014   Pneumococcal Polysaccharide-23 05/09/2002, 02/10/2019   Td 05/09/2004, 05/09/2008, 05/24/2018   Zoster, Live 04/10/2015    TDAP status: Up to date  Flu Vaccine status: Due, Education has been provided regarding the importance of this vaccine. Advised may receive this vaccine at local pharmacy or Health Dept. Aware to provide a copy of the vaccination record if obtained from local pharmacy or Health Dept. Verbalized acceptance and understanding.  Pneumococcal vaccine status: Up to date  Covid-19 vaccine status: Completed vaccines  Qualifies for Shingles Vaccine? Yes   Zostavax completed No   Shingrix Completed?: No.    Education has been provided regarding the importance of this vaccine. Patient has been advised to  call insurance company to determine out of pocket expense if they have not yet received this vaccine. Advised may also receive vaccine at local pharmacy or Health Dept. Verbalized acceptance and understanding.  Screening Tests Health Maintenance  Topic Date Due   Zoster Vaccines- Shingrix (1 of 2) Never done   COVID-19 Vaccine (3 - Pfizer risk series) 08/08/2019   INFLUENZA VACCINE  12/07/2020   TETANUS/TDAP  05/24/2028   PNA vac Low Risk Adult  Completed   DEXA SCAN  Addressed   HPV VACCINES  Aged Out    Health Maintenance  Health Maintenance Due  Topic Date Due   Zoster Vaccines- Shingrix (1 of 2) Never done   COVID-19 Vaccine (3 - Pfizer risk series) 08/08/2019    Colorectal cancer screening: No longer required.   Mammogram status: No longer required due to age .  Bone Density status: Completed 04/10/15. Results reflect: Bone density results: NORMAL. Repeat every 2 years.    Additional Screening:   Vision Screening: Recommended annual ophthalmology exams for early detection of glaucoma and other disorders of the eye. Is the patient up to date with their annual eye exam?  Yes  Who is the provider or what is the name of the office in which the patient attends annual eye exams? [facility] Dr If pt is not established with a provider, would they like to be referred to a provider to establish care? No .   Dental Screening: Recommended annual dental exams for proper oral hygiene  Community Resource Referral / Chronic Care Management: CRR required this visit?  No   CCM required this visit?  No      Plan:     I have personally reviewed and noted the following in the patient's chart:   Medical and social history Use of alcohol, tobacco or illicit drugs  Current medications and supplements including opioid prescriptions.  Functional ability and status Nutritional status Physical activity Advanced directives List of other physicians Hospitalizations, surgeries, and ER  visits in previous 12 months Vitals Screenings to include cognitive, depression, and falls Referrals and appointments  In addition, I have reviewed and discussed with patient certain preventive protocols, quality metrics, and best practice recommendations. A written personalized care plan for preventive services as well as general preventive health recommendations were provided to patient.     Marzella Schlein, LPN   [id number]   Nurse Notes: None

## 2020-12-01 NOTE — Patient Instructions (Signed)
Felicia Cross , Thank you for taking time to come for your Medicare Wellness Visit. I appreciate your ongoing commitment to your health goals. Please review the following plan we discussed and let me know if I can assist you in the future.   Screening recommendations/referrals: Colonoscopy: No longer required  Mammogram: No  longer required  Bone Density: Done 04/10/15 Recommended yearly ophthalmology/optometry visit for glaucoma screening and checkup Recommended yearly dental visit for hygiene and checkup  Vaccinations: Influenza vaccine: Due after 12/07/20 Pneumococcal vaccine: Completed  Tdap vaccine: Done 05/24/18 Shingles vaccine: Shingrix discussed. Please contact your pharmacy for coverage information.    Covid-19:Completed 06/17/19 , 07/11/19 & 02/05/20  Advanced directives: Please bring a copy of your health care power of attorney and living will to the office at your convenience.  Conditions/risks identified: get rid of walker and cane   Next appointment: Follow up in one year for your annual wellness visit    Preventive Care 65 Years and Older, Female Preventive care refers to lifestyle choices and visits with your health care provider that can promote health and wellness. What does preventive care include? A yearly physical exam. This is also called an annual well check. Dental exams once or twice a year. Routine eye exams. Ask your health care provider how often you should have your eyes checked. Personal lifestyle choices, including: Daily care of your teeth and gums. Regular physical activity. Eating a healthy diet. Avoiding tobacco and drug use. Limiting alcohol use. Practicing safe sex. Taking low-dose aspirin every day. Taking vitamin and mineral supplements as recommended by your health care provider. What happens during an annual well check? The services and screenings done by your health care provider during your annual well check will depend on your age, overall  health, lifestyle risk factors, and family history of disease. Counseling  Your health care provider may ask you questions about your: Alcohol use. Tobacco use. Drug use. Emotional well-being. Home and relationship well-being. Sexual activity. Eating habits. History of falls. Memory and ability to understand (cognition). Work and work Astronomer. Reproductive health. Screening  You may have the following tests or measurements: Height, weight, and BMI. Blood pressure. Lipid and cholesterol levels. These may be checked every 5 years, or more frequently if you are over 12 years old. Skin check. Lung cancer screening. You may have this screening every year starting at age 74 if you have a 30-pack-year history of smoking and currently smoke or have quit within the past 15 years. Fecal occult blood test (FOBT) of the stool. You may have this test every year starting at age 63. Flexible sigmoidoscopy or colonoscopy. You may have a sigmoidoscopy every 5 years or a colonoscopy every 10 years starting at age 65. Hepatitis C blood test. Hepatitis B blood test. Sexually transmitted disease (STD) testing. Diabetes screening. This is done by checking your blood sugar (glucose) after you have not eaten for a while (fasting). You may have this done every 1-3 years. Bone density scan. This is done to screen for osteoporosis. You may have this done starting at age 19. Mammogram. This may be done every 1-2 years. Talk to your health care provider about how often you should have regular mammograms. Talk with your health care provider about your test results, treatment options, and if necessary, the need for more tests. Vaccines  Your health care provider may recommend certain vaccines, such as: Influenza vaccine. This is recommended every year. Tetanus, diphtheria, and acellular pertussis (Tdap, Td) vaccine. You may  need a Td booster every 10 years. Zoster vaccine. You may need this after age  35. Pneumococcal 13-valent conjugate (PCV13) vaccine. One dose is recommended after age 78. Pneumococcal polysaccharide (PPSV23) vaccine. One dose is recommended after age 72. Talk to your health care provider about which screenings and vaccines you need and how often you need them. This information is not intended to replace advice given to you by your health care provider. Make sure you discuss any questions you have with your health care provider. Document Released: 05/22/2015 Document Revised: 01/13/2016 Document Reviewed: 02/24/2015 Elsevier Interactive Patient Education  2017 [place] Prevention in the Home Falls can cause injuries. They can happen to people of all ages. There are many things you can do to make your home safe and to help prevent falls. What can I do on the outside of my home? Regularly fix the edges of walkways and driveways and fix any cracks. Remove anything that might make you trip as you walk through a door, such as a raised step or threshold. Trim any bushes or trees on the path to your home. Use bright outdoor lighting. Clear any walking paths of anything that might make someone trip, such as rocks or tools. Regularly check to see if handrails are loose or broken. Make sure that both sides of any steps have handrails. Any raised decks and porches should have guardrails on the edges. Have any leaves, snow, or ice cleared regularly. Use sand or salt on walking paths during winter. Clean up any spills in your garage right away. This includes oil or grease spills. What can I do in the bathroom? Use night lights. Install grab bars by the toilet and in the tub and shower. Do not use towel bars as grab bars. Use non-skid mats or decals in the tub or shower. If you need to sit down in the shower, use a plastic, non-slip stool. Keep the floor dry. Clean up any water that spills on the floor as soon as it happens. Remove soap buildup in the tub or shower  regularly. Attach bath mats securely with double-sided non-slip rug tape. Do not have throw rugs and other things on the floor that can make you trip. What can I do in the bedroom? Use night lights. Make sure that you have a light by your bed that is easy to reach. Do not use any sheets or blankets that are too big for your bed. They should not hang down onto the floor. Have a firm chair that has side arms. You can use this for support while you get dressed. Do not have throw rugs and other things on the floor that can make you trip. What can I do in the kitchen? Clean up any spills right away. Avoid walking on wet floors. Keep items that you use a lot in easy-to-reach places. If you need to reach something above you, use a strong step stool that has a grab bar. Keep electrical cords out of the way. Do not use floor polish or wax that makes floors slippery. If you must use wax, use non-skid floor wax. Do not have throw rugs and other things on the floor that can make you trip. What can I do with my stairs? Do not leave any items on the stairs. Make sure that there are handrails on both sides of the stairs and use them. Fix handrails that are broken or loose. Make sure that handrails are as long as the stairways.  Check any carpeting to make sure that it is firmly attached to the stairs. Fix any carpet that is loose or worn. Avoid having throw rugs at the top or bottom of the stairs. If you do have throw rugs, attach them to the floor with carpet tape. Make sure that you have a light switch at the top of the stairs and the bottom of the stairs. If you do not have them, ask someone to add them for you. What else can I do to help prevent falls? Wear shoes that: Do not have high heels. Have rubber bottoms. Are comfortable and fit you well. Are closed at the toe. Do not wear sandals. If you use a stepladder: Make sure that it is fully opened. Do not climb a closed stepladder. Make sure that  both sides of the stepladder are locked into place. Ask someone to hold it for you, if possible. Clearly mark and make sure that you can see: Any grab bars or handrails. First and last steps. Where the edge of each step is. Use tools that help you move around (mobility aids) if they are needed. These include: Canes. Walkers. Scooters. Crutches. Turn on the lights when you go into a dark area. Replace any light bulbs as soon as they burn out. Set up your furniture so you have a clear path. Avoid moving your furniture around. If any of your floors are uneven, fix them. If there are any pets around you, be aware of where they are. Review your medicines with your doctor. Some medicines can make you feel dizzy. This can increase your chance of falling. Ask your doctor what other things that you can do to help prevent falls. This information is not intended to replace advice given to you by your health care provider. Make sure you discuss any questions you have with your health care provider. Document Released: 02/19/2009 Document Revised: 10/01/2015 Document Reviewed: 05/30/2014 Elsevier Interactive Patient Education  2017 [employer].

## 2020-12-06 ENCOUNTER — Encounter (HOSPITAL_BASED_OUTPATIENT_CLINIC_OR_DEPARTMENT_OTHER): Payer: Self-pay | Admitting: Obstetrics and Gynecology

## 2020-12-06 ENCOUNTER — Other Ambulatory Visit: Payer: Self-pay

## 2020-12-06 ENCOUNTER — Emergency Department (HOSPITAL_BASED_OUTPATIENT_CLINIC_OR_DEPARTMENT_OTHER)
Admission: EM | Admit: 2020-12-06 | Discharge: 2020-12-06 | Disposition: A | Discharge status: Home / Self Care | Payer: Medicare Other | Attending: Emergency Medicine | Admitting: Emergency Medicine

## 2020-12-06 ENCOUNTER — Emergency Department (HOSPITAL_BASED_OUTPATIENT_CLINIC_OR_DEPARTMENT_OTHER): Payer: Medicare Other | Admitting: Radiology

## 2020-12-06 ENCOUNTER — Emergency Department (HOSPITAL_BASED_OUTPATIENT_CLINIC_OR_DEPARTMENT_OTHER): Payer: Medicare Other

## 2020-12-06 DIAGNOSIS — I2510 Atherosclerotic heart disease of native coronary artery without angina pectoris: Secondary | ICD-10-CM | POA: Insufficient documentation

## 2020-12-06 DIAGNOSIS — S63280D Dislocation of proximal interphalangeal joint of right index finger, subsequent encounter: Secondary | ICD-10-CM | POA: Diagnosis not present

## 2020-12-06 DIAGNOSIS — M25512 Pain in left shoulder: Secondary | ICD-10-CM | POA: Diagnosis not present

## 2020-12-06 DIAGNOSIS — Z79899 Other long term (current) drug therapy: Secondary | ICD-10-CM | POA: Diagnosis not present

## 2020-12-06 DIAGNOSIS — S63259A Unspecified dislocation of unspecified finger, initial encounter: Secondary | ICD-10-CM

## 2020-12-06 DIAGNOSIS — S0990XA Unspecified injury of head, initial encounter: Secondary | ICD-10-CM | POA: Insufficient documentation

## 2020-12-06 DIAGNOSIS — W01198A Fall on same level from slipping, tripping and stumbling with subsequent striking against other object, initial encounter: Secondary | ICD-10-CM | POA: Diagnosis not present

## 2020-12-06 DIAGNOSIS — Z87891 Personal history of nicotine dependence: Secondary | ICD-10-CM | POA: Diagnosis not present

## 2020-12-06 DIAGNOSIS — W19XXXA Unspecified fall, initial encounter: Secondary | ICD-10-CM | POA: Diagnosis not present

## 2020-12-06 DIAGNOSIS — I10 Essential (primary) hypertension: Secondary | ICD-10-CM | POA: Insufficient documentation

## 2020-12-06 DIAGNOSIS — S01511A Laceration without foreign body of lip, initial encounter: Secondary | ICD-10-CM | POA: Diagnosis not present

## 2020-12-06 DIAGNOSIS — S63280A Dislocation of proximal interphalangeal joint of right index finger, initial encounter: Secondary | ICD-10-CM | POA: Diagnosis not present

## 2020-12-06 DIAGNOSIS — Z96651 Presence of right artificial knee joint: Secondary | ICD-10-CM | POA: Diagnosis not present

## 2020-12-06 DIAGNOSIS — R55 Syncope and collapse: Secondary | ICD-10-CM | POA: Diagnosis not present

## 2020-12-06 DIAGNOSIS — Z7982 Long term (current) use of aspirin: Secondary | ICD-10-CM | POA: Diagnosis not present

## 2020-12-06 DIAGNOSIS — Z96641 Presence of right artificial hip joint: Secondary | ICD-10-CM | POA: Diagnosis not present

## 2020-12-06 DIAGNOSIS — S6991XA Unspecified injury of right wrist, hand and finger(s), initial encounter: Secondary | ICD-10-CM | POA: Diagnosis present

## 2020-12-06 DIAGNOSIS — Z043 Encounter for examination and observation following other accident: Secondary | ICD-10-CM | POA: Diagnosis not present

## 2020-12-06 LAB — URINALYSIS, ROUTINE W REFLEX MICROSCOPIC
Bilirubin Urine: NEGATIVE
Glucose, UA: NEGATIVE mg/dL
Hgb urine dipstick: NEGATIVE
Ketones, ur: NEGATIVE mg/dL
Nitrite: NEGATIVE
Protein, ur: NEGATIVE mg/dL
Specific Gravity, Urine: 1.012 (ref 1.005–1.030)
pH: 7 (ref 5.0–8.0)

## 2020-12-06 MED ORDER — ACETAMINOPHEN 325 MG PO TABS
650.0000 mg | ORAL_TABLET | Freq: Once | ORAL | Status: AC
  Administered 2020-12-06: 650 mg via ORAL
  Filled 2020-12-06: qty 2

## 2020-12-06 MED ORDER — LIDOCAINE HCL (PF) 1 % IJ SOLN
10.0000 mL | Freq: Once | INTRAMUSCULAR | Status: AC
  Administered 2020-12-06: 10 mL
  Filled 2020-12-06: qty 10

## 2020-12-06 NOTE — ED Provider Notes (Signed)
[facility] EMERGENCY DEPT Provider Note   CSN: [id number] Arrival date & time: 12/06/20  1546     History Chief Complaint  Patient presents with   Marletta Lor    Felicia Cross is a 87 y.o. female.  Presented to ER with concern for fall.  Patient reports that she had a fall yesterday and then a second fall this afternoon.  Unsure exactly why she fell today.  Did not pass out.  Last night did not have any injuries did not seek care.  This afternoon she hit her front lip as well as injured her right index finger.  Pain is worse in her right index finger, worse with movement, improved with rest.  Has deformity to her right finger.  Both falls were witnessed by family members.  HPI     Past Medical History:  Diagnosis Date   Anemia 01/18/2016   -reports on and off her whole life, on oral iron in the past remotely per report   Arthritis    B12 deficiency 01/18/2016   CAD (coronary artery disease)    CVD (cerebrovascular disease)    Diverticulosis of colon    H. pylori infection 10/20/2015   Hyperlipidemia    Hypertension    Osteoarthritis 03/07/2006   Qualifier: Diagnosis of  By: Cato Mulligan MD, Bruce      Patient Active Problem List   Diagnosis Date Noted   Vertigo 07/06/2018   B12 deficiency 01/18/2016   Anemia 01/18/2016   S/P total knee replacement 09/14/2015   Carotid arterial disease (HCC) 05/27/2009   Dyslipidemia 06/27/2008   Coronary atherosclerosis 11/06/2006   Essential hypertension 03/07/2006   Osteoarthritis 03/07/2006    Past Surgical History:  Procedure Laterality Date   ABDOMINAL HYSTERECTOMY  1984   REPLACEMENT TOTAL KNEE  2007   left   TOTAL HIP ARTHROPLASTY  2010   Dr. Turner Daniels, right   TOTAL KNEE ARTHROPLASTY Right 09/14/2015   Procedure: TOTAL KNEE ARTHROPLASTY;  Surgeon: Dannielle Huh, MD;  Location: [facility] OR;  Service: Orthopedics;  Laterality: Right;     OB History   No obstetric history on file.     Family History  Problem Relation Age of  Onset   Diabetes Mother    Hypertension Father    Kidney disease Father    Heart attack Sister    Heart attack Brother    Colon cancer Neg Hx    Stomach cancer Neg Hx    Esophageal cancer Neg Hx    Rectal cancer Neg Hx     Social History   Tobacco Use   Smoking status: Former    Packs/day: 1.00    Years: 30.00    Pack years: 30.00    Types: Cigarettes    Quit date: 11/01/1980    Years since quitting: 40.1   Smokeless tobacco: Never  Substance Use Topics   Alcohol use: Yes    Alcohol/week: 0.0 standard drinks    Comment: glass of wine   Drug use: No    Home Medications Prior to Admission medications   Medication Sig Start Date End Date Taking? Authorizing Provider  aspirin EC 81 MG tablet Take 1 tablet (81 mg total) by mouth daily. 08/18/16   Terressa Koyanagi, DO  cloNIDine (CATAPRES) 0.1 MG tablet Take 0.1 mg by mouth 2 (two) times daily. 07/05/19   [provider]  cyanocobalamin 500 MCG tablet Take 500 mcg by mouth daily.    [provider]  dimenhyDRINATE (DRAMAMINE PO) Take by  mouth. As needed for vertigo    [provider]  gabapentin (NEURONTIN) 100 MG capsule Take 1 capsule by mouth at bedtime 06/01/20   Deeann Saint, MD  hydrochlorothiazide (HYDRODIURIL) 25 MG tablet Take 1 tablet by mouth once daily 09/25/20   Deeann Saint, MD  losartan (COZAAR) 100 MG tablet Take 1 tablet by mouth once daily 09/28/20   Lewayne Bunting, MD  meclizine (ANTIVERT) 25 MG tablet meclizine 25 mg tablet  TAKE 1 TABLET BY MOUTH THREE TIMES A DAY AS NEEDED DAILY    [provider]  metoprolol tartrate (LOPRESSOR) 25 MG tablet Take 1 tablet (25 mg total) by mouth 2 (two) times daily. 12/04/19   Lewayne Bunting, MD  Multiple Vitamin (MULTIVITAMIN WITH MINERALS) TABS Take 1 tablet by mouth daily.     [provider]  simvastatin (ZOCOR) 40 MG tablet Take 1 tablet by mouth once daily 08/17/20   Lewayne Bunting, MD    Allergies    Codeine and  Hydrocodone  Review of Systems   Review of Systems  Constitutional:  Negative for chills and fever.  HENT:  Negative for ear pain and sore throat.        Facial pain  Eyes:  Negative for pain and visual disturbance.  Respiratory:  Negative for cough and shortness of breath.   Cardiovascular:  Negative for chest pain and palpitations.  Gastrointestinal:  Negative for abdominal pain and vomiting.  Genitourinary:  Negative for dysuria and hematuria.  Musculoskeletal:  Positive for arthralgias. Negative for back pain.  Skin:  Negative for color change and rash.  Neurological:  Negative for seizures and syncope.  All other systems reviewed and are negative.  Physical Exam Updated Vital Signs BP (!) 171/144   Pulse 86   Temp 97.9 F (36.6 C) (Oral)   Resp 20   SpO2 100%   Physical Exam Vitals and nursing note reviewed.  Constitutional:      General: She is not in acute distress.    Appearance: She is well-developed.  HENT:     Head: Normocephalic.     Comments: Two centimeter laceration to the upper lip along the buccal mucosa adjacent to the vermilion border but laceration does not cross vermilion border Eyes:     Conjunctiva/sclera: Conjunctivae normal.  Cardiovascular:     Rate and Rhythm: Normal rate and regular rhythm.     Heart sounds: No murmur heard. Pulmonary:     Effort: Pulmonary effort is normal. No respiratory distress.     Breath sounds: Normal breath sounds.  Abdominal:     Palpations: Abdomen is soft.     Tenderness: There is no abdominal tenderness.  Musculoskeletal:     Cervical back: Neck supple.     Comments: Back: no C, T, L spine TTP, no step off or deformity RUE: Obvious deformity to right index finger, tenderness to palpation to index finger but otherwise no deformities noted, radial pulse intact, distal sensation and motor intact LUE: Some tenderness to the left shoulder, no deformity, normal joint ROM, radial pulse intact, distal sensation and  motor intact RLE:  no TTP throughout, no deformity, normal joint ROM, distal pulse, sensation and motor intact LLE: no TTP throughout, no deformity, normal joint ROM, distal pulse, sensation and motor intact  Skin:    General: Skin is warm and dry.  Neurological:     General: No focal deficit present.     Mental Status: She is alert. Mental status  is at baseline.  Psychiatric:        Mood and Affect: Mood normal.        Behavior: Behavior normal.    ED Results / Procedures / Treatments   Labs (all labs ordered are listed, but only abnormal results are displayed) Labs Reviewed  URINALYSIS, ROUTINE W REFLEX MICROSCOPIC - Abnormal; Notable for the following components:      Result Value   Leukocytes,Ua MODERATE (*)    Bacteria, UA RARE (*)    All other components within normal limits  CBC WITH DIFFERENTIAL/PLATELET    EKG None  Radiology DG Chest 1 View  Result Date: 12/06/2020 CLINICAL DATA:  Syncope EXAM: CHEST  1 VIEW COMPARISON:  Chest radiograph 06/21/2018 FINDINGS: Normal cardiac and mediastinal contours. No consolidative pulmonary opacities. No pleural effusion or pneumothorax. Osseous structures unremarkable. IMPRESSION: No acute cardiopulmonary process. Electronically Signed   By: Annia Beltrew  Davis M.D.   On: 12/06/2020 17:43   CT Head Wo Contrast  Result Date: 12/06/2020 CLINICAL DATA:  Patient status post fall. EXAM: CT HEAD WITHOUT CONTRAST TECHNIQUE: Contiguous axial images were obtained from the base of the skull through the vertex without intravenous contrast. COMPARISON:  None. FINDINGS: Brain: Ventricles and sulci are appropriate for patient's age. No evidence for acute cortically based infarct, intracranial hemorrhage, mass lesion mass-effect. Bilateral basal ganglia calcifications. Vascular: Unremarkable. Skull: Intact. Sinuses/Orbits: Paranasal sinuses are unremarkable. Mastoid air cells are unremarkable. Other: None. IMPRESSION: No acute intracranial process  Electronically Signed   By: Annia Beltrew  Davis M.D.   On: 12/06/2020 17:40   DG Shoulder Left  Result Date: 12/06/2020 CLINICAL DATA:  Left shoulder pain following fall EXAM: LEFT SHOULDER - 2+ VIEW COMPARISON:  None. FINDINGS: Normal alignment. No fracture or dislocation. Degenerative changes are noted within the glenohumeral and acromioclavicular joint with asymmetric joint space narrowing. Limited evaluation of the left apex is unremarkable. IMPRESSION: No acute fracture or dislocation Electronically Signed   By: Helyn NumbersAshesh  Parikh MD   On: 12/06/2020 19:48   DG Hand Complete Right  Result Date: 12/06/2020 CLINICAL DATA:  Fall, head injury EXAM: RIGHT HAND - COMPLETE 3+ VIEW COMPARISON:  None. FINDINGS: Dorsal dislocation noted at the right 2nd PIP joint. No visible fracture. Soft tissues are intact. IMPRESSION: Dorsal dislocation at the right 2nd PIP joint. Electronically Signed   By: Charlett NoseKevin  Dover M.D.   On: 12/06/2020 16:24   DG Finger Index Right  Result Date: 12/06/2020 CLINICAL DATA:  Post reduction EXAM: RIGHT INDEX FINGER 2+V COMPARISON:  Hand radiograph earlier today. FINDINGS: Please note the fifth digit was also image in invert [place] in addition to the index finger. The previous dorsal dislocation of the index finger at the proximal interphalangeal joint has been reduced. Alignment is now anatomic. Small calcified density projects about the ulnar aspect of the proximal phalanx, possible fracture fragment with donor site uncertain. Mild soft tissue edema. The imaged fifth digit appears intact. IMPRESSION: Successful reduction of previous index finger dislocation. Small calcified density projects about the ulnar aspect of the proximal phalanx, possible fracture fragment with donor site uncertain. Electronically Signed   By: Narda RutherfordMelanie  Sanford M.D.   On: 12/06/2020 18:35    Procedures .Ortho Injury Treatment  Date/Time: 12/06/2020 11:48 PM Performed by: Milagros Lollykstra, Tommie Bohlken S, MD Authorized by: Milagros Lollykstra,  Annette Bertelson S, MD   Consent:    Consent obtained:  Verbal   Consent given by:  Patient   Risks discussed:  Fracture, irreducible dislocation, recurrent dislocation, restricted joint movement, vascular  damage, stiffness and nerve damage   Alternatives discussed:  No treatmentInjury location: finger Location details: left index finger Injury type: dislocation Dislocation type: PIP Pre-procedure neurovascular assessment: neurovascularly intact Pre-procedure distal perfusion: normal Pre-procedure neurological function: normal Pre-procedure range of motion: reduced Anesthesia: digital block  Anesthesia: Local anesthesia used: yes Local Anesthetic: lidocaine 1% without epinephrine Anesthetic total: 3 mL  Patient sedated: NoManipulation performed: yes Reduction successful: yes X-ray confirmed reduction: yes Immobilization: splint Splint type: static finger Splint Applied by: ED Tech Post-procedure neurovascular assessment: post-procedure neurovascularly intact Post-procedure distal perfusion: normal Post-procedure neurological function: normal Post-procedure range of motion: normal Comments:      Marland KitchenMarland KitchenLaceration Repair  Date/Time: 12/06/2020 11:49 PM Performed by: Milagros Loll, MD Authorized by: Milagros Loll, MD   Consent:    Consent obtained:  Verbal   Consent given by:  Patient   Risks, benefits, and alternatives were discussed: yes     Risks discussed:  Infection, need for additional repair, nerve damage, poor wound healing, poor cosmetic result and pain   Alternatives discussed:  No treatment Universal protocol:    Immediately prior to procedure, a time out was called: yes     Patient identity confirmed:  Verbally with patient Anesthesia:    Anesthesia method:  Local infiltration   Local anesthetic:  Lidocaine 1% w/o epi Laceration details:    Location:  Lip   Lip location:  Upper interior lip   Length (cm):  2 Treatment:    Area cleansed with:  Saline    Amount of cleaning:  Extensive   Irrigation solution:  Sterile saline   Irrigation method:  Syringe Skin repair:    Repair method:  Sutures   Suture size:  5-0   Suture material:  Fast-absorbing gut   Suture technique:  Simple interrupted   Number of sutures:  2 Approximation:    Approximation:  Close   Vermilion border well-aligned: laceration did not cross vermillion border.   Repair type:    Repair type:  Simple Post-procedure details:    Dressing:  Open (no dressing)   Procedure completion:  Tolerated well, no immediate complications   Medications Ordered in ED Medications  lidocaine (PF) (XYLOCAINE) 1 % injection 10 mL (10 mLs Infiltration Given by Other 12/06/20 1644)  acetaminophen (TYLENOL) tablet 650 mg (650 mg Oral Given 12/06/20 1916)    ED Course  I have reviewed the triage vital signs and the nursing notes.  Pertinent labs & imaging results that were available during my care of the patient were reviewed by me and considered in my medical decision making (see chart for details).    MDM Rules/Calculators/A&P                           87 year old lady presenting to ER with concern for fall, lip laceration and finger injury.  On exam patient appeared well in no distress.  Noted obvious deformity to her right index finger as well as laceration to her upper lip.  Patient denied any LOC and family reports that it was witnessed.  During the second fall, family member reported that patient seemed to go suddenly limp but was not sure about LOC.  Given the repeat fall and question of LOC, recommended checking basic lab work, head CT, chest x-ray.  Difficulty with obtaining labs, patient requested no additional IV sticks.  Discussed the benefits of obtaining basic blood work but patient again declined.  She is alert and  able to make these decisions.  Will defer any labs at present.  Patient had dislocated her right index finger.  Performed close reduction, placed in finger splint.   Recommend follow-up with her hand specialist.  Has seen Kuzma previously.  Regarding the lip laceration, performed laceration repair due to size of the laceration.  Did not cross vermilion border.  Recommended follow-up with primary doctor for recheck of her lip laceration and to discuss these frequent falls.  Reviewed return precautions and discharged home with daughter.    After the discussed management above, the patient was determined to be safe for discharge.  The patient was in agreement with this plan and all questions regarding their care were answered.  ED return precautions were discussed and the patient will return to the ED with any significant worsening of condition.   Final Clinical Impression(s) / ED Diagnoses Final diagnoses:  Dislocation of finger, initial encounter  Lip laceration, initial encounter    Rx / DC Orders ED Discharge Orders     None        Milagros Loll, MD 12/06/20 2351

## 2020-12-06 NOTE — ED Notes (Signed)
Pt. Requests no further lab sticks. Dr. Stevie Kern is made aware and is ok with this.

## 2020-12-06 NOTE — ED Triage Notes (Signed)
Patient reports to the ER for fall. Patient reports she fell outside. Patient has an obvious deformity to the right hand. Patient has obvious swelling and bleeding from the mouth. Denies blood thinners. Patient is unsure if she passed out when she fall. Patient also had a fall last night and does not remember falling.

## 2020-12-06 NOTE — Discharge Instructions (Addendum)
For the next week, recommend only eating soft foods.  Please follow-up with your primary doctor regarding your episodes of falls.  For your finger injury, keep splint intact and follow-up with Dr. Merlyn Lot.  Come back to ER if you have any additional falls, any episodes of lightheadedness, passing out or other new concerning symptom.

## 2020-12-11 ENCOUNTER — Ambulatory Visit: Payer: Medicare Other

## 2020-12-14 ENCOUNTER — Ambulatory Visit: Payer: Medicare Other | Admitting: Family Medicine

## 2020-12-15 DIAGNOSIS — S63290A Dislocation of distal interphalangeal joint of right index finger, initial encounter: Secondary | ICD-10-CM | POA: Diagnosis not present

## 2020-12-15 DIAGNOSIS — M79641 Pain in right hand: Secondary | ICD-10-CM | POA: Diagnosis not present

## 2020-12-17 ENCOUNTER — Other Ambulatory Visit: Payer: Self-pay

## 2020-12-18 ENCOUNTER — Encounter: Payer: Self-pay | Admitting: Family Medicine

## 2020-12-18 ENCOUNTER — Ambulatory Visit (INDEPENDENT_AMBULATORY_CARE_PROVIDER_SITE_OTHER): Payer: Medicare Other | Admitting: Family Medicine

## 2020-12-18 VITALS — BP 130/88 | HR 75 | Temp 97.8°F

## 2020-12-18 DIAGNOSIS — E782 Mixed hyperlipidemia: Secondary | ICD-10-CM

## 2020-12-18 DIAGNOSIS — R296 Repeated falls: Secondary | ICD-10-CM | POA: Diagnosis not present

## 2020-12-18 DIAGNOSIS — I10 Essential (primary) hypertension: Secondary | ICD-10-CM | POA: Diagnosis not present

## 2020-12-18 DIAGNOSIS — Z9181 History of falling: Secondary | ICD-10-CM | POA: Diagnosis not present

## 2020-12-18 DIAGNOSIS — S63259D Unspecified dislocation of unspecified finger, subsequent encounter: Secondary | ICD-10-CM

## 2020-12-18 LAB — COMPREHENSIVE METABOLIC PANEL
ALT: 9 U/L (ref 0–35)
AST: 20 U/L (ref 0–37)
Albumin: 4 g/dL (ref 3.5–5.2)
Alkaline Phosphatase: 48 U/L (ref 39–117)
BUN: 12 mg/dL (ref 6–23)
CO2: 29 mEq/L (ref 19–32)
Calcium: 9.7 mg/dL (ref 8.4–10.5)
Chloride: 100 mEq/L (ref 96–112)
Creatinine, Ser: 0.97 mg/dL (ref 0.40–1.20)
GFR: 53.18 mL/min — ABNORMAL LOW (ref >60.00)
Glucose, Bld: 82 mg/dL (ref 70–99)
Potassium: 3.9 mEq/L (ref 3.5–5.1)
Sodium: 138 mEq/L (ref 135–145)
Total Bilirubin: 0.4 mg/dL (ref 0.2–1.2)
Total Protein: 6.5 g/dL (ref 6.0–8.3)

## 2020-12-18 LAB — CBC WITH DIFFERENTIAL/PLATELET
Basophils Absolute: 0.1 10*3/uL (ref 0.0–0.1)
Basophils Relative: 1.8 % (ref 0.0–3.0)
Eosinophils Absolute: 0.1 10*3/uL (ref 0.0–0.7)
Eosinophils Relative: 1.5 % (ref 0.0–5.0)
HCT: 36 % (ref 36.0–46.0)
Hemoglobin: 11.8 g/dL — ABNORMAL LOW (ref 12.0–15.0)
Lymphocytes Relative: 35.5 % (ref 12.0–46.0)
Lymphs Abs: 1.8 10*3/uL (ref 0.7–4.0)
MCHC: 32.7 g/dL (ref 30.0–36.0)
MCV: 93.7 fl (ref 78.0–100.0)
Monocytes Absolute: 0.5 10*3/uL (ref 0.1–1.0)
Monocytes Relative: 8.9 % (ref 3.0–12.0)
Neutro Abs: 2.6 10*3/uL (ref 1.4–7.7)
Neutrophils Relative %: 52.3 % (ref 43.0–77.0)
Platelets: 249 10*3/uL (ref 150.0–400.0)
RBC: 3.84 Mil/uL — ABNORMAL LOW (ref 3.87–5.11)
RDW: 14.5 % (ref 11.5–15.5)
WBC: 5.1 10*3/uL (ref 4.0–10.5)

## 2020-12-18 LAB — LIPID PANEL
Cholesterol: 209 mg/dL — ABNORMAL HIGH (ref 0–200)
HDL: 63.7 mg/dL (ref >39.00)
LDL Cholesterol: 133 mg/dL — ABNORMAL HIGH (ref 0–99)
NonHDL: 144.88
Total CHOL/HDL Ratio: 3
Triglycerides: 61 mg/dL (ref 0.0–149.0)
VLDL: 12.2 mg/dL (ref 0.0–40.0)

## 2020-12-18 LAB — TSH: TSH: 2.44 u[IU]/mL (ref 0.35–5.50)

## 2020-12-18 LAB — T4, FREE: Free T4: 1.02 ng/dL (ref 0.60–1.60)

## 2020-12-18 LAB — HEMOGLOBIN A1C: Hgb A1c MFr Bld: 5.9 % (ref 4.6–6.5)

## 2020-12-18 NOTE — Progress Notes (Signed)
Subjective:    Patient ID: Felicia Cross, female    DOB: 06/29/1932, 87 y.o.   MRN: [id number]  Chief Complaint  Patient presents with   Fall    Recently fell twice, stitches in top lip and dislocated finger   Pt accompanied by family member.  Several others on Facetime from all over the country.  HPI Patient was seen today for ongoing concern.  Pt last seen 03/2020.  Per pt and family members, pt has been falling more.  Seen in ED 7/31 after falling which dislocated her finger.  Not sure how she fell.  No LOC.  Pt also required stitches in top lip. At home pt does not always use assistive device to aid with ambulation as does not want to slow down.  Pt not drinking much water daily.  Currently going to outpatient rehab for OT and PT.    Past Medical History:  Diagnosis Date   Anemia 01/18/2016   -reports on and off her whole life, on oral iron in the past remotely per report   Arthritis    B12 deficiency 01/18/2016   CAD (coronary artery disease)    CVD (cerebrovascular disease)    Diverticulosis of colon    H. pylori infection 10/20/2015   Hyperlipidemia    Hypertension    Osteoarthritis 03/07/2006   Qualifier: Diagnosis of  By: Cato Mulligan MD, Bruce      Allergies  Allergen Reactions   Codeine Nausea And Vomiting   Hydrocodone Nausea And Vomiting    ROS General: Denies fever, chills, night sweats, changes in weight, changes in appetite  +falls HEENT: Denies headaches, ear pain, changes in vision, rhinorrhea, sore throat CV: Denies CP, palpitations, SOB, orthopnea Pulm: Denies SOB, cough, wheezing GI: Denies abdominal pain, nausea, vomiting, diarrhea, constipation GU: Denies dysuria, hematuria, frequency, vaginal discharge Msk: Denies muscle cramps, joint pains  + finger dislocation Neuro: Denies weakness, numbness, tingling Skin: Denies rashes, bruising Psych: Denies depression, anxiety, hallucinations    Objective:    Blood pressure 130/88, pulse 75, temperature 97.8 F  (36.6 C), temperature source Oral, SpO2 96 %.  Gen. Pleasant, well-nourished, in no distress, normal affect   HEENT: [place]/AT, face symmetric, conjunctiva clear, no scleral icterus, PERRLA, EOMI, nares patent without drainage, TMs normal b/l. Neck: No JVD, no thyromegaly, no carotid bruits Lungs: no accessory muscle use, CTAB, no wheezes or rales Cardiovascular: RRR, no m/r/g, no peripheral edema Abdomen: BS present, soft, NT/ND. Musculoskeletal: No deformities, no cyanosis or clubbing, normal tone Neuro:  A&Ox3, CN II-XII intact, ambulating with walker Skin:  Warm, no lesions/ rash   Wt Readings from Last 3 Encounters:  08/20/20 190 lb (86.2 kg)  03/13/20 188 lb (85.3 kg)  12/04/19 189 lb 3.2 oz (85.8 kg)    Lab Results  Component Value Date   WBC 5.6 10/21/2019   HGB 12.1 10/21/2019   HCT 36.4 10/21/2019   PLT 279.0 10/21/2019   GLUCOSE 89 10/21/2019   CHOL 156 10/21/2019   TRIG 63.0 10/21/2019   HDL 65.00 10/21/2019   LDLDIRECT 138.1 05/19/2011   LDLCALC 79 10/21/2019   ALT 16 11/05/2018   AST 26 11/05/2018   NA 139 10/21/2019   K 4.0 10/21/2019   CL 101 10/21/2019   CREATININE 1.11 10/21/2019   BUN 17 10/21/2019   CO2 31 10/21/2019   TSH 2.25 10/15/2015   INR 1.04 09/04/2015   HGBA1C 6.0 10/21/2019    Assessment/Plan:  Essential hypertension  -controlled -continue losartan 100 mg  dialy, HCTZ 25 mg, clonidine .1 mg BID, metoprolol tartrate 25 mg BID -increase po intake of water and fluids -add potassium supplement if needed based on labs - Plan: CMP  Frequent falls  -Pt encouraged to use rolling walker when ambulating.  Take extra time when moving about home.  Remove any rugs or cords that pt could trip over.  Increase po intake of water and fluids. -continue PT and OT -orthostatic bps -will obtain labs.  Further recommendations based on results. - Plan: Culture, Urine, CBC with Differential/Platelet, TSH, T4, Free, Hemoglobin A1c, CMP  Dislocation of  finger, subsequent encounter  -continue wearing splint and other supportive care -continue f/u with Ortho  Mixed hyperlipidemia  -lifestyle modifications - Plan: Lipid panel  F/u in 1 month, sooner if needed  More than 50% of over 39 minutes spent in total in caring for this patient face to face, on chart review, answering family member questions, counseling and/or coordinating care.   Abbe Amsterdam, MD

## 2020-12-19 NOTE — Progress Notes (Deleted)
    HPI: FU hypertension, hyperlipidemia and minimal CAD. She had a previous catheterization in August 2006 that showed minor disease in the diagonal and distal LAD. We also performed carotid Dopplers in Feb 2012 that showed 0-39% stenosis. F/U not deemed necessary as this had been stable over serial exams. Stress echo 4/15 normal.  Since last seen,  Current Outpatient Medications  Medication Sig Dispense Refill   aspirin EC 81 MG tablet Take 1 tablet (81 mg total) by mouth daily.     cloNIDine (CATAPRES) 0.1 MG tablet Take 0.1 mg by mouth 2 (two) times daily.     cyanocobalamin 500 MCG tablet Take 500 mcg by mouth daily.     dimenhyDRINATE (DRAMAMINE PO) Take by mouth. As needed for vertigo     gabapentin (NEURONTIN) 100 MG capsule Take 1 capsule by mouth at bedtime 90 capsule 0   hydrochlorothiazide (HYDRODIURIL) 25 MG tablet Take 1 tablet by mouth once daily 90 tablet 0   losartan (COZAAR) 100 MG tablet Take 1 tablet by mouth once daily 90 tablet 1   meclizine (ANTIVERT) 25 MG tablet meclizine 25 mg tablet  TAKE 1 TABLET BY MOUTH THREE TIMES A DAY AS NEEDED DAILY     metoprolol tartrate (LOPRESSOR) 25 MG tablet Take 1 tablet (25 mg total) by mouth 2 (two) times daily. 180 tablet 3   Multiple Vitamin (MULTIVITAMIN WITH MINERALS) TABS Take 1 tablet by mouth daily.      simvastatin (ZOCOR) 40 MG tablet Take 1 tablet by mouth once daily 46 tablet 3   No current facility-administered medications for this visit.     Past Medical History:  Diagnosis Date   Anemia 01/18/2016   -reports on and off her whole life, on oral iron in the past remotely per report   Arthritis    B12 deficiency 01/18/2016   CAD (coronary artery disease)    CVD (cerebrovascular disease)    Diverticulosis of colon    H. pylori infection 10/20/2015   Hyperlipidemia    Hypertension    Osteoarthritis 03/07/2006   Qualifier: Diagnosis of  By: Swords MD, Bruce      Past Surgical History:  Procedure Laterality  Date   ABDOMINAL HYSTERECTOMY  1984   REPLACEMENT TOTAL KNEE  2007   left   TOTAL HIP ARTHROPLASTY  2010   Dr. Rowan, right   TOTAL KNEE ARTHROPLASTY Right 09/14/2015   Procedure: TOTAL KNEE ARTHROPLASTY;  Surgeon: Steve Lucey, MD;  Location: [facility] OR;  Service: Orthopedics;  Laterality: Right;    Social History   Socioeconomic History   Marital status: Married    Spouse name: Not on file   Number of children: Not on file   Years of education: Not on file   Highest education level: Not on file  Occupational History   Not on file  Tobacco Use   Smoking status: Former    Packs/day: 1.00    Years: 30.00    Pack years: 30.00    Types: Cigarettes    Quit date: 11/01/1980    Years since quitting: 40.1   Smokeless tobacco: Never  Substance and Sexual Activity   Alcohol use: Yes    Alcohol/week: 0.0 standard drinks    Comment: glass of wine   Drug use: No   Sexual activity: Not on file  Other Topics Concern   Not on file  Social History Narrative   Updated 01/07/15   Work or School: retired from [employer], volunteers at church -   [employer] 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, Information systems manager and two great grandchildren age 56 and 44      Spiritual Beliefs: Christian      Lifestyle: regular exercise at the [employer]; healthy diet      Social Determinants of Health   Financial Resource Strain: Not on file  Food Insecurity: Not on file  Transportation Needs: Not on file  Physical Activity: Not on file  Stress: Not on file  Social Connections: Not on file  Intimate Partner Violence: Not on file    Family History  Problem Relation Age of Onset   Diabetes Mother    Hypertension Father    Kidney disease Father    Heart attack Sister    Heart attack Brother    Colon cancer Neg Hx    Stomach cancer Neg Hx    Esophageal cancer Neg Hx    Rectal cancer Neg Hx     ROS: no fevers or chills, productive cough, hemoptysis, dysphasia, odynophagia, melena, hematochezia, dysuria,  hematuria, rash, seizure activity, orthopnea, PND, pedal edema, claudication. Remaining systems are negative.  Physical Exam: Well-developed well-nourished in no acute distress.  Skin is warm and dry.  HEENT is normal.  Neck is supple.  Chest is clear to auscultation with normal expansion.  Cardiovascular exam is regular rate and rhythm.  Abdominal exam nontender or distended. No masses palpated. Extremities show no edema. neuro grossly intact  ECG- personally reviewed  A/P  1 coronary artery disease-nonobstructive.  Continue medical therapy.  Continue aspirin and statin.  2 hypertension-blood pressure controlled.  Continue present medical regimen.  3 hyperlipidemia-continue statin.  4 carotid artery disease-mild on most recent Dopplers.  Olga Millers, MD

## 2020-12-20 LAB — URINE CULTURE
MICRO NUMBER:: 12236801
SPECIMEN QUALITY:: ADEQUATE

## 2020-12-21 ENCOUNTER — Ambulatory Visit: Payer: Medicare Other | Admitting: Family Medicine

## 2020-12-29 ENCOUNTER — Ambulatory Visit: Payer: Medicare Other | Admitting: Cardiology

## 2020-12-29 DIAGNOSIS — R296 Repeated falls: Secondary | ICD-10-CM | POA: Insufficient documentation

## 2020-12-29 DIAGNOSIS — E782 Mixed hyperlipidemia: Secondary | ICD-10-CM | POA: Insufficient documentation

## 2020-12-29 NOTE — Progress Notes (Deleted)
HPI: FU hypertension, hyperlipidemia and minimal CAD. She had a previous catheterization in August 2006 that showed minor disease in the diagonal and distal LAD. We also performed carotid Dopplers in Feb 2012 that showed 0-39% stenosis. F/U not deemed necessary as this had been stable over serial exams. Stress echo 4/15 normal.  Since last seen,  Current Outpatient Medications  Medication Sig Dispense Refill   aspirin EC 81 MG tablet Take 1 tablet (81 mg total) by mouth daily.     cloNIDine (CATAPRES) 0.1 MG tablet Take 0.1 mg by mouth 2 (two) times daily.     cyanocobalamin 500 MCG tablet Take 500 mcg by mouth daily.     dimenhyDRINATE (DRAMAMINE PO) Take by mouth. As needed for vertigo     gabapentin (NEURONTIN) 100 MG capsule Take 1 capsule by mouth at bedtime 90 capsule 0   hydrochlorothiazide (HYDRODIURIL) 25 MG tablet Take 1 tablet by mouth once daily 90 tablet 0   losartan (COZAAR) 100 MG tablet Take 1 tablet by mouth once daily 90 tablet 1   meclizine (ANTIVERT) 25 MG tablet meclizine 25 mg tablet  TAKE 1 TABLET BY MOUTH THREE TIMES A DAY AS NEEDED DAILY     metoprolol tartrate (LOPRESSOR) 25 MG tablet Take 1 tablet (25 mg total) by mouth 2 (two) times daily. 180 tablet 3   Multiple Vitamin (MULTIVITAMIN WITH MINERALS) TABS Take 1 tablet by mouth daily.      simvastatin (ZOCOR) 40 MG tablet Take 1 tablet by mouth once daily 46 tablet 3   No current facility-administered medications for this visit.     Past Medical History:  Diagnosis Date   Anemia 01/18/2016   -reports on and off her whole life, on oral iron in the past remotely per report   Arthritis    B12 deficiency 01/18/2016   CAD (coronary artery disease)    CVD (cerebrovascular disease)    Diverticulosis of colon    H. pylori infection 10/20/2015   Hyperlipidemia    Hypertension    Osteoarthritis 03/07/2006   Qualifier: Diagnosis of  By: Cato Mulligan MD, Bruce      Past Surgical History:  Procedure Laterality  Date   ABDOMINAL HYSTERECTOMY  1984   REPLACEMENT TOTAL KNEE  2007   left   TOTAL HIP ARTHROPLASTY  2010   Dr. Turner Daniels, right   TOTAL KNEE ARTHROPLASTY Right 09/14/2015   Procedure: TOTAL KNEE ARTHROPLASTY;  Surgeon: Dannielle Huh, MD;  Location: [facility] OR;  Service: Orthopedics;  Laterality: Right;    Social History   Socioeconomic History   Marital status: Married    Spouse name: Not on file   Number of children: Not on file   Years of education: Not on file   Highest education level: Not on file  Occupational History   Not on file  Tobacco Use   Smoking status: Former    Packs/day: 1.00    Years: 30.00    Pack years: 30.00    Types: Cigarettes    Quit date: 11/01/1980    Years since quitting: 40.1   Smokeless tobacco: Never  Substance and Sexual Activity   Alcohol use: Yes    Alcohol/week: 0.0 standard drinks    Comment: glass of wine   Drug use: No   Sexual activity: Not on file  Other Topics Concern   Not on file  Social History Narrative   Updated 01/07/15   Work or School: retired from [employer], volunteers at [employer] -  [employer] 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, Information systems manager and two great grandchildren age 56 and 44      Spiritual Beliefs: Christian      Lifestyle: regular exercise at the [employer]; healthy diet      Social Determinants of Health   Financial Resource Strain: Not on file  Food Insecurity: Not on file  Transportation Needs: Not on file  Physical Activity: Not on file  Stress: Not on file  Social Connections: Not on file  Intimate Partner Violence: Not on file    Family History  Problem Relation Age of Onset   Diabetes Mother    Hypertension Father    Kidney disease Father    Heart attack Sister    Heart attack Brother    Colon cancer Neg Hx    Stomach cancer Neg Hx    Esophageal cancer Neg Hx    Rectal cancer Neg Hx     ROS: no fevers or chills, productive cough, hemoptysis, dysphasia, odynophagia, melena, hematochezia, dysuria,  hematuria, rash, seizure activity, orthopnea, PND, pedal edema, claudication. Remaining systems are negative.  Physical Exam: Well-developed well-nourished in no acute distress.  Skin is warm and dry.  HEENT is normal.  Neck is supple.  Chest is clear to auscultation with normal expansion.  Cardiovascular exam is regular rate and rhythm.  Abdominal exam nontender or distended. No masses palpated. Extremities show no edema. neuro grossly intact  ECG- personally reviewed  A/P  1 coronary artery disease-nonobstructive.  Continue medical therapy.  Continue aspirin and statin.  2 hypertension-blood pressure controlled.  Continue present medical regimen.  3 hyperlipidemia-continue statin.  4 carotid artery disease-mild on most recent Dopplers.  Olga Millers, MD

## 2020-12-30 ENCOUNTER — Other Ambulatory Visit: Payer: Self-pay | Admitting: Family Medicine

## 2020-12-30 DIAGNOSIS — I10 Essential (primary) hypertension: Secondary | ICD-10-CM

## 2020-12-31 ENCOUNTER — Ambulatory Visit: Payer: Medicare Other | Admitting: Cardiology

## 2021-01-05 DIAGNOSIS — S63639A Sprain of interphalangeal joint of unspecified finger, initial encounter: Secondary | ICD-10-CM | POA: Diagnosis not present

## 2021-01-18 ENCOUNTER — Ambulatory Visit: Payer: Medicare Other | Admitting: Family Medicine

## 2021-01-21 ENCOUNTER — Ambulatory Visit (INDEPENDENT_AMBULATORY_CARE_PROVIDER_SITE_OTHER): Payer: Medicare Other | Admitting: Family Medicine

## 2021-01-21 ENCOUNTER — Encounter: Payer: Self-pay | Admitting: Family Medicine

## 2021-01-21 ENCOUNTER — Other Ambulatory Visit: Payer: Self-pay

## 2021-01-21 VITALS — BP 142/82 | HR 69 | Temp 97.5°F | Wt 150.2 lb

## 2021-01-21 DIAGNOSIS — R296 Repeated falls: Secondary | ICD-10-CM

## 2021-01-21 DIAGNOSIS — Z23 Encounter for immunization: Secondary | ICD-10-CM

## 2021-01-21 DIAGNOSIS — S63259D Unspecified dislocation of unspecified finger, subsequent encounter: Secondary | ICD-10-CM | POA: Diagnosis not present

## 2021-01-21 DIAGNOSIS — R2689 Other abnormalities of gait and mobility: Secondary | ICD-10-CM | POA: Diagnosis not present

## 2021-01-21 DIAGNOSIS — I10 Essential (primary) hypertension: Secondary | ICD-10-CM

## 2021-01-21 DIAGNOSIS — R413 Other amnesia: Secondary | ICD-10-CM

## 2021-01-21 DIAGNOSIS — M25622 Stiffness of left elbow, not elsewhere classified: Secondary | ICD-10-CM

## 2021-01-25 ENCOUNTER — Telehealth: Payer: Self-pay | Admitting: Family Medicine

## 2021-01-25 NOTE — Telephone Encounter (Signed)
Patient wants Steffanie Rainwater to know that the in home health person didn't call yet.  Patient could be contacted at [phone].  Please advise.

## 2021-01-25 NOTE — Progress Notes (Signed)
Subjective:    Patient ID: [name], female    DOB: Jul 21, 1932, 87 y.o.   MRN: [id number]  Chief Complaint  Patient presents with   Follow-up    BP, frequent falls  Pt accompanied by her husband.  HPI Patient was seen today for f/u.  Pt states she has been doing well since last visit.  Pt denies falls, but her husband states she had one.  Pt then remembers she tripped over her feet while in the kitchen.  Pt using walker, but is afraid that she will fall which makes her anxious when moving around.  Pt released by Ortho s/p L finger dislocation.  Pt mentions stiffness s/p L elbow fx and decreased ROM x 1 yr.  Has difficulty lifting  and making a fist with L hand.   Pt had 2 yrs of school post high school. Past Medical History:  Diagnosis Date   Anemia 01/18/2016   -reports on and off her whole life, on oral iron in the past remotely per report   Arthritis    B12 deficiency 01/18/2016   CAD (coronary artery disease)    CVD (cerebrovascular disease)    Diverticulosis of colon    H. pylori infection 10/20/2015   Hyperlipidemia    Hypertension    Osteoarthritis 03/07/2006   Qualifier: Diagnosis of  By: Cato Mulligan MD, Bruce      Allergies  Allergen Reactions   Codeine Nausea And Vomiting   Hydrocodone Nausea And Vomiting    ROS General: Denies fever, chills, night sweats, changes in weight, changes in appetite  HEENT: Denies headaches, ear pain, changes in vision, rhinorrhea, sore throat CV: Denies CP, palpitations, SOB, orthopnea Pulm: Denies SOB, cough, wheezing GI: Denies abdominal pain, nausea, vomiting, diarrhea, constipation GU: Denies dysuria, hematuria, frequency, vaginal discharge Msk: Denies muscle cramps, joint pains  +decreased ROM, stiffness of L elbow, difficulty making a fist with L hand Neuro: Denies weakness, numbness, tingling Skin: Denies rashes, bruising Psych: Denies depression, anxiety, hallucinations    Objective:    Blood pressure (!) 142/82, pulse  69, temperature (!) 97.5 F (36.4 C), temperature source Oral, weight 150 lb 3.2 oz (68.1 kg), SpO2 96 %.  Gen. Pleasant, well-nourished, in no distress, normal affect   HEENT: [place]/AT, face symmetric, conjunctiva clear, no scleral icterus, PERRLA, EOMI, nares patent without drainage Lungs: no accessory muscle use, CTAB, no wheezes or rales Cardiovascular: RRR, no m/r/g, no peripheral edema Musculoskeletal:  L elbow limited to 90 degrees extension. L 4th digit hyperextended.  No cyanosis or clubbing, normal tone Neuro:  A&Ox3, CN II-XII intact, slowed gait with rolling walker. Skin:  Warm, no lesions/ rash Psych: MOCA done this visit score 20/30.    Wt Readings from Last 3 Encounters:  01/21/21 150 lb 3.2 oz (68.1 kg)  08/20/20 190 lb (86.2 kg)  03/13/20 188 lb (85.3 kg)    Lab Results  Component Value Date   WBC 5.1 12/18/2020   HGB 11.8 (L) 12/18/2020   HCT 36.0 12/18/2020   PLT 249.0 12/18/2020   GLUCOSE 82 12/18/2020   CHOL 209 (H) 12/18/2020   TRIG 61.0 12/18/2020   HDL 63.70 12/18/2020   LDLDIRECT 138.1 05/19/2011   LDLCALC 133 (H) 12/18/2020   ALT 9 12/18/2020   AST 20 12/18/2020   NA 138 12/18/2020   K 3.9 12/18/2020   CL 100 12/18/2020   CREATININE 0.97 12/18/2020   BUN 12 12/18/2020   CO2 29 12/18/2020   TSH 2.44 12/18/2020  INR 1.04 09/04/2015   HGBA1C 5.9 12/18/2020    Assessment/Plan:  Memory deficit  -MOCA 20/30.   -referral placed for formal neuropsych testing - Plan: Ambulatory referral to Neuropsychology  Need for influenza vaccination  - Plan: Flu Vaccine QUAD High Dose(Fluad)  Frequent falls -continue using walker -would benefit from PT and OT  - Plan: Ambulatory referral to Home Health  Balance problem  - Plan: Ambulatory referral to Home Health  Decreased range of motion of elbow, left  -Advised unlikely to regain full ROM of L elbow 2/2 duration of sx after fx. -referal for  [facility] PT and OT - Plan: Ambulatory referral to Home  Health  Dislocation of finger, subsequent encounter  -stable -s/p fall. Released from Ortho per pt. - Plan: Ambulatory referral to Home Health  HTN -elevated -discussed the importance of med compliance, diet modifications -continue HCTZ 25 mg, losartan 100 mg, metoprolol tartrate 25 mg BID, clonidine 0.1 mg BID. -check bp at home and keep a log to bring to clinic -for continued elevation consider norvasc.  F/u in 1 month  Abbe Amsterdam, MD

## 2021-01-27 NOTE — Telephone Encounter (Signed)
Spoke with pt, informed her that [facility] is a little behind with scheduling, but the referral has been authorized and someone will be calling her.

## 2021-02-01 DIAGNOSIS — K219 Gastro-esophageal reflux disease without esophagitis: Secondary | ICD-10-CM | POA: Diagnosis not present

## 2021-02-01 DIAGNOSIS — I10 Essential (primary) hypertension: Secondary | ICD-10-CM | POA: Diagnosis not present

## 2021-02-01 DIAGNOSIS — R2681 Unsteadiness on feet: Secondary | ICD-10-CM | POA: Diagnosis not present

## 2021-02-01 DIAGNOSIS — E785 Hyperlipidemia, unspecified: Secondary | ICD-10-CM | POA: Diagnosis not present

## 2021-02-01 DIAGNOSIS — Z9181 History of falling: Secondary | ICD-10-CM | POA: Diagnosis not present

## 2021-02-01 DIAGNOSIS — Z7982 Long term (current) use of aspirin: Secondary | ICD-10-CM | POA: Diagnosis not present

## 2021-02-01 DIAGNOSIS — M25629 Stiffness of unspecified elbow, not elsewhere classified: Secondary | ICD-10-CM | POA: Diagnosis not present

## 2021-02-01 DIAGNOSIS — S63259A Unspecified dislocation of unspecified finger, initial encounter: Secondary | ICD-10-CM | POA: Diagnosis not present

## 2021-02-02 DIAGNOSIS — S63259A Unspecified dislocation of unspecified finger, initial encounter: Secondary | ICD-10-CM | POA: Diagnosis not present

## 2021-02-02 DIAGNOSIS — K219 Gastro-esophageal reflux disease without esophagitis: Secondary | ICD-10-CM | POA: Diagnosis not present

## 2021-02-02 DIAGNOSIS — Z7982 Long term (current) use of aspirin: Secondary | ICD-10-CM | POA: Diagnosis not present

## 2021-02-02 DIAGNOSIS — Z9181 History of falling: Secondary | ICD-10-CM | POA: Diagnosis not present

## 2021-02-02 DIAGNOSIS — I10 Essential (primary) hypertension: Secondary | ICD-10-CM | POA: Diagnosis not present

## 2021-02-02 DIAGNOSIS — M25629 Stiffness of unspecified elbow, not elsewhere classified: Secondary | ICD-10-CM | POA: Diagnosis not present

## 2021-02-02 DIAGNOSIS — E785 Hyperlipidemia, unspecified: Secondary | ICD-10-CM | POA: Diagnosis not present

## 2021-02-02 DIAGNOSIS — R2681 Unsteadiness on feet: Secondary | ICD-10-CM | POA: Diagnosis not present

## 2021-02-04 DIAGNOSIS — R2681 Unsteadiness on feet: Secondary | ICD-10-CM | POA: Diagnosis not present

## 2021-02-04 DIAGNOSIS — I10 Essential (primary) hypertension: Secondary | ICD-10-CM | POA: Diagnosis not present

## 2021-02-04 DIAGNOSIS — Z7982 Long term (current) use of aspirin: Secondary | ICD-10-CM | POA: Diagnosis not present

## 2021-02-04 DIAGNOSIS — E785 Hyperlipidemia, unspecified: Secondary | ICD-10-CM | POA: Diagnosis not present

## 2021-02-04 DIAGNOSIS — Z9181 History of falling: Secondary | ICD-10-CM | POA: Diagnosis not present

## 2021-02-04 DIAGNOSIS — S63259A Unspecified dislocation of unspecified finger, initial encounter: Secondary | ICD-10-CM | POA: Diagnosis not present

## 2021-02-04 DIAGNOSIS — M25629 Stiffness of unspecified elbow, not elsewhere classified: Secondary | ICD-10-CM | POA: Diagnosis not present

## 2021-02-04 DIAGNOSIS — K219 Gastro-esophageal reflux disease without esophagitis: Secondary | ICD-10-CM | POA: Diagnosis not present

## 2021-02-05 DIAGNOSIS — I10 Essential (primary) hypertension: Secondary | ICD-10-CM | POA: Diagnosis not present

## 2021-02-05 DIAGNOSIS — M25629 Stiffness of unspecified elbow, not elsewhere classified: Secondary | ICD-10-CM | POA: Diagnosis not present

## 2021-02-05 DIAGNOSIS — K219 Gastro-esophageal reflux disease without esophagitis: Secondary | ICD-10-CM | POA: Diagnosis not present

## 2021-02-05 DIAGNOSIS — E785 Hyperlipidemia, unspecified: Secondary | ICD-10-CM | POA: Diagnosis not present

## 2021-02-05 DIAGNOSIS — R2681 Unsteadiness on feet: Secondary | ICD-10-CM | POA: Diagnosis not present

## 2021-02-05 DIAGNOSIS — S63259A Unspecified dislocation of unspecified finger, initial encounter: Secondary | ICD-10-CM | POA: Diagnosis not present

## 2021-02-05 DIAGNOSIS — Z9181 History of falling: Secondary | ICD-10-CM | POA: Diagnosis not present

## 2021-02-05 DIAGNOSIS — Z7982 Long term (current) use of aspirin: Secondary | ICD-10-CM | POA: Diagnosis not present

## 2021-02-08 DIAGNOSIS — S63259A Unspecified dislocation of unspecified finger, initial encounter: Secondary | ICD-10-CM | POA: Diagnosis not present

## 2021-02-08 DIAGNOSIS — Z7982 Long term (current) use of aspirin: Secondary | ICD-10-CM | POA: Diagnosis not present

## 2021-02-08 DIAGNOSIS — I10 Essential (primary) hypertension: Secondary | ICD-10-CM | POA: Diagnosis not present

## 2021-02-08 DIAGNOSIS — K219 Gastro-esophageal reflux disease without esophagitis: Secondary | ICD-10-CM | POA: Diagnosis not present

## 2021-02-08 DIAGNOSIS — R2681 Unsteadiness on feet: Secondary | ICD-10-CM | POA: Diagnosis not present

## 2021-02-08 DIAGNOSIS — M25629 Stiffness of unspecified elbow, not elsewhere classified: Secondary | ICD-10-CM | POA: Diagnosis not present

## 2021-02-08 DIAGNOSIS — E785 Hyperlipidemia, unspecified: Secondary | ICD-10-CM | POA: Diagnosis not present

## 2021-02-08 DIAGNOSIS — Z9181 History of falling: Secondary | ICD-10-CM | POA: Diagnosis not present

## 2021-02-11 DIAGNOSIS — S63259A Unspecified dislocation of unspecified finger, initial encounter: Secondary | ICD-10-CM | POA: Diagnosis not present

## 2021-02-11 DIAGNOSIS — Z7982 Long term (current) use of aspirin: Secondary | ICD-10-CM | POA: Diagnosis not present

## 2021-02-11 DIAGNOSIS — I10 Essential (primary) hypertension: Secondary | ICD-10-CM | POA: Diagnosis not present

## 2021-02-11 DIAGNOSIS — M25629 Stiffness of unspecified elbow, not elsewhere classified: Secondary | ICD-10-CM | POA: Diagnosis not present

## 2021-02-11 DIAGNOSIS — K219 Gastro-esophageal reflux disease without esophagitis: Secondary | ICD-10-CM | POA: Diagnosis not present

## 2021-02-11 DIAGNOSIS — E785 Hyperlipidemia, unspecified: Secondary | ICD-10-CM | POA: Diagnosis not present

## 2021-02-11 DIAGNOSIS — R2681 Unsteadiness on feet: Secondary | ICD-10-CM | POA: Diagnosis not present

## 2021-02-11 DIAGNOSIS — Z9181 History of falling: Secondary | ICD-10-CM | POA: Diagnosis not present

## 2021-02-13 NOTE — Progress Notes (Signed)
Cardiology Office Note   Date:  02/15/2021   ID:  Felicia Cross, Felicia Cross 1933-04-16, MRN [id number]  PCP:  Deeann Saint, MD  Cardiologist:  Winfield Rast  CC: Dizziness    History of Present Illness: Felicia Cross is a 87 y.o. female who presents for ongoing assessment and management of hypertension, minimal CAD and HL. She was seen in the ER for falls. She was not losing consciousness. She sustained injuries to her lip and hand during the last fall, causing her to be seen in ED. There was not clear etiology of her frequent falls.   Last seen by Dr. Jens Som on 12/04/2019. At that time she was stable and had no cardiac complaints. She was continued on her current medication regimen.   She continues to have falls preceded by dizziness according to her husband.  She denies dizziness. She has a history of vertigo for the last two years, was prescribed meclozine but has forgotten to take it.  She has not lost consciousness. Has felt "my knees buckle" sometimes.   Past Medical History:  Diagnosis Date   Anemia 01/18/2016   -reports on and off her whole life, on oral iron in the past remotely per report   Arthritis    B12 deficiency 01/18/2016   CAD (coronary artery disease)    CVD (cerebrovascular disease)    Diverticulosis of colon    H. pylori infection 10/20/2015   Hyperlipidemia    Hypertension    Osteoarthritis 03/07/2006   Qualifier: Diagnosis of  By: Cato Mulligan MD, Bruce      Past Surgical History:  Procedure Laterality Date   ABDOMINAL HYSTERECTOMY  1984   REPLACEMENT TOTAL KNEE  2007   left   TOTAL HIP ARTHROPLASTY  2010   Dr. Turner Daniels, right   TOTAL KNEE ARTHROPLASTY Right 09/14/2015   Procedure: TOTAL KNEE ARTHROPLASTY;  Surgeon: Dannielle Huh, MD;  Location: [facility] OR;  Service: Orthopedics;  Laterality: Right;     Current Outpatient Medications  Medication Sig Dispense Refill   aspirin EC 81 MG tablet Take 1 tablet (81 mg total) by mouth daily.     cloNIDine (CATAPRES) 0.1  MG tablet Take 0.1 mg by mouth 2 (two) times daily.     cyanocobalamin 500 MCG tablet Take 500 mcg by mouth daily.     dimenhyDRINATE (DRAMAMINE PO) Take by mouth. As needed for vertigo     gabapentin (NEURONTIN) 100 MG capsule Take 1 capsule by mouth at bedtime 90 capsule 0   hydrochlorothiazide (HYDRODIURIL) 12.5 MG tablet Take 12.5 mg by mouth daily. 1 Tablet Daily     losartan (COZAAR) 100 MG tablet Take 1 tablet by mouth once daily 90 tablet 1   metoprolol tartrate (LOPRESSOR) 25 MG tablet Take 1 tablet (25 mg total) by mouth 2 (two) times daily. 180 tablet 3   Multiple Vitamin (MULTIVITAMIN WITH MINERALS) TABS Take 1 tablet by mouth daily.      simvastatin (ZOCOR) 40 MG tablet Take 1 tablet by mouth once daily 46 tablet 3   meclizine (ANTIVERT) 25 MG tablet Take 1 tablet (25 mg total) by mouth as needed for dizziness. 30 tablet 0   No current facility-administered medications for this visit.    Allergies:   Codeine and Hydrocodone    Social History:  The patient  reports that she quit smoking about 40 years ago. Her smoking use included cigarettes. She has a 30.00 pack-year smoking history. She has never used smokeless tobacco. She reports current alcohol  use. She reports that she does not use drugs.   Family History:  The patient's family history includes Diabetes in her mother; Heart attack in her brother and sister; Hypertension in her father; Kidney disease in her father.    ROS: All other systems are reviewed and negative. Unless otherwise mentioned in H&P    PHYSICAL EXAM: VS:  BP 138/72 (BP Location: Right Arm, Patient Position: Sitting, Cuff Size: Large)   Pulse 79   Ht 5\' 4"  (1.626 m)   Wt 158 lb 9.6 oz (71.9 kg)   SpO2 99%   BMI 27.22 kg/m  , BMI Body mass index is 27.22 kg/m. GEN: Well nourished, well developed, in no acute distress HEENT: normal Neck: no JVD, carotid bruits, or masses Cardiac: RRR; no murmurs, rubs, or gallops,no edema  Respiratory:  Clear to  auscultation bilaterally, normal work of breathing GI: soft, nontender, nondistended, + BS MS: no deformity or atrophy Skin: warm and dry, no rash Neuro:  Strength and sensation are intact Psych: euthymic mood, full affect   EKG:  (Personally reviewed) NSR rate of 79 bpm, no AV blocks or pauses.  Recent Labs: 12/18/2020: ALT 9; BUN 12; Creatinine, Ser 0.97; Hemoglobin 11.8; Platelets 249.0; Potassium 3.9; Sodium 138; TSH 2.44    Lipid Panel    Component Value Date/Time   CHOL 209 (H) 12/18/2020 1042   CHOL 180 11/05/2018 1233   TRIG 61.0 12/18/2020 1042   HDL 63.70 12/18/2020 1042   HDL 72 11/05/2018 1233   CHOLHDL 3 12/18/2020 1042   VLDL 12.2 12/18/2020 1042   LDLCALC 133 (H) 12/18/2020 1042   LDLCALC 95 11/05/2018 1233   LDLDIRECT 138.1 05/19/2011 1055      Wt Readings from Last 3 Encounters:  02/15/21 158 lb 9.6 oz (71.9 kg)  01/21/21 150 lb 3.2 oz (68.1 kg)  08/20/20 190 lb (86.2 kg)      Other studies Reviewed: Stress Echo 08/16/2013 Stress ECG conclusions: There were no stress arrhythmias    or conduction abnormalities. The stress ECG was negative    for ischemia. [facility] scoring: exercise time of 5.32min;    maximum ST deviation of 45mm; no angina; resulting score is    6. This score predicts a low risk of cardiac events.  - Staged echo: There was no echocardiographic evidence for    stress-induced ischemia.   ASSESSMENT AND PLAN:  1.  Hypertension: Has been having frequent falls. I am going to decrease her HCTZ to 12.5 mg daily from 25 mg daily to allow for a little higher BP in this elderly woman. She may need some permissive hypertension, to avoid the dizzy spells and falling.  I want her to take her BP daily at the same time each day.  She states when she takes it, usual readings are in the low 120's systolic.  She is also given a refill on Antivert, but will need to see PCP for further refills if needed.   2. History of Carotid Artery Disease: No bruits noted.  IF symptoms persist will repeat carotid studies.   3. CAD: No symptoms of angina or DOE.  Can do further testing if dizziness persists despite decrease in medication.    Current medicines are reviewed at length with the patient today.  I have spent 25 minutes dedicated to the care of this patient on the date of this encounter to include pre-visit review of records, assessment, management and diagnostic testing,with shared decision making.  Labs/ tests ordered today include:  None  Bettey Mare. Liborio Nixon, ANP, AACC   02/15/2021 1:27 PM    [facility] [address] Office [phone] Fax [phone]  Notice: This dictation was prepared with Dragon dictation along with smaller phrase technology. Any transcriptional errors that result from this process are unintentional and may not be corrected upon review.

## 2021-02-15 ENCOUNTER — Ambulatory Visit (INDEPENDENT_AMBULATORY_CARE_PROVIDER_SITE_OTHER): Payer: Medicare Other | Admitting: Adult Health

## 2021-02-15 ENCOUNTER — Encounter: Payer: Self-pay | Admitting: Adult Health

## 2021-02-15 ENCOUNTER — Other Ambulatory Visit: Payer: Self-pay

## 2021-02-15 VITALS — BP 138/72 | HR 79 | Ht 64.0 in | Wt 158.6 lb

## 2021-02-15 DIAGNOSIS — R42 Dizziness and giddiness: Secondary | ICD-10-CM

## 2021-02-15 DIAGNOSIS — E785 Hyperlipidemia, unspecified: Secondary | ICD-10-CM | POA: Diagnosis not present

## 2021-02-15 DIAGNOSIS — Z9181 History of falling: Secondary | ICD-10-CM | POA: Diagnosis not present

## 2021-02-15 DIAGNOSIS — S63259A Unspecified dislocation of unspecified finger, initial encounter: Secondary | ICD-10-CM | POA: Diagnosis not present

## 2021-02-15 DIAGNOSIS — I10 Essential (primary) hypertension: Secondary | ICD-10-CM | POA: Diagnosis not present

## 2021-02-15 DIAGNOSIS — M25629 Stiffness of unspecified elbow, not elsewhere classified: Secondary | ICD-10-CM | POA: Diagnosis not present

## 2021-02-15 DIAGNOSIS — I2510 Atherosclerotic heart disease of native coronary artery without angina pectoris: Secondary | ICD-10-CM

## 2021-02-15 DIAGNOSIS — I779 Disorder of arteries and arterioles, unspecified: Secondary | ICD-10-CM

## 2021-02-15 DIAGNOSIS — Z7982 Long term (current) use of aspirin: Secondary | ICD-10-CM | POA: Diagnosis not present

## 2021-02-15 DIAGNOSIS — R2681 Unsteadiness on feet: Secondary | ICD-10-CM | POA: Diagnosis not present

## 2021-02-15 DIAGNOSIS — K219 Gastro-esophageal reflux disease without esophagitis: Secondary | ICD-10-CM | POA: Diagnosis not present

## 2021-02-15 MED ORDER — MECLIZINE HCL 25 MG PO TABS: 25.0000 mg | ORAL_TABLET | ORAL | 0 refills | Status: DC | PRN

## 2021-02-15 NOTE — Patient Instructions (Signed)
Medication Instructions:  Decrease Hydrochlorothiazide 25 mg to 12.5 mg Daily. *If you need a refill on your cardiac medications before your next appointment, please call your pharmacy*   Lab Work: No Labs  If you have labs (blood work) drawn today and your tests are completely normal, you will receive your results only by: MyChart Message (if you have MyChart) OR A paper copy in the mail If you have any lab test that is abnormal or we need to change your treatment, we will call you to review the results.   Testing/Procedures: No Testing   Follow-Up: At [facility], you and your health needs are our priority.  As part of our continuing mission to provide you with exceptional heart care, we have created designated Provider Care Teams.  These Care Teams include your primary Cardiologist (physician) and Advanced Practice Providers (APPs -  Physician Assistants and Nurse Practitioners) who all work together to provide you with the care you need, when you need it.  We recommend signing up for the patient portal called "MyChart".  Sign up information is provided on this After Visit Summary.  MyChart is used to connect with patients for Virtual Visits (Telemedicine).  Patients are able to view lab/test results, encounter notes, upcoming appointments, etc.  Non-urgent messages can be sent to your provider as well.   To learn more about what you can do with MyChart, go to [email or web address].    Your next appointment:   1 month(s)  The format for your next appointment:   In Person  Provider:   Joni Reining, NP

## 2021-02-16 DIAGNOSIS — E785 Hyperlipidemia, unspecified: Secondary | ICD-10-CM | POA: Diagnosis not present

## 2021-02-16 DIAGNOSIS — Z7982 Long term (current) use of aspirin: Secondary | ICD-10-CM | POA: Diagnosis not present

## 2021-02-16 DIAGNOSIS — S63259A Unspecified dislocation of unspecified finger, initial encounter: Secondary | ICD-10-CM | POA: Diagnosis not present

## 2021-02-16 DIAGNOSIS — R2681 Unsteadiness on feet: Secondary | ICD-10-CM | POA: Diagnosis not present

## 2021-02-16 DIAGNOSIS — M25629 Stiffness of unspecified elbow, not elsewhere classified: Secondary | ICD-10-CM | POA: Diagnosis not present

## 2021-02-16 DIAGNOSIS — I10 Essential (primary) hypertension: Secondary | ICD-10-CM | POA: Diagnosis not present

## 2021-02-16 DIAGNOSIS — K219 Gastro-esophageal reflux disease without esophagitis: Secondary | ICD-10-CM | POA: Diagnosis not present

## 2021-02-16 DIAGNOSIS — Z9181 History of falling: Secondary | ICD-10-CM | POA: Diagnosis not present

## 2021-02-18 ENCOUNTER — Telehealth: Payer: Self-pay

## 2021-02-18 NOTE — Telephone Encounter (Signed)
Husband states patient picked up medication from pharmacy yesterday.

## 2021-02-19 DIAGNOSIS — R2681 Unsteadiness on feet: Secondary | ICD-10-CM | POA: Diagnosis not present

## 2021-02-19 DIAGNOSIS — I10 Essential (primary) hypertension: Secondary | ICD-10-CM | POA: Diagnosis not present

## 2021-02-19 DIAGNOSIS — K219 Gastro-esophageal reflux disease without esophagitis: Secondary | ICD-10-CM | POA: Diagnosis not present

## 2021-02-19 DIAGNOSIS — Z7982 Long term (current) use of aspirin: Secondary | ICD-10-CM | POA: Diagnosis not present

## 2021-02-19 DIAGNOSIS — M25629 Stiffness of unspecified elbow, not elsewhere classified: Secondary | ICD-10-CM | POA: Diagnosis not present

## 2021-02-19 DIAGNOSIS — S63259A Unspecified dislocation of unspecified finger, initial encounter: Secondary | ICD-10-CM | POA: Diagnosis not present

## 2021-02-19 DIAGNOSIS — E785 Hyperlipidemia, unspecified: Secondary | ICD-10-CM | POA: Diagnosis not present

## 2021-02-19 DIAGNOSIS — Z9181 History of falling: Secondary | ICD-10-CM | POA: Diagnosis not present

## 2021-02-21 ENCOUNTER — Other Ambulatory Visit: Payer: Self-pay | Admitting: Family Medicine

## 2021-02-22 DIAGNOSIS — R2681 Unsteadiness on feet: Secondary | ICD-10-CM | POA: Diagnosis not present

## 2021-02-22 DIAGNOSIS — Z9181 History of falling: Secondary | ICD-10-CM | POA: Diagnosis not present

## 2021-02-22 DIAGNOSIS — S63259A Unspecified dislocation of unspecified finger, initial encounter: Secondary | ICD-10-CM | POA: Diagnosis not present

## 2021-02-22 DIAGNOSIS — Z7982 Long term (current) use of aspirin: Secondary | ICD-10-CM | POA: Diagnosis not present

## 2021-02-22 DIAGNOSIS — M25629 Stiffness of unspecified elbow, not elsewhere classified: Secondary | ICD-10-CM | POA: Diagnosis not present

## 2021-02-22 DIAGNOSIS — I10 Essential (primary) hypertension: Secondary | ICD-10-CM | POA: Diagnosis not present

## 2021-02-22 DIAGNOSIS — E785 Hyperlipidemia, unspecified: Secondary | ICD-10-CM | POA: Diagnosis not present

## 2021-02-22 DIAGNOSIS — K219 Gastro-esophageal reflux disease without esophagitis: Secondary | ICD-10-CM | POA: Diagnosis not present

## 2021-02-24 ENCOUNTER — Other Ambulatory Visit: Payer: Self-pay | Admitting: Family Medicine

## 2021-02-24 DIAGNOSIS — I10 Essential (primary) hypertension: Secondary | ICD-10-CM | POA: Diagnosis not present

## 2021-02-24 DIAGNOSIS — Z9181 History of falling: Secondary | ICD-10-CM | POA: Diagnosis not present

## 2021-02-24 DIAGNOSIS — Z7982 Long term (current) use of aspirin: Secondary | ICD-10-CM | POA: Diagnosis not present

## 2021-02-24 DIAGNOSIS — E785 Hyperlipidemia, unspecified: Secondary | ICD-10-CM | POA: Diagnosis not present

## 2021-02-24 DIAGNOSIS — K219 Gastro-esophageal reflux disease without esophagitis: Secondary | ICD-10-CM | POA: Diagnosis not present

## 2021-02-24 DIAGNOSIS — S63259A Unspecified dislocation of unspecified finger, initial encounter: Secondary | ICD-10-CM | POA: Diagnosis not present

## 2021-02-24 DIAGNOSIS — R2681 Unsteadiness on feet: Secondary | ICD-10-CM | POA: Diagnosis not present

## 2021-02-24 DIAGNOSIS — M25629 Stiffness of unspecified elbow, not elsewhere classified: Secondary | ICD-10-CM | POA: Diagnosis not present

## 2021-02-25 DIAGNOSIS — K219 Gastro-esophageal reflux disease without esophagitis: Secondary | ICD-10-CM | POA: Diagnosis not present

## 2021-02-25 DIAGNOSIS — Z7982 Long term (current) use of aspirin: Secondary | ICD-10-CM | POA: Diagnosis not present

## 2021-02-25 DIAGNOSIS — M25629 Stiffness of unspecified elbow, not elsewhere classified: Secondary | ICD-10-CM | POA: Diagnosis not present

## 2021-02-25 DIAGNOSIS — S63259A Unspecified dislocation of unspecified finger, initial encounter: Secondary | ICD-10-CM | POA: Diagnosis not present

## 2021-02-25 DIAGNOSIS — R2681 Unsteadiness on feet: Secondary | ICD-10-CM | POA: Diagnosis not present

## 2021-02-25 DIAGNOSIS — I10 Essential (primary) hypertension: Secondary | ICD-10-CM | POA: Diagnosis not present

## 2021-02-25 DIAGNOSIS — E785 Hyperlipidemia, unspecified: Secondary | ICD-10-CM | POA: Diagnosis not present

## 2021-02-25 DIAGNOSIS — Z9181 History of falling: Secondary | ICD-10-CM | POA: Diagnosis not present

## 2021-02-26 DIAGNOSIS — R2681 Unsteadiness on feet: Secondary | ICD-10-CM | POA: Diagnosis not present

## 2021-02-26 DIAGNOSIS — M25629 Stiffness of unspecified elbow, not elsewhere classified: Secondary | ICD-10-CM | POA: Diagnosis not present

## 2021-02-26 DIAGNOSIS — S63259A Unspecified dislocation of unspecified finger, initial encounter: Secondary | ICD-10-CM | POA: Diagnosis not present

## 2021-02-26 DIAGNOSIS — I10 Essential (primary) hypertension: Secondary | ICD-10-CM | POA: Diagnosis not present

## 2021-02-26 DIAGNOSIS — Z7982 Long term (current) use of aspirin: Secondary | ICD-10-CM | POA: Diagnosis not present

## 2021-02-26 DIAGNOSIS — K219 Gastro-esophageal reflux disease without esophagitis: Secondary | ICD-10-CM | POA: Diagnosis not present

## 2021-02-26 DIAGNOSIS — E785 Hyperlipidemia, unspecified: Secondary | ICD-10-CM | POA: Diagnosis not present

## 2021-02-26 DIAGNOSIS — Z9181 History of falling: Secondary | ICD-10-CM | POA: Diagnosis not present

## 2021-03-01 DIAGNOSIS — R2681 Unsteadiness on feet: Secondary | ICD-10-CM | POA: Diagnosis not present

## 2021-03-01 DIAGNOSIS — I10 Essential (primary) hypertension: Secondary | ICD-10-CM | POA: Diagnosis not present

## 2021-03-01 DIAGNOSIS — Z7982 Long term (current) use of aspirin: Secondary | ICD-10-CM | POA: Diagnosis not present

## 2021-03-01 DIAGNOSIS — M25629 Stiffness of unspecified elbow, not elsewhere classified: Secondary | ICD-10-CM | POA: Diagnosis not present

## 2021-03-01 DIAGNOSIS — S63259A Unspecified dislocation of unspecified finger, initial encounter: Secondary | ICD-10-CM | POA: Diagnosis not present

## 2021-03-01 DIAGNOSIS — Z9181 History of falling: Secondary | ICD-10-CM | POA: Diagnosis not present

## 2021-03-01 DIAGNOSIS — E785 Hyperlipidemia, unspecified: Secondary | ICD-10-CM | POA: Diagnosis not present

## 2021-03-01 DIAGNOSIS — K219 Gastro-esophageal reflux disease without esophagitis: Secondary | ICD-10-CM | POA: Diagnosis not present

## 2021-03-02 DIAGNOSIS — Z9181 History of falling: Secondary | ICD-10-CM | POA: Diagnosis not present

## 2021-03-02 DIAGNOSIS — S63259A Unspecified dislocation of unspecified finger, initial encounter: Secondary | ICD-10-CM | POA: Diagnosis not present

## 2021-03-02 DIAGNOSIS — M25629 Stiffness of unspecified elbow, not elsewhere classified: Secondary | ICD-10-CM | POA: Diagnosis not present

## 2021-03-02 DIAGNOSIS — E785 Hyperlipidemia, unspecified: Secondary | ICD-10-CM | POA: Diagnosis not present

## 2021-03-02 DIAGNOSIS — R2681 Unsteadiness on feet: Secondary | ICD-10-CM | POA: Diagnosis not present

## 2021-03-02 DIAGNOSIS — I10 Essential (primary) hypertension: Secondary | ICD-10-CM | POA: Diagnosis not present

## 2021-03-02 DIAGNOSIS — Z7982 Long term (current) use of aspirin: Secondary | ICD-10-CM | POA: Diagnosis not present

## 2021-03-02 DIAGNOSIS — K219 Gastro-esophageal reflux disease without esophagitis: Secondary | ICD-10-CM | POA: Diagnosis not present

## 2021-03-08 DIAGNOSIS — Z7982 Long term (current) use of aspirin: Secondary | ICD-10-CM | POA: Diagnosis not present

## 2021-03-08 DIAGNOSIS — Z9181 History of falling: Secondary | ICD-10-CM | POA: Diagnosis not present

## 2021-03-08 DIAGNOSIS — E785 Hyperlipidemia, unspecified: Secondary | ICD-10-CM | POA: Diagnosis not present

## 2021-03-08 DIAGNOSIS — I10 Essential (primary) hypertension: Secondary | ICD-10-CM | POA: Diagnosis not present

## 2021-03-08 DIAGNOSIS — K219 Gastro-esophageal reflux disease without esophagitis: Secondary | ICD-10-CM | POA: Diagnosis not present

## 2021-03-08 DIAGNOSIS — M25629 Stiffness of unspecified elbow, not elsewhere classified: Secondary | ICD-10-CM | POA: Diagnosis not present

## 2021-03-08 DIAGNOSIS — S63259A Unspecified dislocation of unspecified finger, initial encounter: Secondary | ICD-10-CM | POA: Diagnosis not present

## 2021-03-08 DIAGNOSIS — R2681 Unsteadiness on feet: Secondary | ICD-10-CM | POA: Diagnosis not present

## 2021-03-12 ENCOUNTER — Ambulatory Visit: Payer: Medicare Other | Admitting: Podiatry

## 2021-03-12 ENCOUNTER — Encounter: Payer: Self-pay | Admitting: Podiatry

## 2021-03-12 ENCOUNTER — Other Ambulatory Visit: Payer: Self-pay

## 2021-03-12 DIAGNOSIS — B351 Tinea unguium: Secondary | ICD-10-CM | POA: Diagnosis not present

## 2021-03-12 DIAGNOSIS — M79675 Pain in left toe(s): Secondary | ICD-10-CM

## 2021-03-12 DIAGNOSIS — M7751 Other enthesopathy of right foot: Secondary | ICD-10-CM | POA: Diagnosis not present

## 2021-03-12 DIAGNOSIS — M79674 Pain in right toe(s): Secondary | ICD-10-CM

## 2021-03-16 ENCOUNTER — Telehealth: Payer: Self-pay

## 2021-03-16 NOTE — Telephone Encounter (Signed)
Patient is asking for a referral to Neurologists patient has appt scheduled 11/16

## 2021-03-17 ENCOUNTER — Encounter: Payer: Self-pay | Admitting: Podiatry

## 2021-03-17 NOTE — Progress Notes (Signed)
  Subjective:  Patient ID: Felicia Cross, female    DOB: 1932/11/29,  MRN: [id number]  Chief Complaint  Patient presents with   Nail Problem    NAIL TRIM    87 y.o. female returns for the above complaint.  Patient presents with thickened elongated toenails.  She states that it is causing a lot of pain especially when ambulating.  She states that has been going on for about a month.  It is getting worse.  Is pain is elevated when ambulating with excessive pressure.  She also has secondary complaint of right first MPJ has been getting progressively worse hurts with ambulation.  She wanted me to evaluate.  She denies any other acute treatments.  She is tried some over-the-counter stuff which has not helped.  Objective:   There were no vitals filed for this visit.  Podiatric Exam: Vascular: dorsalis pedis and posterior tibial pulses are palpable bilateral. Capillary return is immediate. Temperature gradient is WNL. Skin turgor WNL  Sensorium: Normal Semmes Weinstein monofilament test. Normal tactile sensation bilaterally. Nail Exam: Pt has thick disfigured discolored nails with subungual debris noted bilateral entire nail hallux through fifth toenails Ulcer Exam: There is no evidence of ulcer or pre-ulcerative changes or infection. Orthopedic Exam: Muscle tone and strength are WNL.  Pain with range of motion of the first MPJ.  Pain on palpation to the MPJ joint.  No deep intra-articular pain noted no crepitus noted. Skin: No Porokeratosis. No infection or ulcers  Assessment & Plan:  Patient was evaluated and treated and all questions answered.  Right first MPJ capsulitis -I explained the patient the etiology of capsulitis and various treatment options were discussed.  Given the amount of pain that she is having I believe she would benefit from a steroid injection of decrease of inflammatory component associate with pain.  Patient agrees with plan like to proceed with steroid injection. -A  steroid injection was performed at right first MPJ using 1% plain Lidocaine and 10 mg of Kenalog. This was well tolerated.    Onychomycosis with pain  -Nails palliatively debrided as below. -Educated on self-care  Procedure: Nail Debridement Rationale: pain  Type of Debridement: manual, sharp debridement. Instrumentation: Nail nipper, rotary burr. Number of Nails: 10  Procedures and Treatment: Consent by patient was obtained for treatment procedures. The patient understood the discussion of treatment and procedures well. All questions were answered thoroughly reviewed. Debridement of mycotic and hypertrophic toenails, 1 through 5 bilateral and clearing of subungual debris. No ulceration, no infection noted.  Return Visit-Office Procedure: Patient instructed to return to the office for a follow up visit 3 months for continued evaluation and treatment.  Nicholes Rough, DPM    Return in about 3 months (around 06/12/2021).

## 2021-03-18 NOTE — Progress Notes (Deleted)
Cardiology Office Note   Date:  03/18/2021   ID:  Felicia Cross, Felicia Cross [birth year], MRN [id number]  PCP:  Deeann Saint, MD  Cardiologist:  Winfield Rast  No chief complaint on file.    History of Present Illness: Felicia Cross is a 87 y.o. female who presents for ongoing assessment and management of hypertension, minimal CAD and HL. She has recent history of being seen in ED for falls. She was not losing consciousness. She sustained injuries to her lip and hand during the last fall, causing her to be seen in ED. There was not clear etiology of her frequent falls.   On last office visit, 02/15/2021, she continued to have complaints of  falls preceded by dizziness according to her husband.  She denies dizziness. She has a history of vertigo for the last two years, was prescribed meclozine but has forgotten to take it.  She has not lost consciousness. Has felt "my knees buckle" sometimes.   I decreased her HCTZ from 25 mg daily to 12.5 mg daily and allowed for permission hypertension due to her age and falls. She was to keep track of her BP and home and given refill on Antivert.        Past Medical History:     Past Medical History:  Diagnosis Date   Anemia 01/18/2016   -reports on and off her whole life, on oral iron in the past remotely per report   Arthritis    B12 deficiency 01/18/2016   CAD (coronary artery disease)    CVD (cerebrovascular disease)    Diverticulosis of colon    H. pylori infection 10/20/2015   Hyperlipidemia    Hypertension    Osteoarthritis 03/07/2006   Qualifier: Diagnosis of  By: Cato Mulligan MD, Bruce      Past Surgical History:  Procedure Laterality Date   ABDOMINAL HYSTERECTOMY  1984   REPLACEMENT TOTAL KNEE  2007   left   TOTAL HIP ARTHROPLASTY  2010   Dr. Turner Daniels, right   TOTAL KNEE ARTHROPLASTY Right 09/14/2015   Procedure: TOTAL KNEE ARTHROPLASTY;  Surgeon: Dannielle Huh, MD;  Location: [facility] OR;  Service: Orthopedics;  Laterality: Right;     Current  Outpatient Medications  Medication Sig Dispense Refill   aspirin EC 81 MG tablet Take 1 tablet (81 mg total) by mouth daily.     cloNIDine (CATAPRES) 0.1 MG tablet Take 0.1 mg by mouth 2 (two) times daily.     cyanocobalamin 500 MCG tablet Take 500 mcg by mouth daily.     dimenhyDRINATE (DRAMAMINE PO) Take by mouth. As needed for vertigo     gabapentin (NEURONTIN) 100 MG capsule Take 1 capsule by mouth at bedtime 90 capsule 0   hydrochlorothiazide (HYDRODIURIL) 12.5 MG tablet Take 12.5 mg by mouth daily. 1 Tablet Daily     losartan (COZAAR) 100 MG tablet Take 1 tablet by mouth once daily 90 tablet 1   meclizine (ANTIVERT) 25 MG tablet Take 1 tablet (25 mg total) by mouth as needed for dizziness. 30 tablet 0   metoprolol tartrate (LOPRESSOR) 25 MG tablet Take 1 tablet (25 mg total) by mouth 2 (two) times daily. 180 tablet 3   Multiple Vitamin (MULTIVITAMIN WITH MINERALS) TABS Take 1 tablet by mouth daily.      simvastatin (ZOCOR) 40 MG tablet Take 1 tablet by mouth once daily 46 tablet 3   No current facility-administered medications for this visit.    Allergies:   Codeine and Hydrocodone  Social History:  The patient  reports that she quit smoking about 40 years ago. Her smoking use included cigarettes. She has a 30.00 pack-year smoking history. She has never used smokeless tobacco. She reports current alcohol use. She reports that she does not use drugs.   Family History:  The patient's family history includes Diabetes in her mother; Heart attack in her brother and sister; Hypertension in her father; Kidney disease in her father.    ROS: All other systems are reviewed and negative. Unless otherwise mentioned in H&P    PHYSICAL EXAM: VS:  There were no vitals taken for this visit. , BMI There is no height or weight on file to calculate BMI. GEN: Well nourished, well developed, in no acute distress HEENT: normal Neck: no JVD, carotid bruits, or masses Cardiac: ***RRR; no murmurs,  rubs, or gallops,no edema  Respiratory:  Clear to auscultation bilaterally, normal work of breathing GI: soft, nontender, nondistended, + BS MS: no deformity or atrophy Skin: warm and dry, no rash Neuro:  Strength and sensation are intact Psych: euthymic mood, full affect   EKG:  EKG {ACTION; IS/IS [id number]} ordered today. The ekg ordered today demonstrates ***   Recent Labs: 12/18/2020: ALT 9; BUN 12; Creatinine, Ser 0.97; Hemoglobin 11.8; Platelets 249.0; Potassium 3.9; Sodium 138; TSH 2.44    Lipid Panel    Component Value Date/Time   CHOL 209 (H) 12/18/2020 1042   CHOL 180 11/05/2018 1233   TRIG 61.0 12/18/2020 1042   HDL 63.70 12/18/2020 1042   HDL 72 11/05/2018 1233   CHOLHDL 3 12/18/2020 1042   VLDL 12.2 12/18/2020 1042   LDLCALC 133 (H) 12/18/2020 1042   LDLCALC 95 11/05/2018 1233   LDLDIRECT 138.1 05/19/2011 1055      Wt Readings from Last 3 Encounters:  02/15/21 158 lb 9.6 oz (71.9 kg)  01/21/21 150 lb 3.2 oz (68.1 kg)  08/20/20 190 lb (86.2 kg)      Other studies Reviewed: Stress Echo 08/16/2013 Stress ECG conclusions: There were no stress arrhythmias    or conduction abnormalities. The stress ECG was negative    for ischemia. [facility] scoring: exercise time of 5.58min;    maximum ST deviation of 30mm; no angina; resulting score is    6. This score predicts a low risk of cardiac events.  - Staged echo: There was no echocardiographic evidence for    stress-induced ischemia.   ASSESSMENT AND PLAN:  1.  ***   Current medicines are reviewed at length with the patient today.  I have spent *** dedicated to the care of this patient on the date of this encounter to include pre-visit review of records, assessment, management and diagnostic testing,with shared decision making.  Labs/ tests ordered today include: *** Bettey Mare. Liborio Nixon, ANP, AACC   03/18/2021 11:31 AM    [facility] [address] Office [phone]  Fax [phone]  Notice: This dictation was prepared with Dragon dictation along with smaller phrase technology. Any transcriptional errors that result from this process are unintentional and may not be corrected upon review.

## 2021-03-19 ENCOUNTER — Ambulatory Visit: Payer: Medicare Other | Admitting: Adult Health

## 2021-03-24 ENCOUNTER — Ambulatory Visit: Payer: Medicare Other | Admitting: Family Medicine

## 2021-03-31 ENCOUNTER — Ambulatory Visit (INDEPENDENT_AMBULATORY_CARE_PROVIDER_SITE_OTHER): Payer: Medicare Other | Admitting: Family Medicine

## 2021-03-31 VITALS — BP 140/82 | HR 102 | Temp 97.9°F | Wt 154.2 lb

## 2021-03-31 DIAGNOSIS — R000 Tachycardia, unspecified: Secondary | ICD-10-CM

## 2021-03-31 DIAGNOSIS — R413 Other amnesia: Secondary | ICD-10-CM | POA: Diagnosis not present

## 2021-03-31 DIAGNOSIS — R296 Repeated falls: Secondary | ICD-10-CM | POA: Diagnosis not present

## 2021-03-31 DIAGNOSIS — I10 Essential (primary) hypertension: Secondary | ICD-10-CM

## 2021-03-31 NOTE — Progress Notes (Signed)
Subjective:    Patient ID: Felicia Cross, female    DOB: 1932/05/22, 87 y.o.   MRN: [id number]  Chief Complaint  Patient presents with   Follow-up    BP Would like referral to home health for assistance, and mental  Pt accompanied by her daughter.  Other family members on the phone via face time.  HPI Patient was seen today for follow-up on chronic conditions.  Patient still having frequent falls.  Patient becomes afraid to move when walking.  Tries to hold onto the wall.  Patient had 2 falls one in the kitchen and 1 in the bedroom since last OFV.  Patient using walker to aid with ambulation.  Patient had home health PT and OT for proximately 4 weeks but they stopped coming.  Patient tries to do exercises learned, daily.  Patient has not been taking her medications.  Only taking losartan, hydrochlorothiazide, and simvastatin.  Patient did not feel like she needed the other medications.  Patient occasionally checking BP at home.  Patient's family inquires about home health to help with ADLs and referral for [facility]  Past Medical History:  Diagnosis Date   Anemia 01/18/2016   -reports on and off her whole life, on oral iron in the past remotely per report   Arthritis    B12 deficiency 01/18/2016   CAD (coronary artery disease)    CVD (cerebrovascular disease)    Diverticulosis of colon    H. pylori infection 10/20/2015   Hyperlipidemia    Hypertension    Osteoarthritis 03/07/2006   Qualifier: Diagnosis of  By: Cato Mulligan MD, Bruce      Allergies  Allergen Reactions   Codeine Nausea And Vomiting   Hydrocodone Nausea And Vomiting    ROS General: Denies fever, chills, night sweats, changes in weight, changes in appetite  +balance issues HEENT: Denies headaches, ear pain, changes in vision, rhinorrhea, sore throat CV: Denies CP, palpitations, SOB, orthopnea Pulm: Denies SOB, cough, wheezing GI: Denies abdominal pain, nausea, vomiting, diarrhea, constipation GU: Denies dysuria, hematuria,  frequency, vaginal discharge Msk: Denies muscle cramps, joint pains Neuro: Denies weakness, numbness, tingling Skin: Denies rashes, bruising Psych: Denies depression, anxiety, hallucinations     Objective:    Blood pressure 140/82, pulse (!) 102, temperature 97.9 F (36.6 C), temperature source Oral, weight 154 lb 3.2 oz (69.9 kg), SpO2 95 %.  Gen. Pleasant, well-nourished, in no distress, normal affect   HEENT: [place]/AT, face symmetric, conjunctiva clear, no scleral icterus, PERRLA, EOMI, nares patent without drainage Lungs: no accessory muscle use, CTAB, no wheezes or rales Cardiovascular: RRR, no m/r/g, no peripheral edema Musculoskeletal: No deformities, no cyanosis or clubbing, normal tone Neuro:  A&Ox3, CN II-XII intact, ambulating with walker Skin:  Warm, no lesions/ rash   Wt Readings from Last 3 Encounters:  03/31/21 154 lb 3.2 oz (69.9 kg)  02/15/21 158 lb 9.6 oz (71.9 kg)  01/21/21 150 lb 3.2 oz (68.1 kg)    Lab Results  Component Value Date   WBC 5.1 12/18/2020   HGB 11.8 (L) 12/18/2020   HCT 36.0 12/18/2020   PLT 249.0 12/18/2020   GLUCOSE 82 12/18/2020   CHOL 209 (H) 12/18/2020   TRIG 61.0 12/18/2020   HDL 63.70 12/18/2020   LDLDIRECT 138.1 05/19/2011   LDLCALC 133 (H) 12/18/2020   ALT 9 12/18/2020   AST 20 12/18/2020   NA 138 12/18/2020   K 3.9 12/18/2020   CL 100 12/18/2020   CREATININE 0.97 12/18/2020   BUN 12  12/18/2020   CO2 29 12/18/2020   TSH 2.44 12/18/2020   INR 1.04 09/04/2015   HGBA1C 5.9 12/18/2020    Assessment/Plan:  Essential hypertension -uncontrolled 2/2 medication non compliance -discussed the importance of taking medications -family encouraged to get pt a pill organizer -Currently taking losartan 100 mg and HCTZ 12.5 mg. -Patient advised to restart medications including metoprolol tartrate 25 mg twice daily, losartan 100 mg daily, HCTZ 12.5 mg -will need to monitor bp while restarting meds.  If bp still >140/90  on metoprolol,  losartan, HCTZ then can restart clonidine 0.1 mg.  Memory deficit  -discussed neuropsych testing -given lists of area [facility] providers.  Encouraged to call and schedule appt. -family to check with insurance regarding PCS services - Plan: Ambulatory referral to Neurology  Frequent falls  -pt ambulating well in clinic without instability.  Possibly psych component, becomes anxious about the possibility of falling. -unclear why [place] services stopped as would likely benefit from continued therapy. - Plan: Ambulatory referral to Neurology  Tachycardia -asymptomatic -likely from increased walking  -recheck -encouraged to restart metoprolol tartrate 25 mg BID  F/u 6-8 wks, sooner if needed  More than 50% of over 45 minutes spent in total in caring for this patient face-to-face, counseling and/or coordinating care.    Grier Mitts, MD

## 2021-03-31 NOTE — Patient Instructions (Signed)
Behavioral Health Services: -to make an appointment contact the office/provider you are interested in seeing.  No referral is needed.  The below is not an all inclusive list, but will help you get started.  [email or web address] -counseling located off of [address]  [email or web address] -website helps you find providers in your area  [facility] -Located off of [address] across from [employer]  Dr. Akintayo is a Psychiatrist with [facility]. [phone]  [facility]  [facility]  -[address]  [phone] -a place in town that has counseling and Psychiatry services.    

## 2021-04-18 ENCOUNTER — Encounter: Payer: Self-pay | Admitting: Family Medicine

## 2021-04-28 ENCOUNTER — Other Ambulatory Visit: Payer: Self-pay | Admitting: Cardiology

## 2021-04-28 ENCOUNTER — Other Ambulatory Visit: Payer: Self-pay | Admitting: Family Medicine

## 2021-04-28 DIAGNOSIS — I10 Essential (primary) hypertension: Secondary | ICD-10-CM

## 2021-05-07 ENCOUNTER — Telehealth: Payer: Self-pay | Admitting: Family Medicine

## 2021-05-07 NOTE — Chronic Care Management (AMB) (Signed)
°  Chronic Care Management   Outreach Note  05/07/2021 Name: Felicia Cross MRN: [id number] DOB: [birth year]  Referred by: Deeann Saint, MD Reason for referral : No chief complaint on file.   An unsuccessful telephone outreach was attempted today. The patient was referred to the pharmacist for assistance with care management and care coordination.   Follow Up Plan:   Tatjana Dellinger [facility] Scheduler

## 2021-05-11 NOTE — Progress Notes (Deleted)
Cardiology Office Note:    Date:  05/11/2021   ID:  Felicia Cross, DOB 1932-10-29, MRN [id number]  PCP:  Billie Ruddy, MD [place] Cardiologist: Kirk Ruths, MD  Reason for visit: Follow-up  History of Present Illness:    Felicia Cross is a 88 y.o. female with a hx of hypertension, hyperlipidemia and minimal CAD.  LHC August 2006 that showed minor disease in the diagonal and distal LAD.   She last saw Jory Sims in October 2022 and complained about falls preceded by dizziness.  Felicia Cross decreased her HCTZ from 25 mg to 12.5 mg allowing permissive hypertension to avoid dizziness and falls.  ***  Hypertension -*** -Goal BP is <130/80.  Recommend DASH diet (high in vegetables, fruits, low-fat dairy products, whole grains, poultry, fish, and nuts and low in sweets, sugar-sweetened beverages, and red meats), salt restriction and increase physical activity.  CAD -*** -Continue aspirin and statin.  Hyperlipidemia -*** -Discussed cholesterol lowering diets - Mediterranean diet, DASH diet, vegetarian diet, low-carbohydrate diet and avoidance of trans fats.  Discussed healthier choice substitutes.  Nuts, high-fiber foods, and fiber supplements may also improve lipids.    Disposition - Follow-up in ***     Past Medical History:  Diagnosis Date   Anemia 01/18/2016   -reports on and off her whole life, on oral iron in the past remotely per report   Arthritis    B12 deficiency 01/18/2016   CAD (coronary artery disease)    CVD (cerebrovascular disease)    Diverticulosis of colon    H. pylori infection 10/20/2015   Hyperlipidemia    Hypertension    Osteoarthritis 03/07/2006   Qualifier: Diagnosis of  By: Leanne Chang MD, Bruce      Past Surgical History:  Procedure Laterality Date   ABDOMINAL HYSTERECTOMY  1984   REPLACEMENT TOTAL KNEE  2007   left   TOTAL HIP ARTHROPLASTY  2010   Dr. Mayer Camel, right   TOTAL KNEE ARTHROPLASTY Right 09/14/2015   Procedure:  TOTAL KNEE ARTHROPLASTY;  Surgeon: Vickey Huger, MD;  Location: [place];  Service: Orthopedics;  Laterality: Right;    Current Medications: No outpatient medications have been marked as taking for the 05/12/21 encounter (Appointment) with Warren Lacy, PA-C.     Allergies:   Codeine and Hydrocodone   Social History   Socioeconomic History   Marital status: Married    Spouse name: Not on file   Number of children: Not on file   Years of education: Not on file   Highest education level: Not on file  Occupational History   Not on file  Tobacco Use   Smoking status: Former    Packs/day: 1.00    Years: 30.00    Pack years: 30.00    Types: Cigarettes    Quit date: 11/01/1980    Years since quitting: 40.5   Smokeless tobacco: Never  Substance and Sexual Activity   Alcohol use: Yes    Alcohol/week: 0.0 standard drinks    Comment: glass of wine   Drug use: No   Sexual activity: Not on file  Other Topics Concern   Not on file  Social History Narrative   Updated 01/07/15   Work or School: retired from [employer], volunteers at [place]      Home 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, Curator and two great grandchildren age 54 and 81      Spiritual Beliefs: Christian      Lifestyle: regular exercise at  [employer]; healthy diet      Social Determinants of Health   Financial Resource Strain: Not on file  Food Insecurity: Not on file  Transportation Needs: Not on file  Physical Activity: Not on file  Stress: Not on file  Social Connections: Not on file     Family History: The patient's family history includes Diabetes in her mother; Heart attack in her brother and sister; Hypertension in her father; Kidney disease in her father. There is no history of Colon cancer, Stomach cancer, Esophageal cancer, or Rectal cancer.  ROS:   Please see the history of present illness.     EKGs/Labs/Other Studies Reviewed:    EKG:  The ekg ordered today demonstrates ***  Recent  Labs: 12/18/2020: ALT 9; BUN 12; Creatinine, Ser 0.97; Hemoglobin 11.8; Platelets 249.0; Potassium 3.9; Sodium 138; TSH 2.44   Recent Lipid Panel Lab Results  Component Value Date/Time   CHOL 209 (H) 12/18/2020 10:42 AM   CHOL 180 11/05/2018 12:33 PM   TRIG 61.0 12/18/2020 10:42 AM   HDL 63.70 12/18/2020 10:42 AM   HDL 72 11/05/2018 12:33 PM   LDLCALC 133 (H) 12/18/2020 10:42 AM   LDLCALC 95 11/05/2018 12:33 PM   LDLDIRECT 138.1 05/19/2011 10:55 AM    Physical Exam:    VS:  There were no vitals taken for this visit.   No data found.  Wt Readings from Last 3 Encounters:  03/31/21 154 lb 3.2 oz (69.9 kg)  02/15/21 158 lb 9.6 oz (71.9 kg)  01/21/21 150 lb 3.2 oz (68.1 kg)     GEN: *** Well nourished, well developed in no acute distress HEENT: Normal NECK: No JVD; No carotid bruits CARDIAC: ***RRR, no murmurs, rubs, gallops RESPIRATORY:  Clear to auscultation without rales, wheezing or rhonchi  ABDOMEN: Soft, non-tender, non-distended MUSCULOSKELETAL: No edema; No deformity  SKIN: Warm and dry NEUROLOGIC:  Alert and oriented PSYCHIATRIC:  Normal affect     ASSESSMENT AND PLAN   ***   {Are you ordering a CV Procedure (e.g. stress test, cath, DCCV, TEE, etc)?   Press F2        :[id number]    Medication Adjustments/Labs and Tests Ordered: Current medicines are reviewed at length with the patient today.  Concerns regarding medicines are outlined above.  No orders of the defined types were placed in this encounter.  No orders of the defined types were placed in this encounter.   There are no Patient Instructions on file for this visit.   Signed, Warren Lacy, PA-C  05/11/2021 9:38 PM    [facility]

## 2021-05-12 ENCOUNTER — Ambulatory Visit: Payer: Medicare Other | Admitting: Physician Assistant

## 2021-05-12 DIAGNOSIS — I2510 Atherosclerotic heart disease of native coronary artery without angina pectoris: Secondary | ICD-10-CM

## 2021-05-12 DIAGNOSIS — I10 Essential (primary) hypertension: Secondary | ICD-10-CM

## 2021-05-19 ENCOUNTER — Encounter: Payer: Self-pay | Admitting: Family Medicine

## 2021-05-19 ENCOUNTER — Ambulatory Visit (INDEPENDENT_AMBULATORY_CARE_PROVIDER_SITE_OTHER): Payer: Medicare Other | Admitting: Family Medicine

## 2021-05-19 VITALS — BP 126/88 | HR 80 | Temp 96.6°F

## 2021-05-19 DIAGNOSIS — I10 Essential (primary) hypertension: Secondary | ICD-10-CM

## 2021-05-19 DIAGNOSIS — Z7189 Other specified counseling: Secondary | ICD-10-CM | POA: Diagnosis not present

## 2021-05-19 DIAGNOSIS — R609 Edema, unspecified: Secondary | ICD-10-CM

## 2021-05-19 DIAGNOSIS — M25622 Stiffness of left elbow, not elsewhere classified: Secondary | ICD-10-CM | POA: Diagnosis not present

## 2021-05-19 DIAGNOSIS — R296 Repeated falls: Secondary | ICD-10-CM

## 2021-05-19 NOTE — Progress Notes (Signed)
Subjective:    Patient ID: Felicia Cross, female    DOB: 12-16-1932, 88 y.o.   MRN: [id number]  Chief Complaint  Patient presents with   Follow-up    BP and frequent falls.  Patient accompanied by her daughter.  HPI Patient was seen today for follow-up on chronic concerns.  Patient states she is taking all of her blood pressure medications since last visit.  Marked improvement in BP this visit.  Patient working on drinking more water.  Has not really been doing exercises as shown by home health PT/OT.  Per patient's daughter a DNR form is needed per home health.  Patient endorses agreement with DNR.  Mentions spilling bleach on right foot a few days ago.  Patient's daughter was unaware of this.  Patient notes bilateral pedal edema R>L.  States edema resolves in a.m. and returns by the evening.  Patient denies increased sodium intake or pain in bilateral LEs.  Past Medical History:  Diagnosis Date   Anemia 01/18/2016   -reports on and off her whole life, on oral iron in the past remotely per report   Arthritis    B12 deficiency 01/18/2016   CAD (coronary artery disease)    CVD (cerebrovascular disease)    Diverticulosis of colon    H. pylori infection 10/20/2015   Hyperlipidemia    Hypertension    Osteoarthritis 03/07/2006   Qualifier: Diagnosis of  By: Cato Mulligan MD, Bruce      Allergies  Allergen Reactions   Codeine Nausea And Vomiting   Hydrocodone Nausea And Vomiting    ROS General: Denies fever, chills, night sweats, changes in weight, changes in appetite HEENT: Denies headaches, ear pain, changes in vision, rhinorrhea, sore throat CV: Denies CP, palpitations, SOB, orthopnea Pulm: Denies SOB, cough, wheezing GI: Denies abdominal pain, nausea, vomiting, diarrhea, constipation GU: Denies dysuria, hematuria, frequency, vaginal discharge Msk: Denies muscle cramps, joint pains Neuro: Denies weakness, numbness, tingling Skin: Denies rashes, bruising Psych: Denies depression,  anxiety, hallucinations     Objective:    Blood pressure 126/88, pulse 80, temperature (!) 96.6 F (35.9 C), temperature source Temporal, SpO2 97 %.  Gen. Pleasant, well-nourished, in no distress, normal affect   HEENT: [place]/AT, face symmetric, conjunctiva clear, no scleral icterus, PERRLA, EOMI, nares patent without drainage Lungs: no accessory muscle use, CTAB, no wheezes or rales Cardiovascular: RRR, no m/r/g, bilateral nonpitting pedal edema. Abdomen: BS present, soft, NT/ND, no hepatosplenomegaly. Musculoskeletal: Limited ROM of left elbow approximately 45 degrees flexion and extension at elbow.  Mild deformity of left hand.  Able to flex and extend fingers of left hand.  Left fourth digit angulated.  No cyanosis or clubbing, normal tone Neuro:  A&Ox3, CN II-XII intact, using rolling walker to aid with ambulation Skin:  Warm, no lesions/ rash.  Mild edema of the right medial ankle with pinpoint areas of eschar, no erythema   Wt Readings from Last 3 Encounters:  03/31/21 154 lb 3.2 oz (69.9 kg)  02/15/21 158 lb 9.6 oz (71.9 kg)  01/21/21 150 lb 3.2 oz (68.1 kg)    Lab Results  Component Value Date   WBC 5.1 12/18/2020   HGB 11.8 (L) 12/18/2020   HCT 36.0 12/18/2020   PLT 249.0 12/18/2020   GLUCOSE 82 12/18/2020   CHOL 209 (H) 12/18/2020   TRIG 61.0 12/18/2020   HDL 63.70 12/18/2020   LDLDIRECT 138.1 05/19/2011   LDLCALC 133 (H) 12/18/2020   ALT 9 12/18/2020   AST 20 12/18/2020  NA 138 12/18/2020   K 3.9 12/18/2020   CL 100 12/18/2020   CREATININE 0.97 12/18/2020   BUN 12 12/18/2020   CO2 29 12/18/2020   TSH 2.44 12/18/2020   INR 1.04 09/04/2015   HGBA1C 5.9 12/18/2020    Assessment/Plan:  Essential hypertension -Controlled -Continue current medication including losartan 100 mg daily, hydrochlorothiazide, metoprolol 25 mg twice daily -Monitor potassium as may need potassium supplement while on HCTZ -Monitor for dizziness. -Continue checking BP at home and  keep a log of readings.  Frequent falls -Continue using walker to aid with ambulation -Continue HH -Fall prevention  Decreased range of motion of elbow, left -s/p fall in parking lot at podiatrist on 08/20/20 -continue [facility] PT/OT -Patient encouraged to do exercises on her own  Dependent edema -2/2 increased sitting -Discussed elevating LEs when sitting, decreasing sodium intake, compression socks or TED hose  Counseling regarding end of life decision making -Discussed DNR -DNR completed this visit  F/u in the next few months, sooner if needed  Grier Mitts, MD

## 2021-06-16 ENCOUNTER — Other Ambulatory Visit: Payer: Self-pay

## 2021-06-16 ENCOUNTER — Ambulatory Visit: Payer: Medicare Other | Admitting: Podiatry

## 2021-06-16 ENCOUNTER — Encounter: Payer: Self-pay | Admitting: Podiatry

## 2021-06-16 DIAGNOSIS — B351 Tinea unguium: Secondary | ICD-10-CM | POA: Diagnosis not present

## 2021-06-16 DIAGNOSIS — M79675 Pain in left toe(s): Secondary | ICD-10-CM | POA: Diagnosis not present

## 2021-06-16 DIAGNOSIS — M79674 Pain in right toe(s): Secondary | ICD-10-CM

## 2021-06-16 NOTE — Progress Notes (Signed)

## 2021-07-02 ENCOUNTER — Ambulatory Visit: Payer: Medicare Other | Admitting: Family Medicine

## 2021-07-05 ENCOUNTER — Encounter: Payer: Self-pay | Admitting: Family Medicine

## 2021-07-05 ENCOUNTER — Ambulatory Visit (INDEPENDENT_AMBULATORY_CARE_PROVIDER_SITE_OTHER): Payer: Medicare Other | Admitting: Family Medicine

## 2021-07-05 VITALS — BP 134/68 | HR 73 | Temp 97.7°F

## 2021-07-05 DIAGNOSIS — R296 Repeated falls: Secondary | ICD-10-CM | POA: Diagnosis not present

## 2021-07-05 DIAGNOSIS — I10 Essential (primary) hypertension: Secondary | ICD-10-CM | POA: Diagnosis not present

## 2021-07-05 DIAGNOSIS — R2689 Other abnormalities of gait and mobility: Secondary | ICD-10-CM | POA: Diagnosis not present

## 2021-07-05 LAB — COMPREHENSIVE METABOLIC PANEL
ALT: 11 U/L (ref 0–35)
AST: 22 U/L (ref 0–37)
Albumin: 4.2 g/dL (ref 3.5–5.2)
Alkaline Phosphatase: 38 U/L — ABNORMAL LOW (ref 39–117)
BUN: 14 mg/dL (ref 6–23)
CO2: 30 mEq/L (ref 19–32)
Calcium: 10 mg/dL (ref 8.4–10.5)
Chloride: 97 mEq/L (ref 96–112)
Creatinine, Ser: 0.95 mg/dL (ref 0.40–1.20)
GFR: 54.32 mL/min — ABNORMAL LOW (ref >60.00)
Glucose, Bld: 78 mg/dL (ref 70–99)
Potassium: 3.5 mEq/L (ref 3.5–5.1)
Sodium: 134 mEq/L — ABNORMAL LOW (ref 135–145)
Total Bilirubin: 0.6 mg/dL (ref 0.2–1.2)
Total Protein: 7 g/dL (ref 6.0–8.3)

## 2021-07-05 LAB — CBC WITH DIFFERENTIAL/PLATELET
Basophils Absolute: 0 10*3/uL (ref 0.0–0.1)
Basophils Relative: 0.8 % (ref 0.0–3.0)
Eosinophils Absolute: 0.1 10*3/uL (ref 0.0–0.7)
Eosinophils Relative: 1.8 % (ref 0.0–5.0)
HCT: 32.8 % — ABNORMAL LOW (ref 36.0–46.0)
Hemoglobin: 11.5 g/dL — ABNORMAL LOW (ref 12.0–15.0)
Lymphocytes Relative: 38.8 % (ref 12.0–46.0)
Lymphs Abs: 1.4 10*3/uL (ref 0.7–4.0)
MCHC: 35.1 g/dL (ref 30.0–36.0)
MCV: 100 fl (ref 78.0–100.0)
Monocytes Absolute: 0.4 10*3/uL (ref 0.1–1.0)
Monocytes Relative: 9.5 % (ref 3.0–12.0)
Neutro Abs: 1.8 10*3/uL (ref 1.4–7.7)
Neutrophils Relative %: 49.1 % (ref 43.0–77.0)
Platelets: 239 10*3/uL (ref 150.0–400.0)
RBC: 3.29 Mil/uL — ABNORMAL LOW (ref 3.87–5.11)
RDW: 15.5 % (ref 11.5–15.5)
WBC: 3.7 10*3/uL — ABNORMAL LOW (ref 4.0–10.5)

## 2021-07-05 NOTE — Progress Notes (Signed)
Subjective:    Patient ID: Felicia Cross, female    DOB: 1932-06-02, 88 y.o.   MRN: [id number]  Chief Complaint  Patient presents with   Follow-up    Loosing balance on left side. Fell 2 weeks ago, hit her head, has a knot on her head, had a bruise under her eye. Would a 4 wheel walker woud be better? Has been losing balance when going for walks.  Patient accompanied by her husband and caregiver.  HPI Patient was seen today for follow-up and ongoing concerns.  Per patient's husband and caregiver pt had 2 falls in the last few weeks.  The first fall occurred while patient was coming out of the bathroom.  Hit head on tub.  Had bruise underneath left eye.  patient states she was holding onto the wall is unable to get her walker into the bathroom.  Feeling dizzy, or tripping.  Denies LOC.  Patient had another fall when trying to go on a walk with her caregiver.  Patient had her walker, caregiver was able to help ease patient's fall.  Patient around 20-32 ounces of water per day, with other fluids.  Per patient's husband she gets nervous that she may fall and is unable to move.  Family inquires about four-wheel walker and continued PT. taking aspirin 81 mg daily.  Past Medical History:  Diagnosis Date   Anemia 01/18/2016   -reports on and off her whole life, on oral iron in the past remotely per report   Arthritis    B12 deficiency 01/18/2016   CAD (coronary artery disease)    CVD (cerebrovascular disease)    Diverticulosis of colon    H. pylori infection 10/20/2015   Hyperlipidemia    Hypertension    Osteoarthritis 03/07/2006   Qualifier: Diagnosis of  By: Leanne Chang MD, Bruce      Allergies  Allergen Reactions   Codeine Nausea And Vomiting   Hydrocodone Nausea And Vomiting    ROS General: Denies fever, chills, night sweats, changes in weight, changes in appetite + falls HEENT: Denies headaches, ear pain, changes in vision, rhinorrhea, sore throat CV: Denies CP, palpitations, SOB,  orthopnea Pulm: Denies SOB, cough, wheezing GI: Denies abdominal pain, nausea, vomiting, diarrhea, constipation GU: Denies dysuria, hematuria, frequency, vaginal discharge Msk: Denies muscle cramps, joint pains Neuro: Denies weakness, numbness, tingling  +L sided weakness Skin: Denies rashes, bruising  + ecchymosis underneath left eye Psych: Denies depression, hallucinations  + anxiety about potentially falling    Objective:    Blood pressure 134/68, pulse 73, temperature 97.7 F (36.5 C), temperature source Oral, SpO2 100 %.  Gen. Pleasant, well-nourished, in no distress, normal affect   HEENT: [place]/AT, face symmetric, conjunctiva clear, no scleral icterus, PERRLA, EOMI, nares patent without drainage.  TMs normal bilaterally. Lungs: no accessory muscle use, CTAB, no wheezes or rales Cardiovascular: RRR, no m/r/g, no peripheral edema Musculoskeletal: 5/5 strength in right UE and right LE, 4/5 strength LUE and LLE.  no deformities, no cyanosis or clubbing, normal tone Neuro:  A&Ox3, CN II-XII intact, slowed, stable gait with walker Skin:  Warm, no lesions/ rash   Wt Readings from Last 3 Encounters:  03/31/21 154 lb 3.2 oz (69.9 kg)  02/15/21 158 lb 9.6 oz (71.9 kg)  01/21/21 150 lb 3.2 oz (68.1 kg)    Lab Results  Component Value Date   WBC 5.1 12/18/2020   HGB 11.8 (L) 12/18/2020   HCT 36.0 12/18/2020   PLT 249.0 12/18/2020   GLUCOSE  82 12/18/2020   CHOL 209 (H) 12/18/2020   TRIG 61.0 12/18/2020   HDL 63.70 12/18/2020   LDLDIRECT 138.1 05/19/2011   LDLCALC 133 (H) 12/18/2020   ALT 9 12/18/2020   AST 20 12/18/2020   NA 138 12/18/2020   K 3.9 12/18/2020   CL 100 12/18/2020   CREATININE 0.97 12/18/2020   BUN 12 12/18/2020   CO2 29 12/18/2020   TSH 2.44 12/18/2020   INR 1.04 09/04/2015   HGBA1C 5.9 12/18/2020    Assessment/Plan:  Unstable balance  -likely 2/2 several things including vertigo, anxiety, orthostatic hypotension/dehydration.  Will obtain CT head to r/o  intracranial process such as NPH, CVA, or mass effect. -Patient advised to continue using walker to aid with ambulation -Continue fall prevention at home -We will restart PT as some improvement was noted -Patient encouraged to call and schedule an appointment for counseling regarding fear of falling.   - Plan: CT HEAD WO CONTRAST (5MM), Ambulatory referral to [facility], CMP, TSH, T4, Free, CBC with Differential/Platelet  Essential hypertension  -controlled -continue losartan 100 mg and metoprolol tartrate 25 mg twice daily -Continue lifestyle modifications and increased hydration encouraged - Plan: CMP, TSH, T4, Free  Frequent falls  - Plan: CT HEAD WO CONTRAST (5MM), Ambulatory referral to Home Health, CMP, TSH, T4, Free, CBC with Differential/Platelet  F/u in 1 month, sooner if needed  Grier Mitts, MD

## 2021-07-06 LAB — T4, FREE: Free T4: 0.99 ng/dL (ref 0.60–1.60)

## 2021-07-06 LAB — TSH: TSH: 2.04 u[IU]/mL (ref 0.35–5.50)

## 2021-07-08 ENCOUNTER — Other Ambulatory Visit: Payer: Self-pay

## 2021-07-08 DIAGNOSIS — D649 Anemia, unspecified: Secondary | ICD-10-CM

## 2021-07-08 DIAGNOSIS — E871 Hypo-osmolality and hyponatremia: Secondary | ICD-10-CM

## 2021-07-08 DIAGNOSIS — E538 Deficiency of other specified B group vitamins: Secondary | ICD-10-CM

## 2021-07-08 NOTE — Addendum Note (Signed)
Addended by: Anderson Malta on: 07/08/2021 11:13 AM ? ? Modules accepted: Orders ? ?

## 2021-07-12 ENCOUNTER — Other Ambulatory Visit: Payer: Self-pay

## 2021-07-12 ENCOUNTER — Ambulatory Visit (HOSPITAL_BASED_OUTPATIENT_CLINIC_OR_DEPARTMENT_OTHER)
Admission: RE | Admit: 2021-07-12 | Discharge: 2021-07-12 | Disposition: A | Discharge status: Home / Self Care | Payer: Medicare Other | Source: Ambulatory Visit | Attending: Family Medicine | Admitting: Family Medicine

## 2021-07-12 ENCOUNTER — Other Ambulatory Visit (INDEPENDENT_AMBULATORY_CARE_PROVIDER_SITE_OTHER): Payer: Medicare Other

## 2021-07-12 DIAGNOSIS — S0003XA Contusion of scalp, initial encounter: Secondary | ICD-10-CM | POA: Diagnosis not present

## 2021-07-12 DIAGNOSIS — R2689 Other abnormalities of gait and mobility: Secondary | ICD-10-CM | POA: Diagnosis not present

## 2021-07-12 DIAGNOSIS — E871 Hypo-osmolality and hyponatremia: Secondary | ICD-10-CM

## 2021-07-12 DIAGNOSIS — D649 Anemia, unspecified: Secondary | ICD-10-CM | POA: Diagnosis not present

## 2021-07-12 DIAGNOSIS — E538 Deficiency of other specified B group vitamins: Secondary | ICD-10-CM | POA: Diagnosis not present

## 2021-07-12 DIAGNOSIS — R296 Repeated falls: Secondary | ICD-10-CM | POA: Insufficient documentation

## 2021-07-12 LAB — COMPREHENSIVE METABOLIC PANEL
ALT: 9 U/L (ref 0–35)
AST: 19 U/L (ref 0–37)
Albumin: 4.1 g/dL (ref 3.5–5.2)
Alkaline Phosphatase: 35 U/L — ABNORMAL LOW (ref 39–117)
BUN: 12 mg/dL (ref 6–23)
CO2: 32 mEq/L (ref 19–32)
Calcium: 9.9 mg/dL (ref 8.4–10.5)
Chloride: 97 mEq/L (ref 96–112)
Creatinine, Ser: 0.94 mg/dL (ref 0.40–1.20)
GFR: 55 mL/min — ABNORMAL LOW (ref >60.00)
Glucose, Bld: 93 mg/dL (ref 70–99)
Potassium: 3.9 mEq/L (ref 3.5–5.1)
Sodium: 135 mEq/L (ref 135–145)
Total Bilirubin: 0.8 mg/dL (ref 0.2–1.2)
Total Protein: 6.8 g/dL (ref 6.0–8.3)

## 2021-07-12 LAB — CBC WITH DIFFERENTIAL/PLATELET
Basophils Absolute: 0 10*3/uL (ref 0.0–0.1)
Basophils Relative: 0.9 % (ref 0.0–3.0)
Eosinophils Absolute: 0.1 10*3/uL (ref 0.0–0.7)
Eosinophils Relative: 2.6 % (ref 0.0–5.0)
HCT: 33.8 % — ABNORMAL LOW (ref 36.0–46.0)
Hemoglobin: 11.6 g/dL — ABNORMAL LOW (ref 12.0–15.0)
Lymphocytes Relative: 36.3 % (ref 12.0–46.0)
Lymphs Abs: 1.4 10*3/uL (ref 0.7–4.0)
MCHC: 34.2 g/dL (ref 30.0–36.0)
MCV: 96.8 fl (ref 78.0–100.0)
Monocytes Absolute: 0.4 10*3/uL (ref 0.1–1.0)
Monocytes Relative: 10.8 % (ref 3.0–12.0)
Neutro Abs: 1.9 10*3/uL (ref 1.4–7.7)
Neutrophils Relative %: 49.4 % (ref 43.0–77.0)
Platelets: 262 10*3/uL (ref 150.0–400.0)
RBC: 3.49 Mil/uL — ABNORMAL LOW (ref 3.87–5.11)
RDW: 15.5 % (ref 11.5–15.5)
WBC: 3.9 10*3/uL — ABNORMAL LOW (ref 4.0–10.5)

## 2021-07-12 LAB — VITAMIN B12: Vitamin B-12: 225 pg/mL (ref 211–911)

## 2021-07-19 ENCOUNTER — Encounter (HOSPITAL_BASED_OUTPATIENT_CLINIC_OR_DEPARTMENT_OTHER): Payer: Self-pay | Admitting: Emergency Medicine

## 2021-07-19 ENCOUNTER — Emergency Department (HOSPITAL_BASED_OUTPATIENT_CLINIC_OR_DEPARTMENT_OTHER)
Admission: EM | Admit: 2021-07-19 | Discharge: 2021-07-19 | Disposition: A | Discharge status: Home / Self Care | Payer: Medicare Other | Attending: Emergency Medicine | Admitting: Emergency Medicine

## 2021-07-19 ENCOUNTER — Other Ambulatory Visit: Payer: Self-pay

## 2021-07-19 ENCOUNTER — Emergency Department (HOSPITAL_BASED_OUTPATIENT_CLINIC_OR_DEPARTMENT_OTHER): Payer: Medicare Other

## 2021-07-19 DIAGNOSIS — Z7982 Long term (current) use of aspirin: Secondary | ICD-10-CM | POA: Insufficient documentation

## 2021-07-19 DIAGNOSIS — S0990XA Unspecified injury of head, initial encounter: Secondary | ICD-10-CM | POA: Diagnosis not present

## 2021-07-19 DIAGNOSIS — Y92002 Bathroom of unspecified non-institutional (private) residence single-family (private) house as the place of occurrence of the external cause: Secondary | ICD-10-CM | POA: Diagnosis not present

## 2021-07-19 DIAGNOSIS — W19XXXA Unspecified fall, initial encounter: Secondary | ICD-10-CM

## 2021-07-19 DIAGNOSIS — S199XXA Unspecified injury of neck, initial encounter: Secondary | ICD-10-CM | POA: Diagnosis not present

## 2021-07-19 DIAGNOSIS — W01198A Fall on same level from slipping, tripping and stumbling with subsequent striking against other object, initial encounter: Secondary | ICD-10-CM | POA: Insufficient documentation

## 2021-07-19 DIAGNOSIS — M542 Cervicalgia: Secondary | ICD-10-CM | POA: Diagnosis not present

## 2021-07-19 DIAGNOSIS — R519 Headache, unspecified: Secondary | ICD-10-CM | POA: Diagnosis not present

## 2021-07-19 NOTE — ED Provider Notes (Signed)
?[facility]PT ?Provider Note ? ? ?CSN: [id number] ?Arrival date & time: 07/19/21  1045 ? ?  ? ?History ? ?Chief Complaint  ?Patient presents with  ? Fall  ? ? ?Felicia Cross is a 88 y.o. female. ? ?Patient presents with chief complaint of fall, with headache and neck pain.  She states that she was in the bathroom yesterday afternoon when she lost balance and fell backwards and hit her head against the wall.  She denies loss of consciousness.  She denies any fevers or cough.  No reports of vomiting or diarrhea. ? ? ?  ? ?Home Medications ?Prior to Admission medications   ?Medication Sig Start Date End Date Taking? Authorizing Provider  ?aspirin EC 81 MG tablet Take 1 tablet (81 mg total) by mouth daily. 08/18/16   Terressa KoyanagiKim, Hannah R, DO  ?cyanocobalamin 500 MCG tablet Take 500 mcg by mouth daily.    [provider]  ?dimenhyDRINATE (DRAMAMINE PO) Take by mouth. As needed for vertigo    [provider]  ?gabapentin (NEURONTIN) 100 MG capsule Take 1 capsule by mouth at bedtime 02/22/21   Deeann SaintBanks, Shannon R, MD  ?hydrochlorothiazide (HYDRODIURIL) 25 MG tablet Take 1 tablet by mouth once daily 04/28/21   Deeann SaintBanks, Shannon R, MD  ?losartan (COZAAR) 100 MG tablet Take 1 tablet by mouth once daily 09/28/20   Lewayne Buntingrenshaw, Brian S, MD  ?metoprolol tartrate (LOPRESSOR) 25 MG tablet Take 1 tablet by mouth twice daily 04/28/21   Lewayne Buntingrenshaw, Brian S, MD  ?Multiple Vitamin (MULTIVITAMIN WITH MINERALS) TABS Take 1 tablet by mouth daily.     [provider]  ?simvastatin (ZOCOR) 40 MG tablet Take 1 tablet by mouth once daily 08/17/20   Lewayne Buntingrenshaw, Brian S, MD  ?   ? ?Allergies    ?Codeine and Hydrocodone   ? ?Review of Systems   ?Review of Systems  ?Constitutional:  Negative for fever.  ?HENT:  Negative for ear pain.   ?Eyes:  Negative for pain.  ?Respiratory:  Negative for cough.   ?Cardiovascular:  Negative for chest pain.  ?Gastrointestinal:  Negative for abdominal pain.  ?Genitourinary:   Negative for flank pain.  ?Musculoskeletal:  Negative for back pain.  ?Skin:  Negative for rash.  ?Neurological:  Positive for headaches.  ? ?Physical Exam ?Updated Vital Signs ?BP (!) 173/92   Pulse 62   Temp 97.9 ?F (36.6 ?C) (Oral)   Resp 18   Ht 5\' 4"  (1.626 m)   Wt 69.9 kg   SpO2 99%   BMI 26.45 kg/m?  ?Physical Exam ?Constitutional:   ?   General: She is not in acute distress. ?   Appearance: Normal appearance.  ?HENT:  ?   Head: Normocephalic.  ?   Nose: Nose normal.  ?Eyes:  ?   Extraocular Movements: Extraocular movements intact.  ?Cardiovascular:  ?   Rate and Rhythm: Normal rate.  ?Pulmonary:  ?   Effort: Pulmonary effort is normal.  ?Musculoskeletal:     ?   General: Normal range of motion.  ?   Cervical back: Normal range of motion.  ?   Comments: Tenderness into the occipital region.  No gross deformity noted no laceration noted.  Mild tenderness in the C3-4 midline region no step-offs noted.  No T-spine or L-spine tenderness noted. ? ?Otherwise normal full range of motion of bilateral shoulders elbows wrists and knees hips and ankles.  ?Neurological:  ?   General: No focal deficit present.  ?  Mental Status: She is alert and oriented to person, place, and time. Mental status is at baseline.  ?   Cranial Nerves: No cranial nerve deficit.  ?   Motor: No weakness.  ? ? ?ED Results / Procedures / Treatments   ?Labs ?(all labs ordered are listed, but only abnormal results are displayed) ?Labs Reviewed - No data to display ? ?EKG ?None ? ?Radiology ?CT Head Wo Contrast ? ?Result Date: 07/19/2021 ?CLINICAL DATA:  Head trauma, moderate-severe EXAM: CT HEAD WITHOUT CONTRAST TECHNIQUE: Contiguous axial images were obtained from the base of the skull through the vertex without intravenous contrast. RADIATION DOSE REDUCTION: This exam was performed according to the departmental dose-optimization program which includes automated exposure control, adjustment of the mA and/or kV according to patient size  and/or use of iterative reconstruction technique. COMPARISON:  07/12/2021 FINDINGS: Brain: There is no acute intracranial hemorrhage, mass effect, or edema. Gray-white differentiation is preserved. There is no extra-axial fluid collection. Ventricles and sulci are stable in size and configuration. Stable probable mild chronic microvascular ischemic changes in the cerebral white matter. Vascular: There is atherosclerotic calcification at the skull base. Skull: Calvarium is unremarkable. Sinuses/Orbits: No acute finding. Other: None. IMPRESSION: No evidence of acute intracranial injury. Electronically Signed   By: Guadlupe Spanish M.D.   On: 07/19/2021 12:20  ? ?CT Cervical Spine Wo Contrast ? ?Result Date: 07/19/2021 ?CLINICAL DATA:  Neck trauma (Age >= 65y) EXAM: CT CERVICAL SPINE WITHOUT CONTRAST TECHNIQUE: Multidetector CT imaging of the cervical spine was performed without intravenous contrast. Multiplanar CT image reconstructions were also generated. RADIATION DOSE REDUCTION: This exam was performed according to the departmental dose-optimization program which includes automated exposure control, adjustment of the mA and/or kV according to patient size and/or use of iterative reconstruction technique. COMPARISON:  April 2022 FINDINGS: Alignment: Stable including anterolisthesis at C7-T1. Skull base and vertebrae: Vertebral body heights are similar. There is degenerative endplate irregularity. No acute fracture. Soft tissues and spinal canal: No prevertebral fluid or swelling. No visible canal hematoma. Disc levels: Multilevel degenerative changes are present including disc space narrowing, endplate osteophytes, and facet and uncovertebral hypertrophy. Appearance is similar to the prior study. Upper chest: No apical lung mass. Other: Calcification at the common carotid bifurcations. IMPRESSION: No acute cervical spine fracture. Electronically Signed   By: Guadlupe Spanish M.D.   On: 07/19/2021 12:07    ? ?Procedures ?Procedures  ? ? ?Medications Ordered in ED ?Medications - No data to display ? ?ED Course/ Medical Decision Making/ A&P ?  ?                        ?Medical Decision Making ?Amount and/or Complexity of Data Reviewed ?Radiology: ordered. ? ? ?Patient on cardiac monitor, has sinus rhythm normal rate. ? ?History obtained from the patient and daughter at bedside. ? ?Differential for patient work-up included skull fracture, intracranial hemorrhage, contusion, concussion, other ? ?Work-up included CT imaging these are unremarkable for acute fracture or acute evidence of trauma. ? ?Patient discharged home in stable condition advised Tylenol as needed at home.  Advised follow-up with her primary care doctor within the week. ? ?Advised immediate return for worsening symptoms worsening pain fevers or any additional concerns. ? ? ? ? ? ? ? ?Final Clinical Impression(s) / ED Diagnoses ?Final diagnoses:  ?Fall, initial encounter  ? ? ?Rx / DC Orders ?ED Discharge Orders   ? ? None  ? ?  ? ? ?  ?Numa,  Eustace Moore, MD ?07/19/21 1233 ? ?

## 2021-07-19 NOTE — ED Notes (Signed)
RN provided AVS using Teachback Method. Patient verbalizes understanding of Discharge Instructions. Opportunity for Questioning and Answers were provided by RN. Patient Discharged from ED in wheelchair to Home with Family. ? ?

## 2021-07-19 NOTE — Discharge Instructions (Addendum)
Your CT scans did not show any bleeding or fracture. ? ?Continue Tylenol at home as needed.  Follow-up with your doctor this week. ? ?Return back to the ER if your symptoms worsen or if you have any additional concerns. ?

## 2021-07-19 NOTE — ED Triage Notes (Signed)
Pt arrives to ED with c/o fall. Pt reports she fell in the bathroom yesterday afternoon. She reports she backwards and hit the back of her head on the wall. Pt reports it was a mechanical fall, she was turning around and tripped. She reports that her head hurts today.  ?

## 2021-07-19 NOTE — ED Notes (Signed)
Patient transported to CT 

## 2021-07-21 ENCOUNTER — Telehealth: Payer: Self-pay | Admitting: Family Medicine

## 2021-07-21 NOTE — Telephone Encounter (Signed)
Pt caregiver taylor  is calling and would like dr banks to order in home physical therapy due to she has fallen /unsteady gait ?

## 2021-07-22 NOTE — Telephone Encounter (Signed)
A referral was already placed for this at time of last [facility] on 07/05/21. ?

## 2021-07-23 ENCOUNTER — Emergency Department (HOSPITAL_COMMUNITY): Payer: Medicare Other

## 2021-07-23 ENCOUNTER — Inpatient Hospital Stay (HOSPITAL_COMMUNITY)
Admission: EM | Admit: 2021-07-23 | Discharge: 2021-07-27 | DRG: 083 | Disposition: A | Discharge status: Skilled Nursing Facility | Payer: Medicare Other | Attending: Internal Medicine | Admitting: Internal Medicine

## 2021-07-23 DIAGNOSIS — R252 Cramp and spasm: Secondary | ICD-10-CM | POA: Diagnosis present

## 2021-07-23 DIAGNOSIS — S0003XA Contusion of scalp, initial encounter: Secondary | ICD-10-CM | POA: Diagnosis not present

## 2021-07-23 DIAGNOSIS — Z885 Allergy status to narcotic agent status: Secondary | ICD-10-CM | POA: Diagnosis not present

## 2021-07-23 DIAGNOSIS — E538 Deficiency of other specified B group vitamins: Secondary | ICD-10-CM | POA: Diagnosis not present

## 2021-07-23 DIAGNOSIS — S06359A Traumatic hemorrhage of left cerebrum with loss of consciousness of unspecified duration, initial encounter: Principal | ICD-10-CM | POA: Diagnosis present

## 2021-07-23 DIAGNOSIS — Z79899 Other long term (current) drug therapy: Secondary | ICD-10-CM | POA: Diagnosis not present

## 2021-07-23 DIAGNOSIS — R402242 Coma scale, best verbal response, confused conversation, at arrival to emergency department: Secondary | ICD-10-CM | POA: Diagnosis present

## 2021-07-23 DIAGNOSIS — S0093XA Contusion of unspecified part of head, initial encounter: Principal | ICD-10-CM

## 2021-07-23 DIAGNOSIS — R404 Transient alteration of awareness: Secondary | ICD-10-CM | POA: Diagnosis not present

## 2021-07-23 DIAGNOSIS — R2689 Other abnormalities of gait and mobility: Secondary | ICD-10-CM | POA: Diagnosis not present

## 2021-07-23 DIAGNOSIS — R402142 Coma scale, eyes open, spontaneous, at arrival to emergency department: Secondary | ICD-10-CM | POA: Diagnosis not present

## 2021-07-23 DIAGNOSIS — E871 Hypo-osmolality and hyponatremia: Secondary | ICD-10-CM | POA: Diagnosis not present

## 2021-07-23 DIAGNOSIS — W19XXXA Unspecified fall, initial encounter: Secondary | ICD-10-CM | POA: Diagnosis not present

## 2021-07-23 DIAGNOSIS — R55 Syncope and collapse: Secondary | ICD-10-CM | POA: Diagnosis present

## 2021-07-23 DIAGNOSIS — I2510 Atherosclerotic heart disease of native coronary artery without angina pectoris: Secondary | ICD-10-CM | POA: Diagnosis not present

## 2021-07-23 DIAGNOSIS — R1319 Other dysphagia: Secondary | ICD-10-CM | POA: Diagnosis not present

## 2021-07-23 DIAGNOSIS — E785 Hyperlipidemia, unspecified: Secondary | ICD-10-CM | POA: Diagnosis present

## 2021-07-23 DIAGNOSIS — Z7401 Bed confinement status: Secondary | ICD-10-CM | POA: Diagnosis not present

## 2021-07-23 DIAGNOSIS — R569 Unspecified convulsions: Secondary | ICD-10-CM

## 2021-07-23 DIAGNOSIS — S299XXA Unspecified injury of thorax, initial encounter: Secondary | ICD-10-CM | POA: Diagnosis not present

## 2021-07-23 DIAGNOSIS — S199XXA Unspecified injury of neck, initial encounter: Secondary | ICD-10-CM | POA: Diagnosis not present

## 2021-07-23 DIAGNOSIS — M6259 Muscle wasting and atrophy, not elsewhere classified, multiple sites: Secondary | ICD-10-CM | POA: Diagnosis not present

## 2021-07-23 DIAGNOSIS — Z7982 Long term (current) use of aspirin: Secondary | ICD-10-CM | POA: Diagnosis not present

## 2021-07-23 DIAGNOSIS — R6889 Other general symptoms and signs: Secondary | ICD-10-CM | POA: Diagnosis not present

## 2021-07-23 DIAGNOSIS — T502X5A Adverse effect of carbonic-anhydrase inhibitors, benzothiadiazides and other diuretics, initial encounter: Secondary | ICD-10-CM | POA: Diagnosis not present

## 2021-07-23 DIAGNOSIS — Z743 Need for continuous supervision: Secondary | ICD-10-CM | POA: Diagnosis not present

## 2021-07-23 DIAGNOSIS — I10 Essential (primary) hypertension: Secondary | ICD-10-CM | POA: Diagnosis not present

## 2021-07-23 DIAGNOSIS — R531 Weakness: Secondary | ICD-10-CM | POA: Diagnosis not present

## 2021-07-23 DIAGNOSIS — Z96641 Presence of right artificial hip joint: Secondary | ICD-10-CM | POA: Diagnosis not present

## 2021-07-23 DIAGNOSIS — S0083XA Contusion of other part of head, initial encounter: Secondary | ICD-10-CM | POA: Diagnosis not present

## 2021-07-23 DIAGNOSIS — S3993XA Unspecified injury of pelvis, initial encounter: Secondary | ICD-10-CM | POA: Diagnosis not present

## 2021-07-23 DIAGNOSIS — M4313 Spondylolisthesis, cervicothoracic region: Secondary | ICD-10-CM | POA: Diagnosis not present

## 2021-07-23 DIAGNOSIS — S06360D Traumatic hemorrhage of cerebrum, unspecified, without loss of consciousness, subsequent encounter: Secondary | ICD-10-CM | POA: Diagnosis not present

## 2021-07-23 DIAGNOSIS — Z87891 Personal history of nicotine dependence: Secondary | ICD-10-CM | POA: Diagnosis not present

## 2021-07-23 DIAGNOSIS — Z66 Do not resuscitate: Secondary | ICD-10-CM | POA: Diagnosis present

## 2021-07-23 DIAGNOSIS — E7800 Pure hypercholesterolemia, unspecified: Secondary | ICD-10-CM | POA: Diagnosis not present

## 2021-07-23 DIAGNOSIS — I700 Atherosclerosis of aorta: Secondary | ICD-10-CM | POA: Diagnosis not present

## 2021-07-23 DIAGNOSIS — S060X9A Concussion with loss of consciousness of unspecified duration, initial encounter: Secondary | ICD-10-CM | POA: Diagnosis present

## 2021-07-23 DIAGNOSIS — Y92000 Kitchen of unspecified non-institutional (private) residence as  the place of occurrence of the external cause: Secondary | ICD-10-CM

## 2021-07-23 DIAGNOSIS — Z8249 Family history of ischemic heart disease and other diseases of the circulatory system: Secondary | ICD-10-CM

## 2021-07-23 DIAGNOSIS — Z96653 Presence of artificial knee joint, bilateral: Secondary | ICD-10-CM | POA: Diagnosis not present

## 2021-07-23 DIAGNOSIS — M6281 Muscle weakness (generalized): Secondary | ICD-10-CM | POA: Diagnosis not present

## 2021-07-23 DIAGNOSIS — R402362 Coma scale, best motor response, obeys commands, at arrival to emergency department: Secondary | ICD-10-CM | POA: Diagnosis present

## 2021-07-23 DIAGNOSIS — M5030 Other cervical disc degeneration, unspecified cervical region: Secondary | ICD-10-CM | POA: Diagnosis not present

## 2021-07-23 DIAGNOSIS — Z20822 Contact with and (suspected) exposure to covid-19: Secondary | ICD-10-CM | POA: Diagnosis not present

## 2021-07-23 DIAGNOSIS — R58 Hemorrhage, not elsewhere classified: Secondary | ICD-10-CM | POA: Diagnosis not present

## 2021-07-23 DIAGNOSIS — M19011 Primary osteoarthritis, right shoulder: Secondary | ICD-10-CM | POA: Diagnosis not present

## 2021-07-23 DIAGNOSIS — M47812 Spondylosis without myelopathy or radiculopathy, cervical region: Secondary | ICD-10-CM | POA: Diagnosis not present

## 2021-07-23 DIAGNOSIS — I951 Orthostatic hypotension: Secondary | ICD-10-CM | POA: Diagnosis present

## 2021-07-23 DIAGNOSIS — M19012 Primary osteoarthritis, left shoulder: Secondary | ICD-10-CM | POA: Diagnosis not present

## 2021-07-23 LAB — CBC WITH DIFFERENTIAL/PLATELET
Abs Immature Granulocytes: 0.01 10*3/uL (ref 0.00–0.07)
Basophils Absolute: 0.1 10*3/uL (ref 0.0–0.1)
Basophils Relative: 1 %
Eosinophils Absolute: 0.2 10*3/uL (ref 0.0–0.5)
Eosinophils Relative: 3 %
HCT: 36.1 % (ref 36.0–46.0)
Hemoglobin: 11.6 g/dL — ABNORMAL LOW (ref 12.0–15.0)
Immature Granulocytes: 0 %
Lymphocytes Relative: 43 %
Lymphs Abs: 2.5 10*3/uL (ref 0.7–4.0)
MCH: 31.2 pg (ref 26.0–34.0)
MCHC: 32.1 g/dL (ref 30.0–36.0)
MCV: 97 fL (ref 80.0–100.0)
Monocytes Absolute: 0.6 10*3/uL (ref 0.1–1.0)
Monocytes Relative: 10 %
Neutro Abs: 2.5 10*3/uL (ref 1.7–7.7)
Neutrophils Relative %: 43 %
Platelets: 255 10*3/uL (ref 150–400)
RBC: 3.72 MIL/uL — ABNORMAL LOW (ref 3.87–5.11)
RDW: 14.5 % (ref 11.5–15.5)
WBC: 5.9 10*3/uL (ref 4.0–10.5)
nRBC: 0 % (ref 0.0–0.2)

## 2021-07-23 LAB — URINALYSIS, ROUTINE W REFLEX MICROSCOPIC
Bilirubin Urine: NEGATIVE
Glucose, UA: NEGATIVE mg/dL
Hgb urine dipstick: NEGATIVE
Ketones, ur: NEGATIVE mg/dL
Leukocytes,Ua: NEGATIVE
Nitrite: NEGATIVE
Protein, ur: NEGATIVE mg/dL
Specific Gravity, Urine: 1.012 (ref 1.005–1.030)
pH: 7 (ref 5.0–8.0)

## 2021-07-23 LAB — BASIC METABOLIC PANEL
Anion gap: 12 (ref 5–15)
BUN: 10 mg/dL (ref 8–23)
CO2: 24 mmol/L (ref 22–32)
Calcium: 9.6 mg/dL (ref 8.9–10.3)
Chloride: 94 mmol/L — ABNORMAL LOW (ref 98–111)
Creatinine, Ser: 0.86 mg/dL (ref 0.44–1.00)
GFR, Estimated: >60 mL/min (ref >60)
Glucose, Bld: 94 mg/dL (ref 70–99)
Potassium: 4.5 mmol/L (ref 3.5–5.1)
Sodium: 130 mmol/L — ABNORMAL LOW (ref 135–145)

## 2021-07-23 LAB — HEPATIC FUNCTION PANEL
ALT: 10 U/L (ref 0–44)
AST: 23 U/L (ref 15–41)
Albumin: 3.4 g/dL — ABNORMAL LOW (ref 3.5–5.0)
Alkaline Phosphatase: 33 U/L — ABNORMAL LOW (ref 38–126)
Bilirubin, Direct: 0.1 mg/dL (ref 0.0–0.2)
Indirect Bilirubin: 0.6 mg/dL (ref 0.3–0.9)
Total Bilirubin: 0.7 mg/dL (ref 0.3–1.2)
Total Protein: 5.9 g/dL — ABNORMAL LOW (ref 6.5–8.1)

## 2021-07-23 LAB — RESP PANEL BY RT-PCR (FLU A&B, COVID) ARPGX2
Influenza A by PCR: NEGATIVE
Influenza B by PCR: NEGATIVE
SARS Coronavirus 2 by RT PCR: NEGATIVE

## 2021-07-23 LAB — TROPONIN I (HIGH SENSITIVITY): Troponin I (High Sensitivity): 10 ng/L (ref <18)

## 2021-07-23 MED ORDER — LORAZEPAM 2 MG/ML IJ SOLN: 1.0000 mg | Freq: Once | INTRAMUSCULAR | Status: DC

## 2021-07-23 MED ORDER — SODIUM CHLORIDE 0.9 % IV BOLUS
1000.0000 mL | Freq: Once | INTRAVENOUS | Status: AC
Start: 2021-07-23 — End: 2021-07-24
  Administered 2021-07-23: 1000 mL via INTRAVENOUS

## 2021-07-23 MED ORDER — LORAZEPAM 2 MG/ML IJ SOLN
1.0000 mg | Freq: Once | INTRAMUSCULAR | Status: AC
  Administered 2021-07-23: 1 mg via INTRAMUSCULAR
  Filled 2021-07-23: qty 1

## 2021-07-23 MED ORDER — LEVETIRACETAM IN NACL 1000 MG/100ML IV SOLN
1000.0000 mg | Freq: Once | INTRAVENOUS | Status: AC
  Administered 2021-07-23: 1000 mg via INTRAVENOUS
  Filled 2021-07-23: qty 100

## 2021-07-23 NOTE — ED Provider Notes (Signed)
?[facility] ?Provider Note ? ? ?CSN: [id number] ?Arrival date & time: 07/23/21  1959 ? ?  ? ?History ? ?Chief Complaint  ?Patient presents with  ? Fall  ? ? ?Felicia Cross is a 88 y.o. female. ? ?Found in her kitchen next to her walker after a fall.  Large hematoma to the left side of her forehead per EMS.  She is not on baseline.  It appeared that she tripped over something in the kitchen per EMS as there was a lot of things on the floor.  Patient has had confusion in route.  Has no history of dementia.  She is not complaining of any extremity pain.  She cannot really recall the events of today.  She remembers watching TV.  Per the daughter, husband of the patient was at home and patient was in the kitchen when he heard a fall.  She denies any chest pain or shortness of breath.  Per the daughter there were no concerns about her health prior to this fall today.  He does have ambulation issues at baseline. ? ?The history is provided by the patient, the EMS personnel and a relative.  ?Fall ?This is a new problem. The current episode started less than 1 hour ago. The problem has been resolved. Associated symptoms include headaches. Pertinent negatives include no chest pain, no abdominal pain and no shortness of breath. Nothing aggravates the symptoms. Nothing relieves the symptoms. She has tried nothing for the symptoms. The treatment provided no relief.  ? ?  ? ?Home Medications ?Prior to Admission medications   ?Medication Sig Start Date End Date Taking? Authorizing Provider  ?acetaminophen (TYLENOL) 500 MG tablet Take 1,000 mg by mouth every 6 (six) hours as needed for moderate pain or headache.   Yes [provider]  ?aspirin EC 81 MG tablet Take 1 tablet (81 mg total) by mouth daily. 08/18/16  Yes Kriste BasqueKim, Hannah R, DO  ?dimenhyDRINATE (DRAMAMINE PO) Take 1 tablet by mouth daily as needed (dizziness). As needed for vertigo   Yes [provider]   ?hydrochlorothiazide (HYDRODIURIL) 25 MG tablet Take 1 tablet by mouth once daily ?Patient taking differently: Take 25 mg by mouth daily. 04/28/21  Yes Deeann SaintBanks, Shannon R, MD  ?losartan (COZAAR) 100 MG tablet Take 1 tablet by mouth once daily ?Patient taking differently: Take 100 mg by mouth daily. 09/28/20  Yes Lewayne Buntingrenshaw, Brian S, MD  ?metoprolol tartrate (LOPRESSOR) 25 MG tablet Take 1 tablet by mouth twice daily ?Patient taking differently: Take 25 mg by mouth 2 (two) times daily. 04/28/21  Yes Lewayne Buntingrenshaw, Brian S, MD  ?Multiple Vitamin (MULTIVITAMIN WITH MINERALS) TABS Take 1 tablet by mouth daily.    Yes [provider]  ?simvastatin (ZOCOR) 40 MG tablet Take 1 tablet by mouth once daily ?Patient taking differently: Take 40 mg by mouth daily. 08/17/20  Yes Lewayne Buntingrenshaw, Brian S, MD  ?gabapentin (NEURONTIN) 100 MG capsule Take 1 capsule by mouth at bedtime ?Patient not taking: Reported on 07/23/2021 02/22/21   Deeann SaintBanks, Shannon R, MD  ?   ? ?Allergies    ?Codeine and Hydrocodone   ? ?Review of Systems   ?Review of Systems  ?Respiratory:  Negative for shortness of breath.   ?Cardiovascular:  Negative for chest pain.  ?Gastrointestinal:  Negative for abdominal pain.  ?Neurological:  Positive for headaches.  ? ?Physical Exam ?Updated Vital Signs ?BP 132/71   Pulse 73   Temp 98.2 ?F (36.8 ?C) (Oral)  Resp 16   Ht 5\' 4"  (1.626 m)   Wt 69.9 kg   SpO2 100%   BMI 26.45 kg/m?  ?Physical Exam ?Vitals and nursing note reviewed.  ?Constitutional:   ?   General: She is not in acute distress. ?   Appearance: She is well-developed.  ?HENT:  ?   Head:  ?   Comments: Large left forehead hematoma ?Eyes:  ?   Conjunctiva/sclera: Conjunctivae normal.  ?Cardiovascular:  ?   Rate and Rhythm: Normal rate and regular rhythm.  ?   Pulses: Normal pulses.  ?   Heart sounds: Normal heart sounds. No murmur heard. ?Pulmonary:  ?   Effort: Pulmonary effort is normal. No respiratory distress.  ?   Breath sounds: Normal breath sounds.   ?Abdominal:  ?   Palpations: Abdomen is soft.  ?   Tenderness: There is no abdominal tenderness.  ?Musculoskeletal:     ?   General: No swelling or tenderness. Normal range of motion.  ?   Cervical back: Normal range of motion and neck supple. No tenderness.  ?   Comments: No midline spinal tenderness, no extremity tenderness  ?Skin: ?   General: Skin is warm and dry.  ?   Capillary Refill: Capillary refill takes less than 2 seconds.  ?Neurological:  ?   General: No focal deficit present.  ?   Mental Status: She is alert and oriented to person, place, and time.  ?   Cranial Nerves: No cranial nerve deficit.  ?   Sensory: No sensory deficit.  ?   Motor: No weakness.  ?   Coordination: Coordination normal.  ?   Comments: Patient has somewhat amnesia to the event but she knows her name where she is, 5+ out of 5 strength, has some normal sensation, no drift, normal finger-to-nose finger, normal speech, of note she did have some decreased range of motion of the elbow on the left  ?Psychiatric:     ?   Mood and Affect: Mood normal.  ? ? ?ED Results / Procedures / Treatments   ?Labs ?(all labs ordered are listed, but only abnormal results are displayed) ?Labs Reviewed  ?CBC WITH DIFFERENTIAL/PLATELET - Abnormal; Notable for the following components:  ?    Result Value  ? RBC 3.72 (*)   ? Hemoglobin 11.6 (*)   ? All other components within normal limits  ?BASIC METABOLIC PANEL - Abnormal; Notable for the following components:  ? Sodium 130 (*)   ? Chloride 94 (*)   ? All other components within normal limits  ?HEPATIC FUNCTION PANEL - Abnormal; Notable for the following components:  ? Total Protein 5.9 (*)   ? Albumin 3.4 (*)   ? Alkaline Phosphatase 33 (*)   ? All other components within normal limits  ?RESP PANEL BY RT-PCR (FLU A&B, COVID) ARPGX2  ?URINALYSIS, ROUTINE W REFLEX MICROSCOPIC  ?TROPONIN I (HIGH SENSITIVITY)  ?TROPONIN I (HIGH SENSITIVITY)  ? ? ?EKG ?EKG Interpretation ? ?Date/Time:  Friday July 23 2021  20:19:39 EDT ?Ventricular Rate:  84 ?PR Interval:    ?QRS Duration: 97 ?QT Interval:  361 ?QTC Calculation: 427 ?R Axis:   10 ?Text Interpretation: Sinus rhythm , artificat Ventricular premature complex Baseline wander in lead(s) V4 V6 Confirmed by 01-04-1990, Jaquarious Grey ([id number]) on 07/23/2021 8:44:12 PM ? ?Radiology ?CT HEAD WO CONTRAST (07/25/2021) ? ?Result Date: 07/23/2021 ?CLINICAL DATA:  Fall, left forehead hematoma EXAM: CT HEAD WITHOUT CONTRAST CT CERVICAL SPINE WITHOUT CONTRAST TECHNIQUE: Multidetector CT imaging  of the head and cervical spine was performed following the standard protocol without intravenous contrast. Multiplanar CT image reconstructions of the cervical spine were also generated. RADIATION DOSE REDUCTION: This exam was performed according to the departmental dose-optimization program which includes automated exposure control, adjustment of the mA and/or kV according to patient size and/or use of iterative reconstruction technique. COMPARISON:  07/19/2021 FINDINGS: CT HEAD FINDINGS Brain: No evidence of acute infarction, hemorrhage, hydrocephalus, extra-axial collection or mass lesion/mass effect. Subcortical white matter and periventricular small vessel ischemic changes. Vascular: Intracranial atherosclerosis. Skull: Normal. Negative for fracture or focal lesion. Sinuses/Orbits: The visualized paranasal sinuses are essentially clear. The mastoid air cells are unopacified. Other: Moderate extracranial hematoma overlying the left frontal bone (series 3/image 15). CT CERVICAL SPINE FINDINGS Alignment: Straightening of the cervical spine. Skull base and vertebrae: No acute fracture. No primary bone lesion or focal pathologic process. Soft tissues and spinal canal: No prevertebral fluid or swelling. No visible canal hematoma. Disc levels: Mild to moderate degenerative changes of the mid cervical spine. Spinal canal is patent. Upper chest: Visualized lung apices are clear. Other: Visualized thyroid is unremarkable.  IMPRESSION: Moderate extracranial hematoma overlying the left frontal bone. No evidence of calvarial fracture. No evidence of acute intracranial abnormality. No evidence of traumatic injury to the cervical spine. Mild to moderate deg

## 2021-07-23 NOTE — ED Triage Notes (Signed)
Pt bib [facility] from home for mechanical fall in the kitchen. Pt hit head, unknown LOC. Only injury found is hematoma to Lt forehead. GCS 14, A&Ox3. Baseline lt side weakness. PMH of HTN. ? ?EMS vitals ?200/100 ?80 HR ?20 RR ?104 CBG ?98 O2 RA ?

## 2021-07-23 NOTE — Progress Notes (Signed)
Orthopedic Tech Progress Note ?Patient Details:  ?Felicia Cross ?May 12, 1932 ?[id number] ? ?Patient ID: Felicia Cross, female   DOB: [birth year], 88 y.o.   MRN: [id number] ?I attended trauma page. ?Trinna Post ?07/23/2021, 11:53 PM ? ?

## 2021-07-23 NOTE — H&P (Signed)
?History and Physical  ? ? ?Felicia Cross [id number] DOB: Sep 21, 1932 DOA: 07/23/2021 ? ?PCP: Billie Ruddy, MD  ?Patient coming from: Home via EMS ? ?I have personally briefly reviewed patient's old medical records in  ? ?Chief Complaint: Fall ? ?HPI: ?Felicia Cross is a 88 y.o. female with medical history significant for CAD, HTN, HLD who presented to the ED for evaluation after a fall at home.  History is limited from patient as she does not recall the events prior to arrival. ? ?Patient currently lives at home with her husband.  She normally ambulates with the use of a walker.  She remembers walking to the bathroom and back with her walker and then afterwards has no recollection of the events.  She was found on the floor with a large contusion over left forehead by her husband who was in the other room after he heard her fall.  She was seemingly confused after her fall and has been having slow speech since then.  While in the ED she developed tightening up of her left arm with spasms witnessed by her daughter.  Daughter states that she does have chronic issues at her left elbow limiting her mobility however this was a new issue. ? ?Patient otherwise denies any chest pain, palpitations, dyspnea, nausea, vomiting, abdominal pain, dysuria. ? ?ED Course  Labs/Imaging on admission: I have personally reviewed following labs and imaging studies. ? ?Initial vitals showed BP 130/90, pulse 71, RR 18, temp 98.2 ?F, SPO2 98% on room air. ? ?Labs show WBC 5.9, hemoglobin 11.6, platelets 255,000, troponin 10. ? ?SARS-CoV-2 and influenza PCR negative.  Urinalysis negative for UTI. ? ?Portable chest x-ray shows low lung volumes without focal consolidation, edema, effusion. ? ?Pelvic x-ray negative for acute osseous abnormality. ? ?CT head without contrast shows moderate extracranial hematoma overlying the left frontal bone without evidence of calvarial fracture.  No evidence of acute intracranial  abnormality. ? ?CT cervical spine without contrast negative for evidence of traumatic injury to the C-spine.  Mild to moderate degenerative changes noted. ? ?Patient was noted to have spasming and stiffness of the left upper extremity.  Patient was given IM Ativan 1 mg.  EDP discussed with on-call neurology who recommended loading with IV Keppra and will evaluate.  MRI brain and C-spine were ordered and pending.  The hospitalist service was consulted to admit for further evaluation and management. ? ?Review of Systems: All systems reviewed and are negative except as documented in history of present illness above. ? ? ?Past Medical History:  ?Diagnosis Date  ? Anemia 01/18/2016  ? -reports on and off her whole life, on oral iron in the past remotely per report  ? Arthritis   ? B12 deficiency 01/18/2016  ? CAD (coronary artery disease)   ? CVD (cerebrovascular disease)   ? Diverticulosis of colon   ? H. pylori infection 10/20/2015  ? Hyperlipidemia   ? Hypertension   ? Osteoarthritis 03/07/2006  ? Qualifier: Diagnosis of  By: Leanne Chang MD, Bruce    ? ? ?Past Surgical History:  ?Procedure Laterality Date  ? ABDOMINAL HYSTERECTOMY  1984  ? REPLACEMENT TOTAL KNEE  2007  ? left  ? TOTAL HIP ARTHROPLASTY  2010  ? Dr. Mayer Camel, right  ? TOTAL KNEE ARTHROPLASTY Right 09/14/2015  ? Procedure: TOTAL KNEE ARTHROPLASTY;  Surgeon: Vickey Huger, MD;  Location: [place];  Service: Orthopedics;  Laterality: Right;  ? ? ?Social History: ? reports that she quit smoking about  40 years ago. Her smoking use included cigarettes. She has a 30.00 pack-year smoking history. She has never used smokeless tobacco. She reports current alcohol use. She reports that she does not use drugs. ? ?Allergies  ?Allergen Reactions  ? Codeine Nausea And Vomiting  ? Hydrocodone Nausea And Vomiting  ? ? ?Family History  ?Problem Relation Age of Onset  ? Diabetes Mother   ? Hypertension Father   ? Kidney disease Father   ? Heart attack Sister   ? Heart attack Brother   ?  Colon cancer Neg Hx   ? Stomach cancer Neg Hx   ? Esophageal cancer Neg Hx   ? Rectal cancer Neg Hx   ? ? ? ?Prior to Admission medications   ?Medication Sig Start Date End Date Taking? Authorizing Provider  ?acetaminophen (TYLENOL) 500 MG tablet Take 1,000 mg by mouth every 6 (six) hours as needed for moderate pain or headache.   Yes [provider]  ?aspirin EC 81 MG tablet Take 1 tablet (81 mg total) by mouth daily. 08/18/16  Yes Colin Benton R, DO  ?dimenhyDRINATE (DRAMAMINE PO) Take 1 tablet by mouth daily as needed (dizziness). As needed for vertigo   Yes [provider]  ?hydrochlorothiazide (HYDRODIURIL) 25 MG tablet Take 1 tablet by mouth once daily ?Patient taking differently: Take 25 mg by mouth daily. 04/28/21  Yes Billie Ruddy, MD  ?losartan (COZAAR) 100 MG tablet Take 1 tablet by mouth once daily ?Patient taking differently: Take 100 mg by mouth daily. 09/28/20  Yes Lelon Perla, MD  ?metoprolol tartrate (LOPRESSOR) 25 MG tablet Take 1 tablet by mouth twice daily ?Patient taking differently: Take 25 mg by mouth 2 (two) times daily. 04/28/21  Yes Lelon Perla, MD  ?Multiple Vitamin (MULTIVITAMIN WITH MINERALS) TABS Take 1 tablet by mouth daily.    Yes [provider]  ?simvastatin (ZOCOR) 40 MG tablet Take 1 tablet by mouth once daily ?Patient taking differently: Take 40 mg by mouth daily. 08/17/20  Yes Lelon Perla, MD  ?gabapentin (NEURONTIN) 100 MG capsule Take 1 capsule by mouth at bedtime ?Patient not taking: Reported on 07/23/2021 02/22/21   Billie Ruddy, MD  ? ? ?Physical Exam: ?Vitals:  ? 07/23/21 2200 07/23/21 2215 07/23/21 2230 07/23/21 2245  ?BP: (!) 159/86 (!) 162/76 (!) 142/77 132/71  ?Pulse: 71 71 69 73  ?Resp: 11 17 15 16   ?Temp:      ?TempSrc:      ?SpO2: 100% 100% 100% 100%  ?Weight:      ?Height:      ? ?Constitutional: Resting supine in bed, tired but in NAD, calm, comfortable ?Eyes: PERRL, EOMI, lids and conjunctivae normal ?Head: Large  hematoma left forehead without open wound ?ENMT: Mucous membranes are moist. Posterior pharynx clear of any exudate or lesions.Normal dentition.  ?Neck: Neck collar in place.  Normal, supple, no masses. ?Respiratory: clear to auscultation bilaterally, no wheezing, no crackles. Normal respiratory effort. No accessory muscle use.  ?Cardiovascular: Regular rate and rhythm, no murmurs / rubs / gallops. No extremity edema. 2+ pedal pulses. ?Abdomen: no tenderness, no masses palpated.  ?Musculoskeletal: no clubbing / cyanosis. No joint deformity upper and lower extremities. Good ROM, no contractures. Normal muscle tone.  ?Skin: no rashes, lesions, ulcers. No induration ?Neurologic: Speech is slow but otherwise CN 2-12 grossly intact. Sensation intact. Strength 5/5 in all 4.  ?Psychiatric: Alert and oriented x 3. Normal mood.  ? ?EKG: Personally reviewed. Sinus rhythm with  PVC, motion artifact. PVC new when compared to prior. ? ?Assessment/Plan ?Principal Problem: ?  Syncope ?Active Problems: ?  Hyponatremia ?  Essential hypertension ?  Coronary atherosclerosis ?  Traumatic hematoma of forehead ?  Dyslipidemia ?  ?Felicia Cross is a 88 y.o. female with medical history significant for CAD, HTN, HLD who is admitted for syncope evaluation. ? ?Assessment and Plan: ?* Syncope ?Left upper extremity spasm ?Patient with unwitnessed fall at home, presumed syncopal event as she does not recall the events.  Also concern for seizure-like activity due to left arm spasming/stiffness witnessed by EDP which resolved after IM Ativan. ?-S/p 1 g IV Keppra, neurology to follow ?-Follow MRI brain and C-spine ?-Keep on telemetry ?-Check orthostatic vitals ?-Obtain echocardiogram ?-PT/OT eval, fall precautions ?-IV fluid hydration overnight ? ?Hyponatremia ?Mild in the setting of HCTZ use which is on hold.  Started on gentle IV fluid hydration with normal saline overnight. ? ?Essential hypertension ?Holding antihypertensives for now pending  orthostatic vitals. ? ?Coronary atherosclerosis ?Stable, denies any chest pain.  Will hold aspirin for now in setting of large hematoma to left forehead. ? ?Traumatic hematoma of forehead ?Occurring after fall

## 2021-07-23 NOTE — ED Notes (Signed)
RN attempted to place PIV. Unsuccessful. Second RN unable to obtain access as well. IV consult put in. MD aware. ?

## 2021-07-23 NOTE — ED Notes (Signed)
Pt placed in [place] [facility] per [facility] MD ?

## 2021-07-24 ENCOUNTER — Observation Stay (HOSPITAL_COMMUNITY): Payer: Medicare Other

## 2021-07-24 ENCOUNTER — Other Ambulatory Visit: Payer: Self-pay

## 2021-07-24 ENCOUNTER — Encounter (HOSPITAL_COMMUNITY): Payer: Self-pay | Admitting: Internal Medicine

## 2021-07-24 DIAGNOSIS — S0083XA Contusion of other part of head, initial encounter: Secondary | ICD-10-CM

## 2021-07-24 DIAGNOSIS — W19XXXA Unspecified fall, initial encounter: Secondary | ICD-10-CM

## 2021-07-24 DIAGNOSIS — E871 Hypo-osmolality and hyponatremia: Secondary | ICD-10-CM | POA: Diagnosis present

## 2021-07-24 DIAGNOSIS — R55 Syncope and collapse: Secondary | ICD-10-CM | POA: Diagnosis not present

## 2021-07-24 DIAGNOSIS — Z96653 Presence of artificial knee joint, bilateral: Secondary | ICD-10-CM | POA: Diagnosis present

## 2021-07-24 DIAGNOSIS — Z96641 Presence of right artificial hip joint: Secondary | ICD-10-CM | POA: Diagnosis present

## 2021-07-24 DIAGNOSIS — R58 Hemorrhage, not elsewhere classified: Secondary | ICD-10-CM | POA: Diagnosis not present

## 2021-07-24 DIAGNOSIS — T502X5A Adverse effect of carbonic-anhydrase inhibitors, benzothiadiazides and other diuretics, initial encounter: Secondary | ICD-10-CM | POA: Diagnosis present

## 2021-07-24 DIAGNOSIS — Z20822 Contact with and (suspected) exposure to covid-19: Secondary | ICD-10-CM | POA: Diagnosis present

## 2021-07-24 DIAGNOSIS — R569 Unspecified convulsions: Secondary | ICD-10-CM

## 2021-07-24 DIAGNOSIS — Z7982 Long term (current) use of aspirin: Secondary | ICD-10-CM | POA: Diagnosis not present

## 2021-07-24 DIAGNOSIS — S060X9A Concussion with loss of consciousness of unspecified duration, initial encounter: Secondary | ICD-10-CM | POA: Diagnosis present

## 2021-07-24 DIAGNOSIS — Z66 Do not resuscitate: Secondary | ICD-10-CM | POA: Diagnosis present

## 2021-07-24 DIAGNOSIS — M5030 Other cervical disc degeneration, unspecified cervical region: Secondary | ICD-10-CM | POA: Diagnosis not present

## 2021-07-24 DIAGNOSIS — R402142 Coma scale, eyes open, spontaneous, at arrival to emergency department: Secondary | ICD-10-CM | POA: Diagnosis present

## 2021-07-24 DIAGNOSIS — Y92000 Kitchen of unspecified non-institutional (private) residence as  the place of occurrence of the external cause: Secondary | ICD-10-CM | POA: Diagnosis not present

## 2021-07-24 DIAGNOSIS — Z87891 Personal history of nicotine dependence: Secondary | ICD-10-CM | POA: Diagnosis not present

## 2021-07-24 DIAGNOSIS — R402242 Coma scale, best verbal response, confused conversation, at arrival to emergency department: Secondary | ICD-10-CM | POA: Diagnosis present

## 2021-07-24 DIAGNOSIS — S06359A Traumatic hemorrhage of left cerebrum with loss of consciousness of unspecified duration, initial encounter: Secondary | ICD-10-CM | POA: Diagnosis present

## 2021-07-24 DIAGNOSIS — I10 Essential (primary) hypertension: Secondary | ICD-10-CM | POA: Diagnosis present

## 2021-07-24 DIAGNOSIS — R402362 Coma scale, best motor response, obeys commands, at arrival to emergency department: Secondary | ICD-10-CM | POA: Diagnosis present

## 2021-07-24 DIAGNOSIS — Z8249 Family history of ischemic heart disease and other diseases of the circulatory system: Secondary | ICD-10-CM | POA: Diagnosis not present

## 2021-07-24 DIAGNOSIS — M4313 Spondylolisthesis, cervicothoracic region: Secondary | ICD-10-CM | POA: Diagnosis not present

## 2021-07-24 DIAGNOSIS — E7800 Pure hypercholesterolemia, unspecified: Secondary | ICD-10-CM | POA: Diagnosis present

## 2021-07-24 DIAGNOSIS — E785 Hyperlipidemia, unspecified: Secondary | ICD-10-CM | POA: Diagnosis not present

## 2021-07-24 DIAGNOSIS — E538 Deficiency of other specified B group vitamins: Secondary | ICD-10-CM | POA: Diagnosis present

## 2021-07-24 DIAGNOSIS — I2510 Atherosclerotic heart disease of native coronary artery without angina pectoris: Secondary | ICD-10-CM | POA: Diagnosis present

## 2021-07-24 DIAGNOSIS — I951 Orthostatic hypotension: Secondary | ICD-10-CM | POA: Diagnosis present

## 2021-07-24 DIAGNOSIS — Z79899 Other long term (current) drug therapy: Secondary | ICD-10-CM | POA: Diagnosis not present

## 2021-07-24 DIAGNOSIS — R252 Cramp and spasm: Secondary | ICD-10-CM | POA: Diagnosis present

## 2021-07-24 DIAGNOSIS — S0003XA Contusion of scalp, initial encounter: Secondary | ICD-10-CM | POA: Diagnosis not present

## 2021-07-24 DIAGNOSIS — Z885 Allergy status to narcotic agent status: Secondary | ICD-10-CM | POA: Diagnosis not present

## 2021-07-24 DIAGNOSIS — S199XXA Unspecified injury of neck, initial encounter: Secondary | ICD-10-CM | POA: Diagnosis not present

## 2021-07-24 LAB — CBC
HCT: 34.6 % — ABNORMAL LOW (ref 36.0–46.0)
Hemoglobin: 11.6 g/dL — ABNORMAL LOW (ref 12.0–15.0)
MCH: 30.9 pg (ref 26.0–34.0)
MCHC: 33.5 g/dL (ref 30.0–36.0)
MCV: 92.3 fL (ref 80.0–100.0)
Platelets: 254 10*3/uL (ref 150–400)
RBC: 3.75 MIL/uL — ABNORMAL LOW (ref 3.87–5.11)
RDW: 14.4 % (ref 11.5–15.5)
WBC: 7.4 10*3/uL (ref 4.0–10.5)
nRBC: 0 % (ref 0.0–0.2)

## 2021-07-24 LAB — BASIC METABOLIC PANEL
Anion gap: 7 (ref 5–15)
BUN: 7 mg/dL — ABNORMAL LOW (ref 8–23)
CO2: 28 mmol/L (ref 22–32)
Calcium: 9.5 mg/dL (ref 8.9–10.3)
Chloride: 99 mmol/L (ref 98–111)
Creatinine, Ser: 0.83 mg/dL (ref 0.44–1.00)
GFR, Estimated: >60 mL/min (ref >60)
Glucose, Bld: 88 mg/dL (ref 70–99)
Potassium: 3.7 mmol/L (ref 3.5–5.1)
Sodium: 134 mmol/L — ABNORMAL LOW (ref 135–145)

## 2021-07-24 LAB — ECHOCARDIOGRAM COMPLETE
AR max vel: 2.1 cm2
AV Area VTI: 2.08 cm2
AV Area mean vel: 1.81 cm2
AV Mean grad: 4 mmHg
AV Peak grad: 7.1 mmHg
Ao pk vel: 1.33 m/s
Area-P 1/2: 2.44 cm2
Height: 64 in
S' Lateral: 2.2 cm
Weight: 2500.9 oz

## 2021-07-24 LAB — TROPONIN I (HIGH SENSITIVITY): Troponin I (High Sensitivity): 13 ng/L (ref <18)

## 2021-07-24 LAB — GLUCOSE, CAPILLARY
Glucose-Capillary: 83 mg/dL (ref 70–99)
Glucose-Capillary: 74 mg/dL (ref 70–99)
Glucose-Capillary: 72 mg/dL (ref 70–99)
Glucose-Capillary: 92 mg/dL (ref 70–99)

## 2021-07-24 LAB — MAGNESIUM: Magnesium: 1.8 mg/dL (ref 1.7–2.4)

## 2021-07-24 MED ORDER — SIMVASTATIN 20 MG PO TABS
40.0000 mg | ORAL_TABLET | Freq: Every day | ORAL | Status: DC
Start: 2021-07-24 — End: 2021-07-27
  Administered 2021-07-24 – 2021-07-26 (×3): 40 mg via ORAL
  Filled 2021-07-24 (×3): qty 2

## 2021-07-24 MED ORDER — LOSARTAN POTASSIUM 50 MG PO TABS
50.0000 mg | ORAL_TABLET | Freq: Every day | ORAL | Status: DC
  Administered 2021-07-24 – 2021-07-27 (×4): 50 mg via ORAL
  Filled 2021-07-24 (×4): qty 1

## 2021-07-24 MED ORDER — METOPROLOL TARTRATE 25 MG PO TABS
25.0000 mg | ORAL_TABLET | Freq: Two times a day (BID) | ORAL | Status: DC
  Administered 2021-07-24 – 2021-07-27 (×6): 25 mg via ORAL
  Filled 2021-07-24 (×6): qty 1

## 2021-07-24 MED ORDER — LEVETIRACETAM IN NACL 500 MG/100ML IV SOLN
500.0000 mg | Freq: Two times a day (BID) | INTRAVENOUS | Status: DC
  Filled 2021-07-24 (×2): qty 100

## 2021-07-24 MED ORDER — LEVETIRACETAM IN NACL 500 MG/100ML IV SOLN: 500.0000 mg | Freq: Two times a day (BID) | INTRAVENOUS | Status: DC

## 2021-07-24 MED ORDER — CYANOCOBALAMIN 1000 MCG/ML IJ SOLN
1000.0000 ug | Freq: Every day | INTRAMUSCULAR | Status: DC
  Administered 2021-07-24 – 2021-07-27 (×4): 1000 ug via SUBCUTANEOUS
  Filled 2021-07-24 (×4): qty 1

## 2021-07-24 MED ORDER — SODIUM CHLORIDE 0.9% FLUSH
3.0000 mL | Freq: Two times a day (BID) | INTRAVENOUS | Status: DC
  Administered 2021-07-24 – 2021-07-27 (×6): 3 mL via INTRAVENOUS

## 2021-07-24 MED ORDER — SENNOSIDES-DOCUSATE SODIUM 8.6-50 MG PO TABS: 1.0000 | ORAL_TABLET | Freq: Every evening | ORAL | Status: DC | PRN

## 2021-07-24 MED ORDER — LEVETIRACETAM 500 MG PO TABS: 500.0000 mg | ORAL_TABLET | Freq: Two times a day (BID) | ORAL | Status: DC

## 2021-07-24 MED ORDER — LEVETIRACETAM 500 MG PO TABS
500.0000 mg | ORAL_TABLET | Freq: Two times a day (BID) | ORAL | Status: DC
  Administered 2021-07-24 – 2021-07-27 (×7): 500 mg via ORAL
  Filled 2021-07-24 (×7): qty 1

## 2021-07-24 MED ORDER — ACETAMINOPHEN 650 MG RE SUPP: 650.0000 mg | Freq: Four times a day (QID) | RECTAL | Status: DC | PRN

## 2021-07-24 MED ORDER — ACETAMINOPHEN 325 MG PO TABS
650.0000 mg | ORAL_TABLET | Freq: Four times a day (QID) | ORAL | Status: DC | PRN
  Administered 2021-07-24 – 2021-07-26 (×3): 650 mg via ORAL
  Filled 2021-07-24 (×3): qty 2

## 2021-07-24 MED ORDER — SODIUM CHLORIDE 0.9 % IV SOLN: INTRAVENOUS | Status: AC

## 2021-07-24 NOTE — Assessment & Plan Note (Signed)
Stable, denies any chest pain.  Will hold aspirin for now in setting of large hematoma to left forehead. ?

## 2021-07-24 NOTE — Assessment & Plan Note (Signed)
Holding antihypertensives for now pending orthostatic vitals. ?

## 2021-07-24 NOTE — Progress Notes (Signed)
?   07/24/21 1551  ?What Happened  ?Was fall witnessed? No  ?Was patient injured? No  ?Patient found in bathroom  ?Found by Staff-comment ?Georgiann Mccoy, NT)  ?Stated prior activity bathroom-unassisted ?(NT and RN assisted pt to bathroom. pt stated she would call when done, attempting to have BM. yellow string to call was placed in pt's hand to pull when done)  ?Follow Up  ?MD notified Jeoffrey Massed, MD  ?Time MD notified [phone]  ?Family notified Yes - comment ?(daughter, Elon Jester)  ?Time family notified [phone]  ?Additional tests No  ?Simple treatment Other (comment) ?(pt declines Tylenol, ice at this time)  ?Progress note created (see row info) Yes  ?Adult Fall Risk Assessment  ?Risk Factor Category (scoring not indicated) Fall has occurred during this admission (document High fall risk) ?(fall 3/18)  ?Patient Fall Risk Level High fall risk  ?Adult Fall Risk Interventions  ?Required Bundle Interventions *See Row Information* High fall risk - low, moderate, and high requirements implemented  ?Additional Interventions PT/OT need assessed if change in mobility from baseline;Use of appropriate toileting equipment (bedpan, BSC, etc.)  ?Screening for Fall Injury Risk (To be completed on HIGH fall risk patients) - Assessing Need for Floor Mats  ?Risk For Fall Injury- Criteria for Floor Mats 85 years or older;Admitted as a result of a fall  ?Will Implement Floor Mats Yes  ?Vitals  ?Temp 97.8 ?F (36.6 ?C)  ?Temp Source Oral  ?BP (!) 174/83  ?MAP (mmHg) 107  ?BP Location Right Arm  ?BP Method Automatic  ?Patient Position (if appropriate) Lying  ?Pulse Rate 70  ?Pulse Rate Source Monitor  ?Resp 17  ?Oxygen Therapy  ?SpO2 98 %  ?Pain Assessment  ?Pain Scale 0-10  ?Pain Score 0  ?Neurological  ?Neuro (WDL) X  ?Level of Consciousness Alert  ?Orientation Level Oriented X4  ?R Hand Grip Moderate  ?L Hand Grip Moderate  ?Neuro Symptoms Forgetful  ?Glasgow Coma Scale  ?Eye Opening 4  ?Best Verbal Response (NON-intubated) 5  ?Best Motor  Response 6  ?Glasgow Coma Scale Score 15  ?Musculoskeletal  ?Musculoskeletal (WDL) X  ?Assistive Device Front wheel walker  ?Generalized Weakness Yes  ?Weight Bearing Restrictions No  ?Musculoskeletal Details  ?LUE Limited movement;Swelling;Contracture  ?Neck Ortho/Supportive Device  ?Neck Ortho/Supportive Device Collar  ?Collar [place] J  ?Integumentary  ?Integumentary (WDL) X  ?Skin Color Appropriate for ethnicity  ?Skin Condition Dry  ?Skin Integrity Abrasion;Hematoma  ?Abrasion Location Head ?(forehead)  ?Abrasion Location Orientation Left;Upper  ?Abrasion Intervention Other (Comment) ?(assessed)  ?Location of Hematoma Left forehead  ?Skin Turgor Non-tenting  ? ?

## 2021-07-24 NOTE — Hospital Course (Signed)
Felicia Cross is a 88 y.o. female with medical history significant for CAD, HTN, HLD who is admitted for syncope evaluation. ?

## 2021-07-24 NOTE — Progress Notes (Signed)
TORIA MONTE [id number] ?Admitted to 6Y40: 07/24/2021 0210 ?Attending Provider: Charlsie Quest, MD  ? ? ?NERIYAH CERCONE is a 88 y.o. female patient admitted from ED awake, alert  & oriented  X 3, forgetful at times,  DNR, VSS - Blood pressure 132/71, pulse 73, temperature 98.2 ?F (36.8 ?C), temperature source Oral, resp. rate 16, height 5\' 4"  (1.626 m), weight 69.9 kg, SpO2 100 %.  No c/o shortness of breath, no c/o chest pain, no distress noted. Tele placed and pt is currently running: NSR with PVCs. ? ? IV site WDL:  with a transparent dsg that's clean dry and intact. ? ?Allergies:   ?Allergies  ?Allergen Reactions  ? Codeine Nausea And Vomiting  ? Hydrocodone Nausea And Vomiting  ? ? ? ?Past Medical History:  ?Diagnosis Date  ? Anemia 01/18/2016  ? -reports on and off her whole life, on oral iron in the past remotely per report  ? Arthritis   ? B12 deficiency 01/18/2016  ? CAD (coronary artery disease)   ? CVD (cerebrovascular disease)   ? Diverticulosis of colon   ? H. pylori infection 10/20/2015  ? Hyperlipidemia   ? Hypertension   ? Osteoarthritis 03/07/2006  ? Qualifier: Diagnosis of  By: 03/09/2006 MD, Bruce    ? ? ?History:  obtained from daughter ? ?Pt and daughter Cato Mulligan oriented to unit, room and routine. Information packet given to patient/family.  Admission INP armband ID verified with patient/family, and in place. SR up x 4 with seizure precautions in place, fall risk assessment complete with Patient and family verbalizing understanding of risks associated with falls. Pt verbalizes an understanding of how to use the call bell and to call for help before getting out of bed.  Skin, clean-dry- contusion/hematoma with overlying abrasion noted on Left upper forehead.  Left forearm swollen.  No other evidence of skin break down noted on exam. ? ?Will cont to monitor and assist as needed. ? ?Marcelino Duster, RN ?07/24/2021 2:50 AM  ?

## 2021-07-24 NOTE — Progress Notes (Signed)
Occupational Therapy Evaluation ? ?Patient from home, unsure of reliability with PLOF as patient lethargic at times, slow to respond to questions and difficulty following 1 step commands. Patient states she is ambulatory with walker and mod I with self care tasks. Currently patient needing min A for bed mobility and mod A to power up to standing with max cues for hand placement. Patient having difficulty advancing L leg to side step to head of bed needing mod A. Poor eccentric control onto bed, does not initiate reaching back. Patient high fall risk at this time and needing increased assist with self care tasks + transfers. Recommend continued acute OT services to maximize patient safety and independence with self care in order to facilitate D/C to venue listed below. ? ?Attempted orthostatic vitals however difficulty maintaining static stand at edge of bed ? ?Semi-supine 142/82 ?Sitting 147/73 ? ? ? 07/24/21 1300  ?OT Visit Information  ?Last OT Received On 07/24/21  ?Assistance Needed +1  ?History of Present Illness 88 y.o. female who presented to the ED for evaluation after a fall at home. CT head without contrast shows moderate extracranial hematoma overlying the left frontal bone. MRI Brain with small L occipital dependent IVH. MRI C spine with mild posttraumatic ligamentous injury at C1-C2. PMH significant for CAD, HTN, HLD  ?Precautions  ?Precautions Fall;Other (comment)  ?Precaution Comments seizure  ?Required Braces or Orthoses Cervical Brace  ?Cervical Brace Hard collar;At all times  ?Restrictions  ?Weight Bearing Restrictions No  ?Home Living  ?Family/patient expects to be discharged to: Private residence  ?Living Arrangements Spouse/significant other  ?Available Help at Discharge Family;Available 24 hours/day  ?Type of Home House  ?Home Access Stairs to enter  ?Entrance Stairs-Number of Steps 1-5 ?(providing conflicting answers to OT/PT)  ?Entrance Stairs-Rails None  ?Home Layout Able to live on main  level with bedroom/bathroom  ?Bathroom Shower/Tub Walk-in shower  ?Bathroom Toilet Standard  ?Home Equipment Rolling Walker (2 wheels)  ?Prior Function  ?Prior Level of Function  Independent/Modified Independent  ?Mobility Comments ambulates with walker  ?Communication  ?Communication No difficulties  ?Pain Assessment  ?Pain Assessment No/denies pain  ?Cognition  ?Arousal/Alertness Lethargic  ?Behavior During Therapy Flat affect  ?Overall Cognitive Status No family/caregiver present to determine baseline cognitive functioning  ?General Comments Patient is alert and oriented x3 however difficulty following 1 step directions such as keeping arm still during BP, hand placement during transfers  ?Upper Extremity Assessment  ?Upper Extremity Assessment Generalized weakness ?(AROM B UE grossly intact)  ?Lower Extremity Assessment  ?Lower Extremity Assessment Defer to PT evaluation  ?Cervical / Trunk Assessment  ?Cervical / Trunk Assessment Kyphotic  ?ADL  ?Overall ADL's  Needs assistance/impaired  ?Eating/Feeding Set up;Supervision/ safety;Sitting  ?Grooming Set up;Supervision/safety;Sitting  ?Upper Body Bathing Min guard;Sitting  ?Lower Body Bathing Moderate assistance;Sit to/from stand;Sitting/lateral leans  ?Upper Body Dressing  Min guard;Sitting  ?Lower Body Dressing Moderate assistance;Sitting/lateral leans;Sit to/from stand  ?Lower Body Dressing Details (indicate cue type and reason) Difficulty maintaining sitting balance at times  ?Toilet Transfer Moderate assistance;Cueing for safety;Cueing for sequencing;Rolling walker (2 wheels)  ?Toilet Transfer Details (indicate cue type and reason) Patient needing mod A to power up to standing from edge of bed. Max cues for hand placement to push from bed vs pull on walker. Patient also keeping very narrow base of support needing max cues to keep space between feet in sitting. Once standing patient unable to advance L LE when trying to side step to head of bed needing mod  A.  Poor eccentric control when sitting.  ?Toileting- Clothing Manipulation and Hygiene Total assistance;Sitting/lateral lean;Sit to/from stand  ?Functional mobility during ADLs Moderate assistance;Cueing for safety;Cueing for sequencing;Rolling walker (2 wheels)  ?General ADL Comments Patient needing significant assistance with self care tasks due to impaired cognition, balance, activity tolerance, safety awareness  ?Bed Mobility  ?Overal bed mobility Needs Assistance  ?Bed Mobility Rolling;Sidelying to Sit  ?Rolling Min guard  ?Sidelying to sit Min assist  ?General bed mobility comments Patient having difficulty with scooting hips out towards edge of bed. Heavy use of bed rails when sitting upright  ?Balance  ?Overall balance assessment Needs assistance  ?Sitting-balance support Feet supported  ?Sitting balance-Leahy Scale Poor  ?Sitting balance - Comments poor to fair at times leaning backwards needing cues to correct  ?Standing balance support Reliant on assistive device for balance;During functional activity  ?Standing balance-Leahy Scale Poor  ?Standing balance comment posterior lean  ?OT - End of Session  ?Equipment Utilized During [employer] walker (2 wheels);Cervical collar  ?Activity Tolerance Patient limited by lethargy;Patient limited by fatigue  ?Patient left in bed;with call bell/phone within reach;with bed alarm set  ?Nurse Communication Mobility status  ?OT Assessment  ?OT Recommendation/Assessment Patient needs continued OT Services  ?OT Visit Diagnosis Unsteadiness on feet (R26.81);Other abnormalities of gait and mobility (R26.89);History of falling (Z91.81)  ?OT Problem List Decreased strength;Decreased activity tolerance;Impaired balance (sitting and/or standing);Decreased cognition;Decreased safety awareness;Decreased knowledge of use of DME or AE;Decreased knowledge of precautions  ?OT Plan  ?OT Frequency (ACUTE ONLY) Min 2X/week  ?OT Treatment/Interventions (ACUTE ONLY) Self-care/ADL  training;Therapeutic exercise;Therapeutic activities;Patient/family education;Balance training;Cognitive remediation/compensation;DME and/or AE instruction  ?AM-PAC OT "6 Clicks" Daily Activity Outcome Measure (Version 2)  ?Help from another person eating meals? 3  ?Help from another person taking care of personal grooming? 3  ?Help from another person toileting, which includes using toliet, bedpan, or urinal? 2  ?Help from another person bathing (including washing, rinsing, drying)? 2  ?Help from another person to put on and taking off regular upper body clothing? 3  ?Help from another person to put on and taking off regular lower body clothing? 2  ?6 Click Score 15  ?Progressive Mobility  ?What is the highest level of mobility based on the progressive mobility assessment? Level 3 (Stands with assist) - Balance while standing  and cannot march in place  ?Activity Stood at bedside  ?OT Recommendation  ?Follow Up Recommendations Skilled nursing-short term rehab (<3 hours/day)  ?Assistance recommended at discharge Frequent or constant Supervision/Assistance  ?Patient can return home with the following A lot of help with walking and/or transfers;A lot of help with bathing/dressing/bathroom;Assistance with cooking/housework;Direct supervision/assist for medications management;Direct supervision/assist for financial management;Assist for transportation;Help with stairs or ramp for entrance  ?Functional Status Assessent Patient has had a recent decline in their functional status and demonstrates the ability to make significant improvements in function in a reasonable and predictable amount of time.  ?OT Equipment BSC/3in1 ?(unsure of home DME)  ?Individuals Consulted  ?Consulted and Agree with Results and Recommendations Patient  ?Acute Rehab OT Goals  ?Patient Stated Goal did not state  ?OT Goal Formulation Patient unable to participate in goal setting  ?Time For Goal Achievement 08/07/21  ?Potential to Achieve Goals  Good  ?OT Time Calculation  ?OT Start Time (ACUTE ONLY) 4132  ?OT Stop Time (ACUTE ONLY) 0846  ?OT Time Calculation (min) 20 min  ?OT General Charges  ?$OT Visit 1 Visit  ?OT Evaluation  ?$OT Eval Low  Complexity

## 2021-07-24 NOTE — Progress Notes (Signed)
EEG complete - results pending 

## 2021-07-24 NOTE — Progress Notes (Addendum)
?      ?                 PROGRESS NOTE ? ?      ?PATIENT DETAILS ?Name: Felicia Cross ?Age: 88 y.o. ?Sex: female ?Date of Birth: 1932-09-05 ?Admit Date: 07/23/2021 ?Admitting Physician Charlsie Quest, MD ?RDE:YCXKG, Bettey Mare, MD ? ?Brief Summary: ?Patient is a 88 y.o.  female with history of CAD, HTN, HLD who presented to the ED following a fall-while in the ED-she was noted to have seizure.  Evaluated by neurology and started on Keppra. ? ? ?Significant events: ?3/17>> admit to [facility] for fall-while in the ED found to have seizure. ? ?Significant studies: ?3/17>> CXR: No PNA ?3/17>> x-ray pelvis: No fracture ?3/17>> CT head: Moderate left extracranial hematoma over frontal bone-no acute intracranial abnormality ?3/17>> CT C-spine: No traumatic injury ?3/17>> MRI brain: Trace posttraumatic intraventricular hemorrhage layering in the left occipital horn, no other acute intracranial abnormality. ?3/17>> MRI C-spine: Evidence of mild posttraumatic ligamentous injury at C1-C2.  No cord contusion or other traumatic injury. ? ?Significant microbiology data: ?3/17>> COVID/influenza PCR: Negative ? ?Procedures: ?None ? ?Consults: ?Neurology ? ?Subjective: ?slightly slow/lethargic but answers questions appropriately.  No family at bedside. ? ?Objective: ?Vitals: ?Blood pressure (!) 152/85, pulse 60, temperature 97.8 ?F (36.6 ?C), temperature source Axillary, resp. rate 16, height 5\' 4"  (1.626 m), weight 70.9 kg, SpO2 100 %.  ? ?Exam: ?Gen Exam:Alert awake-not in any distress ?HEENT:atraumatic, normocephalic ?Chest: B/L clear to auscultation anteriorly ?CVS:S1S2 regular ?Abdomen:soft non tender, non distended ?Extremities:no edema ?Neurology: Non focal ?Skin: no rash ? ?Pertinent Labs/Radiology: ?CBC Latest Ref Rng & Units 07/24/2021 07/23/2021 07/12/2021  ?WBC 4.0 - 10.5 K/uL 7.4 5.9 3.9(L)  ?Hemoglobin 12.0 - 15.0 g/dL 11.6(L) 11.6(L) 11.6(L)  ?Hematocrit 36.0 - 46.0 % 34.6(L) 36.1 33.8(L)  ?Platelets 150 - 400 K/uL 254 255  262.0  ?  ?Lab Results  ?Component Value Date  ? NA 134 (L) 07/24/2021  ? K 3.7 07/24/2021  ? CL 99 07/24/2021  ? CO2 28 07/24/2021  ?  ? ? ?Assessment/Plan: ?Syncope: Unclear whether this syncope was from seizure or other etiology-sounds like she probably had posttraumatic seizure in the ED.  Continue telemetry monitoring-check echo-check orthostatic vital signs-and see how she does.   ? ?Seizure: Likely posttraumatic-she has trace posttraumatic IVH in the left occipital horn.  Unclear whether the initial syncope event was a seizure itself.  In any event-await EEG-evaluated by neurology and has been started on Keppra 500 mg twice daily x6 weeks, then to 50 mg twice daily x1 week and stop. ? ?Traumatic hematoma to left frontal forehead/trace posttraumatic intraventricular hemorrhage layering in the left occipital horn, mild posttraumatic ligamentous injury at C1-C2: Likely sequelae of fall/syncope at home continue supportive care-patient is wearing a c-collar-we will touch base with trauma team. ? ?Addendum-discussed with trauma MD Dr. 07/26/2021 then with neurosurgical MD-Dr. Elsner-recommendations are for a soft cervical collar x2 weeks. ? ?Vitamin B12 deficiency: Continue supplementation ? ?HTN: BP stable-holding all antihypertensives for now-awaiting orthostatic vital signs. ? ?HLD: Continue statin ? ?Hyponatremia: Mild-no clinical significance. ? ?BMI: ?Estimated body mass index is 26.83 kg/m? as calculated from the following: ?  Height as of this encounter: 5\' 4"  (1.626 m). ?  Weight as of this encounter: 70.9 kg.  ? ?Code status: ?  Code Status: DNR  ? ?DVT Prophylaxis: ?SCDs Start: 07/24/21 0031 ?  ?Family Communication: Updated spouse-Hollis-[phone] over the phone on 3/18 ? ? ?Disposition Plan: ?  Status is: Observation ?The patient will require care spanning > 2 midnights and should be moved to inpatient because: Syncope-new onset seizure-starting seizure medications-awaiting PT  eval-echocardiogram-EEG-not yet stable for discharge.  Probable discharge on 3/19 if she demonstrates stability. ?  ?Planned Discharge Destination:Home health ? ? ?Diet: ?Diet Order   ? ?       ?  Diet Heart Room service appropriate? Yes; Fluid consistency: Thin  Diet effective now       ?  ? ?  ?  ? ?  ?  ? ? ?Antimicrobial agents: ?Anti-infectives (From admission, onward)  ? ? None  ? ?  ? ? ? ?MEDICATIONS: ?Scheduled Meds: ? cyanocobalamin  1,000 mcg Subcutaneous Daily  ? levETIRAcetam  500 mg Oral BID  ? simvastatin  40 mg Oral q1800  ? sodium chloride flush  3 mL Intravenous Q12H  ? ?Continuous Infusions: ? levETIRAcetam    ? ?PRN Meds:.acetaminophen **OR** acetaminophen, senna-docusate ? ? ?I have personally reviewed following labs and imaging studies ? ?LABORATORY DATA: ?CBC: ?Recent Labs  ?Lab 07/23/21 ?2010 07/24/21 ?0332  ?WBC 5.9 7.4  ?NEUTROABS 2.5  --   ?HGB 11.6* 11.6*  ?HCT 36.1 34.6*  ?MCV 97.0 92.3  ?PLT 255 254  ? ? ?Basic Metabolic Panel: ?Recent Labs  ?Lab 07/23/21 ?2010 07/24/21 ?0332  ?NA 130* 134*  ?K 4.5 3.7  ?CL 94* 99  ?CO2 24 28  ?GLUCOSE 94 88  ?BUN 10 7*  ?CREATININE 0.86 0.83  ?CALCIUM 9.6 9.5  ?MG  --  1.8  ? ? ?GFR: ?Estimated Creatinine Clearance: 47 mL/min (by C-G formula based on SCr of 0.83 mg/dL). ? ?Liver Function Tests: ?Recent Labs  ?Lab 07/23/21 ?2223  ?AST 23  ?ALT 10  ?ALKPHOS 33*  ?BILITOT 0.7  ?PROT 5.9*  ?ALBUMIN 3.4*  ? ?No results for input(s): LIPASE, AMYLASE in the last 168 hours. ?No results for input(s): AMMONIA in the last 168 hours. ? ?Coagulation Profile: ?No results for input(s): INR, PROTIME in the last 168 hours. ? ?Cardiac Enzymes: ?No results for input(s): CKTOTAL, CKMB, CKMBINDEX, TROPONINI in the last 168 hours. ? ?BNP (last 3 results) ?No results for input(s): PROBNP in the last 8760 hours. ? ?Lipid Profile: ?No results for input(s): CHOL, HDL, LDLCALC, TRIG, CHOLHDL, LDLDIRECT in the last 72 hours. ? ?Thyroid Function Tests: ?No results for input(s):  TSH, T4TOTAL, FREET4, T3FREE, THYROIDAB in the last 72 hours. ? ?Anemia Panel: ?No results for input(s): VITAMINB12, FOLATE, FERRITIN, TIBC, IRON, RETICCTPCT in the last 72 hours. ? ?Urine analysis: ?   ?Component Value Date/Time  ? COLORURINE YELLOW 07/23/2021 2014  ? APPEARANCEUR CLEAR 07/23/2021 2014  ? LABSPEC 1.012 07/23/2021 2014  ? PHURINE 7.0 07/23/2021 2014  ? GLUCOSEU NEGATIVE 07/23/2021 2014  ? HGBUR NEGATIVE 07/23/2021 2014  ? BILIRUBINUR NEGATIVE 07/23/2021 2014  ? BILIRUBINUR negative 10/13/2015 1308  ? [place] AtlasKETONESUR NEGATIVE 07/23/2021 2014  ? PROTEINUR NEGATIVE 07/23/2021 2014  ? UROBILINOGEN 0.2 10/13/2015 1308  ? UROBILINOGEN 1.0 02/11/2009 1432  ? NITRITE NEGATIVE 07/23/2021 2014  ? LEUKOCYTESUR NEGATIVE 07/23/2021 2014  ? ? ?Sepsis Labs: ?Lactic Acid, Venous ?No results found for: LATICACIDVEN ? ?MICROBIOLOGY: ?Recent Results (from the past 240 hour(s))  ?Resp Panel by RT-PCR (Flu A&B, Covid) Nasopharyngeal Swab     Status: None  ? Collection Time: 07/23/21  9:57 PM  ? Specimen: Nasopharyngeal Swab; Nasopharyngeal(NP) swabs in vial transport medium  ?Result Value Ref Range Status  ? SARS Coronavirus 2 by RT PCR NEGATIVE NEGATIVE  Final  ?  Comment: (NOTE) ?SARS-CoV-2 target nucleic acids are NOT DETECTED. ? ?The SARS-CoV-2 RNA is generally detectable in upper respiratory ?specimens during the acute phase of infection. The lowest ?concentration of SARS-CoV-2 viral copies this assay can detect is ?138 copies/mL. A negative result does not preclude SARS-Cov-2 ?infection and should not be used as the sole basis for treatment or ?other patient management decisions. A negative result may occur with  ?improper specimen collection/handling, submission of specimen other ?than nasopharyngeal swab, presence of viral mutation(s) within the ?areas targeted by this assay, and inadequate number of viral ?copies(<138 copies/mL). A negative result must be combined with ?clinical observations, patient history, and  epidemiological ?information. The expected result is Negative. ? ?Fact Sheet for Patients:  ?BloggerCourse.com ? ?Fact Sheet for Healthcare Providers:  ?SeriousBroker.it ? ?This test

## 2021-07-24 NOTE — Progress Notes (Signed)
?  Echocardiogram ?2D Echocardiogram has been performed. ? ?Felicia Cross ?07/24/2021, 5:47 PM ?

## 2021-07-24 NOTE — Consult Note (Addendum)
NEUROLOGY CONSULTATION NOTE  ? ?Date of service: July 24, 2021 ?Patient Name: Felicia Cross ?MRN:  [id number] ?DOB:  12/27/1932 ?Reason for consult: "possible seizure" ?Requesting Provider: Maretta Bees, MD ?_ _ _   _ __   _ __ _ _  __ __   _ __   __ _ ? ?History of Present Illness  ?Felicia Cross is a 88 y.o. female with PMH significant for CAD, HTN, HLD, Osteoarthritis who presents with a fall. ? ?Patient is asleep on my evaluation. Daughter and bedside provides most of the history. Per daughter, she fell in the kitchen. Husband heard a loud thud and found her on the floor. She had a large bump on the left front of her head. When paramedics arrived, she was awake, alert, talkative and answering questions. She did not know who the president was. ? ?She was brought in to the ED where she develop tightness of her L arm, it drew up closer to her chest and she could not let it go. She has known L elbow fracture that is chronic and so she has some stiffness in her left arm but family has never seen anything like this in the past. ? ?No prior seizures, no family hx of seizures, she does not drink alcohol, no recreational substances, no hx of stroke, no hx of ICH, no CNS surgery. No other significant had injury. ? ?She had CTH in the ED with a large Left scalp hematoma but no acute intracranial abnormalities. She was placed in a C collar. MRI Brain with small L occipital dependent IVH. MRI C spine with mild posttraumatic ligamentous injury at C1-C2. ?  ?ROS  ? ?Patient is somnolent on my evaluation and unable to answer ROS. ? ?Past History  ? ?Past Medical History:  ?Diagnosis Date  ? Anemia 01/18/2016  ? -reports on and off her whole life, on oral iron in the past remotely per report  ? Arthritis   ? B12 deficiency 01/18/2016  ? CAD (coronary artery disease)   ? CVD (cerebrovascular disease)   ? Diverticulosis of colon   ? H. pylori infection 10/20/2015  ? Hyperlipidemia   ? Hypertension   ? Osteoarthritis  03/07/2006  ? Qualifier: Diagnosis of  By: Cato Mulligan MD, Bruce    ? ?Past Surgical History:  ?Procedure Laterality Date  ? ABDOMINAL HYSTERECTOMY  1984  ? REPLACEMENT TOTAL KNEE  2007  ? left  ? TOTAL HIP ARTHROPLASTY  2010  ? Dr. Turner Daniels, right  ? TOTAL KNEE ARTHROPLASTY Right 09/14/2015  ? Procedure: TOTAL KNEE ARTHROPLASTY;  Surgeon: Dannielle Huh, MD;  Location: [facility] OR;  Service: Orthopedics;  Laterality: Right;  ? ?Family History  ?Problem Relation Age of Onset  ? Diabetes Mother   ? Hypertension Father   ? Kidney disease Father   ? Heart attack Sister   ? Heart attack Brother   ? Colon cancer Neg Hx   ? Stomach cancer Neg Hx   ? Esophageal cancer Neg Hx   ? Rectal cancer Neg Hx   ? ?Social History  ? ?Socioeconomic History  ? Marital status: Married  ?  Spouse name: Not on file  ? Number of children: Not on file  ? Years of education: Not on file  ? Highest education level: Not on file  ?Occupational History  ? Not on file  ?Tobacco Use  ? Smoking status: Former  ?  Packs/day: 1.00  ?  Years: 30.00  ?  Pack years: 30.00  ?  Types: Cigarettes  ?  Quit date: 11/01/1980  ?  Years since quitting: 40.7  ? Smokeless tobacco: Never  ?Substance and Sexual Activity  ? Alcohol use: Yes  ?  Alcohol/week: 0.0 standard drinks  ?  Comment: glass of wine  ? Drug use: No  ? Sexual activity: Not on file  ?Other Topics Concern  ? Not on file  ?Social History Narrative  ? Updated 01/07/15  ? 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 [place]  ?   ? Home 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, Information systems manager and two great grandchildren age 15 and 6  ?   ? Spiritual Beliefs: Christian  ?   ? Lifestyle: regular exercise at the [facility]; healthy diet  ?   ? ?Social Determinants of Health  ? ?Financial Resource Strain: Not on file  ?Food Insecurity: Not on file  ?Transportation Needs: Not on file  ?Physical Activity: Not on file  ?Stress: Not on file  ?Social Connections: Not on file  ? ?Allergies  ?Allergen Reactions  ? Codeine Nausea And  Vomiting  ? Hydrocodone Nausea And Vomiting  ? ? ?Medications  ? ?Medications Prior to Admission  ?Medication Sig Dispense Refill Last Dose  ? acetaminophen (TYLENOL) 500 MG tablet Take 1,000 mg by mouth every 6 (six) hours as needed for moderate pain or headache.   Past Week  ? aspirin EC 81 MG tablet Take 1 tablet (81 mg total) by mouth daily.   07/23/2021  ? dimenhyDRINATE (DRAMAMINE PO) Take 1 tablet by mouth daily as needed (dizziness). As needed for vertigo   unk  ? hydrochlorothiazide (HYDRODIURIL) 25 MG tablet Take 1 tablet by mouth once daily (Patient taking differently: Take 25 mg by mouth daily.) 90 tablet 0 07/23/2021  ? losartan (COZAAR) 100 MG tablet Take 1 tablet by mouth once daily (Patient taking differently: Take 100 mg by mouth daily.) 90 tablet 1 07/23/2021  ? metoprolol tartrate (LOPRESSOR) 25 MG tablet Take 1 tablet by mouth twice daily (Patient taking differently: Take 25 mg by mouth 2 (two) times daily.) 180 tablet 0 07/23/2021 at 0900  ? Multiple Vitamin (MULTIVITAMIN WITH MINERALS) TABS Take 1 tablet by mouth daily.    07/23/2021  ? simvastatin (ZOCOR) 40 MG tablet Take 1 tablet by mouth once daily (Patient taking differently: Take 40 mg by mouth daily.) 46 tablet 3 07/23/2021  ? gabapentin (NEURONTIN) 100 MG capsule Take 1 capsule by mouth at bedtime (Patient not taking: Reported on 07/23/2021) 90 capsule 0 Not Taking  ?  ? ?Vitals  ? ?Vitals:  ? 07/24/21 0210 07/24/21 0213 07/24/21 0215 07/24/21 0400  ?BP: (!) 183/94 (!) 152/92 (!) 147/95 (!) 160/74  ?Pulse: 84 83 93 73  ?Resp: (!) 21 19 (!) 22 14  ?Temp: 98.3 ?F (36.8 ?C)   98 ?F (36.7 ?C)  ?TempSrc: Oral   Axillary  ?SpO2: 99% 99% 97% 97%  ?Weight:      ?Height:      ?  ? ?Body mass index is 26.45 kg/m?. ? ?Physical Exam  ? ?General: Laying comfortably in bed; in no acute distress.  ?HENT: Normal oropharynx and mucosa. Normal external appearance of ears and nose.  ?Neck: Supple, some mild tenderness. C collar in place. ?CV: No JVD. No  peripheral edema.  ?Pulmonary: Symmetric Chest rise. Normal respiratory effort.  ?Abdomen: Soft to touch, non-tender.  ?Ext: No cyanosis, edema, or deformity  ?Skin: No rash. Normal palpation of skin.   ?Musculoskeletal: Normal digits and  nails by inspection. No clubbing.  ? ?Neurologic Examination  ?Mental status/Cognition: Opens eyes to vigorous stimulation and to nares stimulation. Make sbrief eye contact with me and with her daughter. ?Speech/language: somnolent precludes assess but does say "what" when daughter called her name and did follow commands for me. ?Cranial nerves:  ? CN II Pupils equal and reactive to ligh. Unable to assess for VF deficits.  ? CN III,IV,VI EOM intact, no gaze preference or deviation, no nystagmus  ? CN V normal sensation in V1, V2, and V3 segments bilaterally  ? CN VII no asymmetry, no nasolabial fold flattening  ? CN VIII Turns head towards speech  ? CN IX & X Unabel to assess.  ? CN XI Unable to assess.  ? CN XII midline tongue but does not protrude to command.  ? ?Motor:  ?Muscle bulk: normal, tone increased mildly in LUE ? ?Somnolence precludes detailed assessment of her motor strength but she moves all extremities spontaneously and follows commands in all 4. ? ?Reflexes: ? Right Left Comments  ?Pectoralis     ? Biceps (C5/6) 2 2   ?Brachioradialis (C5/6) 2 2   ? Triceps (C6/7) 2 2   ? Patellar (L3/4) 2 2   ? Achilles (S1)     ? Hoffman     ? Plantar     ?Jaw jerk   ? ?Sensation: ? Light touch Regards touch in all extremities.  ? Pin prick   ? Temperature   ? Vibration   ?Proprioception   ? ?Coordination/Complex Motor:  ?- Finger to Nose unable to asses but her movements are very smooth ?- Heel to shin unable to assess 2/2 somnolence ?- Rapid alternating movement unable to assess 2/2 somnolence. ?- Gait: Deferred for patient safety. ? ?Labs  ? ?CBC:  ?Recent Labs  ?Lab 07/23/21 ?2010 07/24/21 ?0332  ?WBC 5.9 7.4  ?NEUTROABS 2.5  --   ?HGB 11.6* 11.6*  ?HCT 36.1 34.6*  ?MCV 97.0  92.3  ?PLT 255 254  ? ? ?Basic Metabolic Panel:  ?Lab Results  ?Component Value Date  ? NA 134 (L) 07/24/2021  ? K 3.7 07/24/2021  ? CO2 28 07/24/2021  ? GLUCOSE 88 07/24/2021  ? BUN 7 (L) 07/24/2021  ? CREATININE

## 2021-07-24 NOTE — Progress Notes (Signed)
Physical Therapy Evaluation ?Patient Details ?Name: Felicia Cross ?MRN: [id number] ?DOB: 1932/11/10 ?Today's Date: 07/24/2021 ? ?History of Present Illness ? 88 y.o. female who presented to the ED for evaluation after a fall at home. CT head without contrast shows moderate extracranial hematoma overlying the left frontal bone. MRI Brain with small L occipital dependent IVH. MRI C spine with mild posttraumatic ligamentous injury at C1-C2. PMH significant for CAD, HTN, HLD  ?Clinical Impression ?  ?Pt admitted secondary to problem above with deficits below. PTA patient was living with spouse in 2 level home (pt can live on main level) with anywhere from 1 to 5 steps to enter home (pt inconsistent with answers). She ambulated with a RW and reports she did not use it all the time and did not have it with her when she fell.  Pt currently requires min assist for all aspects of mobility and demonstrates a posterior lean/imbalance with standing and walking.  Patient could discharge home if family can provide hands-on assist at min assist level and constant supervision. Anticipate patient will benefit from PT to address problems listed below.Will continue to follow acutely to maximize functional mobility independence and safety.   ?   ?   ? ?Recommendations for follow up therapy are one component of a multi-disciplinary discharge planning process, led by the attending physician.  Recommendations may be updated based on patient status, additional functional criteria and insurance authorization. ? ?Follow Up Recommendations Skilled nursing-short term rehab (<3 hours/day) ? ?  ?Assistance Recommended at Discharge Frequent or constant Supervision/Assistance  ?Patient can return home with the following ? A little help with walking and/or transfers;Assistance with cooking/housework;Direct supervision/assist for medications management;Direct supervision/assist for financial management;Assist for transportation;Help with stairs or  ramp for entrance ? ?  ?Equipment Recommendations None recommended by PT  ?Recommendations for Other Services ? OT consult  ?  ?Functional Status Assessment Patient has had a recent decline in their functional status and demonstrates the ability to make significant improvements in function in a reasonable and predictable amount of time.  ? ?  ?Precautions / Restrictions Precautions ?Precautions: Fall;Other (comment) ?Precaution Comments: seizure ?Required Braces or Orthoses: Cervical Brace ?Cervical Brace: Hard collar;At all times ?Restrictions ?Weight Bearing Restrictions: No  ? ?  ? ?Mobility ? Bed Mobility ?Overal bed mobility: Needs Assistance ?Bed Mobility: Rolling, Sidelying to Sit ?Rolling: Min assist ?Sidelying to sit: Min assist ?  ?  ?  ?General bed mobility comments: pt moving slowly and gets stuck requiring assist to complete movements ?  ? ?Transfers ?Overall transfer level: Needs assistance ?Equipment used: Rolling walker (2 wheels) ?Transfers: Sit to/from Stand ?Sit to Stand: Min assist ?  ?  ?  ?  ?  ?General transfer comment: able to initiate, but required min assist to continue rising to standing ?  ? ?Ambulation/Gait ?Ambulation/Gait assistance: Min assist ?Gait Distance (Feet): 40 Feet ?Assistive device: Rolling walker (2 wheels) ?Gait Pattern/deviations: Step-through pattern, Decreased stride length, Shuffle ?Gait velocity: very slow ?  ?  ?General Gait Details: loses balance posteriorly, especially when turning ? ?Stairs ?  ?  ?  ?  ?  ? ?Wheelchair Mobility ?  ? ?Modified Rankin (Stroke Patients Only) ?  ? ?  ? ?Balance Overall balance assessment: Needs assistance ?Sitting-balance support: No upper extremity supported, Feet supported ?Sitting balance-Leahy Scale: Fair ?  ?  ?Standing balance support: Bilateral upper extremity supported, Reliant on assistive device for balance ?Standing balance-Leahy Scale: Poor ?Standing balance comment: posterior lean ?  ?  ?  ?  ?  ?  ?  ?  ?  ?  ?  ?    ? ? ? ?  Pertinent Vitals/Pain Pain Assessment ?Pain Assessment: No/denies pain  ? ? ?Home Living Family/patient expects to be discharged to:: Private residence ?Living Arrangements: Spouse/significant other ?Available Help at Discharge: Family;Available 24 hours/day ?Type of Home: House ?Home Access: Stairs to enter ?Entrance Stairs-Rails: None ?Entrance Stairs-Number of Steps: 1-5 (had told OT 4-5; told PT one) ?  ?Home Layout: Able to live on main level with bedroom/bathroom ?Home Equipment: Conservation officer, nature (2 wheels) ?   ?  ?Prior Function Prior Level of Function : Independent/Modified Independent ?  ?  ?  ?  ?  ?  ?Mobility Comments: ambulates with walker ?  ?  ? ? ?Hand Dominance  ? Dominant Hand: Right ? ?  ?Extremity/Trunk Assessment  ? Upper Extremity Assessment ?Upper Extremity Assessment: Defer to OT evaluation ?  ? ?Lower Extremity Assessment ?Lower Extremity Assessment: Generalized weakness ?  ? ?Cervical / Trunk Assessment ?Cervical / Trunk Assessment: Kyphotic  ?Communication  ? Communication: No difficulties  ?Cognition Arousal/Alertness: Lethargic ?Behavior During Therapy: Flat affect ?Overall Cognitive Status: No family/caregiver present to determine baseline cognitive functioning ?  ?  ?  ?  ?  ?  ?  ?  ?  ?  ?  ?  ?  ?  ?  ?  ?General Comments: slow to respond/process; very sleepy ?  ?  ? ?  ?General Comments   ? ?  ?Exercises    ? ?Assessment/Plan  ?  ?PT Assessment Patient needs continued PT services  ?PT Problem List Decreased strength;Decreased activity tolerance;Decreased balance;Decreased mobility;Decreased cognition;Decreased knowledge of use of DME;Decreased safety awareness;Decreased knowledge of precautions ? ?   ?  ?PT Treatment Interventions DME instruction;Gait training;Stair training;Functional mobility training;Therapeutic activities;Therapeutic exercise;Balance training;Cognitive remediation;Patient/family education   ? ?PT Goals (Current goals can be found in the Care Plan section)   ?Acute Rehab PT Goals ?Patient Stated Goal: to get better and stop falling ?PT Goal Formulation: With patient ?Time For Goal Achievement: 08/07/21 ?Potential to Achieve Goals: Good ? ?  ?Frequency Min 3X/week (until SNF confirmed) ?  ? ? ?Co-evaluation   ?  ?  ?  ?  ? ? ?  ?AM-PAC PT "6 Clicks" Mobility  ?Outcome Measure Help needed turning from your back to your side while in a flat bed without using bedrails?: A Little ?Help needed moving from lying on your back to sitting on the side of a flat bed without using bedrails?: A Little ?Help needed moving to and from a bed to a chair (including a wheelchair)?: A Little ?Help needed standing up from a chair using your arms (e.g., wheelchair or bedside chair)?: A Little ?Help needed to walk in hospital room?: A Little ?Help needed climbing 3-5 steps with a railing? : Total ?6 Click Score: 16 ? ?  ?End of Session Equipment Utilized During Treatment: Gait belt ?Activity Tolerance: Patient limited by fatigue ?Patient left: in chair;with call bell/phone within reach;with chair alarm set;with nursing/sitter in room ?Nurse Communication: Mobility status ?PT Visit Diagnosis: Unsteadiness on feet (R26.81);Muscle weakness (generalized) (M62.81) ?  ? ?Time: [id number] ?PT Time Calculation (min) (ACUTE ONLY): 30 min ? ? ?Charges:   PT Evaluation ?$PT Eval Low Complexity: 1 Low ?PT Treatments ?$Gait Training: 8-22 mins ?  ?   ? ? ? ?Arby Barrette, PT ?Acute Rehabilitation Services  ?Pager [phone] ?Office [phone] ? ? ?Jeanie Cooks Harshitha Fretz ?07/24/2021, 12:09 PM ?

## 2021-07-24 NOTE — Plan of Care (Signed)
  Problem: Education: Goal: Knowledge of General Education information will improve Description: Including pain rating scale, medication(s)/side effects and non-pharmacologic comfort measures Outcome: Progressing   Problem: Health Behavior/Discharge Planning: Goal: Ability to manage health-related needs will improve Outcome: Progressing   Problem: Clinical Measurements: Goal: Ability to maintain clinical measurements within normal limits will improve Outcome: Progressing   Problem: Elimination: Goal: Will not experience complications related to urinary retention Outcome: Progressing   Problem: Pain Managment: Goal: General experience of comfort will improve Outcome: Progressing   Problem: Safety: Goal: Ability to remain free from injury will improve Outcome: Progressing   Problem: Skin Integrity: Goal: Risk for impaired skin integrity will decrease Outcome: Progressing   

## 2021-07-24 NOTE — Assessment & Plan Note (Signed)
Continue simvastatin. 

## 2021-07-24 NOTE — Procedures (Signed)
Routine EEG Report ? ?Felicia Cross is a 88 y.o. female with a history of spell who is undergoing an EEG to evaluate for seizures. ? ?Report: This EEG was acquired with electrodes placed according to the International 10-20 electrode system (including Fp1, Fp2, F3, F4, C3, C4, P3, P4, O1, O2, T3, T4, T5, T6, A1, A2, Fz, Cz, Pz). The following electrodes were missing or displaced: none. ? ?The occipital dominant rhythm was 9 Hz. This activity is reactive to stimulation. Drowsiness was manifested by background fragmentation; deeper stages of sleep were identified by K complexes and sleep spindles. There was no focal slowing. There were no interictal epileptiform discharges. There were no electrographic seizures identified. Photic stimulation and hyperventilation were not performed. ? ?Impression: This EEG was obtained while awake and asleep and is normal. ?   ?Clinical Correlation: Normal EEGs, however, do not rule out epilepsy. ? ?Bing Neighbors, MD ?[facility] ?[phone] ? ?If 7pm- 7am, please page neurology on call as listed in AMION.  ?

## 2021-07-24 NOTE — Assessment & Plan Note (Signed)
Left upper extremity spasm ?Patient with unwitnessed fall at home, presumed syncopal event as she does not recall the events.  Also concern for seizure-like activity due to left arm spasming/stiffness witnessed by EDP which resolved after IM Ativan. ?-S/p 1 g IV Keppra, neurology to follow ?-Follow MRI brain and C-spine ?-Keep on telemetry ?-Check orthostatic vitals ?-Obtain echocardiogram ?-PT/OT eval, fall precautions ?-IV fluid hydration overnight ?

## 2021-07-24 NOTE — Assessment & Plan Note (Signed)
Occurring after fall at home.  CT head negative for evidence of skull fracture or intracranial hemorrhage. ?

## 2021-07-24 NOTE — Assessment & Plan Note (Signed)
Mild in the setting of HCTZ use which is on hold.  Started on gentle IV fluid hydration with normal saline overnight. ?

## 2021-07-25 DIAGNOSIS — E785 Hyperlipidemia, unspecified: Secondary | ICD-10-CM | POA: Diagnosis not present

## 2021-07-25 DIAGNOSIS — R55 Syncope and collapse: Secondary | ICD-10-CM | POA: Diagnosis not present

## 2021-07-25 DIAGNOSIS — E871 Hypo-osmolality and hyponatremia: Secondary | ICD-10-CM | POA: Diagnosis not present

## 2021-07-25 DIAGNOSIS — R58 Hemorrhage, not elsewhere classified: Secondary | ICD-10-CM | POA: Diagnosis not present

## 2021-07-25 DIAGNOSIS — R569 Unspecified convulsions: Secondary | ICD-10-CM | POA: Diagnosis not present

## 2021-07-25 LAB — GLUCOSE, CAPILLARY
Glucose-Capillary: 91 mg/dL (ref 70–99)
Glucose-Capillary: 149 mg/dL — ABNORMAL HIGH (ref 70–99)
Glucose-Capillary: 128 mg/dL — ABNORMAL HIGH (ref 70–99)

## 2021-07-25 MED ORDER — SODIUM CHLORIDE 0.9 % IV SOLN: INTRAVENOUS | Status: AC

## 2021-07-25 MED ORDER — HYDRALAZINE HCL 20 MG/ML IJ SOLN
10.0000 mg | Freq: Four times a day (QID) | INTRAMUSCULAR | Status: DC | PRN
  Administered 2021-07-25: 10 mg via INTRAVENOUS
  Filled 2021-07-25 (×2): qty 1

## 2021-07-25 NOTE — Progress Notes (Signed)
No further seizures, patient is awake and alert, interactive and appropriate. ? ?Continue Keppra per 6 weeks as indicated in the consult note. ? ?Neurology be available as needed. ? ?Ritta Slot, MD ?[facility] ?[phone] ? ?If 7pm- 7am, please page neurology on call as listed in AMION. ? ?

## 2021-07-25 NOTE — Progress Notes (Signed)
?      ?                 PROGRESS NOTE ? ?      ?PATIENT DETAILS ?Name: Felicia Cross ?Age: 88 y.o. ?Sex: female ?Date of Birth: December 06, 1932 ?Admit Date: 07/23/2021 ?Admitting Physician Maretta Bees, MD ?[id number], Bettey Mare, MD ? ?Brief Summary: ?Patient is a 88 y.o.  female with history of CAD, HTN, HLD who presented to the ED following a fall-while in the ED-she was noted to have seizure.  Evaluated by neurology and started on Keppra. ? ? ?Significant events: ?3/17>> admit to [facility] for fall-while in the ED found to have seizure. ?3/18>> orthostatic vital signs positive-fell while in the bathroom-and landed in her gluteal area. ?3/19>> orthostatic vital signs continue to be positive-starting IVF ? ?Significant studies: ?3/17>> CXR: No PNA ?3/17>> x-ray pelvis: No fracture ?3/17>> CT head: Moderate left extracranial hematoma over frontal bone-no acute intracranial abnormality ?3/17>> CT C-spine: No traumatic injury ?3/17>> MRI brain: Trace posttraumatic intraventricular hemorrhage layering in the left occipital horn, no other acute intracranial abnormality. ?3/17>> MRI C-spine: Evidence of mild posttraumatic ligamentous injury at C1-C2.  No cord contusion or other traumatic injury. ?3/18>> EEG: No seizures ?3/18>> Echo: EF 60-65%, grade 1 diastolic dysfunction ? ?Significant microbiology data: ?3/17>> COVID/influenza PCR: Negative ? ?Procedures: ?None ? ?Consults: ?Neurology ? ?Subjective: ?Lying comfortably in bed. ? ?Objective: ?Vitals: ?Blood pressure (!) 165/83, pulse 70, temperature 98.5 ?F (36.9 ?C), temperature source Oral, resp. rate 16, height 5\' 4"  (1.626 m), weight 65.8 kg, SpO2 98 %.  ? ?Exam: ?Gen Exam:Alert awake-not in any distress ?HEENT:atraumatic, normocephalic ?Chest: B/L clear to auscultation anteriorly ?CVS:S1S2 regular ?Abdomen:soft non tender, non distended ?Extremities:no edema ?Neurology: Non focal ?Skin: no rash ? ?Pertinent Labs/Radiology: ?CBC Latest Ref Rng & Units 07/24/2021  07/23/2021 07/12/2021  ?WBC 4.0 - 10.5 K/uL 7.4 5.9 3.9(L)  ?Hemoglobin 12.0 - 15.0 g/dL 11.6(L) 11.6(L) 11.6(L)  ?Hematocrit 36.0 - 46.0 % 34.6(L) 36.1 33.8(L)  ?Platelets 150 - 400 K/uL 254 255 262.0  ?  ?Lab Results  ?Component Value Date  ? NA 134 (L) 07/24/2021  ? K 3.7 07/24/2021  ? CL 99 07/24/2021  ? CO2 28 07/24/2021  ?  ? ? ?Assessment/Plan: ?Syncope: Suspect due to orthostatic hypotension-orthostatic vital signs positive on 3/18 and 3/19.  Probably due to diuretic use.  Telemetry negative for arrhythmias-echo with stable EF.  We will hydrate gently with IVF and recheck orthostatic vital signs tomorrow.  Will need to tolerate a higher blood pressure. ? ?Seizure: Likely posttraumatic (following syncope)-she has trace posttraumatic IVH in the left occipital horn.  Doubt the initial syncopal event was a seizure-but not sure at this point.  In any event-await EEG-evaluated by neurology and has been started on Keppra 500 mg twice daily x6 weeks, then to 50 mg twice daily x1 week and stop. ? ?Traumatic hematoma to left frontal forehead/trace posttraumatic intraventricular hemorrhage layering in the left occipital horn, mild posttraumatic ligamentous injury at C1-C2: Likely sequelae of fall/syncope at home.  Chart reviewed with trauma MD-Dr. 4/19 and then neurosurgeon-Dr. Donell Beers on 3/18-recommendations are to continue supportive care, neurosurgery recommended soft collar x2 weeks.  Patient with no major neck pain today.   ? ?Orthostatic hypotension: Difficult situation-see above-note on 3/18-patient sustained a fall while in the bathroom and landed in her gluteal area.  Since he continues to have positive orthostatic vital signs-we will gently hydrate with IVF today.  Recheck orthostatic vital signs tomorrow. ? ?  Vitamin B12 deficiency: Continue supplementation ? ?HTN: Resting blood pressure on the higher side-she has positive orthostatic vital signs-difficult situation-we will need to allow some amount of  permissive hypertension.  HCTZ remains on hold-but for now continue with metoprolol and losartan. ? ?HLD: Continue statin ? ?Hyponatremia: Mild-no clinical significance. ? ?BMI: ?Estimated body mass index is 24.9 kg/m? as calculated from the following: ?  Height as of this encounter: 5\' 4"  (1.626 m). ?  Weight as of this encounter: 65.8 kg.  ? ?Code status: ?  Code Status: DNR  ? ?DVT Prophylaxis: ?SCDs Start: 07/24/21 0031 ?  ?Family Communication: Spouse-Hollis-[phone]-left voicemail on 3/19 ? ? ?Disposition Plan: ?Status is: Observation ?The patient will require care spanning > 2 midnights and should be moved to inpatient because: Ongoing orthostatic hypotension-hydrating with IVF today. ?  ?Planned Discharge Destination: SNF recommended by physical therapy. ? ? ?Diet: ?Diet Order   ? ?       ?  Diet Heart Room service appropriate? Yes; Fluid consistency: Thin  Diet effective now       ?  ? ?  ?  ? ?  ?  ? ? ?Antimicrobial agents: ?Anti-infectives (From admission, onward)  ? ? None  ? ?  ? ? ? ?MEDICATIONS: ?Scheduled Meds: ? cyanocobalamin  1,000 mcg Subcutaneous Daily  ? levETIRAcetam  500 mg Oral BID  ? losartan  50 mg Oral Daily  ? metoprolol tartrate  25 mg Oral BID  ? simvastatin  40 mg Oral q1800  ? sodium chloride flush  3 mL Intravenous Q12H  ? ?Continuous Infusions: ? levETIRAcetam    ? ?PRN Meds:.acetaminophen **OR** acetaminophen, senna-docusate ? ? ?I have personally reviewed following labs and imaging studies ? ?LABORATORY DATA: ?CBC: ?Recent Labs  ?Lab 07/23/21 ?2010 07/24/21 ?0332  ?WBC 5.9 7.4  ?NEUTROABS 2.5  --   ?HGB 11.6* 11.6*  ?HCT 36.1 34.6*  ?MCV 97.0 92.3  ?PLT 255 254  ? ? ? ?Basic Metabolic Panel: ?Recent Labs  ?Lab 07/23/21 ?2010 07/24/21 ?0332  ?NA 130* 134*  ?K 4.5 3.7  ?CL 94* 99  ?CO2 24 28  ?GLUCOSE 94 88  ?BUN 10 7*  ?CREATININE 0.86 0.83  ?CALCIUM 9.6 9.5  ?MG  --  1.8  ? ? ? ?GFR: ?Estimated Creatinine Clearance: 45.4 mL/min (by C-G formula based on SCr of 0.83  mg/dL). ? ?Liver Function Tests: ?Recent Labs  ?Lab 07/23/21 ?2223  ?AST 23  ?ALT 10  ?ALKPHOS 33*  ?BILITOT 0.7  ?PROT 5.9*  ?ALBUMIN 3.4*  ? ? ?No results for input(s): LIPASE, AMYLASE in the last 168 hours. ?No results for input(s): AMMONIA in the last 168 hours. ? ?Coagulation Profile: ?No results for input(s): INR, PROTIME in the last 168 hours. ? ?Cardiac Enzymes: ?No results for input(s): CKTOTAL, CKMB, CKMBINDEX, TROPONINI in the last 168 hours. ? ?BNP (last 3 results) ?No results for input(s): PROBNP in the last 8760 hours. ? ?Lipid Profile: ?No results for input(s): CHOL, HDL, LDLCALC, TRIG, CHOLHDL, LDLDIRECT in the last 72 hours. ? ?Thyroid Function Tests: ?No results for input(s): TSH, T4TOTAL, FREET4, T3FREE, THYROIDAB in the last 72 hours. ? ?Anemia Panel: ?No results for input(s): VITAMINB12, FOLATE, FERRITIN, TIBC, IRON, RETICCTPCT in the last 72 hours. ? ?Urine analysis: ?   ?Component Value Date/Time  ? COLORURINE YELLOW 07/23/2021 2014  ? APPEARANCEUR CLEAR 07/23/2021 2014  ? LABSPEC 1.012 07/23/2021 2014  ? PHURINE 7.0 07/23/2021 2014  ? GLUCOSEU NEGATIVE 07/23/2021 2014  ? HGBUR NEGATIVE  07/23/2021 2014  ? BILIRUBINUR NEGATIVE 07/23/2021 2014  ? BILIRUBINUR negative 10/13/2015 1308  ? Lavenia Atlas NEGATIVE 07/23/2021 2014  ? PROTEINUR NEGATIVE 07/23/2021 2014  ? UROBILINOGEN 0.2 10/13/2015 1308  ? UROBILINOGEN 1.0 02/11/2009 1432  ? NITRITE NEGATIVE 07/23/2021 2014  ? LEUKOCYTESUR NEGATIVE 07/23/2021 2014  ? ? ?Sepsis Labs: ?Lactic Acid, Venous ?No results found for: LATICACIDVEN ? ?MICROBIOLOGY: ?Recent Results (from the past 240 hour(s))  ?Resp Panel by RT-PCR (Flu A&B, Covid) Nasopharyngeal Swab     Status: None  ? Collection Time: 07/23/21  9:57 PM  ? Specimen: Nasopharyngeal Swab; Nasopharyngeal(NP) swabs in vial transport medium  ?Result Value Ref Range Status  ? SARS Coronavirus 2 by RT PCR NEGATIVE NEGATIVE Final  ?  Comment: (NOTE) ?SARS-CoV-2 target nucleic acids are NOT DETECTED. ? ?The  SARS-CoV-2 RNA is generally detectable in upper respiratory ?specimens during the acute phase of infection. The lowest ?concentration of SARS-CoV-2 viral copies this assay can detect is ?138 copies/mL. A negative result doe

## 2021-07-25 NOTE — Progress Notes (Signed)
Pt and daughter, Sharyn Lull, at bedside, requesting for medical staff to update Sharyn Lull about pt discharge planning and general medical information.  Pts spouse is next of kin contact, but pt and Sharyn Lull would like for daughter to be updated first and Sharyn Lull will update pts spouse.  MD notified.  Sharyn Lull can be reached by phone at [phone].  Will continue to monitor pt. ? ?

## 2021-07-25 NOTE — Progress Notes (Signed)
Orthopedic Tech Progress Note ?Patient Details:  ?Felicia Cross ?1932-09-13 ?[id number] ? ?Maggie (RN), removed the mi[place] j cervical collar and I applied the soft collar. ? ?Ortho Devices ?Type of Ortho Device: Soft collar ?Ortho Device/Splint Location: on pt, c-spine ?Ortho Device/Splint Interventions: Ordered, Application ?  ?Post Interventions ?Patient Tolerated: Well ?Instructions Provided: Care of device, Adjustment of device ? ?[name] Carmine Savoy ?07/25/2021, 1:20 PM ? ?

## 2021-07-26 LAB — GLUCOSE, CAPILLARY: Glucose-Capillary: 72 mg/dL (ref 70–99)

## 2021-07-26 NOTE — [place] FL2 (Signed)
?[place] MEDICAID FL2 LEVEL OF CARE SCREENING TOOL  ?  ? ?IDENTIFICATION  ?Patient Name: ?Felicia Cross Birthdate: November 28, 1932 Sex: female Admission Date (Current Location): ?07/23/2021  ?[place] and [place] Number: ? [place] ?  Facility and Address:  ?[facility], [place] [address], [place], [place] [address] ?     Provider Number: ?[id number]  ?Attending Physician Name and Address:  ?Jonetta Osgood, MD ? Relative Name and Phone Number:  ?  ?   ?Current Level of Care: ?Hospital Recommended Level of Care: ?[place] Prior Approval Number: ?  ? ?Date Approved/Denied: ?  PASRR Number: ?[id number] ? ?Discharge Plan: ?SNF ?  ? ?Current Diagnoses: ?Patient Active Problem List  ? Diagnosis Date Noted  ? Seizure ([place]) 07/24/2021  ? Syncope 07/23/2021  ? Hyponatremia 07/23/2021  ? Traumatic hematoma of forehead 07/23/2021  ? Frequent falls 12/29/2020  ? Mixed hyperlipidemia 12/29/2020  ? Vertigo 07/06/2018  ? B12 deficiency 01/18/2016  ? Anemia 01/18/2016  ? S/P total knee replacement 09/14/2015  ? Carotid arterial disease ([place]) 05/27/2009  ? Dyslipidemia 06/27/2008  ? Coronary atherosclerosis 11/06/2006  ? Essential hypertension 03/07/2006  ? Osteoarthritis 03/07/2006  ? ? ?Orientation RESPIRATION BLADDER Height & Weight   ?  ?Self, Time, Situation, Place ? Normal Incontinent, External catheter Weight: 156 lb 12 oz (71.1 kg) ?Height:  5\' 4"  (162.6 cm)  ?BEHAVIORAL SYMPTOMS/MOOD NEUROLOGICAL BOWEL NUTRITION STATUS  ?    Continent Diet (See dc summary)  ?AMBULATORY STATUS COMMUNICATION OF NEEDS Skin   ?Limited Assist Verbally Other (Comment) (abraison on face) ?  ?  ?  ?    ?     ?     ? ? ?Personal Care Assistance Level of Assistance  ?Bathing, Feeding, Dressing Bathing Assistance: Maximum assistance ?Feeding assistance: Limited assistance ?Dressing Assistance: Limited assistance ?   ? ?Functional Limitations Info  ?    ?  ?   ? ? ?SPECIAL CARE FACTORS FREQUENCY  ?PT (By  licensed PT), OT (By licensed OT)   ?  ?PT Frequency: 5x/week ?OT Frequency: 5x/week ?  ?  ?  ?   ? ? ?Contractures Contractures Info: Not present  ? ? ?Additional Factors Info  ?Code Status, Allergies Code Status Info: DNR ?Allergies Info: Codeine, Hydrocodone ?  ?  ?  ?   ? ?Current Medications (07/26/2021):  This is the current hospital active medication list ?Current Facility-Administered Medications  ?Medication Dose Route Frequency Provider Last Rate Last Admin  ? acetaminophen (TYLENOL) tablet 650 mg  650 mg Oral Q6H PRN Lenore Cordia, MD   650 mg at 07/25/21 1643  ? Or  ? acetaminophen (TYLENOL) suppository 650 mg  650 mg Rectal Q6H PRN Lenore Cordia, MD      ? cyanocobalamin ((VITAMIN B-12)) injection 1,000 mcg  1,000 mcg Subcutaneous Daily Jonetta Osgood, MD   1,000 mcg at 07/26/21 [id number]  ? hydrALAZINE (APRESOLINE) injection 10 mg  10 mg Intravenous Q6H PRN Jonetta Osgood, MD   10 mg at 07/25/21 1746  ? levETIRAcetam (KEPPRA) tablet 500 mg  500 mg Oral BID Donnetta Simpers, MD   500 mg at 07/26/21 [id number]  ? Or  ? levETIRAcetam (KEPPRA) IVPB 500 mg/100 mL premix  500 mg Intravenous BID Donnetta Simpers, MD      ? losartan (COZAAR) tablet 50 mg  50 mg Oral Daily Jonetta Osgood, MD   50 mg at 07/26/21 [id number]  ? metoprolol tartrate (LOPRESSOR) tablet  25 mg  25 mg Oral BID Jonetta Osgood, MD   25 mg at 07/26/21 [id number]  ? senna-docusate (Senokot-S) tablet 1 tablet  1 tablet Oral QHS PRN Lenore Cordia, MD      ? simvastatin (ZOCOR) tablet 40 mg  40 mg Oral q1800 Lenore Cordia, MD   40 mg at 07/25/21 1654  ? sodium chloride flush (NS) 0.9 % injection 3 mL  3 mL Intravenous Q12H Lenore Cordia, MD   3 mL at 07/26/21 [id number]  ? ? ? ?Discharge Medications: ?Please see discharge summary for a list of discharge medications. ? ?Relevant Imaging Results: ? ?Relevant Lab Results: ? ? ?Additional Information ?ssn: [id number]. Pfizer COVID-19 Vaccine 07/11/2019, 06/17/2019. ? ?Benard Halsted, LCSW ? ? ? ? ?

## 2021-07-26 NOTE — Progress Notes (Signed)
?      ?                 PROGRESS NOTE ? ?      ?PATIENT DETAILS ?Name: Felicia Cross ?Age: 88 y.o. ?Sex: female ?Date of Birth: 23-Oct-1932 ?Admit Date: 07/23/2021 ?Admitting Physician Maretta Bees, MD ?[id number], Bettey Mare, MD ? ?Brief Summary: ?Patient is a 88 y.o.  female with history of CAD, HTN, HLD who presented to the ED following a fall-while in the ED-she was noted to have seizure.  Evaluated by neurology and started on Keppra. ? ? ?Significant events: ?3/17>> admit to [facility] for fall-while in the ED found to have seizure. ?3/18>> orthostatic vital signs positive-fell while in the bathroom-and landed in her gluteal area. ?3/19>> orthostatic vital signs continue to be positive-starting IVF ? ?Significant studies: ?3/17>> CXR: No PNA ?3/17>> x-ray pelvis: No fracture ?3/17>> CT head: Moderate left extracranial hematoma over frontal bone-no acute intracranial abnormality ?3/17>> CT C-spine: No traumatic injury ?3/17>> MRI brain: Trace posttraumatic intraventricular hemorrhage layering in the left occipital horn, no other acute intracranial abnormality. ?3/17>> MRI C-spine: Evidence of mild posttraumatic ligamentous injury at C1-C2.  No cord contusion or other traumatic injury. ?3/18>> EEG: No seizures ?3/18>> Echo: EF 60-65%, grade 1 diastolic dysfunction ? ?Significant microbiology data: ?3/17>> COVID/influenza PCR: Negative ? ?Procedures: ?None ? ?Consults: ?Neurology ? ?Subjective: ?Lying comfortably in bed-no major issues overnight.  Denies any chest pain or shortness of breath. ? ?Objective: ?Vitals: ?Blood pressure (!) 169/99, pulse 86, temperature 98.4 ?F (36.9 ?C), temperature source Oral, resp. rate 18, height 5\' 4"  (1.626 m), weight 71.1 kg, SpO2 98 %.  ? ?Exam: ?Gen Exam:Alert awake-not in any distress ?HEENT:atraumatic, normocephalic ?Chest: B/L clear to auscultation anteriorly ?CVS:S1S2 regular ?Abdomen:soft non tender, non distended ?Extremities:no edema ?Neurology: Non focal ?Skin: no rash   ? ?Pertinent Labs/Radiology: ?CBC Latest Ref Rng & Units 07/24/2021 07/23/2021 07/12/2021  ?WBC 4.0 - 10.5 K/uL 7.4 5.9 3.9(L)  ?Hemoglobin 12.0 - 15.0 g/dL 11.6(L) 11.6(L) 11.6(L)  ?Hematocrit 36.0 - 46.0 % 34.6(L) 36.1 33.8(L)  ?Platelets 150 - 400 K/uL 254 255 262.0  ?  ?Lab Results  ?Component Value Date  ? NA 134 (L) 07/24/2021  ? K 3.7 07/24/2021  ? CL 99 07/24/2021  ? CO2 28 07/24/2021  ?  ? ? ?Assessment/Plan: ?Syncope: Suspect due to orthostatic hypotension-orthostatic vital signs positive on 3/18 and 3/19.  Probably due to diuretic use.  Telemetry negative for arrhythmias-echo with stable EF.  Continue supportive care-allow some amount of permissive hypertension-awaiting recheck of orthostatic vital signs today. ? ?Seizure: Likely posttraumatic (following syncope)-she has trace posttraumatic IVH in the left occipital horn.  Doubt the initial syncopal event was a seizure-but not sure at this point.  In any event--evaluated by neurology and has been started on Keppra 500 mg twice daily x6 weeks, then to 50 mg twice daily x1 week and stop. ? ?Traumatic hematoma to left frontal forehead/trace posttraumatic intraventricular hemorrhage layering in the left occipital horn, mild posttraumatic ligamentous injury at C1-C2: Likely sequelae of fall/syncope at home.  Chart reviewed with trauma MD-Dr. 4/19 and then neurosurgeon-Dr. Donell Beers on 3/18-recommendations are to continue supportive care, neurosurgery recommended soft collar x2 weeks.  Hardly any neck pain today. ? ?Orthostatic hypotension: Difficult situation-see above-note on 3/18-patient sustained a fall while in the bathroom and landed in her gluteal area.  Awaiting recheck of orthostatic vital signs today-no longer on HCTZ.  Allow some amount of resting permissive hypertension. ? ?Vitamin  B12 deficiency: Continue supplementation ? ?HTN: Allow permissive hypertension-continue metoprolol and losartan-HCTZ on hold.   ? ?HLD: Continue statin ? ?Hyponatremia:  Mild-no clinical significance. ? ?BMI: ?Estimated body mass index is 26.91 kg/m? as calculated from the following: ?  Height as of this encounter: 5\' 4"  (1.626 m). ?  Weight as of this encounter: 71.1 kg.  ? ?Code status: ?  Code Status: DNR  ? ?DVT Prophylaxis: ?SCDs Start: 07/24/21 0031 ?  ?Family Communication:  ?Daughter [phone] left a voicemail on 3/20  ?Spouse-Hollis-[phone]-left voicemail on 3/19 ? ? ?Disposition Plan: ?Status is: Observation ?The patient will require care spanning > 2 midnights and should be moved to inpatient because: Ongoing orthostatic hypotension-awaiting SNF ?  ?Planned Discharge Destination: SNF recommended by physical therapy. ? ? ?Diet: ?Diet Order   ? ?       ?  Diet Heart Room service appropriate? No; Fluid consistency: Thin  Diet effective now       ?  ? ?  ?  ? ?  ?  ? ? ?Antimicrobial agents: ?Anti-infectives (From admission, onward)  ? ? None  ? ?  ? ? ? ?MEDICATIONS: ?Scheduled Meds: ? cyanocobalamin  1,000 mcg Subcutaneous Daily  ? levETIRAcetam  500 mg Oral BID  ? losartan  50 mg Oral Daily  ? metoprolol tartrate  25 mg Oral BID  ? simvastatin  40 mg Oral q1800  ? sodium chloride flush  3 mL Intravenous Q12H  ? ?Continuous Infusions: ? levETIRAcetam    ? ?PRN Meds:.acetaminophen **OR** acetaminophen, hydrALAZINE, senna-docusate ? ? ?I have personally reviewed following labs and imaging studies ? ?LABORATORY DATA: ?CBC: ?Recent Labs  ?Lab 07/23/21 ?2010 07/24/21 ?0332  ?WBC 5.9 7.4  ?NEUTROABS 2.5  --   ?HGB 11.6* 11.6*  ?HCT 36.1 34.6*  ?MCV 97.0 92.3  ?PLT 255 254  ? ? ? ?Basic Metabolic Panel: ?Recent Labs  ?Lab 07/23/21 ?2010 07/24/21 ?0332  ?NA 130* 134*  ?K 4.5 3.7  ?CL 94* 99  ?CO2 24 28  ?GLUCOSE 94 88  ?BUN 10 7*  ?CREATININE 0.86 0.83  ?CALCIUM 9.6 9.5  ?MG  --  1.8  ? ? ? ?GFR: ?Estimated Creatinine Clearance: 47.1 mL/min (by C-G formula based on SCr of 0.83 mg/dL). ? ?Liver Function Tests: ?Recent Labs  ?Lab 07/23/21 ?2223  ?AST 23  ?ALT 10   ?ALKPHOS 33*  ?BILITOT 0.7  ?PROT 5.9*  ?ALBUMIN 3.4*  ? ? ?No results for input(s): LIPASE, AMYLASE in the last 168 hours. ?No results for input(s): AMMONIA in the last 168 hours. ? ?Coagulation Profile: ?No results for input(s): INR, PROTIME in the last 168 hours. ? ?Cardiac Enzymes: ?No results for input(s): CKTOTAL, CKMB, CKMBINDEX, TROPONINI in the last 168 hours. ? ?BNP (last 3 results) ?No results for input(s): PROBNP in the last 8760 hours. ? ?Lipid Profile: ?No results for input(s): CHOL, HDL, LDLCALC, TRIG, CHOLHDL, LDLDIRECT in the last 72 hours. ? ?Thyroid Function Tests: ?No results for input(s): TSH, T4TOTAL, FREET4, T3FREE, THYROIDAB in the last 72 hours. ? ?Anemia Panel: ?No results for input(s): VITAMINB12, FOLATE, FERRITIN, TIBC, IRON, RETICCTPCT in the last 72 hours. ? ?Urine analysis: ?   ?Component Value Date/Time  ? COLORURINE YELLOW 07/23/2021 2014  ? APPEARANCEUR CLEAR 07/23/2021 2014  ? LABSPEC 1.012 07/23/2021 2014  ? PHURINE 7.0 07/23/2021 2014  ? GLUCOSEU NEGATIVE 07/23/2021 2014  ? HGBUR NEGATIVE 07/23/2021 2014  ? BILIRUBINUR NEGATIVE 07/23/2021 2014  ? BILIRUBINUR negative 10/13/2015 1308  ? Lavenia AtlasKETONESUR  NEGATIVE 07/23/2021 2014  ? PROTEINUR NEGATIVE 07/23/2021 2014  ? UROBILINOGEN 0.2 10/13/2015 1308  ? UROBILINOGEN 1.0 02/11/2009 1432  ? NITRITE NEGATIVE 07/23/2021 2014  ? LEUKOCYTESUR NEGATIVE 07/23/2021 2014  ? ? ?Sepsis Labs: ?Lactic Acid, Venous ?No results found for: LATICACIDVEN ? ?MICROBIOLOGY: ?Recent Results (from the past 240 hour(s))  ?Resp Panel by RT-PCR (Flu A&B, Covid) Nasopharyngeal Swab     Status: None  ? Collection Time: 07/23/21  9:57 PM  ? Specimen: Nasopharyngeal Swab; Nasopharyngeal(NP) swabs in vial transport medium  ?Result Value Ref Range Status  ? SARS Coronavirus 2 by RT PCR NEGATIVE NEGATIVE Final  ?  Comment: (NOTE) ?SARS-CoV-2 target nucleic acids are NOT DETECTED. ? ?The SARS-CoV-2 RNA is generally detectable in upper respiratory ?specimens during the  acute phase of infection. The lowest ?concentration of SARS-CoV-2 viral copies this assay can detect is ?138 copies/mL. A negative result does not preclude SARS-Cov-2 ?infection and should not be used as the sol

## 2021-07-26 NOTE — TOC Initial Note (Addendum)
Transition of Care (TOC) - Initial/Assessment Note  ? ? ?Patient Details  ?Name: Felicia Cross ?MRN: [id number] ?Date of Birth: 05/31/1932 ? ?Transition of Care (TOC) CM/SW Contact:    ?Mearl Latin, LCSW ?Phone Number: ?07/26/2021, 11:17 AM ? ?Clinical Narrative:                 ?11:17am-CSW received consult for possible SNF placement at time of discharge. CSW spoke with patient. Patient reported that patient's spouse is currently unable to care for patient at their home given patient?s current physical needs and fall risk. Patient expressed understanding of PT recommendation and is agreeable to SNF placement at time of discharge. CSW discussed insurance authorization process and will provide Medicare SNF ratings list. Patient has received COVID vaccines. CSW will send out referrals for review. Patient expressed being hopeful for rehab and to feel better soon. She requested CSW call her daughter to go over the SNF options and daughter can relay info to her husband.  ? ?3:30pm-CSW received return call from [place]. She provided email address and CSW emailed her SNF options and ratings list for review ([email or web address]). She stated her sister [name] is POA for her parents and she will communicate with her. CSW submitted clinicals for review to insurance pending a facility choice, Ref# [id number].  ? ?Skilled Nursing Rehab Facilities-   [email or web address]   Ratings out of 5 possible   ?Name Address  Phone # Quality Care Staffing Health Inspection Overall  ?[facility] [address] [place] [phone] 5 5 2 4   ?[facility]  [address], [place] [phone] 4 2 5 5   ?[facility] [address] [place], [address] [place] 4 1 1 1   ?[facility] [address] 2 2 4 4   ?[facility] [address], [phone] 1 1 2 1   ?[facility] [address] [place] [phone] 2 1 4  3   ?[facility] [address] [place] 5 2 2 3   ?[facility] [address], [facility] [place] 5 2 2 3   ?[address] ([place]) [address], [facility] 5 1 2 2   ?[facility] [address], [place] [phone] 4 1 1 1   ?[facility] [address] [place] [phone] 4 1 2 1   ?[facility] [address] [facility] [place] Otto 4 1 1 1   ?        ?[facility] [address], [facility] [address]      ?[facility] [address], [facility] 4 2 3 3   ?[facility] [place] [address], [place] Matas [phone] 3 1 5 4   ?[facility] [place] [facility] [address], [place] [phone] 2 1 1 1   ?[facility] [address], [place] [phone] 2 2 3 3   ?        ?[address] (no [facility]) [address], [phone] 4 5 5 5   ?[facility] (No Hum[employer]) [address] [place], [address] [place] 4 1 4 3   ?[facility] (No [facility]) [address] [place] [facility] 5 5 5 5   ?[facility] [address] [place] [phone] 3 2 4 4   ?[address] [place] [phone] 3 3 4 4   ?[facility] [address] [place] 1 1 2 1   ?[facility] [address] [place] [phone] 2 1 1 1   ?[facility] [address] [place], [phone] 5 2  4 5  ?[facility] [address], [place] [phone] 3 1 1 1   ?[facility] [address] [place], [place] 2 1 2 1   ?        ?[facility] [address], [facility] [address] 1 1 1 1   ?[place] [address],  [phone] 2 4 2 2   ?[facility] [place] [address], Evlyn Clines [phone] 5 2 3 4   ?[facility] [place] [address] [place]alita Levan [phone] 2 1 1 1   ?[facility] (No Humana) [address] [facility] [address] 2 1 3 2   ?[facility] [address]  [phone] 3 1 1 1   ?        ?[facility] [address] [place], [facility] [address] 5 4 5 5   ?[facility])  [address] [facility] [address] 2 2 3 3   ?[facility]) [address], [facility] 3 2 4 4   ?[facility] [address], [place] [phone] 4 3 4 4   ?[address] [place], [place] 3 3 1 1   ?[facility]) [address] [place] [phone] 2 2 4 4   ? ? ? ?Expected Discharge Plan: Skilled Nursing Facility ?Barriers to Discharge: SNF Pending bed offer, Insurance Authorization ? ? ?Patient Goals and CMS Choice ?Patient states their goals for this hospitalization and ongoing recovery are:: Rehab ?CMS Medicare.gov Compare Post Acute Care list provided to:: Patient ?Choice offered to / list presented to : Patient, Adult Children ? ?Expected Discharge Plan and Services ?Expected Discharge Plan: Skilled Nursing Facility ?In-house Referral: Clinical Social Work ?  ?Post Acute Care Choice: Skilled Nursing Facility ?Living arrangements for the past 2 months: Single Family Home ?                ?  ?  ?  ?  ?  ?  ?  ?  ?  ?  ? ?Prior Living Arrangements/Services ?Living arrangements for the past 2 months: Single Family Home ?Lives with:: Spouse ?Patient language and need for interpreter reviewed:: Yes ?Do you feel safe going back to the place where you live?: Yes      ?Need for Family Participation in Patient Care: Yes (Comment) ?Care giver support system in place?: Yes (comment) ?  ?Criminal Activity/Legal Involvement Pertinent to Current Situation/Hospitalization: No - Comment as needed ? ?Activities of Daily Living ?Home Assistive Devices/Equipment: C-collar, Eyeglasses, Walker (specify type) ?ADL Screening (condition at time of admission) ?Patient's cognitive ability adequate to safely complete daily activities?: Yes ?Is the patient deaf or have difficulty hearing?: No ?Does the patient have  difficulty seeing, even when wearing glasses/contacts?: No ?Does the patient have difficulty concentrating, remembering, or making decisions?: No ?Patient able to express need for assistance with ADLs?: Yes ?Does the patient have difficulty dressing or bathing?: No ?Independently performs ADLs?: Yes (appropriate for developmental age) ?Does the patient have difficulty walking or climbing stairs?: Yes ?Weakness of Legs: Both ?Weakness of Arms/Hands: Both ? ?Permission Sought/Granted ?Permission sought to share information with : Facility [place], Family Supports ?Permission granted to share information with : Yes, Verbal Permission Granted ? Share Information with NAME: [place] ? Permission granted to share info w AGENCY: SNFs ? Permission granted to share info w Relationship: Daughter ? Permission granted to share info w Contact Information: [phone] ? ?Emotional Assessment ?Appearance:: Appears stated age ?Attitude/Demeanor/Rapport: Engaged ?Affect (typically observed): Accepting, Appropriate, Pleasant ?Orientation: :  Oriented to Self, Oriented to Place, Oriented to  Time, Oriented to Situation ?Alcohol / Substance Use: Not Applicable ?Psych Involvement: No (comment) ? ?Admission diagnosis:  Spasm [R25.2] ?Syncope [R55] ?Fall, initial encounter [W19.XXXA] ?Concussion with loss of consciousness, initial encounter [S06.0X9A] ?Contusion of head, unspecified part of head, initial encounter [S00.93XA] ?Seizure (HCC) [R56.9] ?Patient Active Problem List  ? Diagnosis Date Noted  ? Seizure (HCC) 07/24/2021  ? Syncope 07/23/2021  ? Hyponatremia 07/23/2021  ? Traumatic hematoma of forehead 07/23/2021  ? Frequent falls 12/29/2020  ? Mixed hyperlipidemia 12/29/2020  ? Vertigo 07/06/2018  ? B12 deficiency 01/18/2016  ? Anemia 01/18/2016  ? S/P total knee replacement 09/14/2015  ? Carotid arterial disease (HCC) 05/27/2009  ? Dyslipidemia 06/27/2008  ? Coronary atherosclerosis 11/06/2006  ? Essential  hypertension 03/07/2006  ? Osteoarthritis 03/07/2006  ? ?PCP:  Deeann SaintBanks, Shannon R, MD ?Pharmacy:   ?[facility] [address] - [place], [place] - [address] ?[address] ?[place] [phone] ?Ph

## 2021-07-27 DIAGNOSIS — W19XXXA Unspecified fall, initial encounter: Secondary | ICD-10-CM | POA: Diagnosis not present

## 2021-07-27 DIAGNOSIS — I10 Essential (primary) hypertension: Secondary | ICD-10-CM | POA: Diagnosis not present

## 2021-07-27 DIAGNOSIS — S0003XD Contusion of scalp, subsequent encounter: Secondary | ICD-10-CM | POA: Diagnosis not present

## 2021-07-27 DIAGNOSIS — Z79899 Other long term (current) drug therapy: Secondary | ICD-10-CM | POA: Diagnosis not present

## 2021-07-27 DIAGNOSIS — N390 Urinary tract infection, site not specified: Secondary | ICD-10-CM | POA: Diagnosis not present

## 2021-07-27 DIAGNOSIS — R531 Weakness: Secondary | ICD-10-CM | POA: Diagnosis not present

## 2021-07-27 DIAGNOSIS — S0181XA Laceration without foreign body of other part of head, initial encounter: Secondary | ICD-10-CM | POA: Diagnosis not present

## 2021-07-27 DIAGNOSIS — R2689 Other abnormalities of gait and mobility: Secondary | ICD-10-CM | POA: Diagnosis not present

## 2021-07-27 DIAGNOSIS — I2510 Atherosclerotic heart disease of native coronary artery without angina pectoris: Secondary | ICD-10-CM | POA: Diagnosis not present

## 2021-07-27 DIAGNOSIS — M6259 Muscle wasting and atrophy, not elsewhere classified, multiple sites: Secondary | ICD-10-CM | POA: Diagnosis not present

## 2021-07-27 DIAGNOSIS — R404 Transient alteration of awareness: Secondary | ICD-10-CM | POA: Diagnosis not present

## 2021-07-27 DIAGNOSIS — E871 Hypo-osmolality and hyponatremia: Secondary | ICD-10-CM | POA: Diagnosis not present

## 2021-07-27 DIAGNOSIS — S0003XA Contusion of scalp, initial encounter: Secondary | ICD-10-CM | POA: Diagnosis not present

## 2021-07-27 DIAGNOSIS — D649 Anemia, unspecified: Secondary | ICD-10-CM | POA: Diagnosis not present

## 2021-07-27 DIAGNOSIS — Z9181 History of falling: Secondary | ICD-10-CM | POA: Diagnosis not present

## 2021-07-27 DIAGNOSIS — I6529 Occlusion and stenosis of unspecified carotid artery: Secondary | ICD-10-CM | POA: Diagnosis not present

## 2021-07-27 DIAGNOSIS — S0083XA Contusion of other part of head, initial encounter: Secondary | ICD-10-CM | POA: Diagnosis not present

## 2021-07-27 DIAGNOSIS — M19039 Primary osteoarthritis, unspecified wrist: Secondary | ICD-10-CM | POA: Diagnosis not present

## 2021-07-27 DIAGNOSIS — Z7401 Bed confinement status: Secondary | ICD-10-CM | POA: Diagnosis not present

## 2021-07-27 DIAGNOSIS — S134XXA Sprain of ligaments of cervical spine, initial encounter: Secondary | ICD-10-CM | POA: Diagnosis not present

## 2021-07-27 DIAGNOSIS — Z8679 Personal history of other diseases of the circulatory system: Secondary | ICD-10-CM | POA: Diagnosis not present

## 2021-07-27 DIAGNOSIS — R55 Syncope and collapse: Secondary | ICD-10-CM | POA: Diagnosis not present

## 2021-07-27 DIAGNOSIS — I5189 Other ill-defined heart diseases: Secondary | ICD-10-CM | POA: Diagnosis not present

## 2021-07-27 DIAGNOSIS — S0990XA Unspecified injury of head, initial encounter: Secondary | ICD-10-CM | POA: Diagnosis not present

## 2021-07-27 DIAGNOSIS — R6889 Other general symptoms and signs: Secondary | ICD-10-CM | POA: Diagnosis not present

## 2021-07-27 DIAGNOSIS — R2681 Unsteadiness on feet: Secondary | ICD-10-CM | POA: Diagnosis not present

## 2021-07-27 DIAGNOSIS — Z8673 Personal history of transient ischemic attack (TIA), and cerebral infarction without residual deficits: Secondary | ICD-10-CM | POA: Diagnosis not present

## 2021-07-27 DIAGNOSIS — E785 Hyperlipidemia, unspecified: Secondary | ICD-10-CM | POA: Diagnosis not present

## 2021-07-27 DIAGNOSIS — W07XXXA Fall from chair, initial encounter: Secondary | ICD-10-CM | POA: Diagnosis not present

## 2021-07-27 DIAGNOSIS — M25512 Pain in left shoulder: Secondary | ICD-10-CM | POA: Diagnosis not present

## 2021-07-27 DIAGNOSIS — Z743 Need for continuous supervision: Secondary | ICD-10-CM | POA: Diagnosis not present

## 2021-07-27 DIAGNOSIS — S0993XA Unspecified injury of face, initial encounter: Secondary | ICD-10-CM | POA: Diagnosis not present

## 2021-07-27 DIAGNOSIS — R58 Hemorrhage, not elsewhere classified: Secondary | ICD-10-CM | POA: Diagnosis not present

## 2021-07-27 DIAGNOSIS — M6281 Muscle weakness (generalized): Secondary | ICD-10-CM | POA: Diagnosis not present

## 2021-07-27 DIAGNOSIS — R569 Unspecified convulsions: Secondary | ICD-10-CM | POA: Diagnosis not present

## 2021-07-27 DIAGNOSIS — S134XXD Sprain of ligaments of cervical spine, subsequent encounter: Secondary | ICD-10-CM | POA: Diagnosis not present

## 2021-07-27 DIAGNOSIS — E538 Deficiency of other specified B group vitamins: Secondary | ICD-10-CM | POA: Diagnosis not present

## 2021-07-27 DIAGNOSIS — Z7982 Long term (current) use of aspirin: Secondary | ICD-10-CM | POA: Diagnosis not present

## 2021-07-27 DIAGNOSIS — R1319 Other dysphagia: Secondary | ICD-10-CM | POA: Diagnosis not present

## 2021-07-27 DIAGNOSIS — R748 Abnormal levels of other serum enzymes: Secondary | ICD-10-CM | POA: Diagnosis not present

## 2021-07-27 DIAGNOSIS — S06360D Traumatic hemorrhage of cerebrum, unspecified, without loss of consciousness, subsequent encounter: Secondary | ICD-10-CM | POA: Diagnosis not present

## 2021-07-27 DIAGNOSIS — R319 Hematuria, unspecified: Secondary | ICD-10-CM | POA: Diagnosis not present

## 2021-07-27 DIAGNOSIS — I951 Orthostatic hypotension: Secondary | ICD-10-CM | POA: Diagnosis not present

## 2021-07-27 LAB — GLUCOSE, CAPILLARY: Glucose-Capillary: 89 mg/dL (ref 70–99)

## 2021-07-27 MED ORDER — LEVETIRACETAM 500 MG PO TABS
ORAL_TABLET | ORAL | Status: DC
Start: 2021-07-27 — End: 2021-07-27

## 2021-07-27 MED ORDER — VITAMIN B-12 1000 MCG PO TABS: 1000.0000 ug | ORAL_TABLET | Freq: Every day | ORAL | Status: DC

## 2021-07-27 MED ORDER — LEVETIRACETAM 500 MG PO TABS
ORAL_TABLET | ORAL | Status: DC
Start: 2021-07-27 — End: 2023-08-17

## 2021-07-27 NOTE — TOC Progression Note (Signed)
Transition of Care (TOC) - Progression Note  ? ? ?Patient Details  ?Name: Felicia Cross ?MRN: [id number] ?Date of Birth: Jul 03, 1932 ? ?Transition of Care (TOC) CM/SW Contact  ?Mearl Latin, LCSW ?Phone Number: ?07/27/2021, 9:45 AM ? ?Clinical Narrative:    ?Daughters have selected [employer]. [place] able to accommodate her private caregiver as well during the hours of 8am-8pm. CSW updated insurance (Ref# [id number], approved for 07/26/2021-07/28/2021).  ? ? ?Expected Discharge Plan: Skilled Nursing Facility ?Barriers to Discharge: Barriers Resolved ? ?Expected Discharge Plan and Services ?Expected Discharge Plan: Skilled Nursing Facility ?In-house Referral: Clinical Social Work ?  ?Post Acute Care Choice: Skilled Nursing Facility ?Living arrangements for the past 2 months: Single Family Home ?                ?  ?  ?  ?  ?  ?  ?  ?  ?  ?  ? ? ?Social Determinants of Health (SDOH) Interventions ?  ? ?Readmission Risk Interventions ?No flowsheet data found. ? ?

## 2021-07-27 NOTE — Progress Notes (Signed)
Physical Therapy Treatment ?Patient Details ?Name: Felicia Cross ?MRN: [id number] ?DOB: 1932/05/20 ?Today's Date: 07/27/2021 ? ? ?History of Present Illness 88 y.o. female who presented to the ED for evaluation after a fall at home. CT head without contrast shows moderate extracranial hematoma overlying the left frontal bone. MRI Brain with small L occipital dependent IVH. MRI C spine with mild posttraumatic ligamentous injury at C1-C2. PMH significant for CAD, HTN, HLD ? ?  ?PT Comments  ? ? Patient more limited with mobility today--requiring mod assist for bed mobility and sit to stand. Pt unable to advance her feet or march in place. Attempted to do orthostatic BPs, however pt became incontinent of urine and with posterior lean needed to return to sitting for safety prior to getting standing BP. From supine to sit her BP elevated appropriately. Patient returned to bed and linens changed with pt rolling right and left.  ?   ?Recommendations for follow up therapy are one component of a multi-disciplinary discharge planning process, led by the attending physician.  Recommendations may be updated based on patient status, additional functional criteria and insurance authorization. ? ?Follow Up Recommendations ? Skilled nursing-short term rehab (<3 hours/day) ?  ?  ?Assistance Recommended at Discharge Frequent or constant Supervision/Assistance  ?Patient can return home with the following A little help with walking and/or transfers;Assistance with cooking/housework;Direct supervision/assist for medications management;Direct supervision/assist for financial management;Assist for transportation;Help with stairs or ramp for entrance ?  ?Equipment Recommendations ? None recommended by PT  ?  ?Recommendations for Other Services   ? ? ?  ?Precautions / Restrictions Precautions ?Precautions: Fall;Other (comment) ?Precaution Comments: seizure ?Required Braces or Orthoses: Cervical Brace ?Cervical Brace: At all times;Soft  collar ?Restrictions ?Weight Bearing Restrictions: No  ?  ? ?Mobility ? Bed Mobility ?Overal bed mobility: Needs Assistance ?Bed Mobility: Rolling, Sidelying to Sit, Sit to Sidelying ?Rolling: Min guard (rt and lt) ?Sidelying to sit: Mod assist ?  ?  ?Sit to sidelying: Mod assist ?General bed mobility comments: assist to move legs over EOB and to raise torso; both assisted with return to side>supine ?  ? ?Transfers ?Overall transfer level: Needs assistance ?Equipment used: Rolling walker (2 wheels) ?Transfers: Sit to/from Stand ?Sit to Stand: Mod assist ?  ?  ?  ?  ?  ?General transfer comment: difficulty coming to stand with 3 attempts before successful ?  ? ?Ambulation/Gait ?  ?  ?  ?  ?  ?  ?  ?General Gait Details: pt unable to advance feet or even march in place; then became incontinent of urine and returned to bed ? ? ?Stairs ?  ?  ?  ?  ?  ? ? ?Wheelchair Mobility ?  ? ?Modified Rankin (Stroke Patients Only) ?  ? ? ?  ?Balance Overall balance assessment: Needs assistance ?Sitting-balance support: Feet supported ?Sitting balance-Leahy Scale: Poor ?Sitting balance - Comments: poor to fair at times leaning backwards needing cues to correct ?  ?Standing balance support: Reliant on assistive device for balance, During functional activity ?Standing balance-Leahy Scale: Poor ?Standing balance comment: posterior lean ?  ?  ?  ?  ?  ?  ?  ?  ?  ?  ?  ?  ? ?  ?Cognition Arousal/Alertness: Awake/alert ?Behavior During Therapy: Flat affect ?Overall Cognitive Status: No family/caregiver present to determine baseline cognitive functioning ?  ?  ?  ?  ?  ?  ?  ?  ?  ?  ?  ?  ?  ?  ?  ?  ?  General Comments: Patient is alert and oriented x3 ?  ?  ? ?  ?Exercises   ? ?  ?General Comments   ?  ?  ? ?Pertinent Vitals/Pain Pain Assessment ?Pain Assessment: No/denies pain  ? ? ?Home Living   ?  ?  ?  ?  ?  ?  ?  ?  ?  ?   ?  ?Prior Function    ?  ?  ?   ? ?PT Goals (current goals can now be found in the care plan section) Acute  Rehab PT Goals ?Patient Stated Goal: to get better and stop falling ?Time For Goal Achievement: 08/07/21 ?Potential to Achieve Goals: Good ?Progress towards PT goals: Not progressing toward goals - comment (slight decline from prior session) ? ?  ?Frequency ? ? ? Min 2X/week ? ? ? ?  ?PT Plan Frequency needs to be updated  ? ? ?Co-evaluation   ?  ?  ?  ?  ? ?  ?AM-PAC PT "6 Clicks" Mobility   ?Outcome Measure ? Help needed turning from your back to your side while in a flat bed without using bedrails?: A Little ?Help needed moving from lying on your back to sitting on the side of a flat bed without using bedrails?: A Lot ?Help needed moving to and from a bed to a chair (including a wheelchair)?: A Lot ?Help needed standing up from a chair using your arms (e.g., wheelchair or bedside chair)?: A Lot ?Help needed to walk in hospital room?: Total ?Help needed climbing 3-5 steps with a railing? : Total ?6 Click Score: 11 ? ?  ?End of Session Equipment Utilized During Treatment: Gait belt ?Activity Tolerance: Patient limited by fatigue ?Patient left: with call bell/phone within reach;in bed;with bed alarm set ?Nurse Communication: Mobility status ?PT Visit Diagnosis: Unsteadiness on feet (R26.81);Muscle weakness (generalized) (M62.81) ?  ? ? ?Time: [id number] ?PT Time Calculation (min) (ACUTE ONLY): 31 min ? ?Charges:  $Therapeutic Activity: 23-37 mins          ?          ? ? ?Arby Barrette, PT ?Acute Rehabilitation Services  ?Pager [phone] ?Office [phone] ? ? ? ?Jeanie Cooks Sheree Lalla ?07/27/2021, 3:58 PM ? ?

## 2021-07-27 NOTE — Discharge Summary (Addendum)
? ?PATIENT DETAILS ?Name: Felicia Cross ?Age: 88 y.o. ?Sex: female ?Date of Birth: 1932/05/25 ?MRN: [id number]. ?Admitting Physician: Maretta BeesShanker M Gwendlyn Hanback, MD ?WUJ:[id number]:Banks, Bettey MareShannon R, MD ? ?Admit Date: 07/23/2021 ?Discharge date: 07/27/2021 ? ?Recommendations for Outpatient Follow-up:  ?Follow up with PCP in 1-2 weeks ?Please obtain CMP/CBC in one week ?Keppra x6 weeks-as documented below. ?Please ensure follow-up with neurology ?Cervical soft collar x2 weeks ?Repeat vitamin B12 level in 4 to 6 weeks. ? ?Admitted From:  ?Home ? ?Disposition: ?Skilled nursing facility ?  ?Discharge Condition: ?good ? ?CODE STATUS: ?  Code Status: DNR  ? ?Diet recommendation:  ?Diet Order   ? ?       ?  Diet - low sodium heart healthy       ?  ?  Diet Heart Room service appropriate? No; Fluid consistency: Thin  Diet effective now       ?  ? ?  ?  ? ?  ?  ? ?Brief Summary: ?Patient is a 88 y.o.  female with history of CAD, HTN, HLD who presented to the ED following a fall-while in the ED-she was noted to have seizure.  Evaluated by neurology and started on Keppra. ?  ?  ?Significant events: ?3/17>> [facility]-while in the ED found to have seizure. ?3/18>> orthostatic vital signs positive-fell while in the bathroom-and landed in her gluteal area. ?3/19>> orthostatic vital signs continue to be positive-starting IVF ?  ?Significant studies: ?3/17>> CXR: No PNA ?3/17>> x-ray pelvis: No fracture ?3/17>> CT head: Moderate left extracranial hematoma over frontal bone-no acute intracranial abnormality ?3/17>> CT C-spine: No traumatic injury ?3/17>> MRI brain: Trace posttraumatic intraventricular hemorrhage layering in the left occipital horn, no other acute intracranial abnormality. ?3/17>> MRI C-spine: Evidence of mild posttraumatic ligamentous injury at C1-C2.  No cord contusion or other traumatic injury. ?3/18>> EEG: No seizures ?3/18>> Echo: EF 60-65%, grade 1 diastolic dysfunction ?  ?Significant microbiology data: ?3/17>>  COVID/influenza PCR: Negative ?  ?Procedures: ?None ?  ?Consults: ?Neurology ? ?Brief Hospital Course: ?Syncope: Suspect due to orthostatic hypotension-orthostatic vital signs positive on 3/18 and 3/19.  Probably due to diuretic use.  Telemetry negative for arrhythmias-echo with stable EF.  Continue to hold diuretics on discharge-outpatient MD/attending MD to continue to optimize antihypertensive regimen.  Allow some amount of permissive hypertension. ?  ?Seizure: Likely posttraumatic (following syncope)-she has trace posttraumatic IVH in the left occipital horn.  Doubt the initial syncopal event was a seizure-but not sure at this point.  In any event--evaluated by neurology and has been started on Keppra 500 mg twice daily x6 weeks, then to 50 mg twice daily x1 week and stop. ?  ?Traumatic hematoma to left frontal forehead/trace posttraumatic intraventricular hemorrhage layering in the left occipital horn, mild posttraumatic ligamentous injury at C1-C2: Likely sequelae of fall/syncope at home.  Chart reviewed with trauma MD-Dr. Donell BeersByerly and then neurosurgeon-Dr. Danielle DessElsner on 3/18-recommendations are to continue supportive care, neurosurgery recommended soft collar x2 weeks.  Hardly any neck pain today.  If neck pain recur/persist-please arrange for outpatient follow-up with neurosurgeon-Dr. Danielle DessElsner. ?  ?Orthostatic hypotension: Difficult situation-see above-note on 3/18-patient sustained a fall while in the bathroom and landed in her gluteal area.  Continue to hold her diuretic-allow permissive hypertension.  Difficult situation-has resting hypertension. ?  ?Vitamin B12 deficiency: Continue supplementation-recheck levels in 4 to 6 weeks.  Given parenteral supplementation during this hospitalization, switching to oral route on discharge. ?  ?HTN: Allow permissive hypertension-continue metoprolol and losartan-HCTZ  on hold.   ?  ?HLD: Continue statin ?  ?Hyponatremia: Mild-no clinical significance. ?  ?BMI: ?Estimated body  mass index is 26.91 kg/m? as calculated from the following: ?  Height as of this encounter: 5\' 4"  (1.626 m). ?  Weight as of this encounter: 71.1 kg. ? ?Discharge Diagnoses:  ?Principal Problem: ?  Syncope ?Active Problems: ?  Hyponatremia ?  Essential hypertension ?  Coronary atherosclerosis ?  Traumatic hematoma of forehead ?  Dyslipidemia ?  Seizure (HCC) ? ? ?Discharge Instructions: ? ?Activity:  ?As tolerated with Full fall precautions use walker/cane & assistance as needed ? ?Discharge Instructions   ? ? Ambulatory referral to Neurology   Complete by: As directed ?  ? An appointment is requested in approximately: 4 weeks  ? Diet - low sodium heart healthy   Complete by: As directed ?  ? Discharge instructions   Complete by: As directed ?  ? Follow with Primary MD  , MD in 1-2 weeks ? ?Please get a complete blood count and chemistry panel checked by your Primary MD at your next visit, and again as instructed by your Primary MD. ? ?Get Medicines reviewed and adjusted: ?Please take all your medications with you for your next visit with your Primary MD ? ?Laboratory/radiological data: ?Please request your Primary MD to go over all hospital tests and procedure/radiological results at the follow up, please ask your Primary MD to get all Hospital records sent to his/her office. ? ?In some cases, they will be blood work, cultures and biopsy results pending at the time of your discharge. Please request that your primary care M.D. follows up on these results. ? ?Also Note the following: ?If you experience worsening of your admission symptoms, develop shortness of breath, life threatening emergency, suicidal or homicidal thoughts you must seek medical attention immediately by calling 911 or calling your MD immediately  if symptoms less severe. ? ?You must read complete instructions/literature along with all the possible adverse reactions/side effects for all the Medicines you take and that have been  prescribed to you. Take any new Medicines after you have completely understood and accpet all the possible adverse reactions/side effects.  ? ?Do not drive when taking Pain medications or sleeping medications (Benzodaizepines) ? ?Do not take more than prescribed Pain, Sleep and Anxiety Medications. It is not advisable to combine anxiety,sleep and pain medications without talking with your primary care practitioner ? ?Special Instructions: If you have smoked or chewed Tobacco  in the last 2 yrs please stop smoking, stop any regular Alcohol  and or any Recreational drug use. ? ?Wear Seat belts while driving. ? ?Please note: ?You were cared for by a hospitalist during your hospital stay. Once you are discharged, your primary care physician will handle any further medical issues. Please note that NO REFILLS for any discharge medications will be authorized once you are discharged, as it is imperative that you return to your primary care physician (or establish a relationship with a primary care physician if you do not have one) for your post hospital discharge needs so that they can reassess your need for medications and monitor your lab values.  ? Seizure precautions: ?Per [facility] statutes, patients with seizures are not allowed to drive until they have been seizure-free for six months and cleared by a physician  ?  ?Use caution when using heavy equipment or power tools. Avoid working on ladders or at heights. Take showers instead of baths. Ensure  the water temperature is not too high on the home water heater. Do not go swimming alone. Do not lock yourself in a room alone (i.e. bathroom). When caring for infants or small children, sit down when holding, feeding, or changing them to minimize risk of injury to the child in the event you have a seizure. Maintain good sleep hygiene. Avoid alcohol.  ?  ?If patient has another seizure, call 911 and bring them back to the ED if: ?A.  The seizure lasts longer than 5  minutes.      ?B.  The patient doesn't wake shortly after the seizure or has new problems such as difficulty seeing, speaking or moving following the seizure ?C.  The patient was injured during the seizure ?D.  Lowella Dandy

## 2021-07-27 NOTE — TOC CAGE-AID Note (Signed)
Transition of Care (TOC) - CAGE-AID Screening ? ? ?Patient Details  ?Name: Felicia Cross ?MRN: [id number] ?Date of Birth: 03-28-1933 ? ?Transition of Care (TOC) CM/SW Contact:    ?Jagjit Riner C Tarpley-Carter, LCSWA ?Phone Number: ?07/27/2021, 2:57 PM ? ? ?Clinical Narrative: ?Pt participated in Cage-Aid.  Pt stated she does not use substance or ETOH.  Pt was not offered resources, due to no usage of substance or ETOH.    ? ?Insurance underwriter, MSW, LCSW-A ?Pronouns:  She/Her/Hers ?[facility] ?Clinical Social Worker ?Direct Number:  [phone] ?[email or web address]  ? ? ?CAGE-AID Screening: ?  ? ?Have You Ever Felt You Ought to Cut Down on Your Drinking or Drug Use?: No ?Have People Annoyed You By Critizing Your Drinking Or Drug Use?: No ?Have You Felt Bad Or Guilty About Your Drinking Or Drug Use?: No ?Have You Ever Had a Drink or Used Drugs First Thing In The Morning to Steady Your Nerves or to Get Rid of a Hangover?: No ?CAGE-AID Score: 0 ? ?Substance Abuse Education Offered: No ? ?  ? ? ? ? ? ? ?

## 2021-07-27 NOTE — Care Management Important Message (Signed)
Important Message ? ?Patient Details  ?Name: Felicia Cross ?MRN: [id number] ?Date of Birth: 05/08/1933 ? ? ?Medicare Important Message Given:  Yes ? ? ? ? ?Fatima Fedie ?07/27/2021, 1:41 PM ?

## 2021-07-27 NOTE — TOC Transition Note (Signed)
Transition of Care (TOC) - CM/SW Discharge Note ? ? ?Patient Details  ?Name: Felicia Cross ?MRN: [id number] ?Date of Birth: 29-Jul-1932 ? ?Transition of Care (TOC) CM/SW Contact:  ?Mearl Latin, LCSW ?Phone Number: ?07/27/2021, 12:54 PM ? ? ?Clinical Narrative:    ?Patient will DC to: [facility] ?Anticipated DC date: 07/27/21 ?Family notified: Daughters [name] and Elon Jester ?Transport by: Sharin Mons ? ? ?Per MD patient ready for DC to [facility]. RN to call report prior to discharge [phone] Room 1204p). RN, patient, patient's family, and facility notified of DC. Discharge Summary and FL2 sent to facility. DC packet on chart. Ambulance transport requested for patient.  ? ?CSW will sign off for now as social work intervention is no longer needed. Please consult [place] again if new needs arise. ? ? ? ? ?Final next level of care: Skilled Nursing Facility ?Barriers to Discharge: Barriers Resolved ? ? ?Patient Goals and CMS Choice ?Patient states their goals for this hospitalization and ongoing recovery are:: Rehab ?CMS Medicare.gov Compare Post Acute Care list provided to:: Patient ?Choice offered to / list presented to : Patient, Adult Children ? ?Discharge Placement ?  ?Existing PASRR number confirmed : 07/27/21          ?Patient chooses bed at: [facility] ?Patient to be transferred to facility by: [facility] ?Name of family member notified: Daughters ?Patient and family notified of of transfer: 07/27/21 ? ?Discharge Plan and Services ?In-house Referral: Clinical Social Work ?  ?Post Acute Care Choice: Skilled Nursing Facility          ?  ?  ?  ?  ?  ?  ?  ?  ?  ?  ? ?Social Determinants of Health (SDOH) Interventions ?  ? ? ?Readmission Risk Interventions ?No flowsheet data found. ? ? ? ? ?

## 2021-07-29 DIAGNOSIS — E871 Hypo-osmolality and hyponatremia: Secondary | ICD-10-CM | POA: Diagnosis not present

## 2021-07-29 DIAGNOSIS — I10 Essential (primary) hypertension: Secondary | ICD-10-CM | POA: Diagnosis not present

## 2021-07-29 DIAGNOSIS — M19039 Primary osteoarthritis, unspecified wrist: Secondary | ICD-10-CM | POA: Diagnosis not present

## 2021-07-29 DIAGNOSIS — S134XXD Sprain of ligaments of cervical spine, subsequent encounter: Secondary | ICD-10-CM | POA: Diagnosis not present

## 2021-07-29 DIAGNOSIS — R2689 Other abnormalities of gait and mobility: Secondary | ICD-10-CM | POA: Diagnosis not present

## 2021-07-29 DIAGNOSIS — I951 Orthostatic hypotension: Secondary | ICD-10-CM | POA: Diagnosis not present

## 2021-07-29 DIAGNOSIS — E538 Deficiency of other specified B group vitamins: Secondary | ICD-10-CM | POA: Diagnosis not present

## 2021-07-29 DIAGNOSIS — E785 Hyperlipidemia, unspecified: Secondary | ICD-10-CM | POA: Diagnosis not present

## 2021-07-29 DIAGNOSIS — Z8673 Personal history of transient ischemic attack (TIA), and cerebral infarction without residual deficits: Secondary | ICD-10-CM | POA: Diagnosis not present

## 2021-07-29 DIAGNOSIS — R569 Unspecified convulsions: Secondary | ICD-10-CM | POA: Diagnosis not present

## 2021-07-29 DIAGNOSIS — I5189 Other ill-defined heart diseases: Secondary | ICD-10-CM | POA: Diagnosis not present

## 2021-07-29 DIAGNOSIS — S0003XD Contusion of scalp, subsequent encounter: Secondary | ICD-10-CM | POA: Diagnosis not present

## 2021-08-01 ENCOUNTER — Emergency Department (HOSPITAL_COMMUNITY): Payer: Medicare Other

## 2021-08-01 ENCOUNTER — Emergency Department (HOSPITAL_COMMUNITY)
Admission: EM | Admit: 2021-08-01 | Discharge: 2021-08-01 | Disposition: A | Discharge status: Home / Self Care | Payer: Medicare Other | Attending: Emergency Medicine | Admitting: Emergency Medicine

## 2021-08-01 ENCOUNTER — Encounter (HOSPITAL_COMMUNITY): Payer: Self-pay | Admitting: Emergency Medicine

## 2021-08-01 DIAGNOSIS — W07XXXA Fall from chair, initial encounter: Secondary | ICD-10-CM | POA: Diagnosis not present

## 2021-08-01 DIAGNOSIS — S0181XA Laceration without foreign body of other part of head, initial encounter: Secondary | ICD-10-CM | POA: Diagnosis not present

## 2021-08-01 DIAGNOSIS — I2510 Atherosclerotic heart disease of native coronary artery without angina pectoris: Secondary | ICD-10-CM | POA: Diagnosis not present

## 2021-08-01 DIAGNOSIS — I6529 Occlusion and stenosis of unspecified carotid artery: Secondary | ICD-10-CM | POA: Diagnosis not present

## 2021-08-01 DIAGNOSIS — S0993XA Unspecified injury of face, initial encounter: Secondary | ICD-10-CM | POA: Diagnosis not present

## 2021-08-01 DIAGNOSIS — Z79899 Other long term (current) drug therapy: Secondary | ICD-10-CM | POA: Insufficient documentation

## 2021-08-01 DIAGNOSIS — W19XXXA Unspecified fall, initial encounter: Secondary | ICD-10-CM

## 2021-08-01 DIAGNOSIS — S0083XA Contusion of other part of head, initial encounter: Secondary | ICD-10-CM | POA: Diagnosis not present

## 2021-08-01 DIAGNOSIS — I10 Essential (primary) hypertension: Secondary | ICD-10-CM | POA: Diagnosis not present

## 2021-08-01 DIAGNOSIS — M25512 Pain in left shoulder: Secondary | ICD-10-CM | POA: Insufficient documentation

## 2021-08-01 DIAGNOSIS — S0003XA Contusion of scalp, initial encounter: Secondary | ICD-10-CM | POA: Diagnosis not present

## 2021-08-01 DIAGNOSIS — Z7982 Long term (current) use of aspirin: Secondary | ICD-10-CM | POA: Diagnosis not present

## 2021-08-01 DIAGNOSIS — S0990XA Unspecified injury of head, initial encounter: Secondary | ICD-10-CM

## 2021-08-01 DIAGNOSIS — R58 Hemorrhage, not elsewhere classified: Secondary | ICD-10-CM | POA: Diagnosis not present

## 2021-08-01 MED ORDER — ACETAMINOPHEN 325 MG PO TABS
650.0000 mg | ORAL_TABLET | Freq: Once | ORAL | Status: AC
Start: 2021-08-01 — End: 2021-08-01
  Administered 2021-08-01: 650 mg via ORAL
  Filled 2021-08-01: qty 2

## 2021-08-01 NOTE — Discharge Instructions (Signed)
You can use tylenol and voltaren gel for your shoulder pain.  ? ?Please follow up with your primary care provider within 5-7 days for re-evaluation of your symptoms. If you do not have a primary care provider, information for a healthcare clinic has been provided for you to make arrangements for follow up care. Please return to the emergency department for any new or worsening symptoms. ? ?

## 2021-08-01 NOTE — ED Notes (Signed)
Transport called.

## 2021-08-01 NOTE — ED Notes (Signed)
Patient transported to CT 
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
## 2021-08-01 NOTE — ED Notes (Signed)
[facility] called x5 for report back ?
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
## 2021-08-02 DIAGNOSIS — S0003XA Contusion of scalp, initial encounter: Secondary | ICD-10-CM | POA: Diagnosis not present

## 2021-08-02 DIAGNOSIS — S134XXA Sprain of ligaments of cervical spine, initial encounter: Secondary | ICD-10-CM | POA: Diagnosis not present

## 2021-08-02 DIAGNOSIS — I951 Orthostatic hypotension: Secondary | ICD-10-CM | POA: Diagnosis not present

## 2021-08-03 DIAGNOSIS — E871 Hypo-osmolality and hyponatremia: Secondary | ICD-10-CM | POA: Diagnosis not present

## 2021-08-03 DIAGNOSIS — S134XXD Sprain of ligaments of cervical spine, subsequent encounter: Secondary | ICD-10-CM | POA: Diagnosis not present

## 2021-08-03 DIAGNOSIS — M19039 Primary osteoarthritis, unspecified wrist: Secondary | ICD-10-CM | POA: Diagnosis not present

## 2021-08-03 DIAGNOSIS — I951 Orthostatic hypotension: Secondary | ICD-10-CM | POA: Diagnosis not present

## 2021-08-03 DIAGNOSIS — I10 Essential (primary) hypertension: Secondary | ICD-10-CM | POA: Diagnosis not present

## 2021-08-03 DIAGNOSIS — R569 Unspecified convulsions: Secondary | ICD-10-CM | POA: Diagnosis not present

## 2021-08-03 DIAGNOSIS — S0003XD Contusion of scalp, subsequent encounter: Secondary | ICD-10-CM | POA: Diagnosis not present

## 2021-08-03 DIAGNOSIS — Z8679 Personal history of other diseases of the circulatory system: Secondary | ICD-10-CM | POA: Diagnosis not present

## 2021-08-05 DIAGNOSIS — R2681 Unsteadiness on feet: Secondary | ICD-10-CM | POA: Diagnosis not present

## 2021-08-05 DIAGNOSIS — I951 Orthostatic hypotension: Secondary | ICD-10-CM | POA: Diagnosis not present

## 2021-08-05 DIAGNOSIS — Z9181 History of falling: Secondary | ICD-10-CM | POA: Diagnosis not present

## 2021-08-05 DIAGNOSIS — S06360D Traumatic hemorrhage of cerebrum, unspecified, without loss of consciousness, subsequent encounter: Secondary | ICD-10-CM | POA: Diagnosis not present

## 2021-08-05 DIAGNOSIS — M6281 Muscle weakness (generalized): Secondary | ICD-10-CM | POA: Diagnosis not present

## 2021-08-05 DIAGNOSIS — R569 Unspecified convulsions: Secondary | ICD-10-CM | POA: Diagnosis not present

## 2021-08-12 ENCOUNTER — Other Ambulatory Visit: Payer: Self-pay | Admitting: *Deleted

## 2021-08-12 NOTE — Patient Outreach (Signed)
Per [place] eligible member currently resides in [facility].  Screening for potential [facility] care coordination services as a benefit of [employer] plan. ? ?Felicia Cross admitted to SNF on 07/27/21 after hospitalization. ? ?Member's PCP at [facility] [place] has [place] care coordination team available if needed. CCM services would need to be ordered by PCP. ? ?Facility site visit to [facility] skilled nursing facility. Met with Felicia Cross, SNF SW concerning transition plan and potential THN needs. Anticipated transition plan is to return home with caregiver assistance. Lives with spouse. Had caregivers from 8 am to 3 pm prior. SNF SW reports family is looking into increasing caregiver hours. Felicia Cross, SW reports Felicia Cross daughters Felicia Cross and Felicia Cross are very active and supportive. States Felicia Cross should be contacted to discuss THN follow up. ? ?Went to Felicia Cross room at [facility] to discuss [facility] services. Family not present. Felicia Cross was working with therapy. Will try back another time. ? ?Left [facility] Care Management brochure, 24-hr nurse advice line magnet, and writer's contact information on bedside dresser.  ? ?Will continue to collaborate with SNF SW and follow transition plans/needs while member resides in SNF.  ? ?Will plan outreach to family to discuss [facility] care coordination/care management services.  ? ? ?Felicia Rolling, MSN, RN,BSN ?[place] Coordinator ?[phone] [facility]) ?[phone]  (Toll free office)  ? ? ? ? ?  ?

## 2021-08-16 DIAGNOSIS — I951 Orthostatic hypotension: Secondary | ICD-10-CM | POA: Diagnosis not present

## 2021-08-16 DIAGNOSIS — M6281 Muscle weakness (generalized): Secondary | ICD-10-CM | POA: Diagnosis not present

## 2021-08-16 DIAGNOSIS — Z9181 History of falling: Secondary | ICD-10-CM | POA: Diagnosis not present

## 2021-08-16 DIAGNOSIS — S06360D Traumatic hemorrhage of cerebrum, unspecified, without loss of consciousness, subsequent encounter: Secondary | ICD-10-CM | POA: Diagnosis not present

## 2021-08-16 DIAGNOSIS — R2681 Unsteadiness on feet: Secondary | ICD-10-CM | POA: Diagnosis not present

## 2021-08-16 DIAGNOSIS — R569 Unspecified convulsions: Secondary | ICD-10-CM | POA: Diagnosis not present

## 2021-08-18 ENCOUNTER — Ambulatory Visit: Payer: Medicare Other | Admitting: Family Medicine

## 2021-08-18 DIAGNOSIS — R2681 Unsteadiness on feet: Secondary | ICD-10-CM | POA: Diagnosis not present

## 2021-08-18 DIAGNOSIS — S06360D Traumatic hemorrhage of cerebrum, unspecified, without loss of consciousness, subsequent encounter: Secondary | ICD-10-CM | POA: Diagnosis not present

## 2021-08-18 DIAGNOSIS — M6281 Muscle weakness (generalized): Secondary | ICD-10-CM | POA: Diagnosis not present

## 2021-08-18 DIAGNOSIS — R569 Unspecified convulsions: Secondary | ICD-10-CM | POA: Diagnosis not present

## 2021-08-18 DIAGNOSIS — I951 Orthostatic hypotension: Secondary | ICD-10-CM | POA: Diagnosis not present

## 2021-08-18 DIAGNOSIS — Z9181 History of falling: Secondary | ICD-10-CM | POA: Diagnosis not present

## 2021-08-19 DIAGNOSIS — R2681 Unsteadiness on feet: Secondary | ICD-10-CM | POA: Diagnosis not present

## 2021-08-19 DIAGNOSIS — S06360D Traumatic hemorrhage of cerebrum, unspecified, without loss of consciousness, subsequent encounter: Secondary | ICD-10-CM | POA: Diagnosis not present

## 2021-08-19 DIAGNOSIS — Z9181 History of falling: Secondary | ICD-10-CM | POA: Diagnosis not present

## 2021-08-19 DIAGNOSIS — R569 Unspecified convulsions: Secondary | ICD-10-CM | POA: Diagnosis not present

## 2021-08-19 DIAGNOSIS — M6281 Muscle weakness (generalized): Secondary | ICD-10-CM | POA: Diagnosis not present

## 2021-08-19 DIAGNOSIS — I951 Orthostatic hypotension: Secondary | ICD-10-CM | POA: Diagnosis not present

## 2021-08-20 DIAGNOSIS — S134XXD Sprain of ligaments of cervical spine, subsequent encounter: Secondary | ICD-10-CM | POA: Diagnosis not present

## 2021-08-20 DIAGNOSIS — R2689 Other abnormalities of gait and mobility: Secondary | ICD-10-CM | POA: Diagnosis not present

## 2021-08-20 DIAGNOSIS — Z8679 Personal history of other diseases of the circulatory system: Secondary | ICD-10-CM | POA: Diagnosis not present

## 2021-08-20 DIAGNOSIS — R748 Abnormal levels of other serum enzymes: Secondary | ICD-10-CM | POA: Diagnosis not present

## 2021-08-23 DIAGNOSIS — R2681 Unsteadiness on feet: Secondary | ICD-10-CM | POA: Diagnosis not present

## 2021-08-23 DIAGNOSIS — S06360D Traumatic hemorrhage of cerebrum, unspecified, without loss of consciousness, subsequent encounter: Secondary | ICD-10-CM | POA: Diagnosis not present

## 2021-08-23 DIAGNOSIS — Z9181 History of falling: Secondary | ICD-10-CM | POA: Diagnosis not present

## 2021-08-23 DIAGNOSIS — I951 Orthostatic hypotension: Secondary | ICD-10-CM | POA: Diagnosis not present

## 2021-08-23 DIAGNOSIS — M6281 Muscle weakness (generalized): Secondary | ICD-10-CM | POA: Diagnosis not present

## 2021-08-23 DIAGNOSIS — R569 Unspecified convulsions: Secondary | ICD-10-CM | POA: Diagnosis not present

## 2021-08-25 DIAGNOSIS — Z9181 History of falling: Secondary | ICD-10-CM | POA: Diagnosis not present

## 2021-08-25 DIAGNOSIS — I951 Orthostatic hypotension: Secondary | ICD-10-CM | POA: Diagnosis not present

## 2021-08-25 DIAGNOSIS — M6281 Muscle weakness (generalized): Secondary | ICD-10-CM | POA: Diagnosis not present

## 2021-08-25 DIAGNOSIS — R569 Unspecified convulsions: Secondary | ICD-10-CM | POA: Diagnosis not present

## 2021-08-25 DIAGNOSIS — S06360D Traumatic hemorrhage of cerebrum, unspecified, without loss of consciousness, subsequent encounter: Secondary | ICD-10-CM | POA: Diagnosis not present

## 2021-08-25 DIAGNOSIS — R2681 Unsteadiness on feet: Secondary | ICD-10-CM | POA: Diagnosis not present

## 2021-08-26 ENCOUNTER — Other Ambulatory Visit: Payer: Self-pay | Admitting: *Deleted

## 2021-08-26 DIAGNOSIS — I951 Orthostatic hypotension: Secondary | ICD-10-CM | POA: Diagnosis not present

## 2021-08-26 DIAGNOSIS — R569 Unspecified convulsions: Secondary | ICD-10-CM | POA: Diagnosis not present

## 2021-08-26 DIAGNOSIS — M6281 Muscle weakness (generalized): Secondary | ICD-10-CM | POA: Diagnosis not present

## 2021-08-26 DIAGNOSIS — S06360D Traumatic hemorrhage of cerebrum, unspecified, without loss of consciousness, subsequent encounter: Secondary | ICD-10-CM | POA: Diagnosis not present

## 2021-08-26 DIAGNOSIS — Z9181 History of falling: Secondary | ICD-10-CM | POA: Diagnosis not present

## 2021-08-26 DIAGNOSIS — R2681 Unsteadiness on feet: Secondary | ICD-10-CM | POA: Diagnosis not present

## 2021-08-26 NOTE — Patient Outreach (Signed)
THN Post- Acute Care Coordinator follow up. Per [place] eligible member currently resides in [facility].  Screened for potential [facility] care coordination services as a benefit of [employer] plan. ? ?Member's PCP at [facility]F has [facility] [employer] care coordination team. CCM services ordered by PCP as appropriate ? ?Facility site visit to [facility] skilled nursing facility. Met with Kirstin, SNF SW concerning transition plan, and potential THN needs. Kirstin reports member's family won 2nd level appeal discharge.  States Felicia Cross transition plan is for home. Has caregivers several hours a day. Daughter Felicia Cross and Felicia Cross are extremely supportive. ? ?Spoke with [name] in room at [facility] to make aware PCP office has [facility] embedded care management/care coordination team if needed. [name] says either daughter Felicia Cross or Felicia Cross can be contacted to discuss [facility] services further.  ? ?Provided [facility] Care Management brochure, 24-hr nurse advice line magnet, and writer's contact information for family's review.  ? ?Will continue to follow. Will plan outreach to daughter (s) at later time.  ? ? ?Felicia Rolling, MSN, RN,BSN ?[place] Coordinator ?[phone] [facility]) ?[phone]  (Toll free office)  ? ? ?  ?

## 2021-08-30 DIAGNOSIS — S06360D Traumatic hemorrhage of cerebrum, unspecified, without loss of consciousness, subsequent encounter: Secondary | ICD-10-CM | POA: Diagnosis not present

## 2021-08-30 DIAGNOSIS — M6281 Muscle weakness (generalized): Secondary | ICD-10-CM | POA: Diagnosis not present

## 2021-08-30 DIAGNOSIS — R569 Unspecified convulsions: Secondary | ICD-10-CM | POA: Diagnosis not present

## 2021-08-30 DIAGNOSIS — R2681 Unsteadiness on feet: Secondary | ICD-10-CM | POA: Diagnosis not present

## 2021-08-30 DIAGNOSIS — Z9181 History of falling: Secondary | ICD-10-CM | POA: Diagnosis not present

## 2021-08-30 DIAGNOSIS — I951 Orthostatic hypotension: Secondary | ICD-10-CM | POA: Diagnosis not present

## 2021-08-31 DIAGNOSIS — I10 Essential (primary) hypertension: Secondary | ICD-10-CM | POA: Diagnosis not present

## 2021-08-31 DIAGNOSIS — S134XXD Sprain of ligaments of cervical spine, subsequent encounter: Secondary | ICD-10-CM | POA: Diagnosis not present

## 2021-08-31 DIAGNOSIS — Z8679 Personal history of other diseases of the circulatory system: Secondary | ICD-10-CM | POA: Diagnosis not present

## 2021-08-31 DIAGNOSIS — E871 Hypo-osmolality and hyponatremia: Secondary | ICD-10-CM | POA: Diagnosis not present

## 2021-08-31 DIAGNOSIS — R319 Hematuria, unspecified: Secondary | ICD-10-CM | POA: Diagnosis not present

## 2021-08-31 DIAGNOSIS — R569 Unspecified convulsions: Secondary | ICD-10-CM | POA: Diagnosis not present

## 2021-09-01 DIAGNOSIS — N390 Urinary tract infection, site not specified: Secondary | ICD-10-CM | POA: Diagnosis not present

## 2021-09-01 DIAGNOSIS — Z9181 History of falling: Secondary | ICD-10-CM | POA: Diagnosis not present

## 2021-09-01 DIAGNOSIS — I951 Orthostatic hypotension: Secondary | ICD-10-CM | POA: Diagnosis not present

## 2021-09-01 DIAGNOSIS — M6281 Muscle weakness (generalized): Secondary | ICD-10-CM | POA: Diagnosis not present

## 2021-09-01 DIAGNOSIS — S06360D Traumatic hemorrhage of cerebrum, unspecified, without loss of consciousness, subsequent encounter: Secondary | ICD-10-CM | POA: Diagnosis not present

## 2021-09-01 DIAGNOSIS — R2681 Unsteadiness on feet: Secondary | ICD-10-CM | POA: Diagnosis not present

## 2021-09-01 DIAGNOSIS — R569 Unspecified convulsions: Secondary | ICD-10-CM | POA: Diagnosis not present

## 2021-09-02 DIAGNOSIS — Z9181 History of falling: Secondary | ICD-10-CM | POA: Diagnosis not present

## 2021-09-02 DIAGNOSIS — I951 Orthostatic hypotension: Secondary | ICD-10-CM | POA: Diagnosis not present

## 2021-09-02 DIAGNOSIS — S06360D Traumatic hemorrhage of cerebrum, unspecified, without loss of consciousness, subsequent encounter: Secondary | ICD-10-CM | POA: Diagnosis not present

## 2021-09-02 DIAGNOSIS — R2681 Unsteadiness on feet: Secondary | ICD-10-CM | POA: Diagnosis not present

## 2021-09-02 DIAGNOSIS — N390 Urinary tract infection, site not specified: Secondary | ICD-10-CM | POA: Diagnosis not present

## 2021-09-02 DIAGNOSIS — M6281 Muscle weakness (generalized): Secondary | ICD-10-CM | POA: Diagnosis not present

## 2021-09-02 DIAGNOSIS — R569 Unspecified convulsions: Secondary | ICD-10-CM | POA: Diagnosis not present

## 2021-09-03 DIAGNOSIS — S06360D Traumatic hemorrhage of cerebrum, unspecified, without loss of consciousness, subsequent encounter: Secondary | ICD-10-CM | POA: Diagnosis not present

## 2021-09-03 DIAGNOSIS — M6281 Muscle weakness (generalized): Secondary | ICD-10-CM | POA: Diagnosis not present

## 2021-09-03 DIAGNOSIS — N390 Urinary tract infection, site not specified: Secondary | ICD-10-CM | POA: Diagnosis not present

## 2021-09-03 DIAGNOSIS — R2689 Other abnormalities of gait and mobility: Secondary | ICD-10-CM | POA: Diagnosis not present

## 2021-09-06 DIAGNOSIS — R2689 Other abnormalities of gait and mobility: Secondary | ICD-10-CM | POA: Diagnosis not present

## 2021-09-06 DIAGNOSIS — M6281 Muscle weakness (generalized): Secondary | ICD-10-CM | POA: Diagnosis not present

## 2021-09-06 DIAGNOSIS — S06360D Traumatic hemorrhage of cerebrum, unspecified, without loss of consciousness, subsequent encounter: Secondary | ICD-10-CM | POA: Diagnosis not present

## 2021-09-07 DIAGNOSIS — R2689 Other abnormalities of gait and mobility: Secondary | ICD-10-CM | POA: Diagnosis not present

## 2021-09-07 DIAGNOSIS — R2681 Unsteadiness on feet: Secondary | ICD-10-CM | POA: Diagnosis not present

## 2021-09-07 DIAGNOSIS — S06360D Traumatic hemorrhage of cerebrum, unspecified, without loss of consciousness, subsequent encounter: Secondary | ICD-10-CM | POA: Diagnosis not present

## 2021-09-07 DIAGNOSIS — R569 Unspecified convulsions: Secondary | ICD-10-CM | POA: Diagnosis not present

## 2021-09-07 DIAGNOSIS — M6281 Muscle weakness (generalized): Secondary | ICD-10-CM | POA: Diagnosis not present

## 2021-09-07 DIAGNOSIS — Z9181 History of falling: Secondary | ICD-10-CM | POA: Diagnosis not present

## 2021-09-07 DIAGNOSIS — N390 Urinary tract infection, site not specified: Secondary | ICD-10-CM | POA: Diagnosis not present

## 2021-09-07 DIAGNOSIS — I951 Orthostatic hypotension: Secondary | ICD-10-CM | POA: Diagnosis not present

## 2021-09-08 DIAGNOSIS — I951 Orthostatic hypotension: Secondary | ICD-10-CM | POA: Diagnosis not present

## 2021-09-08 DIAGNOSIS — R2681 Unsteadiness on feet: Secondary | ICD-10-CM | POA: Diagnosis not present

## 2021-09-08 DIAGNOSIS — S06360D Traumatic hemorrhage of cerebrum, unspecified, without loss of consciousness, subsequent encounter: Secondary | ICD-10-CM | POA: Diagnosis not present

## 2021-09-08 DIAGNOSIS — R2689 Other abnormalities of gait and mobility: Secondary | ICD-10-CM | POA: Diagnosis not present

## 2021-09-08 DIAGNOSIS — Z9181 History of falling: Secondary | ICD-10-CM | POA: Diagnosis not present

## 2021-09-08 DIAGNOSIS — R569 Unspecified convulsions: Secondary | ICD-10-CM | POA: Diagnosis not present

## 2021-09-08 DIAGNOSIS — N390 Urinary tract infection, site not specified: Secondary | ICD-10-CM | POA: Diagnosis not present

## 2021-09-08 DIAGNOSIS — M6281 Muscle weakness (generalized): Secondary | ICD-10-CM | POA: Diagnosis not present

## 2021-09-09 ENCOUNTER — Other Ambulatory Visit: Payer: Self-pay | Admitting: *Deleted

## 2021-09-09 DIAGNOSIS — S06360D Traumatic hemorrhage of cerebrum, unspecified, without loss of consciousness, subsequent encounter: Secondary | ICD-10-CM | POA: Diagnosis not present

## 2021-09-09 DIAGNOSIS — M6281 Muscle weakness (generalized): Secondary | ICD-10-CM | POA: Diagnosis not present

## 2021-09-09 DIAGNOSIS — R569 Unspecified convulsions: Secondary | ICD-10-CM | POA: Diagnosis not present

## 2021-09-09 DIAGNOSIS — R2689 Other abnormalities of gait and mobility: Secondary | ICD-10-CM | POA: Diagnosis not present

## 2021-09-09 DIAGNOSIS — R2681 Unsteadiness on feet: Secondary | ICD-10-CM | POA: Diagnosis not present

## 2021-09-09 DIAGNOSIS — I951 Orthostatic hypotension: Secondary | ICD-10-CM | POA: Diagnosis not present

## 2021-09-09 DIAGNOSIS — Z9181 History of falling: Secondary | ICD-10-CM | POA: Diagnosis not present

## 2021-09-09 DIAGNOSIS — N390 Urinary tract infection, site not specified: Secondary | ICD-10-CM | POA: Diagnosis not present

## 2021-09-09 NOTE — Patient Outreach (Signed)
THN Post- Acute Care Coordinator follow up. Per [place] eligible member currently resides in [facility] SNF.  Screening for potential [facility] care coordination services as a benefit of [employer] plan. ? ?Member's PCP at [facility] has [place] care coordination team available if needed.  ? ?Facility site visit to [facility] facility. Met with Kirstin, SNF SW concerning member's transition plan and potential THN needs. Anticipated transition plan is to return home with spouse. Family has filed 3rd level appeal. Discharge date not set yet. Advised to contact either daughter Sharyn Lull or [employer].  ? ?Telephone call made to daughter Marga Hoots [phone]. Patient identifiers confirmed. Mytzy confirms transition plan is to return home. States member will have continued paid caregiver assistance thru [employer]. States caregiver hours will be increased when Mrs. Lac returns home. Mytzy states member was not wheelchair bound prior to hospitalization and their goal is for her not to be wheelchair bound she returns home. States Mrs. Laprise is making progress. States therapy is working on step training. Mrs. Benedict has 1 step to negotiate at home.  ? ?Explained [facility] services and sent [place] [facility] information via email to [facility]. Confirmed receipt. Discussed writer will follow up next week on whether family is interested [facility] follow up.  ? ?Mytzy states she lives in [place] and [place] lives locally. States either she or Sharyn Lull can be contacted. States Sharyn Lull is starting a new job this week. Therefore, Mytzy states she is a little easier to reach. ? ?Will continue to follow and plan outreach again next week.  ? ? ?Marthenia Rolling, MSN, RN,BSN ?[place] Coordinator ?[phone] [facility]) ?[phone]  (Toll free office)  ? ? ?

## 2021-09-10 DIAGNOSIS — R2689 Other abnormalities of gait and mobility: Secondary | ICD-10-CM | POA: Diagnosis not present

## 2021-09-10 DIAGNOSIS — M6281 Muscle weakness (generalized): Secondary | ICD-10-CM | POA: Diagnosis not present

## 2021-09-10 DIAGNOSIS — S06360D Traumatic hemorrhage of cerebrum, unspecified, without loss of consciousness, subsequent encounter: Secondary | ICD-10-CM | POA: Diagnosis not present

## 2021-09-11 DIAGNOSIS — R2689 Other abnormalities of gait and mobility: Secondary | ICD-10-CM | POA: Diagnosis not present

## 2021-09-11 DIAGNOSIS — M6281 Muscle weakness (generalized): Secondary | ICD-10-CM | POA: Diagnosis not present

## 2021-09-11 DIAGNOSIS — S06360D Traumatic hemorrhage of cerebrum, unspecified, without loss of consciousness, subsequent encounter: Secondary | ICD-10-CM | POA: Diagnosis not present

## 2021-09-13 ENCOUNTER — Telehealth: Payer: Self-pay

## 2021-09-13 DIAGNOSIS — R2689 Other abnormalities of gait and mobility: Secondary | ICD-10-CM | POA: Diagnosis not present

## 2021-09-13 DIAGNOSIS — N390 Urinary tract infection, site not specified: Secondary | ICD-10-CM | POA: Diagnosis not present

## 2021-09-13 DIAGNOSIS — Z9181 History of falling: Secondary | ICD-10-CM | POA: Diagnosis not present

## 2021-09-13 DIAGNOSIS — R569 Unspecified convulsions: Secondary | ICD-10-CM | POA: Diagnosis not present

## 2021-09-13 DIAGNOSIS — S06360D Traumatic hemorrhage of cerebrum, unspecified, without loss of consciousness, subsequent encounter: Secondary | ICD-10-CM | POA: Diagnosis not present

## 2021-09-13 DIAGNOSIS — R2681 Unsteadiness on feet: Secondary | ICD-10-CM | POA: Diagnosis not present

## 2021-09-13 DIAGNOSIS — I951 Orthostatic hypotension: Secondary | ICD-10-CM | POA: Diagnosis not present

## 2021-09-13 DIAGNOSIS — M6281 Muscle weakness (generalized): Secondary | ICD-10-CM | POA: Diagnosis not present

## 2021-09-13 NOTE — Telephone Encounter (Signed)
Pts. Daughter Mitzy called into the office stating that patient is at [facility] for rehab and needs an order for a Hemi one arm walker. She stated they are trying to get all things in place for the mothers discharge from [place]. The daughter would like a call back at [phone] ?

## 2021-09-13 NOTE — Telephone Encounter (Signed)
Last OV 07/05/21 

## 2021-09-13 NOTE — Telephone Encounter (Signed)
Ok

## 2021-09-14 ENCOUNTER — Ambulatory Visit: Payer: Medicare Other | Admitting: Podiatry

## 2021-09-15 DIAGNOSIS — I5189 Other ill-defined heart diseases: Secondary | ICD-10-CM | POA: Diagnosis not present

## 2021-09-15 DIAGNOSIS — Z9181 History of falling: Secondary | ICD-10-CM | POA: Diagnosis not present

## 2021-09-15 DIAGNOSIS — I10 Essential (primary) hypertension: Secondary | ICD-10-CM | POA: Diagnosis not present

## 2021-09-15 DIAGNOSIS — R569 Unspecified convulsions: Secondary | ICD-10-CM | POA: Diagnosis not present

## 2021-09-15 DIAGNOSIS — I951 Orthostatic hypotension: Secondary | ICD-10-CM | POA: Diagnosis not present

## 2021-09-15 DIAGNOSIS — R2681 Unsteadiness on feet: Secondary | ICD-10-CM | POA: Diagnosis not present

## 2021-09-15 DIAGNOSIS — S06360D Traumatic hemorrhage of cerebrum, unspecified, without loss of consciousness, subsequent encounter: Secondary | ICD-10-CM | POA: Diagnosis not present

## 2021-09-15 DIAGNOSIS — E538 Deficiency of other specified B group vitamins: Secondary | ICD-10-CM | POA: Diagnosis not present

## 2021-09-15 DIAGNOSIS — S134XXD Sprain of ligaments of cervical spine, subsequent encounter: Secondary | ICD-10-CM | POA: Diagnosis not present

## 2021-09-15 DIAGNOSIS — N390 Urinary tract infection, site not specified: Secondary | ICD-10-CM | POA: Diagnosis not present

## 2021-09-15 DIAGNOSIS — M6281 Muscle weakness (generalized): Secondary | ICD-10-CM | POA: Diagnosis not present

## 2021-09-16 ENCOUNTER — Other Ambulatory Visit: Payer: Self-pay | Admitting: *Deleted

## 2021-09-16 DIAGNOSIS — I10 Essential (primary) hypertension: Secondary | ICD-10-CM

## 2021-09-16 NOTE — Patient Outreach (Signed)
THN Post- Acute Care Coordinator follow up. Per [place] eligible member resides in [facility].  Screening for potential [facility] care coordination services as a benefit of [employer] plan. ? ?Member's PCP at [facility] [place] has [place] care coordination team. ? ?Facility site visit to [facility] facility. Met with Kirstin, SNF SW concerning transition plan/date. Kirstin reports member will transition home today 09/16/21. Has very supportive family. Will have [facility] home health and ongoing caregiver assistance with increased hours.  ? ?Telephone call made to [place] (daughter/DPR) [phone]. No answer. HIPAA compliant voicemail message requesting call back.  ? ?Also noted [name] has contacted PCP office regarding hemi walker. Could use additional assist with care coordination needs.   ? ?Mrs. Salle has medical history of CAD, HTN, HLD, seizure d/t traumatic hematoma.  ? ?Will make referral for [facility] embedded care coordination RNCM. Previously spoke with [name] (daughter/DPR) [phone] about [facility] Care Management services.  ? ? ?Marthenia Rolling, MSN, RN,BSN ?[place] Coordinator ?[phone] [facility]) ?[phone]  (Toll free office)  ? ?

## 2021-09-17 ENCOUNTER — Telehealth: Payer: Self-pay | Admitting: Family Medicine

## 2021-09-17 ENCOUNTER — Ambulatory Visit: Payer: Medicare Other | Admitting: Podiatry

## 2021-09-17 DIAGNOSIS — S134XXD Sprain of ligaments of cervical spine, subsequent encounter: Secondary | ICD-10-CM | POA: Diagnosis not present

## 2021-09-17 DIAGNOSIS — R569 Unspecified convulsions: Secondary | ICD-10-CM | POA: Diagnosis not present

## 2021-09-17 DIAGNOSIS — I119 Hypertensive heart disease without heart failure: Secondary | ICD-10-CM | POA: Diagnosis not present

## 2021-09-17 DIAGNOSIS — M4802 Spinal stenosis, cervical region: Secondary | ICD-10-CM | POA: Diagnosis not present

## 2021-09-17 DIAGNOSIS — S06360D Traumatic hemorrhage of cerebrum, unspecified, without loss of consciousness, subsequent encounter: Secondary | ICD-10-CM | POA: Diagnosis not present

## 2021-09-17 DIAGNOSIS — I951 Orthostatic hypotension: Secondary | ICD-10-CM | POA: Diagnosis not present

## 2021-09-17 NOTE — Telephone Encounter (Signed)
[facility] is calling and needs VO 1x1, 2x3, 1x3 for strengthening, safety transfers, gait balance training, home exercise and home safety ?

## 2021-09-20 ENCOUNTER — Telehealth: Payer: Self-pay | Admitting: Family Medicine

## 2021-09-20 DIAGNOSIS — R569 Unspecified convulsions: Secondary | ICD-10-CM | POA: Diagnosis not present

## 2021-09-20 DIAGNOSIS — I119 Hypertensive heart disease without heart failure: Secondary | ICD-10-CM | POA: Diagnosis not present

## 2021-09-20 DIAGNOSIS — S06360D Traumatic hemorrhage of cerebrum, unspecified, without loss of consciousness, subsequent encounter: Secondary | ICD-10-CM | POA: Diagnosis not present

## 2021-09-20 DIAGNOSIS — I951 Orthostatic hypotension: Secondary | ICD-10-CM | POA: Diagnosis not present

## 2021-09-20 DIAGNOSIS — S134XXD Sprain of ligaments of cervical spine, subsequent encounter: Secondary | ICD-10-CM | POA: Diagnosis not present

## 2021-09-20 DIAGNOSIS — M4802 Spinal stenosis, cervical region: Secondary | ICD-10-CM | POA: Diagnosis not present

## 2021-09-20 NOTE — Telephone Encounter (Signed)
[facility] OT with [facility] needs VO for OT 1x5 ?

## 2021-09-21 ENCOUNTER — Telehealth: Payer: Self-pay | Admitting: *Deleted

## 2021-09-21 NOTE — Telephone Encounter (Signed)
Spoke with Sharyl Nimrod, per Dr Salomon Fick, gave okay for VO for OT.  ?

## 2021-09-21 NOTE — Telephone Encounter (Signed)
Spoke with Mon[place]e, gave okay for VO for PT per Dr Salomon Fick. ?

## 2021-09-21 NOTE — Chronic Care Management (AMB) (Addendum)
?  Care Management  ? ?Note ? ?09/21/2021 ?Name: Felicia Cross MRN: [id number] DOB: [birth year] ? ?Felicia Cross is a 88 y.o. year old female who is a primary care patient of Deeann Saint, MD. I reached out to [name] by phone today offer care coordination services.  ? ?[name] was given information about care management services today including:  ?Care management services include personalized support from designated clinical staff supervised by her physician, including individualized plan of care and coordination with other care providers ?24/7 contact phone numbers for assistance for urgent and routine care needs. ?The patient may stop care management services at any time by phone call to the office staff. ? ?Patient agreed to services and verbal consent obtained.  ? ?Follow up plan: ?Telephone appointment with care management team member scheduled for:09/30/21 ? ?Felicia Cross  ?Care Guide, Embedded Care Coordination ?[place]  Care Management  ?Direct Dial: [phone] ? ?

## 2021-09-22 ENCOUNTER — Inpatient Hospital Stay: Payer: Medicare Other | Admitting: Neurology

## 2021-09-22 DIAGNOSIS — R569 Unspecified convulsions: Secondary | ICD-10-CM | POA: Diagnosis not present

## 2021-09-22 DIAGNOSIS — I951 Orthostatic hypotension: Secondary | ICD-10-CM | POA: Diagnosis not present

## 2021-09-22 DIAGNOSIS — S06360D Traumatic hemorrhage of cerebrum, unspecified, without loss of consciousness, subsequent encounter: Secondary | ICD-10-CM | POA: Diagnosis not present

## 2021-09-22 DIAGNOSIS — I119 Hypertensive heart disease without heart failure: Secondary | ICD-10-CM | POA: Diagnosis not present

## 2021-09-22 DIAGNOSIS — M4802 Spinal stenosis, cervical region: Secondary | ICD-10-CM | POA: Diagnosis not present

## 2021-09-22 DIAGNOSIS — S134XXD Sprain of ligaments of cervical spine, subsequent encounter: Secondary | ICD-10-CM | POA: Diagnosis not present

## 2021-09-24 DIAGNOSIS — S134XXD Sprain of ligaments of cervical spine, subsequent encounter: Secondary | ICD-10-CM | POA: Diagnosis not present

## 2021-09-24 DIAGNOSIS — S06360D Traumatic hemorrhage of cerebrum, unspecified, without loss of consciousness, subsequent encounter: Secondary | ICD-10-CM | POA: Diagnosis not present

## 2021-09-24 DIAGNOSIS — M4802 Spinal stenosis, cervical region: Secondary | ICD-10-CM | POA: Diagnosis not present

## 2021-09-24 DIAGNOSIS — R569 Unspecified convulsions: Secondary | ICD-10-CM | POA: Diagnosis not present

## 2021-09-24 DIAGNOSIS — I119 Hypertensive heart disease without heart failure: Secondary | ICD-10-CM | POA: Diagnosis not present

## 2021-09-24 DIAGNOSIS — I951 Orthostatic hypotension: Secondary | ICD-10-CM | POA: Diagnosis not present

## 2021-09-27 DIAGNOSIS — S134XXD Sprain of ligaments of cervical spine, subsequent encounter: Secondary | ICD-10-CM | POA: Diagnosis not present

## 2021-09-27 DIAGNOSIS — S06360D Traumatic hemorrhage of cerebrum, unspecified, without loss of consciousness, subsequent encounter: Secondary | ICD-10-CM | POA: Diagnosis not present

## 2021-09-27 DIAGNOSIS — R569 Unspecified convulsions: Secondary | ICD-10-CM | POA: Diagnosis not present

## 2021-09-27 DIAGNOSIS — I119 Hypertensive heart disease without heart failure: Secondary | ICD-10-CM | POA: Diagnosis not present

## 2021-09-27 DIAGNOSIS — M4802 Spinal stenosis, cervical region: Secondary | ICD-10-CM | POA: Diagnosis not present

## 2021-09-27 DIAGNOSIS — I951 Orthostatic hypotension: Secondary | ICD-10-CM | POA: Diagnosis not present

## 2021-09-29 ENCOUNTER — Telehealth: Payer: Self-pay | Admitting: Family Medicine

## 2021-09-29 ENCOUNTER — Ambulatory Visit (INDEPENDENT_AMBULATORY_CARE_PROVIDER_SITE_OTHER): Payer: Medicare Other | Admitting: Family Medicine

## 2021-09-29 VITALS — BP 164/100 | HR 64 | Temp 97.8°F

## 2021-09-29 DIAGNOSIS — R300 Dysuria: Secondary | ICD-10-CM

## 2021-09-29 DIAGNOSIS — R2689 Other abnormalities of gait and mobility: Secondary | ICD-10-CM | POA: Diagnosis not present

## 2021-09-29 DIAGNOSIS — I10 Essential (primary) hypertension: Secondary | ICD-10-CM

## 2021-09-29 LAB — POCT URINALYSIS DIPSTICK
Bilirubin, UA: NEGATIVE
Blood, UA: NEGATIVE
Glucose, UA: NEGATIVE
Ketones, UA: NEGATIVE
Leukocytes, UA: NEGATIVE
Protein, UA: NEGATIVE
Spec Grav, UA: 1.025 (ref 1.010–1.025)
Urobilinogen, UA: NEGATIVE E.U./dL — AB
pH, UA: 6 (ref 5.0–8.0)

## 2021-09-29 MED ORDER — SPIRONOLACTONE 25 MG PO TABS
12.5000 mg | ORAL_TABLET | Freq: Every day | ORAL | 3 refills | Status: DC
Start: 2021-09-29 — End: 2021-10-15

## 2021-09-29 MED ORDER — BLOOD PRESSURE CUFF MISC: 0 refills | Status: DC

## 2021-09-29 NOTE — Patient Instructions (Signed)
A prescription for Spironolactone 25 mg (take half a tab 12.5 mg daily) was sent to your pharmacy.  I have asked the pharmacy to cut the 25 mg tabs in half as they do not make a 12.5 mg tab.   While taking the new medication we will monitor your potassium levels to make sure they do not increase.  Continue taking your other blood pressure medications losartan 100 mg daily and metoprolol 25 mg twice a day with the new medication.  Continue taking her blood pressure at home.  A prescription for blood pressure cuff was printed.  You can also contact your insurance company to see if they will send you a blood pressure cuff or use your health savings account to obtain one.    A prescription for shower chair was sent to the pharmacy however we may need to contact them if it did not go through.  If your urine culture comes back growing bacteria we will send in a prescription for an antibiotic.  We will have you follow-up in the next month, sooner if needed for continued blood pressure elevation.

## 2021-09-29 NOTE — Progress Notes (Signed)
Subjective:    Patient ID: Felicia Cross, female    DOB: [birth year], 88 y.o.   MRN: [id number]  Chief Complaint  Patient presents with   Follow-up    Re cert for [facility] BP has been elevated for a while, states the facility has lowered dose from what was prescribed. Will be high when sitting and standing.  Has been having dysuria for a few weeks.  Pt accompanied by caregiver.  HPI Patient is an 88 yo female with pmh sig for HTN, seizures, HLD, repeated falls/balance issue, CVD, OA s/p TKR, h/o vit B12 def who was seen today for f/u/  Pt's bp has been elevated 246/117 this am, 193/97, 197/87.  Pt denies changes in diet.  Was unsure if meds were decreased while in rehab facility after a fall.  Pt has 24 hr care at home.  Requesting a rx for a bp cuff and shower chair.  Taking losartan 100 mg and metoprolol 25 mg BID.  Denies HAs. Pt reports exercising L hand throughout the day since being back home.  Pt notes dysuria x wks.  Denies frequency, constipation, n/v, fever.  Past Medical History:  Diagnosis Date   Anemia 01/18/2016   -reports on and off her whole life, on oral iron in the past remotely per report   Arthritis    B12 deficiency 01/18/2016   CAD (coronary artery disease)    CVD (cerebrovascular disease)    Diverticulosis of colon    H. pylori infection 10/20/2015   Hyperlipidemia    Hypertension    Osteoarthritis 03/07/2006   Qualifier: Diagnosis of  By: Leanne Chang MD, Bruce      Allergies  Allergen Reactions   Codeine Nausea And Vomiting   Hydrocodone Nausea And Vomiting    ROS General: Denies fever, chills, night sweats, changes in weight, changes in appetite  +balance problem HEENT: Denies headaches, ear pain, changes in vision, rhinorrhea, sore throat CV: Denies CP, palpitations, SOB, orthopnea Pulm: Denies SOB, cough, wheezing GI: Denies abdominal pain, nausea, vomiting, diarrhea, constipation GU: Denies hematuria, frequency, vaginal discharge +dysuria Msk: Denies muscle  cramps, joint pains  +LUE weakness Neuro: Denies weakness, numbness, tingling Skin: Denies rashes, bruising Psych: Denies depression, anxiety, hallucinations    Objective:    Blood pressure (!) 164/100, pulse 64, temperature 97.8 F (36.6 C), temperature source Oral, SpO2 96 %.  Gen. Pleasant, well-nourished, in no distress, normal affect   HEENT: [place]/AT, face symmetric, conjunctiva clear, no scleral icterus, PERRLA, EOMI, nares patent without drainage Lungs: no accessory muscle use, CTAB, no wheezes or rales Cardiovascular: RRR, no m/r/g, no peripheral edema Musculoskeletal: Decreased strength in LUE 4/5, RUE 5/5. No deformities, no cyanosis or clubbing, normal tone Neuro:  A&Ox3, CN II-XII intact, ambulating with walker Skin:  Warm, no lesions/ rash   Wt Readings from Last 3 Encounters:  07/26/21 156 lb 12 oz (71.1 kg)  07/19/21 154 lb 1.6 oz (69.9 kg)  03/31/21 154 lb 3.2 oz (69.9 kg)    Lab Results  Component Value Date   WBC 7.4 07/24/2021   HGB 11.6 (L) 07/24/2021   HCT 34.6 (L) 07/24/2021   PLT 254 07/24/2021   GLUCOSE 88 07/24/2021   CHOL 209 (H) 12/18/2020   TRIG 61.0 12/18/2020   HDL 63.70 12/18/2020   LDLDIRECT 138.1 05/19/2011   LDLCALC 133 (H) 12/18/2020   ALT 10 07/23/2021   AST 23 07/23/2021   NA 134 (L) 07/24/2021   K 3.7 07/24/2021   CL 99 07/24/2021  CREATININE 0.83 07/24/2021   BUN 7 (L) 07/24/2021   CO2 28 07/24/2021   TSH 2.04 07/05/2021   INR 1.04 09/04/2015   HGBA1C 5.9 12/18/2020    Assessment/Plan:  Dysuria  -POC UA with SG 1.025, 1+nitrite -increase po intake of fluids -further recs such as abx if needed based on UCx results. - Plan: POCT urinalysis dipstick, Culture, Urine  Essential hypertension  -previously well controlled 134/68 on 07/05/21, but now uncontrolled. -recheck bp. -will add spironolactone 12.5 mg daily -continue losartan 100 mg daily and lopressor 25 mg BID -creatinine 0.83 and eGFR >60 on 07/24/21 -consider other  causes of bp elevation including renal artery stenosis, OSA for continued elevation - Plan: spironolactone (ALDACTONE) 25 MG tablet, Blood Pressure Monitoring (BLOOD PRESSURE CUFF) MISC  Balance problem  -CT head 08/01/21 with stable chronic changes -concern for vertigo vs ataxia, NPH -pt encouraged to use assistive device(s) when ambulating. -continue HH and continuous care in home -continue f/u with Neurology - Plan: Shower chair  F/u in 1 month, sooner if needed.  Grier Mitts, MD

## 2021-09-29 NOTE — Telephone Encounter (Signed)
Noted  

## 2021-09-29 NOTE — Telephone Encounter (Signed)
Patient's BP is 197/87 per [facility].  Patient has an appointment today with Dr. Salomon Fick.

## 2021-09-30 ENCOUNTER — Telehealth: Payer: Self-pay

## 2021-09-30 ENCOUNTER — Telehealth: Payer: Medicare Other

## 2021-09-30 ENCOUNTER — Telehealth: Payer: Self-pay | Admitting: Family Medicine

## 2021-09-30 DIAGNOSIS — S06360D Traumatic hemorrhage of cerebrum, unspecified, without loss of consciousness, subsequent encounter: Secondary | ICD-10-CM | POA: Diagnosis not present

## 2021-09-30 DIAGNOSIS — I119 Hypertensive heart disease without heart failure: Secondary | ICD-10-CM | POA: Diagnosis not present

## 2021-09-30 DIAGNOSIS — R569 Unspecified convulsions: Secondary | ICD-10-CM | POA: Diagnosis not present

## 2021-09-30 DIAGNOSIS — I951 Orthostatic hypotension: Secondary | ICD-10-CM | POA: Diagnosis not present

## 2021-09-30 DIAGNOSIS — M4802 Spinal stenosis, cervical region: Secondary | ICD-10-CM | POA: Diagnosis not present

## 2021-09-30 DIAGNOSIS — S134XXD Sprain of ligaments of cervical spine, subsequent encounter: Secondary | ICD-10-CM | POA: Diagnosis not present

## 2021-09-30 NOTE — Telephone Encounter (Signed)
  Care Management   Follow Up Note   09/30/2021 Name: Felicia Cross MRN: [id number] DOB: 02/02/1933   Referred by: Deeann Saint, MD Reason for referral : Care Coordination (RNCM: Initial Outreach Care coordination needs-unable to complete visit)   Pt was reached but paid caregiver unable to speak with RNCM about pt's care due to HIPAA and requested visit be scheduled when daughter is available   Follow Up Plan: The care management team will reach out to the patient again over the next 30 days.   Dudley Major RN, Maximiano Coss, CDE Care Management Coordinator [facility] [phone]

## 2021-09-30 NOTE — Telephone Encounter (Signed)
Noreene Larsson with [facility] called because patient's blood pressure is reading high 176/98 sitting down. No symptoms. Patient did take medication this morning as directed, but had pork sausages and they believe that is what caused it.    Good callback number if need be for Noreene Larsson is [phone]    [facility]

## 2021-10-01 LAB — URINE CULTURE
MICRO NUMBER:: 13439926
SPECIMEN QUALITY:: ADEQUATE

## 2021-10-05 ENCOUNTER — Telehealth: Payer: Self-pay | Admitting: Family Medicine

## 2021-10-05 MED ORDER — SIMVASTATIN 40 MG PO TABS: 40.0000 mg | ORAL_TABLET | Freq: Every day | ORAL | 3 refills | Status: DC

## 2021-10-05 NOTE — Telephone Encounter (Signed)
Pt needs refills of simvastatin (ZOCOR) 40 MG tablet,losartan (COZAAR) 100 MG tablet [place] [address], [place] [id number] Phone:  [phone]  Fax:  [phone]    Last ov 09/29/21

## 2021-10-06 ENCOUNTER — Other Ambulatory Visit: Payer: Self-pay | Admitting: Family Medicine

## 2021-10-06 ENCOUNTER — Telehealth: Payer: Self-pay | Admitting: Family Medicine

## 2021-10-06 ENCOUNTER — Telehealth: Payer: Self-pay | Admitting: *Deleted

## 2021-10-06 DIAGNOSIS — R569 Unspecified convulsions: Secondary | ICD-10-CM | POA: Diagnosis not present

## 2021-10-06 DIAGNOSIS — I119 Hypertensive heart disease without heart failure: Secondary | ICD-10-CM | POA: Diagnosis not present

## 2021-10-06 DIAGNOSIS — N3000 Acute cystitis without hematuria: Secondary | ICD-10-CM

## 2021-10-06 DIAGNOSIS — S134XXD Sprain of ligaments of cervical spine, subsequent encounter: Secondary | ICD-10-CM | POA: Diagnosis not present

## 2021-10-06 DIAGNOSIS — I951 Orthostatic hypotension: Secondary | ICD-10-CM | POA: Diagnosis not present

## 2021-10-06 DIAGNOSIS — S06360D Traumatic hemorrhage of cerebrum, unspecified, without loss of consciousness, subsequent encounter: Secondary | ICD-10-CM | POA: Diagnosis not present

## 2021-10-06 DIAGNOSIS — M4802 Spinal stenosis, cervical region: Secondary | ICD-10-CM | POA: Diagnosis not present

## 2021-10-06 MED ORDER — AMOXICILLIN 500 MG PO TABS: 500.0000 mg | ORAL_TABLET | Freq: Two times a day (BID) | ORAL | 0 refills | Status: AC

## 2021-10-06 NOTE — Telephone Encounter (Signed)
Pt caregiver taylor is calling and pt was seen on 09-29-2021 and they forgot to ask for handicap placard

## 2021-10-06 NOTE — Chronic Care Management (AMB) (Signed)
  Care Coordination Note  10/06/2021 Name: Felicia Cross MRN: [id number] DOB: 1933/04/01  Felicia Cross is a 88 y.o. year old female who is a primary care patient of Billie Ruddy, MD and is actively engaged with the care management team. I reached out to Felicia Cross by phone today to assist with re-scheduling an initial visit with the RN Case Manager  Follow up plan: Unsuccessful telephone outreach attempt made. A HIPAA compliant phone message was left for the patient providing contact information and requesting a return call.  The care management team will reach out to the patient again over the next 7 days.  If patient returns call to provider office, please advise to call [facility] at [phone].  [facility]  Direct Dial: [phone]

## 2021-10-06 NOTE — Chronic Care Management (AMB) (Signed)
  Care Coordination Note  10/06/2021 Name: Felicia Cross MRN: [id number] DOB: Oct 20, 1932  Felicia Cross is a 87 y.o. year old female who is a primary care patient of Deeann Saint, MD and is actively engaged with the care management team. I reached out to Felicia Cross by phone today to assist with re-scheduling an initial visit with the RN Case Manager  Follow up plan: Telephone appointment with care management team member scheduled for:10/29/21  [facility] Guide, Embedded Care Coordination [facility]  Care Management  Direct Dial: [phone]

## 2021-10-07 DIAGNOSIS — R569 Unspecified convulsions: Secondary | ICD-10-CM | POA: Diagnosis not present

## 2021-10-07 DIAGNOSIS — S06360D Traumatic hemorrhage of cerebrum, unspecified, without loss of consciousness, subsequent encounter: Secondary | ICD-10-CM | POA: Diagnosis not present

## 2021-10-07 DIAGNOSIS — S134XXD Sprain of ligaments of cervical spine, subsequent encounter: Secondary | ICD-10-CM | POA: Diagnosis not present

## 2021-10-07 DIAGNOSIS — I119 Hypertensive heart disease without heart failure: Secondary | ICD-10-CM | POA: Diagnosis not present

## 2021-10-07 DIAGNOSIS — M4802 Spinal stenosis, cervical region: Secondary | ICD-10-CM | POA: Diagnosis not present

## 2021-10-07 DIAGNOSIS — I951 Orthostatic hypotension: Secondary | ICD-10-CM | POA: Diagnosis not present

## 2021-10-07 NOTE — Telephone Encounter (Signed)
Patient can complete her portion and drop off handicap placard form for provider completion.

## 2021-10-08 ENCOUNTER — Other Ambulatory Visit: Payer: Self-pay

## 2021-10-08 ENCOUNTER — Emergency Department (HOSPITAL_COMMUNITY): Payer: Medicare Other

## 2021-10-08 ENCOUNTER — Encounter (HOSPITAL_COMMUNITY): Payer: Self-pay

## 2021-10-08 ENCOUNTER — Emergency Department (HOSPITAL_COMMUNITY)
Admission: EM | Admit: 2021-10-08 | Discharge: 2021-10-08 | Disposition: A | Discharge status: Home / Self Care | Payer: Medicare Other | Attending: Emergency Medicine | Admitting: Emergency Medicine

## 2021-10-08 DIAGNOSIS — Z79899 Other long term (current) drug therapy: Secondary | ICD-10-CM | POA: Diagnosis not present

## 2021-10-08 DIAGNOSIS — Z7982 Long term (current) use of aspirin: Secondary | ICD-10-CM | POA: Insufficient documentation

## 2021-10-08 DIAGNOSIS — R6889 Other general symptoms and signs: Secondary | ICD-10-CM | POA: Diagnosis not present

## 2021-10-08 DIAGNOSIS — R404 Transient alteration of awareness: Secondary | ICD-10-CM | POA: Diagnosis not present

## 2021-10-08 DIAGNOSIS — Z743 Need for continuous supervision: Secondary | ICD-10-CM | POA: Diagnosis not present

## 2021-10-08 DIAGNOSIS — R531 Weakness: Secondary | ICD-10-CM | POA: Diagnosis not present

## 2021-10-08 DIAGNOSIS — R42 Dizziness and giddiness: Secondary | ICD-10-CM | POA: Diagnosis not present

## 2021-10-08 DIAGNOSIS — R519 Headache, unspecified: Secondary | ICD-10-CM | POA: Diagnosis not present

## 2021-10-08 DIAGNOSIS — I10 Essential (primary) hypertension: Secondary | ICD-10-CM | POA: Insufficient documentation

## 2021-10-08 LAB — BASIC METABOLIC PANEL
Anion gap: 8 (ref 5–15)
BUN: 10 mg/dL (ref 8–23)
CO2: 29 mmol/L (ref 22–32)
Calcium: 9.7 mg/dL (ref 8.9–10.3)
Chloride: 103 mmol/L (ref 98–111)
Creatinine, Ser: 0.83 mg/dL (ref 0.44–1.00)
GFR, Estimated: >60 mL/min (ref >60)
Glucose, Bld: 75 mg/dL (ref 70–99)
Potassium: 3.9 mmol/L (ref 3.5–5.1)
Sodium: 140 mmol/L (ref 135–145)

## 2021-10-08 LAB — CBC
HCT: 36.4 % (ref 36.0–46.0)
Hemoglobin: 11.5 g/dL — ABNORMAL LOW (ref 12.0–15.0)
MCH: 30.7 pg (ref 26.0–34.0)
MCHC: 31.6 g/dL (ref 30.0–36.0)
MCV: 97.1 fL (ref 80.0–100.0)
Platelets: 240 10*3/uL (ref 150–400)
RBC: 3.75 MIL/uL — ABNORMAL LOW (ref 3.87–5.11)
RDW: 13.8 % (ref 11.5–15.5)
WBC: 6 10*3/uL (ref 4.0–10.5)
nRBC: 0 % (ref 0.0–0.2)

## 2021-10-08 MED ORDER — HYDRALAZINE HCL 20 MG/ML IJ SOLN
20.0000 mg | Freq: Once | INTRAMUSCULAR | Status: AC
Start: 2021-10-08 — End: 2021-10-08
  Administered 2021-10-08: 20 mg via INTRAVENOUS
  Filled 2021-10-08: qty 1

## 2021-10-08 NOTE — ED Notes (Signed)
DC instructed reviewed with pt and pt's daughter. Both verbalized understanding of instructions.

## 2021-10-08 NOTE — ED Triage Notes (Signed)
Pt bib [facility] from home where she lives with her husband, with complaints of an episode of dizziness this morning after taking her medications. Pt also reports htn x 2 weeks and was started on a new medication 10/06/21 and has taken the rest of her medications as prescribed. Pt presents AOx4 with no dizziness or headache now. EMS vitals: 206SBP, 100%RA, 72HR

## 2021-10-08 NOTE — ED Provider Notes (Signed)
3:46 PM Care assumed from Dr. Rosalia Hammers.  At time of transfer care, patient is waiting for MRI brain.  If MRI is negative, anticipate discharge home.  7:22 PM MRI just returned negative.  We will reassess patient and anticipate discharge home.   Clinical Impression: 1. Dizziness   2. Hypertension, unspecified type     Disposition: Discharge  Condition: Good  I have discussed the results, Dx and Tx plan with the pt(& family if present). He/she/they expressed understanding and agree(s) with the plan. Discharge instructions discussed at great length. Strict return precautions discussed and pt &/or family have verbalized understanding of the instructions. No further questions at time of discharge.    New Prescriptions   No medications on file    Follow Up: Deeann Saint, MD [address] [place] [address] [phone]     [facility] EMERGENCY DEPARTMENT [address] [id number] mc [place] [address] [phone]       Gabriele Zwilling, Canary Brim, MD 10/08/21 1949

## 2021-10-08 NOTE — ED Provider Notes (Signed)
[facility] EMERGENCY DEPARTMENT Provider Note   CSN: [id number] Arrival date & time: 10/08/21  1131     History  Chief Complaint  Patient presents with   Hypertension    Felicia Cross is a 88 y.o. female.  HPI    88 year old female history of hypertension presents today with dizziness.  History is obtained from patient and her daughter.  Patient has known high blood pressure and is according to her daughter typically runs of the blood pressure systolically 1 70-1 80.  However her blood pressures have been running higher at the low 200s.  She has been taking her usual medications of losartan and metoprolol.  Her primary physician added a new medication, which appears to be spironolactone, 12.5 mg beginning of this week.  She has been taking this.  Today she awoke and describes dizziness and has been unable to ambulate.  Patient is not on any blood thinners.  She denies any headache.  She denies any other lateralized symptoms but states that sometimes her left foot does not work as well.  She has chronic contractures in her left hand. Home Medications Prior to Admission medications   Medication Sig Start Date End Date Taking? Authorizing Provider  acetaminophen (TYLENOL) 325 MG tablet Take 975 mg by mouth See admin instructions. Take 3 tablets (975 mg) by mouth three times daily, may also take 2 tablets (650 mg) by mouth every 6 hours as needed for moderate pain/headache    [provider]  amoxicillin (AMOXIL) 500 MG tablet Take 1 tablet (500 mg total) by mouth 2 (two) times daily for 7 days. 10/06/21 10/13/21  Deeann Saint, MD  aspirin EC 81 MG tablet Take 1 tablet (81 mg total) by mouth daily. Patient taking differently: Take 81 mg by mouth every other day. 08/18/16   Terressa Koyanagi, DO  Blood Pressure Monitoring (BLOOD PRESSURE CUFF) MISC Use as directed for checking bp daily. 09/29/21   Deeann Saint, MD  levETIRAcetam (KEPPRA) 500 MG tablet Keppra 500  mg twice daily x6 weeks, then to 250mg  BID x 1 week Patient taking differently: 250-500 mg See admin instructions. Ordered 07/27/21: Take one tablet (500 mg) by mouth twice daily for 6 weeks, then take 250 mg twice daily for 1 week 07/27/21   07/29/21, MD  losartan (COZAAR) 100 MG tablet Take 1 tablet by mouth once daily Patient taking differently: Take 100 mg by mouth every morning. 09/28/20   09/30/20, MD  metoprolol tartrate (LOPRESSOR) 25 MG tablet Take 1 tablet by mouth twice daily Patient taking differently: Take 25 mg by mouth 2 (two) times daily. 04/28/21   04/30/21, MD  Multiple Vitamin-Folic Acid TABS Take 1 tablet by mouth every morning.    [provider]  simvastatin (ZOCOR) 40 MG tablet Take 1 tablet (40 mg total) by mouth daily. 10/05/21   10/07/21, MD  spironolactone (ALDACTONE) 25 MG tablet Take 0.5 tablets (12.5 mg total) by mouth daily. 09/29/21   10/01/21, MD  vitamin B-12 (CYANOCOBALAMIN) 1000 MCG tablet Take 1 tablet (1,000 mcg total) by mouth daily. 07/27/21   Ghimire, 07/29/21, MD      Allergies    Codeine and Hydrocodone    Review of Systems   Review of Systems  Physical Exam Updated Vital Signs BP (!) 199/97   Pulse 66   Temp 98.3 F (36.8 C) (Oral)   Resp 14   SpO2  100%  Physical Exam Vitals and nursing note reviewed.  Constitutional:      Appearance: Normal appearance.  HENT:     Right Ear: External ear normal.     Left Ear: External ear normal.     Nose: Nose normal.     Mouth/Throat:     Pharynx: Oropharynx is clear.  Eyes:     Extraocular Movements: Extraocular movements intact.     Pupils: Pupils are equal, round, and reactive to light.  Cardiovascular:     Rate and Rhythm: Normal rate and regular rhythm.     Pulses: Normal pulses.  Pulmonary:     Effort: Pulmonary effort is normal.     Breath sounds: Normal breath sounds.  Abdominal:     General: Abdomen is flat.     Palpations: Abdomen is  soft.  Musculoskeletal:        General: Normal range of motion.     Cervical back: Normal range of motion.  Skin:    General: Skin is warm and dry.     Capillary Refill: Capillary refill takes less than 2 seconds.  Neurological:     Mental Status: She is alert.     Comments: Extraocular movements are intact No visual field deficit noted Face is symmetric Left hand has contractures and patient is unable to fully move her left upper extremity so is difficult to test Right upper extremity appears to have normal strength There is no leg drift noted Left heel-to-shin appears somewhat ataxic Right heel-to-shin is normal    ED Results / Procedures / Treatments   Labs (all labs ordered are listed, but only abnormal results are displayed) Labs Reviewed  CBC - Abnormal; Notable for the following components:      Result Value   RBC 3.75 (*)    Hemoglobin 11.5 (*)    All other components within normal limits  BASIC METABOLIC PANEL    EKG EKG Interpretation  Date/Time:  Friday October 08 2021 13:50:20 EDT Ventricular Rate:  69 PR Interval:  146 QRS Duration: 86 QT Interval:  401 QTC Calculation: 430 R Axis:   -28 Text Interpretation: Sinus rhythm Consider left ventricular hypertrophy Since last tracing PVCs have resolved Confirmed by Margarita Grizzle [phone] on 10/08/2021 2:57:00 PM  Radiology CT Head Wo Contrast  Result Date: 10/08/2021 CLINICAL DATA:  Headache. EXAM: CT HEAD WITHOUT CONTRAST TECHNIQUE: Contiguous axial images were obtained from the base of the skull through the vertex without intravenous contrast. RADIATION DOSE REDUCTION: This exam was performed according to the departmental dose-optimization program which includes automated exposure control, adjustment of the mA and/or kV according to patient size and/or use of iterative reconstruction technique. COMPARISON:  08/01/2021. FINDINGS: Brain: No evidence of acute infarction, hemorrhage, hydrocephalus, extra-axial collection or  mass lesion/mass effect. Vascular: No hyperdense vessel or unexpected calcification. Skull: Normal. Negative for fracture or focal lesion. Sinuses/Orbits: Globes and orbits are unremarkable. Visualized sinuses are clear. Other: None. IMPRESSION: 1. No acute intracranial abnormalities. Electronically Signed   By: Amie Portland M.D.   On: 10/08/2021 13:24    Procedures Procedures    Medications Ordered in ED Medications  hydrALAZINE (APRESOLINE) injection 20 mg (20 mg Intravenous Given 10/08/21 1439)    ED Course/ Medical Decision Making/ A&P Clinical Course as of 10/08/21 1550  Fri Oct 08, 2021  1455 CT reviewed and interpreted no evidence of acute abnormalities are noted and radiologist interpretation concurs [DR]  1549 CBC reviewed interpreted and stable anemia with hemoglobin 11.5 [DR]  1550 Basic metabolic panel [place] reviewed interpreted and normal [DR]    Clinical Course User Index [DR] Margarita Grizzleay, Kima Malenfant, MD                           Medical Decision Making 88 year old female with ongoing chronic hypertension presents today with dizziness.  She is significantly hypertensive with systolic blood pressures greater than 200. Patient has some ataxia versus chronic weakness noted on her exam.  She has some history of left-sided weakness CT of head does not show any evidence of acute abnormalities. MRI is pending Patient was treated here with IV medication for her hypertension.  She received hydralazine 20 mg. Last blood pressure noted was 130.  Patient remained hemodynamically stable. May be discharged if MRI is negative. Discussed with patient and daughter follow-up plan for recheck of blood pressure and changes in blood pressure medications with her primary provider.  Amount and/or Complexity of Data Reviewed Labs: ordered. Radiology: ordered.  Risk Prescription drug management.           Final Clinical Impression(s) / ED Diagnoses Final diagnoses:  Dizziness   Hypertension, unspecified type    Rx / DC Orders ED Discharge Orders     None         Margarita Grizzleay, Autumn Gunn, MD 10/08/21 1550

## 2021-10-08 NOTE — Discharge Instructions (Signed)
Your work-up today was negative for acute stroke.  The previous team felt you are safe to discharge home if the MRI was negative.  Please follow-up with your primary doctor and rest and stay hydrated.  As we discussed, please call them tomorrow to discuss blood pressure medication titration and management.  If any symptoms change or worsen acutely, please return to the nearest emergency department

## 2021-10-08 NOTE — ED Notes (Signed)
Pt in MRI.

## 2021-10-10 ENCOUNTER — Encounter: Payer: Self-pay | Admitting: Family Medicine

## 2021-10-11 ENCOUNTER — Telehealth: Payer: Self-pay | Admitting: Family Medicine

## 2021-10-11 DIAGNOSIS — I951 Orthostatic hypotension: Secondary | ICD-10-CM | POA: Diagnosis not present

## 2021-10-11 DIAGNOSIS — S06360D Traumatic hemorrhage of cerebrum, unspecified, without loss of consciousness, subsequent encounter: Secondary | ICD-10-CM | POA: Diagnosis not present

## 2021-10-11 DIAGNOSIS — I119 Hypertensive heart disease without heart failure: Secondary | ICD-10-CM | POA: Diagnosis not present

## 2021-10-11 DIAGNOSIS — S134XXD Sprain of ligaments of cervical spine, subsequent encounter: Secondary | ICD-10-CM | POA: Diagnosis not present

## 2021-10-11 DIAGNOSIS — R569 Unspecified convulsions: Secondary | ICD-10-CM | POA: Diagnosis not present

## 2021-10-11 DIAGNOSIS — M4802 Spinal stenosis, cervical region: Secondary | ICD-10-CM | POA: Diagnosis not present

## 2021-10-11 NOTE — Telephone Encounter (Signed)
hi, I have Felicia Cross home health aid for Felicia Cross patient Felicia Cross patient was in ED over the weekend for her blood pressure and dizziness. they are seeking guidance on adjusting her meds today, to switch from 1/2 pill to whole pill.  Bp reading for today sitting 170/91 standing 198/122.

## 2021-10-11 NOTE — Telephone Encounter (Signed)
Okay to increase Spironolactone 12.5 mg to 25 mg daily (a whole pill instead of the half pill).  Continue other BP medications and monitoring BP.

## 2021-10-11 NOTE — Telephone Encounter (Signed)
Patient granddaughter stopped by and dropped off paperwork that needs to be completed. Placed in folder to be completed.    Please advise

## 2021-10-11 NOTE — Telephone Encounter (Signed)
Spoke with Lovena Le, HHA, and she stated they will drop off the form with patient's portion completed.

## 2021-10-11 NOTE — Telephone Encounter (Signed)
Spoke with Ladona Ridgel, informed her of med change per PCP as noted in below msg.

## 2021-10-12 ENCOUNTER — Telehealth: Payer: Self-pay | Admitting: Cardiology

## 2021-10-12 ENCOUNTER — Telehealth: Payer: Self-pay | Admitting: Family Medicine

## 2021-10-12 DIAGNOSIS — S134XXD Sprain of ligaments of cervical spine, subsequent encounter: Secondary | ICD-10-CM | POA: Diagnosis not present

## 2021-10-12 DIAGNOSIS — R569 Unspecified convulsions: Secondary | ICD-10-CM | POA: Diagnosis not present

## 2021-10-12 DIAGNOSIS — I951 Orthostatic hypotension: Secondary | ICD-10-CM | POA: Diagnosis not present

## 2021-10-12 DIAGNOSIS — S06360D Traumatic hemorrhage of cerebrum, unspecified, without loss of consciousness, subsequent encounter: Secondary | ICD-10-CM | POA: Diagnosis not present

## 2021-10-12 DIAGNOSIS — M4802 Spinal stenosis, cervical region: Secondary | ICD-10-CM | POA: Diagnosis not present

## 2021-10-12 DIAGNOSIS — I119 Hypertensive heart disease without heart failure: Secondary | ICD-10-CM | POA: Diagnosis not present

## 2021-10-12 NOTE — Telephone Encounter (Signed)
Pt Physical Therapist called stating pt BP reading was 180/100 today with no other symptoms.   Please advise.

## 2021-10-12 NOTE — Telephone Encounter (Signed)
Pt c/o BP issue: STAT if pt c/o blurred vision, one-sided weakness or slurred speech  1. What are your last 5 BP readings?  162/92 standing 155/93 sitting  2. Are you having any other symptoms (ex. Dizziness, headache, blurred vision, passed out)? dizzy  3. What is your BP issue? Daughter concerned with patient's BP readings. Patient went to the ED Friday but BP is still not coming down

## 2021-10-12 NOTE — Telephone Encounter (Signed)
-  Spoke to pt's daughter who state she is concerned due to her mother's BP being elevated (readings listed) below. -She state BP has been dizzy and was recently evaluated in the ER on 6/2 -She report pt denies headache, blurred vision, or any other symptoms.  167/92 sitting 155/93 standing 189/100  Pt taking Spironolactone 12.5 mg daily Metoprolol 25 mg BID Losartan 100 mg daily   -Daughter report pcp recently prescribed amoxicillin for BP.  -Nurse informed daughter per chart review, pcp ordered amoxicillin for UTI on 5/31 and increased spironolactone to 25 mg daily on 6/5.  -Daughter state she was unaware and will increase dose today but keep scheduled appointment on 6/9 with APP.

## 2021-10-13 ENCOUNTER — Telehealth: Payer: Self-pay | Admitting: Family Medicine

## 2021-10-13 NOTE — Telephone Encounter (Signed)
Felicia Cross from [facility] call and stated she need a verbal order for PT 2 x a wk for 3 wk''s and once a wk for 1 wk.

## 2021-10-13 NOTE — Telephone Encounter (Signed)
Noted.  Patient should have follow-up appointment for BP coming up.  Just started increased dose of spironolactone a few days ago.

## 2021-10-13 NOTE — Telephone Encounter (Signed)
Spoke with pt, is aware form is completed and at front desk for pickup.

## 2021-10-13 NOTE — Telephone Encounter (Signed)
Attempted to call [facility], no answer. Unable to leave a VM as it was not a confidential VM. Called to give okay per Dr Salomon Fick for VO for [facility].

## 2021-10-13 NOTE — Progress Notes (Signed)
Cardiology Clinic Note   Patient Name: Felicia Cross Date of Encounter: 10/15/2021  Primary Care Provider:  Deeann Saint, MD Primary Cardiologist:  Dr. Jens Som   Patient Profile    88 year old female with known history of hypertension, minimal CAD, and hyperlipidemia.  The patient has frequent falls related to dizziness on meclizine.  On last office visit on 02/15/2021 I decreased her HCTZ to 12.5 mg daily from 25 mg daily to allow for permissive hypertension to avoid dizzy spells which may have been related to hypotension with position changes.    Consideration for repeating carotid Doppler studies was discussed if the patient continues to have falls and dizzy spells.  She was seen in the ED on 10/08/2021 for dizziness and hypertension.  She had been seen by her PCP earlier and had been started on spironolactone 12.5 mg the first week of June 2023.  Past Medical History    Past Medical History:  Diagnosis Date   Anemia 01/18/2016   -reports on and off her whole life, on oral iron in the past remotely per report   Arthritis    B12 deficiency 01/18/2016   CAD (coronary artery disease)    CVD (cerebrovascular disease)    Diverticulosis of colon    H. pylori infection 10/20/2015   Hyperlipidemia    Hypertension    Osteoarthritis 03/07/2006   Qualifier: Diagnosis of  By: Cato Mulligan MD, Bruce     Past Surgical History:  Procedure Laterality Date   ABDOMINAL HYSTERECTOMY  1984   REPLACEMENT TOTAL KNEE  2007   left   TOTAL HIP ARTHROPLASTY  2010   Dr. Turner Daniels, right   TOTAL KNEE ARTHROPLASTY Right 09/14/2015   Procedure: TOTAL KNEE ARTHROPLASTY;  Surgeon: Dannielle Huh, MD;  Location: [facility] OR;  Service: Orthopedics;  Laterality: Right;    Allergies  Allergies  Allergen Reactions   Codeine Nausea And Vomiting   Hydrocodone Nausea And Vomiting    History of Present Illness    [name] presents today for ongoing assessment and management of hypertension with recent ED visit for  fall and hypertensive urgency with a blood pressure of 199/97.  She had recently been started on spironolactone by primary care provider at 12.5 mg daily.  She awoke and was very dizzy and lightheaded and fell.  She was ruled out for CVA.  She was treated with hydralazine 20 mg IV x1 with improvement of systolic blood pressure to 130.  She was to continue her medications at home as prescribed and take her blood pressures daily and record them to bring to follow-up appointment.    Home health nurse followed her and was found to have blood pressures of 170/91 and 198/122 respectively.  Primary care increase spironolactone from 12.5 mg to 25 mg daily and she was to continue her other medications as directed.  She was to undergo physical therapy however she was found to be hypertensive on 10/13/2021 with a blood pressure of 180/100 and therefore therapy was not completed.  She comes today with home health aide.  She states that she feels well today.  She states that she likes to eat payday candy bars at nighttime which may be contributing to hypertension due to the high salt content.  Her granddaughter is being very careful concerning salty foods and is not adding salt when she cooks.  She is medically compliant.  She continues to work with home physical therapy and has been using a walker.  She does appear frail but  states and is verified by her home health aide, that she is up using the walker within her home when she wishes to ambulate.  She has not fallen in several months.    Home Medications    Current Outpatient Medications  Medication Sig Dispense Refill   acetaminophen (TYLENOL) 325 MG tablet Take 975 mg by mouth See admin instructions. Take 3 tablets (975 mg) by mouth three times daily, may also take 2 tablets (650 mg) by mouth every 6 hours as needed for moderate pain/headache     amoxicillin (AMOXIL) 500 MG capsule Take 500 mg by mouth 2 (two) times daily.     aspirin EC 81 MG tablet Take 1  tablet (81 mg total) by mouth daily. (Patient taking differently: Take 81 mg by mouth every other day.)     Blood Pressure Monitoring (BLOOD PRESSURE CUFF) MISC Use as directed for checking bp daily. 1 each 0   levETIRAcetam (KEPPRA) 500 MG tablet Keppra 500 mg twice daily x6 weeks, then to  BID x 1 week (Patient taking differently: 250-500 mg See admin instructions. Ordered 07/27/21: Take one tablet (500 mg) by mouth twice daily for 6 weeks, then take 250 mg twice daily for 1 week)     losartan (COZAAR) 100 MG tablet Take 1 tablet by mouth once daily (Patient taking differently: Take 100 mg by mouth every morning.) 90 tablet 1   metoprolol tartrate (LOPRESSOR) 25 MG tablet Take 1 tablet by mouth twice daily (Patient taking differently: Take 25 mg by mouth 2 (two) times daily.) 180 tablet 0   Multiple Vitamin-Folic Acid TABS Take 1 tablet by mouth every morning.     simvastatin (ZOCOR) 40 MG tablet Take 1 tablet (40 mg total) by mouth daily. 46 tablet 3   spironolactone (ALDACTONE) 25 MG tablet Take 1 tablet (25 mg total) by mouth daily. 30 tablet 3   vitamin B-12 (CYANOCOBALAMIN) 1000 MCG tablet Take 1 tablet (1,000 mcg total) by mouth daily.     No current facility-administered medications for this visit.     Family History    Family History  Problem Relation Age of Onset   Diabetes Mother    Hypertension Father    Kidney disease Father    Heart attack Sister    Heart attack Brother    Colon cancer Neg Hx    Stomach cancer Neg Hx    Esophageal cancer Neg Hx    Rectal cancer Neg Hx    She indicated that her mother is deceased. She indicated that her father is deceased. She indicated that the status of her sister is unknown. She indicated that the status of her brother is unknown. She indicated that the status of her neg hx is unknown.  Social History    Social History   Socioeconomic History   Marital status: Married    Spouse name: Not on file   Number of children: Not on  file   Years of education: Not on file   Highest education level: Not on file  Occupational History   Not on file  Tobacco Use   Smoking status: Former    Packs/day: 1.00    Years: 30.00    Total pack years: 30.00    Types: Cigarettes    Quit date: 11/01/1980    Years since quitting: 40.9   Smokeless tobacco: Never  Substance and Sexual Activity   Alcohol use: Yes    Alcohol/week: 0.0 standard drinks of alcohol    Comment:  glass of wine   Drug use: No   Sexual activity: Not on file  Other Topics Concern   Not on file  Social History Narrative   Updated 01/07/15   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 [place]      Home 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, Information systems manager and two great grandchildren age 3 and 67      Spiritual Beliefs: Christian      Lifestyle: regular exercise at the [employer]; healthy diet      Social Determinants of Health   Financial Resource Strain: Not on file  Food Insecurity: Not on file  Transportation Needs: Not on file  Physical Activity: Not on file  Stress: Not on file  Social Connections: Not on file  Intimate Partner Violence: Not on file     Review of Systems    General:  No chills, fever, night sweats or weight changes.  Cardiovascular:  No chest pain, dyspnea on exertion, edema, orthopnea, palpitations, paroxysmal nocturnal dyspnea. Dermatological: No rash, lesions/masses Respiratory: No cough, dyspnea Urologic: No hematuria, dysuria Abdominal:   No nausea, vomiting, diarrhea, bright red blood per rectum, melena, or hematemesis Neurologic:  No visual changes, wkns, changes in mental status. All other systems reviewed and are otherwise negative except as noted above.     Physical Exam    VS:  BP (!) 169/85   Pulse 66   Ht 5\' 4"  (1.626 m)   Wt 158 lb (71.7 kg)   SpO2 99%   BMI 27.12 kg/m  , BMI Body mass index is 27.12 kg/m.     GEN: Well nourished, well developed, in no acute distress.  Sitting in a  wheelchair HEENT: normal. Neck: Supple, no JVD, carotid bruits, or masses. Cardiac: RRR, split S2 murmurs, rubs, or gallops. No clubbing, cyanosis, edema.  Radials/DP/PT 2+ and equal bilaterally.  Respiratory:  Respirations regular and unlabored, clear to auscultation bilaterally. GI: Soft, nontender, nondistended, BS + x 4. MS: no deformity or atrophy. Skin: warm and dry, no rash. Neuro:  Strength and sensation are intact. Psych: Normal affect.  Accessory Clinical Findings    Lab Results  Component Value Date   WBC 6.0 10/08/2021   HGB 11.5 (L) 10/08/2021   HCT 36.4 10/08/2021   MCV 97.1 10/08/2021   PLT 240 10/08/2021   Lab Results  Component Value Date   CREATININE 0.83 10/08/2021   BUN 10 10/08/2021   NA 140 10/08/2021   K 3.9 10/08/2021   CL 103 10/08/2021   CO2 29 10/08/2021   Lab Results  Component Value Date   ALT 10 07/23/2021   AST 23 07/23/2021   ALKPHOS 33 (L) 07/23/2021   BILITOT 0.7 07/23/2021   Lab Results  Component Value Date   CHOL 209 (H) 12/18/2020   HDL 63.70 12/18/2020   LDLCALC 133 (H) 12/18/2020   LDLDIRECT 138.1 05/19/2011   TRIG 61.0 12/18/2020   CHOLHDL 3 12/18/2020    Lab Results  Component Value Date   HGBA1C 5.9 12/18/2020    Review of Prior Studies: Echocardiogram 07/24/2021  1. Left ventricular ejection fraction, by estimation, is 60 to 65%. The  left ventricle has normal function. The left ventricle has no regional  wall motion abnormalities. There is moderate left ventricular hypertrophy  of the basal segment. Left  ventricular diastolic parameters are consistent with Grade I diastolic  dysfunction (impaired relaxation).   2. Right ventricular systolic function is normal. The right ventricular  size is normal.  There is mildly elevated pulmonary artery systolic  pressure.   3. No evidence of mitral valve regurgitation.   4. Aortic valve regurgitation is not visualized. Aortic valve  sclerosis/calcification is present,  without any evidence of aortic  stenosis.   5. The inferior vena cava is dilated in size with >50% respiratory  variability, suggesting right atrial pressure of 8 mmHg.   Assessment & Plan   1.  Hypertension: This has been difficult to control.  She has been seen in the ED recently due to hypertensive urgency.  She was also found to be hypertensive after taking medications just prior to undergoing physical therapy at home.  At that time she was advised to take an additional 12.5 mg of spironolactone, for total of 25 mg of spironolactone that day.  This normalized her blood pressure.  I will continue the dose of spironolactone 25 mg daily for now along with losartan 100 mg daily and metoprolol 25 mg twice daily.  They are to continue to keep track of her blood pressure in her home environment through the home health aide or physical therapy.  Want to be careful not to cause hypotension as well.  She will continue to use her walker for ambulation and support.  I will see her back in a month.  She is due to see her primary care provider Dr. Salomon FickBanks on 21 June, 2023.  Labs will be drawn at that time.  We will make further recommendations about continuing higher dose of spironolactone once we look at the labs especially to evaluate for hyperkalemia on ARB.  2.  Hyperlipidemia: Currently on simvastatin 40 mg daily.  Labs are being drawn by PCP in the next couple of weeks.  3.  Frequent falls with frailty: Being followed at home by physical therapy and home health.  Recommend continued physical therapy for strengthening at the discretion of her primary care provider.    Current medicines are reviewed at length with the patient today.  I have spent 25 min's  dedicated to the care of this patient on the date of this encounter to include pre-visit review of records, assessment, management and diagnostic testing,with shared decision making. Signed, Bettey MareKathryn M. Liborio NixonLawrence DNP, ANP, AACC   10/15/2021 12:12 PM     [facility] [address] Office [phone] Fax [phone]  Notice: This dictation was prepared with Dragon dictation along with smaller phrase technology. Any transcriptional errors that result from this process are unintentional and may not be corrected upon review.

## 2021-10-14 DIAGNOSIS — R569 Unspecified convulsions: Secondary | ICD-10-CM | POA: Diagnosis not present

## 2021-10-14 DIAGNOSIS — I119 Hypertensive heart disease without heart failure: Secondary | ICD-10-CM | POA: Diagnosis not present

## 2021-10-14 DIAGNOSIS — M4802 Spinal stenosis, cervical region: Secondary | ICD-10-CM | POA: Diagnosis not present

## 2021-10-14 DIAGNOSIS — S06360D Traumatic hemorrhage of cerebrum, unspecified, without loss of consciousness, subsequent encounter: Secondary | ICD-10-CM | POA: Diagnosis not present

## 2021-10-14 DIAGNOSIS — S134XXD Sprain of ligaments of cervical spine, subsequent encounter: Secondary | ICD-10-CM | POA: Diagnosis not present

## 2021-10-14 DIAGNOSIS — I951 Orthostatic hypotension: Secondary | ICD-10-CM | POA: Diagnosis not present

## 2021-10-14 NOTE — Telephone Encounter (Signed)
Spoke with Zelphia Cairo, per Dr Volanda Napoleon, gave okay for VO.

## 2021-10-15 ENCOUNTER — Encounter: Payer: Self-pay | Admitting: Adult Health

## 2021-10-15 ENCOUNTER — Ambulatory Visit (INDEPENDENT_AMBULATORY_CARE_PROVIDER_SITE_OTHER): Payer: Medicare Other | Admitting: Adult Health

## 2021-10-15 VITALS — BP 169/85 | HR 66 | Ht 64.0 in | Wt 158.0 lb

## 2021-10-15 DIAGNOSIS — R54 Age-related physical debility: Secondary | ICD-10-CM | POA: Diagnosis not present

## 2021-10-15 DIAGNOSIS — I10 Essential (primary) hypertension: Secondary | ICD-10-CM

## 2021-10-15 DIAGNOSIS — E7800 Pure hypercholesterolemia, unspecified: Secondary | ICD-10-CM | POA: Diagnosis not present

## 2021-10-15 MED ORDER — SPIRONOLACTONE 25 MG PO TABS: 25.0000 mg | ORAL_TABLET | Freq: Every day | ORAL | 3 refills | Status: DC

## 2021-10-15 NOTE — Patient Instructions (Signed)
Medication Instructions:  Increase Spironolactone to 25 mg ( Take 1 Tablet Daily). *If you need a refill on your cardiac medications before your next appointment, please call your pharmacy*   Lab Work: No Labs If you have labs (blood work) drawn today and your tests are completely normal, you will receive your results only by: MyChart Message (if you have MyChart) OR A paper copy in the mail If you have any lab test that is abnormal or we need to change your treatment, we will call you to review the results.   Testing/Procedures: No Testing   Follow-Up: At [facility], you and your health needs are our priority.  As part of our continuing mission to provide you with exceptional heart care, we have created designated Provider Care Teams.  These Care Teams include your primary Cardiologist (physician) and Advanced Practice Providers (APPs -  Physician Assistants and Nurse Practitioners) who all work together to provide you with the care you need, when you need it.  We recommend signing up for the patient portal called "MyChart".  Sign up information is provided on this After Visit Summary.  MyChart is used to connect with patients for Virtual Visits (Telemedicine).  Patients are able to view lab/test results, encounter notes, upcoming appointments, etc.  Non-urgent messages can be sent to your provider as well.   To learn more about what you can do with MyChart, go to [email or web address].    Your next appointment:   1 month(s)  The format for your next appointment:   In Person  Provider:   Joni Reining, DNP, ANP    Then, Olga Millers MD will plan to see you again in 3 month(s).      Important Information About Sugar

## 2021-10-18 ENCOUNTER — Telehealth: Payer: Self-pay | Admitting: Family Medicine

## 2021-10-18 ENCOUNTER — Ambulatory Visit: Payer: Medicare Other | Admitting: General Practice

## 2021-10-18 MED ORDER — LOSARTAN POTASSIUM 100 MG PO TABS: 100.0000 mg | ORAL_TABLET | Freq: Every day | ORAL | 1 refills | Status: DC

## 2021-10-18 NOTE — Telephone Encounter (Signed)
Rx sent in

## 2021-10-18 NOTE — Telephone Encounter (Signed)
Pt needing refill of losartan (COZAAR) 100 MG tablet   [facility] [address], [place] [address] Phone:  [phone]  Fax:  [phone]

## 2021-10-19 DIAGNOSIS — R569 Unspecified convulsions: Secondary | ICD-10-CM | POA: Diagnosis not present

## 2021-10-19 DIAGNOSIS — S06360D Traumatic hemorrhage of cerebrum, unspecified, without loss of consciousness, subsequent encounter: Secondary | ICD-10-CM | POA: Diagnosis not present

## 2021-10-19 DIAGNOSIS — M4802 Spinal stenosis, cervical region: Secondary | ICD-10-CM | POA: Diagnosis not present

## 2021-10-19 DIAGNOSIS — I119 Hypertensive heart disease without heart failure: Secondary | ICD-10-CM | POA: Diagnosis not present

## 2021-10-19 DIAGNOSIS — S134XXD Sprain of ligaments of cervical spine, subsequent encounter: Secondary | ICD-10-CM | POA: Diagnosis not present

## 2021-10-19 DIAGNOSIS — I951 Orthostatic hypotension: Secondary | ICD-10-CM | POA: Diagnosis not present

## 2021-10-20 DIAGNOSIS — S134XXD Sprain of ligaments of cervical spine, subsequent encounter: Secondary | ICD-10-CM | POA: Diagnosis not present

## 2021-10-20 DIAGNOSIS — R569 Unspecified convulsions: Secondary | ICD-10-CM | POA: Diagnosis not present

## 2021-10-20 DIAGNOSIS — S06360D Traumatic hemorrhage of cerebrum, unspecified, without loss of consciousness, subsequent encounter: Secondary | ICD-10-CM | POA: Diagnosis not present

## 2021-10-20 DIAGNOSIS — I119 Hypertensive heart disease without heart failure: Secondary | ICD-10-CM | POA: Diagnosis not present

## 2021-10-20 DIAGNOSIS — I951 Orthostatic hypotension: Secondary | ICD-10-CM | POA: Diagnosis not present

## 2021-10-20 DIAGNOSIS — M4802 Spinal stenosis, cervical region: Secondary | ICD-10-CM | POA: Diagnosis not present

## 2021-10-21 ENCOUNTER — Telehealth: Payer: Self-pay | Admitting: Family Medicine

## 2021-10-21 DIAGNOSIS — S06360D Traumatic hemorrhage of cerebrum, unspecified, without loss of consciousness, subsequent encounter: Secondary | ICD-10-CM | POA: Diagnosis not present

## 2021-10-21 DIAGNOSIS — I119 Hypertensive heart disease without heart failure: Secondary | ICD-10-CM | POA: Diagnosis not present

## 2021-10-21 DIAGNOSIS — R569 Unspecified convulsions: Secondary | ICD-10-CM | POA: Diagnosis not present

## 2021-10-21 DIAGNOSIS — M4802 Spinal stenosis, cervical region: Secondary | ICD-10-CM | POA: Diagnosis not present

## 2021-10-21 DIAGNOSIS — I951 Orthostatic hypotension: Secondary | ICD-10-CM | POA: Diagnosis not present

## 2021-10-21 DIAGNOSIS — S134XXD Sprain of ligaments of cervical spine, subsequent encounter: Secondary | ICD-10-CM | POA: Diagnosis not present

## 2021-10-21 NOTE — Telephone Encounter (Signed)
Felicia Cross from [facility] call and stated pt BP was 163/106 this morning and right leg weakness  and more instability she also stated she wanted her to go to the Hospital but pt refuse to go.Jill's # is [phone].

## 2021-10-22 NOTE — Telephone Encounter (Signed)
Spoke with pt, states she is not having weakness in leg today, and BP is dunning the same. Informed the pt per PCP that if she has any weakness, she needs to go to ED. Pt verbally agreed.

## 2021-10-26 ENCOUNTER — Telehealth: Payer: Self-pay | Admitting: Family Medicine

## 2021-10-26 DIAGNOSIS — R300 Dysuria: Secondary | ICD-10-CM

## 2021-10-26 DIAGNOSIS — I951 Orthostatic hypotension: Secondary | ICD-10-CM | POA: Diagnosis not present

## 2021-10-26 DIAGNOSIS — S134XXD Sprain of ligaments of cervical spine, subsequent encounter: Secondary | ICD-10-CM | POA: Diagnosis not present

## 2021-10-26 DIAGNOSIS — S06360D Traumatic hemorrhage of cerebrum, unspecified, without loss of consciousness, subsequent encounter: Secondary | ICD-10-CM | POA: Diagnosis not present

## 2021-10-26 DIAGNOSIS — R569 Unspecified convulsions: Secondary | ICD-10-CM | POA: Diagnosis not present

## 2021-10-26 DIAGNOSIS — M4802 Spinal stenosis, cervical region: Secondary | ICD-10-CM | POA: Diagnosis not present

## 2021-10-26 DIAGNOSIS — I119 Hypertensive heart disease without heart failure: Secondary | ICD-10-CM | POA: Diagnosis not present

## 2021-10-26 NOTE — Telephone Encounter (Signed)
PT's HH aide is calling to request a test for UTI.  Pt is experiencing symptoms again. She would like to come pick it up, if possible.  Please advise. (IC)

## 2021-10-26 NOTE — Telephone Encounter (Signed)
Caregiver and Felicia Cross from [facility] health wanted to report a fall with no injuries.  Pt lost her balance at the sink while doing morning routine and fell.

## 2021-10-27 ENCOUNTER — Ambulatory Visit: Payer: Medicare Other | Admitting: Family Medicine

## 2021-10-27 NOTE — Telephone Encounter (Signed)
Ok

## 2021-10-28 DIAGNOSIS — R569 Unspecified convulsions: Secondary | ICD-10-CM | POA: Diagnosis not present

## 2021-10-28 DIAGNOSIS — S06360D Traumatic hemorrhage of cerebrum, unspecified, without loss of consciousness, subsequent encounter: Secondary | ICD-10-CM | POA: Diagnosis not present

## 2021-10-28 DIAGNOSIS — I951 Orthostatic hypotension: Secondary | ICD-10-CM | POA: Diagnosis not present

## 2021-10-28 DIAGNOSIS — M4802 Spinal stenosis, cervical region: Secondary | ICD-10-CM | POA: Diagnosis not present

## 2021-10-28 DIAGNOSIS — S134XXD Sprain of ligaments of cervical spine, subsequent encounter: Secondary | ICD-10-CM | POA: Diagnosis not present

## 2021-10-28 DIAGNOSIS — I119 Hypertensive heart disease without heart failure: Secondary | ICD-10-CM | POA: Diagnosis not present

## 2021-10-28 NOTE — Telephone Encounter (Signed)
Ok for UA order and culture.

## 2021-10-29 ENCOUNTER — Ambulatory Visit: Payer: Medicare Other

## 2021-10-29 DIAGNOSIS — I10 Essential (primary) hypertension: Secondary | ICD-10-CM

## 2021-11-01 ENCOUNTER — Inpatient Hospital Stay: Payer: Medicare Other | Admitting: Neurology

## 2021-11-01 DIAGNOSIS — M4802 Spinal stenosis, cervical region: Secondary | ICD-10-CM | POA: Diagnosis not present

## 2021-11-01 DIAGNOSIS — R569 Unspecified convulsions: Secondary | ICD-10-CM | POA: Diagnosis not present

## 2021-11-01 DIAGNOSIS — I119 Hypertensive heart disease without heart failure: Secondary | ICD-10-CM | POA: Diagnosis not present

## 2021-11-01 DIAGNOSIS — S06360D Traumatic hemorrhage of cerebrum, unspecified, without loss of consciousness, subsequent encounter: Secondary | ICD-10-CM | POA: Diagnosis not present

## 2021-11-01 DIAGNOSIS — I951 Orthostatic hypotension: Secondary | ICD-10-CM | POA: Diagnosis not present

## 2021-11-01 DIAGNOSIS — S134XXD Sprain of ligaments of cervical spine, subsequent encounter: Secondary | ICD-10-CM | POA: Diagnosis not present

## 2021-11-02 ENCOUNTER — Other Ambulatory Visit: Payer: Medicare Other

## 2021-11-02 DIAGNOSIS — R300 Dysuria: Secondary | ICD-10-CM | POA: Diagnosis not present

## 2021-11-03 ENCOUNTER — Telehealth: Payer: Self-pay | Admitting: Family Medicine

## 2021-11-03 ENCOUNTER — Ambulatory Visit: Payer: Medicare Other | Admitting: Family Medicine

## 2021-11-03 LAB — URINALYSIS
Bilirubin Urine: NEGATIVE
Hgb urine dipstick: NEGATIVE
Ketones, ur: NEGATIVE
Leukocytes,Ua: NEGATIVE
Nitrite: NEGATIVE
Specific Gravity, Urine: 1.01 (ref 1.000–1.030)
Total Protein, Urine: NEGATIVE
Urine Glucose: NEGATIVE
Urobilinogen, UA: 1 (ref 0.0–1.0)
pH: 7.5 (ref 5.0–8.0)

## 2021-11-03 NOTE — Telephone Encounter (Signed)
Ok

## 2021-11-03 NOTE — Telephone Encounter (Signed)
[facility] is calling and will be d/c pt from PT next week. Barbette Or is calling and would like verbal order for nursing evaluation for pt blood pressure

## 2021-11-04 ENCOUNTER — Other Ambulatory Visit: Payer: Self-pay | Admitting: Family Medicine

## 2021-11-04 DIAGNOSIS — I951 Orthostatic hypotension: Secondary | ICD-10-CM | POA: Diagnosis not present

## 2021-11-04 DIAGNOSIS — I119 Hypertensive heart disease without heart failure: Secondary | ICD-10-CM | POA: Diagnosis not present

## 2021-11-04 DIAGNOSIS — N3000 Acute cystitis without hematuria: Secondary | ICD-10-CM

## 2021-11-04 DIAGNOSIS — S134XXD Sprain of ligaments of cervical spine, subsequent encounter: Secondary | ICD-10-CM | POA: Diagnosis not present

## 2021-11-04 DIAGNOSIS — S06360D Traumatic hemorrhage of cerebrum, unspecified, without loss of consciousness, subsequent encounter: Secondary | ICD-10-CM | POA: Diagnosis not present

## 2021-11-04 DIAGNOSIS — R569 Unspecified convulsions: Secondary | ICD-10-CM | POA: Diagnosis not present

## 2021-11-04 DIAGNOSIS — M4802 Spinal stenosis, cervical region: Secondary | ICD-10-CM | POA: Diagnosis not present

## 2021-11-04 LAB — URINE CULTURE
MICRO NUMBER:: 13577989
SPECIMEN QUALITY:: ADEQUATE

## 2021-11-04 MED ORDER — CEPHALEXIN 500 MG PO CAPS: 500.0000 mg | ORAL_CAPSULE | Freq: Two times a day (BID) | ORAL | 0 refills | Status: AC

## 2021-11-04 NOTE — Telephone Encounter (Signed)
Attempted to reach [facility]. Left message to call back.

## 2021-11-05 ENCOUNTER — Ambulatory Visit: Payer: Medicare Other | Admitting: Family Medicine

## 2021-11-05 ENCOUNTER — Ambulatory Visit (INDEPENDENT_AMBULATORY_CARE_PROVIDER_SITE_OTHER): Payer: Medicare Other | Admitting: Family Medicine

## 2021-11-05 VITALS — BP 130/90 | HR 66 | Temp 98.0°F | Wt 147.4 lb

## 2021-11-05 DIAGNOSIS — N3000 Acute cystitis without hematuria: Secondary | ICD-10-CM

## 2021-11-05 DIAGNOSIS — E538 Deficiency of other specified B group vitamins: Secondary | ICD-10-CM | POA: Diagnosis not present

## 2021-11-05 DIAGNOSIS — I10 Essential (primary) hypertension: Secondary | ICD-10-CM

## 2021-11-05 DIAGNOSIS — E782 Mixed hyperlipidemia: Secondary | ICD-10-CM | POA: Diagnosis not present

## 2021-11-05 DIAGNOSIS — I779 Disorder of arteries and arterioles, unspecified: Secondary | ICD-10-CM | POA: Diagnosis not present

## 2021-11-05 MED ORDER — SPIRONOLACTONE 25 MG PO TABS: 25.0000 mg | ORAL_TABLET | Freq: Every day | ORAL | 3 refills | Status: DC

## 2021-11-05 NOTE — Telephone Encounter (Signed)
Updated the verbal order to [facility] with [facility]

## 2021-11-05 NOTE — Progress Notes (Signed)
Subjective:    Patient ID: Felicia Cross, female    DOB: 10-Mar-1933, 88 y.o.   MRN: [id number]  Chief Complaint  Patient presents with   Follow-up    HTN  Pt accompanied by her daughter, Marcelino Duster and [facility] aid Larita Fife.  HPI Patient was seen today for f/u.  Pt states she has been doing well.  Pt denies HAs, CP, LE edema, CP.  BP in am prior to meds 150s-160s systolic.  After meds BP typically 130s.  Taking Spironolactone 25 mg, losartan 100 mg, and Metoprolol 25 mg BID.  Per review of pt's diet, it appears pt had several salty meals which resulted in bp elevation the next 1-2 days.  Pt's daughter also realized she was cooking pork, often marinated at least 1-2 x/wk.  Pt had greens with ham hocks on Sunday.  Pt provided urine sample to clinic earlier in the wk, however was unaware that rx for keflex sent in.  Plans to pick up rx this afternoon.  Pt denies fever, chills, n/v, back pain, suprapubic pressure, freq, urgency.  Working on drinking more fluids during the day.  Pt no longer taking OTC vitamin b12.  Low to low normal b12 noted in the past.  Pt eating little to no meat some days.  States granddaughter giving her more vegetables.  Pt exercising LUE when caregivers are there.   Past Medical History:  Diagnosis Date   Anemia 01/18/2016   -reports on and off her whole life, on oral iron in the past remotely per report   Arthritis    B12 deficiency 01/18/2016   CAD (coronary artery disease)    CVD (cerebrovascular disease)    Diverticulosis of colon    H. pylori infection 10/20/2015   Hyperlipidemia    Hypertension    Osteoarthritis 03/07/2006   Qualifier: Diagnosis of  By: Cato Mulligan MD, Bruce      Allergies  Allergen Reactions   Codeine Nausea And Vomiting   Hydrocodone Nausea And Vomiting    ROS General: Denies fever, chills, night sweats, changes in weight, changes in appetite HEENT: Denies headaches, ear pain, changes in vision, rhinorrhea, sore throat CV: Denies CP,  palpitations, SOB, orthopnea Pulm: Denies SOB, cough, wheezing GI: Denies abdominal pain, nausea, vomiting, diarrhea, constipation GU: Denies hematuria, vaginal discharge  + dysuria, frequency, urgency, suprapubic pressure Msk: Denies muscle cramps, joint pains Neuro: Denies weakness, numbness, tingling Skin: Denies rashes, bruising Psych: Denies depression, anxiety, hallucinations     Objective:    Blood pressure 130/90, pulse 66, temperature 98 F (36.7 C), temperature source Oral, weight 147 lb 6.4 oz (66.9 kg), SpO2 99 %.  Gen. Pleasant, well-nourished, in no distress, normal affect   HEENT: [place]/AT, face symmetric, conjunctiva clear, no scleral icterus, PERRLA, EOMI, nares patent without drainage Lungs: no accessory muscle use, CTAB, no wheezes or rales Cardiovascular: RRR, no m/r/g, no peripheral edema Musculoskeletal: No deformities, no cyanosis or clubbing, normal tone Neuro:  A&Ox3, CN II-XII intact Skin:  Warm, no lesions/ rash  Wt Readings from Last 3 Encounters:  11/05/21 147 lb 6.4 oz (66.9 kg)  10/15/21 158 lb (71.7 kg)  07/26/21 156 lb 12 oz (71.1 kg)    Lab Results  Component Value Date   WBC 6.0 10/08/2021   HGB 11.5 (L) 10/08/2021   HCT 36.4 10/08/2021   PLT 240 10/08/2021   GLUCOSE 75 10/08/2021   CHOL 209 (H) 12/18/2020   TRIG 61.0 12/18/2020   HDL 63.70 12/18/2020   LDLDIRECT 138.1  05/19/2011   LDLCALC 133 (H) 12/18/2020   ALT 10 07/23/2021   AST 23 07/23/2021   NA 140 10/08/2021   K 3.9 10/08/2021   CL 103 10/08/2021   CREATININE 0.83 10/08/2021   BUN 10 10/08/2021   CO2 29 10/08/2021   TSH 2.04 07/05/2021   INR 1.04 09/04/2015   HGBA1C 5.9 12/18/2020    Assessment/Plan:  Essential hypertension  -Improving -Elevated in clinic -Reviewed low-sodium diet and ways to cook cholesterol -Continue current medications including losartan 100 mg daily, metoprolol tartrate 25 mg twice daily, spironolactone 25 mg - Plan: spironolactone (ALDACTONE)  25 MG tablet, Basic metabolic panel, Lipid panel,   Acute cystitis without hematuria  -UA negative however urine culture from 11/02/2021 with E. coli.  Prescription for Keflex sent to pharmacy however patient's family was unaware.  Plans to pick up Rx and start medication today. -Continue hydration -Given precautions - Plan: CBC with Differential/Platelet  Carotid artery disease, unspecified laterality, unspecified type (HCC) -Lifestyle modifications -Continue Zocor 40 mg daily - Plan: Lipid panel  Mixed hyperlipidemia  -Total cholesterol 209, LDL 133, HDL 63.7, triglycerides 61 on 12/18/2020. -Continue Zocor 40 mg daily - Plan: Lipid panel  Vitamin B12 deficiency -vitamin B12 low normal at 225 on 07/12/2021. - Plan: CBC with Differential/Platelet, Vitamin B12  F/u 2-3 months  Abbe Amsterdam, MD

## 2021-11-06 LAB — BASIC METABOLIC PANEL
BUN: 13 mg/dL (ref 7–25)
CO2: 27 mmol/L (ref 20–32)
Calcium: 9.9 mg/dL (ref 8.6–10.4)
Chloride: 96 mmol/L — ABNORMAL LOW (ref 98–110)
Creat: 0.9 mg/dL (ref 0.60–0.95)
Glucose, Bld: 71 mg/dL (ref 65–99)
Potassium: 4.4 mmol/L (ref 3.5–5.3)
Sodium: 132 mmol/L — ABNORMAL LOW (ref 135–146)

## 2021-11-06 LAB — CBC WITH DIFFERENTIAL/PLATELET
Absolute Monocytes: 420 cells/uL (ref 200–950)
Basophils Absolute: 59 cells/uL (ref 0–200)
Basophils Relative: 1.4 %
Eosinophils Absolute: 88 cells/uL (ref 15–500)
Eosinophils Relative: 2.1 %
HCT: 36.9 % (ref 35.0–45.0)
Hemoglobin: 12.1 g/dL (ref 11.7–15.5)
Lymphs Abs: 1953 cells/uL (ref 850–3900)
MCH: 30.6 pg (ref 27.0–33.0)
MCHC: 32.8 g/dL (ref 32.0–36.0)
MCV: 93.2 fL (ref 80.0–100.0)
MPV: 9.3 fL (ref 7.5–12.5)
Monocytes Relative: 10 %
Neutro Abs: 1680 cells/uL (ref 1500–7800)
Neutrophils Relative %: 40 %
Platelets: 228 10*3/uL (ref 140–400)
RBC: 3.96 10*6/uL (ref 3.80–5.10)
RDW: 13.1 % (ref 11.0–15.0)
Total Lymphocyte: 46.5 %
WBC: 4.2 10*3/uL (ref 3.8–10.8)

## 2021-11-06 LAB — LIPID PANEL
Cholesterol: 145 mg/dL (ref <200)
HDL: 63 mg/dL (ref >=50)
LDL Cholesterol (Calc): 68 mg/dL (calc)
Non-HDL Cholesterol (Calc): 82 mg/dL (calc) (ref <130)
Total CHOL/HDL Ratio: 2.3 (calc) (ref <5.0)
Triglycerides: 60 mg/dL (ref <150)

## 2021-11-06 LAB — VITAMIN B12: Vitamin B-12: 629 pg/mL (ref 200–1100)

## 2021-11-08 ENCOUNTER — Ambulatory Visit: Payer: Medicare Other | Admitting: Family Medicine

## 2021-11-11 DIAGNOSIS — S134XXD Sprain of ligaments of cervical spine, subsequent encounter: Secondary | ICD-10-CM | POA: Diagnosis not present

## 2021-11-11 DIAGNOSIS — R569 Unspecified convulsions: Secondary | ICD-10-CM | POA: Diagnosis not present

## 2021-11-11 DIAGNOSIS — M4802 Spinal stenosis, cervical region: Secondary | ICD-10-CM | POA: Diagnosis not present

## 2021-11-11 DIAGNOSIS — I119 Hypertensive heart disease without heart failure: Secondary | ICD-10-CM | POA: Diagnosis not present

## 2021-11-11 DIAGNOSIS — S06360D Traumatic hemorrhage of cerebrum, unspecified, without loss of consciousness, subsequent encounter: Secondary | ICD-10-CM | POA: Diagnosis not present

## 2021-11-11 DIAGNOSIS — I951 Orthostatic hypotension: Secondary | ICD-10-CM | POA: Diagnosis not present

## 2021-11-16 ENCOUNTER — Ambulatory Visit (INDEPENDENT_AMBULATORY_CARE_PROVIDER_SITE_OTHER): Payer: Medicare Other | Admitting: Neurology

## 2021-11-16 ENCOUNTER — Encounter: Payer: Self-pay | Admitting: Neurology

## 2021-11-16 VITALS — BP 153/88 | HR 62 | Ht 64.0 in | Wt 111.0 lb

## 2021-11-16 DIAGNOSIS — R259 Unspecified abnormal involuntary movements: Secondary | ICD-10-CM | POA: Diagnosis not present

## 2021-11-16 DIAGNOSIS — R799 Abnormal finding of blood chemistry, unspecified: Secondary | ICD-10-CM | POA: Diagnosis not present

## 2021-11-16 DIAGNOSIS — R413 Other amnesia: Secondary | ICD-10-CM

## 2021-11-16 DIAGNOSIS — R269 Unspecified abnormalities of gait and mobility: Secondary | ICD-10-CM

## 2021-11-16 MED ORDER — MEMANTINE HCL 28 X 5 MG & 21 X 10 MG PO TABS: ORAL_TABLET | ORAL | 12 refills | Status: DC

## 2021-11-16 MED ORDER — MEMANTINE HCL 10 MG PO TABS: 10.0000 mg | ORAL_TABLET | Freq: Two times a day (BID) | ORAL | 3 refills | Status: DC

## 2021-11-16 NOTE — Progress Notes (Signed)
[facility] [address] [employer]. [place] [address] [phone]       OFFICE CONSULT NOTE  Felicia Cross Date of Birth:  09-11-1932 Medical Record Number:  [id number]   Referring MD:  Dr Jerral Ralph  Reason for Referral: Involuntary movement ? seizure  HPI: Felicia Cross is a 88 year old pleasant African-American lady seen today for initial office consultation visit.  She is accompanied by 2 of her daughters as well as her caregiver.  History is obtained from them as well as review of electronic medical records and opossum reviewed pertinent available imaging films in PACS. She has past medical history of coronary artery disease, anemia, B12 deficiency, diverticulosis, hypertension, hyperlipidemia, stroke and osteoarthritis.  Patient has history of gait difficulties and poor balance and presented on 07/24/2021 to [facility] with unwitnessed fall in the kitchen.  She was found to have a large bump on the left frontal region but was awake alert and talkative and answering questions when paramedics arrived.  When evaluated in the ED she developed tightness of the left arm moving it closer to her chest and she could not let it go.  She has a known history of left elbow fracture which she was and had some chronic stiffness and shoulder limited elevation in the past but nothing like this.  She was started on Keppra presented seizures but EEG was obtained subsequently which showed no seizure activity.  MRI scan of the brain was obtained which showed tiny left occipital on intraventricular hemorrhage.  MRI scan C-spine showed mild posttraumatic ligamentous injury of C1-C2 but no fracture or spinal cord compression.  Patient since then has continued stiffness of the left upper extremity which she holds in the partially flexed position at the elbow and wrist close to her chest.  She can barely relaxes.  She is continues to have walking and balance problems.  She uses a walker but  family does not trust her and somebody has to be close to her.  Her balance is poor.  She has recently finished home physical and occupational therapy last week.  On inquiry the family admits that she may be having some short-term memory difficulties but they have not paid much attention to this and not had this evaluated.  Mini-Mental status exam today she scored 24/30 with poor recall, poor visual perception skills and copying figures.  She has no prior history of seizures, significant head injury with loss of consciousness,.  Past medical history of stroke is listed but patient or family is unable to give any specific details.  She is currently on aspirin.  She has not had any recent stroke or TIA symptoms. ROS:   14 system review of systems is positive for poor balance, increased falls, left elbow and shoulder pain with limited movements.  Freezing gait, gait apraxia.  PMH:  Past Medical History:  Diagnosis Date   Anemia 01/18/2016   -reports on and off her whole life, on oral iron in the past remotely per report   Arthritis    B12 deficiency 01/18/2016   CAD (coronary artery disease)    CVD (cerebrovascular disease)    Diverticulosis of colon    H. pylori infection 10/20/2015   Hyperlipidemia    Hypertension    Osteoarthritis 03/07/2006   Qualifier: Diagnosis of  By: Cato Mulligan MD, Bruce      Social History:  Social History   Socioeconomic History   Marital status: Married    Spouse name: Not on file  Number of children: Not on file   Years of education: Not on file   Highest education level: Not on file  Occupational History   Not on file  Tobacco Use   Smoking status: Former    Packs/day: 1.00    Years: 30.00    Total pack years: 30.00    Types: Cigarettes    Quit date: 11/01/1980    Years since quitting: 41.0   Smokeless tobacco: Never  Substance and Sexual Activity   Alcohol use: Yes    Alcohol/week: 0.0 standard drinks of alcohol    Comment: glass of wine   Drug use:  No   Sexual activity: Not on file  Other Topics Concern   Not on file  Social History Narrative   Updated 01/07/15   Work or School: retired from Geneticist, molecular, 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 [place]      Home 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, Information systems manager and two great grandchildren age 69 and 63      Spiritual Beliefs: Christian      Lifestyle: regular exercise at the [facility]; healthy diet      Social Determinants of Health   Financial Resource Strain: Low Risk  (10/29/2021)   Overall Financial Resource Strain (CARDIA)    Difficulty of Paying Living Expenses: Not hard at all  Food Insecurity: No Food Insecurity (10/29/2021)   Hunger Vital Sign    Worried About Running Out of Food in the Last Year: Never true    Ran Out of Food in the Last Year: Never true  Transportation Needs: No Transportation Needs (10/29/2021)   PRAPARE - Administrator, Civil Service (Medical): No    Lack of Transportation (Non-Medical): No  Physical Activity: Not on file  Stress: No Stress Concern Present (10/29/2021)   [employer] of Occupational Health - Occupational Stress Questionnaire    Feeling of Stress : Not at all  Social Connections: Not on file  Intimate Partner Violence: Not on file    Medications:   Current Outpatient Medications on File Prior to Visit  Medication Sig Dispense Refill   acetaminophen (TYLENOL) 325 MG tablet Take 975 mg by mouth See admin instructions. Take 3 tablets (975 mg) by mouth three times daily, may also take 2 tablets (650 mg) by mouth every 6 hours as needed for moderate pain/headache     amoxicillin (AMOXIL) 500 MG capsule Take 500 mg by mouth 2 (two) times daily.     aspirin EC 81 MG tablet Take 1 tablet (81 mg total) by mouth daily. (Patient taking differently: Take 81 mg by mouth every other day.)     Blood Pressure Monitoring (BLOOD PRESSURE CUFF) MISC Use as directed for checking bp daily. 1 each 0   levETIRAcetam (KEPPRA) 500 MG tablet Keppra 500 mg twice daily  x6 weeks, then to 250mg  BID x 1 week (Patient taking differently: 250-500 mg See admin instructions. Ordered 07/27/21: Take one tablet (500 mg) by mouth twice daily for 6 weeks, then take 250 mg twice daily for 1 week)     losartan (COZAAR) 100 MG tablet Take 1 tablet (100 mg total) by mouth daily. 90 tablet 1   metoprolol tartrate (LOPRESSOR) 25 MG tablet Take 1 tablet by mouth twice daily (Patient taking differently: Take 25 mg by mouth 2 (two) times daily.) 180 tablet 0   Multiple Vitamin-Folic Acid TABS Take 1 tablet by mouth every morning.     simvastatin (ZOCOR) 40 MG tablet Take 1 tablet (40 mg  total) by mouth daily. 46 tablet 3   spironolactone (ALDACTONE) 25 MG tablet Take 1 tablet (25 mg total) by mouth daily. 90 tablet 3   vitamin B-12 (CYANOCOBALAMIN) 1000 MCG tablet Take 1 tablet (1,000 mcg total) by mouth daily.     No current facility-administered medications on file prior to visit.    Allergies:   Allergies  Allergen Reactions   Codeine Nausea And Vomiting   Hydrocodone Nausea And Vomiting    Physical Exam General: well developed, well nourished pleasant elderly African-American lady, seated, in no evident distress Head: head normocephalic and atraumatic.   Neck: supple with no carotid or supraclavicular bruits Cardiovascular: regular rate and rhythm, no murmurs Musculoskeletal: no deformity Skin:  no rash/petichiae Vascular:  Normal pulses all extremities  Neurologic Exam Mental Status: Awake and fully alert. Oriented to place and time. Recent and remote memory diminished attention span, concentration and fund of knowledge appropriate. Mood and affect appropriate.  Mini-Mental status exam score 24/30 with deficits in attention, calculation, recall.  Unable to copy intersecting pentagons.  Clock drawing 2/4.  Geriatric depression scale 0 not depressed.  Able to name only 6 animals which can walk on 4 legs. Cranial Nerves: Fundoscopic exam reveals sharp disc margins. Pupils  equal, briskly reactive to light. Extraocular movements full without nystagmus. Visual fields full to confrontation. Hearing intact. Facial sensation intact. Face, tongue, palate moves normally and symmetrically.  Motor: Normal bulk and tone. Normal strength in all tested extremity muscles upper extremity movements restricted due to mechanical restriction of the left shoulder increased tone and old fracture of the left elbow . Sensory.: intact to touch , pinprick , position and vibratory sensation.  Coordination: Rapid alternating movements normal in all extremities. Finger-to-nose and heel-to-shin performed accurately bilaterally. Gait and Station: Arises from chair without difficulty. Stance is normal. Gait demonstrates normal stride length and balance . Able to heel, toe and tandem walk without difficulty.  Reflexes: 2+ and symmetric. Toes downgoing.        11/16/2021   10:04 AM  MMSE - Mini Mental State Exam  Orientation to time 5  Orientation to Place 5  Registration 3  Attention/ Calculation 3  Recall 0  Language- name 2 objects 2  Language- repeat 1  Language- follow 3 step command 3  Language- read & follow direction 1  Write a sentence 1  Copy design 0  Total score 24      ASSESSMENT: 88 year old lady with history of gait abnormality and frequent falls as well as cognitive impairment likely due to mild dementia.  Recent episode of admission for fall with weakness left upper extremity transient involuntary movement of unclear etiology doubt seizure.     PLAN: I had a long discussion with the patient and her 2 daughters and caregiver regarding her frequent falls and gait difficulties as well as underlying mild cognitive impairment versus early dementia and answered questions.  I am not convinced the episode she had of tonic posturing of the left upper extremity in March 2023 is necessarily a seizure.  Recommend check EEG, memory panel labs and trial of Namenda starter pack and  if tolerated 10 mg twice daily.  Discussed possible side effects with patient and daughter advised him to call me if needed.  She was advised to use a walker at all times and we also discussed fall safety precautions.  We also discussed memory compensation strategies and she was encouraged to increase participation in cognitively challenging activities like solving crossword puzzles,  playing bridge and sudoku.  She will return for follow-up in the future in 3 months or call earlier if necessary. Greater than 50% time during this 45-minute consultation visit was spent on counseling and coordination of care about her falls and cognitive decline and dementia and answering questions Felicia Heady, MD Note: This document was prepared with digital dictation and possible smart phrase technology. Any transcriptional errors that result from this process are unintentional.

## 2021-11-16 NOTE — Patient Instructions (Signed)
I had a long discussion with the patient and her 2 daughters and caregiver regarding her frequent falls and gait difficulties as well as underlying mild cognitive impairment versus early dementia and answered questions.  I am not convinced the episode she had of tonic posturing of the left upper extremity in March 2023 is necessarily a seizure.  Recommend check EEG, memory panel labs and trial of Namenda starter pack and if tolerated 10 mg twice daily.  Discussed possible side effects with patient and daughter advised him to call me if needed.  She was advised to use a walker at all times and we also discussed fall safety precautions.  We also discussed memory compensation strategies and she was encouraged to increase participation in cognitively challenging activities like solving crossword puzzles, playing bridge and sudoku.  She will return for follow-up in the future in 3 months or call earlier if necessary. Fall Prevention in the Home, Adult Falls can cause injuries and can happen to people of all ages. There are many things you can do to make your home safe and to help prevent falls. Ask for help when making these changes. What actions can I take to prevent falls? General Instructions Use good lighting in all rooms. Replace any light bulbs that burn out. Turn on the lights in dark areas. Use night-lights. Keep items that you use often in easy-to-reach places. Lower the shelves around your home if needed. Set up your furniture so you have a clear path. Avoid moving your furniture around. Do not have throw rugs or other things on the floor that can make you trip. Avoid walking on wet floors. If any of your floors are uneven, fix them. Add color or contrast paint or tape to clearly mark and help you see: Grab bars or handrails. First and last steps of staircases. Where the edge of each step is. If you use a stepladder: Make sure that it is fully opened. Do not climb a closed stepladder. Make  sure the sides of the stepladder are locked in place. Ask someone to hold the stepladder while you use it. Know where your pets are when moving through your home. What can I do in the bathroom?     Keep the floor dry. Clean up any water on the floor right away. Remove soap buildup in the tub or shower. Use nonskid mats or decals on the floor of the tub or shower. Attach bath mats securely with double-sided, nonslip rug tape. If you need to sit down in the shower, use a plastic, nonslip stool. Install grab bars by the toilet and in the tub and shower. Do not use towel bars as grab bars. What can I do in the bedroom? Make sure that you have a light by your bed that is easy to reach. Do not use any sheets or blankets for your bed that hang to the floor. Have a firm chair with side arms that you can use for support when you get dressed. What can I do in the kitchen? Clean up any spills right away. If you need to reach something above you, use a step stool with a grab bar. Keep electrical cords out of the way. Do not use floor polish or wax that makes floors slippery. What can I do with my stairs? Do not leave any items on the stairs. Make sure that you have a light switch at the top and the bottom of the stairs. Make sure that there are handrails on  both sides of the stairs. Fix handrails that are broken or loose. Install nonslip stair treads on all your stairs. Avoid having throw rugs at the top or bottom of the stairs. Choose a carpet that does not hide the edge of the steps on the stairs. Check carpeting to make sure that it is firmly attached to the stairs. Fix carpet that is loose or worn. What can I do on the outside of my home? Use bright outdoor lighting. Fix the edges of walkways and driveways and fix any cracks. Remove anything that might make you trip as you walk through a door, such as a raised step or threshold. Trim any bushes or trees on paths to your home. Check to see  if handrails are loose or broken and that both sides of all steps have handrails. Install guardrails along the edges of any raised decks and porches. Clear paths of anything that can make you trip, such as tools or rocks. Have leaves, snow, or ice cleared regularly. Use sand or salt on paths during winter. Clean up any spills in your garage right away. This includes grease or oil spills. What other actions can I take? Wear shoes that: Have a low heel. Do not wear high heels. Have rubber bottoms. Feel good on your feet and fit well. Are closed at the toe. Do not wear open-toe sandals. Use tools that help you move around if needed. These include: Canes. Walkers. Scooters. Crutches. Review your medicines with your doctor. Some medicines can make you feel dizzy. This can increase your chance of falling. Ask your doctor what else you can do to help prevent falls. Where to find more information Centers for Disease Control and Prevention, [employer]: FootballExhibition.com.br [employer] on Aging: https://walker.com/ Contact a doctor if: You are afraid of falling at home. You feel weak, drowsy, or dizzy at home. You fall at home. Summary There are many simple things that you can do to make your home safe and to help prevent falls. Ways to make your home safe include removing things that can make you trip and installing grab bars in the bathroom. Ask for help when making these changes in your home. This information is not intended to replace advice given to you by your health care provider. Make sure you discuss any questions you have with your health care provider. Document Revised: 01/25/2021 Document Reviewed: 11/27/2019 Elsevier Patient Education  2023 [employer].

## 2021-11-16 NOTE — Progress Notes (Signed)
Contacted patient. Husband-Holis(on DPR) answered. Husband preferred wife to call us back in regards to lab result.

## 2021-11-17 ENCOUNTER — Telehealth: Payer: Self-pay | Admitting: *Deleted

## 2021-11-17 DIAGNOSIS — S06360D Traumatic hemorrhage of cerebrum, unspecified, without loss of consciousness, subsequent encounter: Secondary | ICD-10-CM | POA: Diagnosis not present

## 2021-11-17 DIAGNOSIS — S134XXD Sprain of ligaments of cervical spine, subsequent encounter: Secondary | ICD-10-CM | POA: Diagnosis not present

## 2021-11-17 DIAGNOSIS — R569 Unspecified convulsions: Secondary | ICD-10-CM | POA: Diagnosis not present

## 2021-11-17 DIAGNOSIS — I951 Orthostatic hypotension: Secondary | ICD-10-CM | POA: Diagnosis not present

## 2021-11-17 DIAGNOSIS — M4802 Spinal stenosis, cervical region: Secondary | ICD-10-CM | POA: Diagnosis not present

## 2021-11-17 DIAGNOSIS — I119 Hypertensive heart disease without heart failure: Secondary | ICD-10-CM | POA: Diagnosis not present

## 2021-11-17 LAB — DEMENTIA PANEL
Homocysteine: 13.3 umol/L (ref 0.0–21.3)
RPR Ser Ql: NONREACTIVE
TSH: 2.67 u[IU]/mL (ref 0.450–4.500)
Vitamin B-12: 835 pg/mL (ref 232–1245)

## 2021-11-17 NOTE — Progress Notes (Deleted)
Cardiology Clinic Note   Patient Name: Felicia Cross Date of Encounter: 11/18/2021  Primary Care Provider:  Deeann Saint, MD Primary Cardiologist:  Olga Millers, MD  Patient Profile    Felicia Cross 88 year old female presents to the clinic today for follow-up evaluation of her coronary artery disease and essential hypertension.  Past Medical History    Past Medical History:  Diagnosis Date   Anemia 01/18/2016   -reports on and off her whole life, on oral iron in the past remotely per report   Arthritis    B12 deficiency 01/18/2016   CAD (coronary artery disease)    CVD (cerebrovascular disease)    Diverticulosis of colon    H. pylori infection 10/20/2015   Hyperlipidemia    Hypertension    Osteoarthritis 03/07/2006   Qualifier: Diagnosis of  By: Cato Mulligan MD, Bruce     Past Surgical History:  Procedure Laterality Date   ABDOMINAL HYSTERECTOMY  1984   REPLACEMENT TOTAL KNEE  2007   left   TOTAL HIP ARTHROPLASTY  2010   Dr. Turner Daniels, right   TOTAL KNEE ARTHROPLASTY Right 09/14/2015   Procedure: TOTAL KNEE ARTHROPLASTY;  Surgeon: Dannielle Huh, MD;  Location: [facility] OR;  Service: Orthopedics;  Laterality: Right;    Allergies  Allergies  Allergen Reactions   Codeine Nausea And Vomiting   Hydrocodone Nausea And Vomiting    History of Present Illness    Felicia Cross has a PMH of essential hypertension, coronary artery disease, carotid arterial disease, HLD, B12 deficiency, frequent falls, hyponatremia, and seizures.  She was seen in follow-up by Bailey Mech, DNP on 10/15/2021.  During that time she reported frequent falls related to dizziness.  She was on meclizine.  During her previous visit her HCTZ was decreased from 25 mg to 12.5 mg to allow for an elevation in her blood pressure.  It was felt that her hypotension may caused when changing positions.  Plan for repeating carotid Doppler studies was discussed if patient's symptoms did not improve.  She had  been seen in the emergency department 6-23 with dizziness and hypertension.  She had been to her PCP who had started her on spironolactone 12.5 mg during the first week of June 2023.  Her blood pressure during her visit was 169/85.  She reported that her blood pressure at home is ranging in the 170-1 190/90-120 range.  She was also instructed to start physical therapy.  However, she was found to be hypertensive 10/13/2021 with blood pressure of 180/100 and her physical therapy was not completed.  During her follow-up with Bailey Mech on 10/15/2021 she reported that she felt well.  She presented with her home health aide.  She reported that she enjoyed eating payday candy bars.  It was felt that these may be contributing to her increased blood pressure.  Her granddaughter was helping prepare meals and was conscious of not adding salt.  She reported medical compliance.  She continued to use her walker for ambulation.  She reported that she had not fallen several months.  Her spironolactone was continued at 25 mg daily along with her losartan and metoprolol twice daily.  She presents to the clinic today for follow-up evaluation states***  *** denies chest pain, shortness of breath, lower extremity edema, fatigue, palpitations, melena, hematuria, hemoptysis, diaphoresis, weakness, presyncope, syncope, orthopnea, and PND.   Essential hypertension-BP today***.  Home blood pressures***.  Previously discussed importance of permissive blood pressure to avoid hypotension and decreased fall  risk.  Lab work 11/05/2021 Continue spironolactone, losartan, metoprolol Heart healthy low-sodium diet-salty 6 given Increase physical activity as tolerated  Falls, frailty-no recent falls.  Continues to work on physical therapy and change positions slowly. Continue physical therapy exercises Balance exercises given Continue to change positions slowly Lower extremity support stockings  Hyperlipidemia-LDL*** Continue  aspirin simvastatin Heart healthy low-sodium high fiber diet Increase physical activity as tolerated  Disposition: Follow-up with Dr. Jens Som or me in 4-6 months.  Home Medications    Prior to Admission medications   Medication Sig Start Date End Date Taking? Authorizing Provider  acetaminophen (TYLENOL) 325 MG tablet Take 975 mg by mouth See admin instructions. Take 3 tablets (975 mg) by mouth three times daily, may also take 2 tablets (650 mg) by mouth every 6 hours as needed for moderate pain/headache    [provider]  amoxicillin (AMOXIL) 500 MG capsule Take 500 mg by mouth 2 (two) times daily. 10/06/21   [provider]  aspirin EC 81 MG tablet Take 1 tablet (81 mg total) by mouth daily. Patient taking differently: Take 81 mg by mouth every other day. 08/18/16   Terressa Koyanagi, DO  Blood Pressure Monitoring (BLOOD PRESSURE CUFF) MISC Use as directed for checking bp daily. 09/29/21   Deeann Saint, MD  levETIRAcetam (KEPPRA) 500 MG tablet Keppra 500 mg twice daily x6 weeks, then to 250mg  BID x 1 week Patient taking differently: 250-500 mg See admin instructions. Ordered 07/27/21: Take one tablet (500 mg) by mouth twice daily for 6 weeks, then take 250 mg twice daily for 1 week 07/27/21   07/29/21, MD  losartan (COZAAR) 100 MG tablet Take 1 tablet (100 mg total) by mouth daily. 10/18/21   12/18/21, MD  memantine [facility] TITRATION PAK) tablet pack 5 mg/day for =1 week; 5 mg twice daily for =1 week; 15 mg/day given in 5 mg and 10 mg separated doses for =1 week; then 10 mg twice daily 11/16/21   01/17/22, MD  memantine (NAMENDA) 10 MG tablet Take 1 tablet (10 mg total) by mouth 2 (two) times daily. 11/16/21   01/17/22, MD  metoprolol tartrate (LOPRESSOR) 25 MG tablet Take 1 tablet by mouth twice daily Patient taking differently: Take 25 mg by mouth 2 (two) times daily. 04/28/21   04/30/21, MD  Multiple Vitamin-Folic Acid TABS Take 1  tablet by mouth every morning.    [provider]  simvastatin (ZOCOR) 40 MG tablet Take 1 tablet (40 mg total) by mouth daily. 10/05/21   10/07/21, MD  spironolactone (ALDACTONE) 25 MG tablet Take 1 tablet (25 mg total) by mouth daily. 11/05/21   11/07/21, MD  vitamin B-12 (CYANOCOBALAMIN) 1000 MCG tablet Take 1 tablet (1,000 mcg total) by mouth daily. 07/27/21   Ghimire, 07/29/21, MD    Family History    Family History  Problem Relation Age of Onset   Diabetes Mother    Hypertension Father    Kidney disease Father    Heart attack Sister    Heart attack Brother    Colon cancer Neg Hx    Stomach cancer Neg Hx    Esophageal cancer Neg Hx    Rectal cancer Neg Hx    She indicated that her mother is deceased. She indicated that her father is deceased. She indicated that the status of her sister is unknown. She indicated that the status of her brother is  unknown. She indicated that the status of her neg hx is unknown.  Social History    Social History   Socioeconomic History   Marital status: Married    Spouse name: Not on file   Number of children: Not on file   Years of education: Not on file   Highest education level: Not on file  Occupational History   Not on file  Tobacco Use   Smoking status: Former    Packs/day: 1.00    Years: 30.00    Total pack years: 30.00    Types: Cigarettes    Quit date: 11/01/1980    Years since quitting: 41.0   Smokeless tobacco: Never  Substance and Sexual Activity   Alcohol use: Yes    Alcohol/week: 0.0 standard drinks of alcohol    Comment: glass of wine   Drug use: No   Sexual activity: Not on file  Other Topics Concern   Not on file  Social History Narrative   Updated 01/07/15   Work or School: retired from Geneticist, molecular, 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 [place]      Home 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, Information systems manager and two great grandchildren age 13 and 51      Spiritual Beliefs: Christian      Lifestyle: regular  exercise at the [facility]; healthy diet      Social Determinants of Health   Financial Resource Strain: Low Risk  (10/29/2021)   Overall Financial Resource Strain (CARDIA)    Difficulty of Paying Living Expenses: Not hard at all  Food Insecurity: No Food Insecurity (10/29/2021)   Hunger Vital Sign    Worried About Running Out of Food in the Last Year: Never true    Ran Out of Food in the Last Year: Never true  Transportation Needs: No Transportation Needs (10/29/2021)   PRAPARE - Administrator, Civil Service (Medical): No    Lack of Transportation (Non-Medical): No  Physical Activity: Not on file  Stress: No Stress Concern Present (10/29/2021)   [employer] of Occupational Health - Occupational Stress Questionnaire    Feeling of Stress : Not at all  Social Connections: Not on file  Intimate Partner Violence: Not on file     Review of Systems    General:  No chills, fever, night sweats or weight changes.  Cardiovascular:  No chest pain, dyspnea on exertion, edema, orthopnea, palpitations, paroxysmal nocturnal dyspnea. Dermatological: No rash, lesions/masses Respiratory: No cough, dyspnea Urologic: No hematuria, dysuria Abdominal:   No nausea, vomiting, diarrhea, bright red blood per rectum, melena, or hematemesis Neurologic:  No visual changes, wkns, changes in mental status. All other systems reviewed and are otherwise negative except as noted above.  Physical Exam    VS:  There were no vitals taken for this visit. , BMI There is no height or weight on file to calculate BMI. GEN: Well nourished, well developed, in no acute distress. HEENT: normal. Neck: Supple, no JVD, carotid bruits, or masses. Cardiac: RRR, no murmurs, rubs, or gallops. No clubbing, cyanosis, edema.  Radials/DP/PT 2+ and equal bilaterally.  Respiratory:  Respirations regular and unlabored, clear to auscultation bilaterally. GI: Soft, nontender, nondistended, BS + x 4. MS: no deformity or  atrophy. Skin: warm and dry, no rash. Neuro:  Strength and sensation are intact. Psych: Normal affect.  Accessory Clinical Findings    Recent Labs: 07/23/2021: ALT 10 07/24/2021: Magnesium 1.8 11/05/2021: BUN 13; Creat 0.90; Hemoglobin 12.1; Platelets 228; Potassium 4.4; Sodium 132 11/16/2021: TSH 2.670  Recent Lipid Panel    Component Value Date/Time   CHOL 145 11/05/2021 1513   CHOL 180 11/05/2018 1233   TRIG 60 11/05/2021 1513   HDL 63 11/05/2021 1513   HDL 72 11/05/2018 1233   CHOLHDL 2.3 11/05/2021 1513   VLDL 12.2 12/18/2020 1042   LDLCALC 68 11/05/2021 1513   LDLDIRECT 138.1 05/19/2011 1055    ECG personally reviewed by me today- *** - No acute changes  Echocardiogram 07/24/2021  1. Left ventricular ejection fraction, by estimation, is 60 to 65%. The  left ventricle has normal function. The left ventricle has no regional  wall motion abnormalities. There is moderate left ventricular hypertrophy  of the basal segment. Left  ventricular diastolic parameters are consistent with Grade I diastolic  dysfunction (impaired relaxation).   2. Right ventricular systolic function is normal. The right ventricular  size is normal. There is mildly elevated pulmonary artery systolic  pressure.   3. No evidence of mitral valve regurgitation.   4. Aortic valve regurgitation is not visualized. Aortic valve  sclerosis/calcification is present, without any evidence of aortic  stenosis.   5. The inferior vena cava is dilated in size with >50% respiratory  variability, suggesting right atrial pressure of 8 mmHg.   Assessment & Plan   1.  ***   Thomasene Ripple. Mazella Deen NP-C     11/18/2021, 10:59 AM [facility] [address] Office [phone] Fax [phone]  Notice: This dictation was prepared with Dragon dictation along with smaller phrase technology. Any transcriptional errors that result from this process are unintentional and may not be  corrected upon review.  I spent***minutes examining this patient, reviewing medications, and using patient centered shared decision making involving her cardiac care.  Prior to her visit I spent greater than 20 minutes reviewing her past medical history,  medications, and prior cardiac tests.

## 2021-11-17 NOTE — Telephone Encounter (Signed)
-----   Message from Micki Riley, MD sent at 11/17/2021  8:48 AM EDT ----- Joneen Roach inform the patient that reversible causes of memory loss lab work was all satisfactory.  1 lab test is not back yet.

## 2021-11-17 NOTE — Progress Notes (Signed)
Kindly inform the patient that reversible causes of memory loss lab work was all satisfactory.  1 lab test is not back yet.

## 2021-11-17 NOTE — Telephone Encounter (Signed)
I have spoken to patient and provided with the information below.

## 2021-11-17 NOTE — Progress Notes (Signed)
Kindly inform patient that all labwork for reversible causes of memory losss was satisfactory

## 2021-11-18 ENCOUNTER — Telehealth: Payer: Self-pay

## 2021-11-18 NOTE — Telephone Encounter (Signed)
-----   Message from Micki Riley, MD sent at 11/17/2021  8:02 PM EDT ----- Joneen Roach inform patient that all labwork for reversible causes of memory losss was satisfactory

## 2021-11-18 NOTE — Telephone Encounter (Signed)
Pt verified by name and DOB,  normal results given per provider, pt voiced understanding all question answered. °

## 2021-11-19 ENCOUNTER — Ambulatory Visit: Payer: Medicare Other | Admitting: General Practice

## 2021-11-19 ENCOUNTER — Encounter: Payer: Self-pay | Admitting: Family Medicine

## 2021-11-19 ENCOUNTER — Ambulatory Visit: Payer: Medicare Other | Admitting: Adult Health

## 2021-11-24 DIAGNOSIS — I951 Orthostatic hypotension: Secondary | ICD-10-CM | POA: Diagnosis not present

## 2021-11-24 DIAGNOSIS — M4802 Spinal stenosis, cervical region: Secondary | ICD-10-CM | POA: Diagnosis not present

## 2021-11-24 DIAGNOSIS — S06360D Traumatic hemorrhage of cerebrum, unspecified, without loss of consciousness, subsequent encounter: Secondary | ICD-10-CM | POA: Diagnosis not present

## 2021-11-24 DIAGNOSIS — R569 Unspecified convulsions: Secondary | ICD-10-CM | POA: Diagnosis not present

## 2021-11-24 DIAGNOSIS — I119 Hypertensive heart disease without heart failure: Secondary | ICD-10-CM | POA: Diagnosis not present

## 2021-11-24 DIAGNOSIS — S134XXD Sprain of ligaments of cervical spine, subsequent encounter: Secondary | ICD-10-CM | POA: Diagnosis not present

## 2021-11-25 ENCOUNTER — Other Ambulatory Visit: Payer: Self-pay

## 2021-11-25 DIAGNOSIS — E871 Hypo-osmolality and hyponatremia: Secondary | ICD-10-CM

## 2021-11-29 ENCOUNTER — Other Ambulatory Visit: Payer: Medicare Other

## 2021-11-30 ENCOUNTER — Ambulatory Visit (INDEPENDENT_AMBULATORY_CARE_PROVIDER_SITE_OTHER): Payer: Medicare Other | Admitting: Neurology

## 2021-11-30 DIAGNOSIS — R4182 Altered mental status, unspecified: Secondary | ICD-10-CM

## 2021-11-30 DIAGNOSIS — R413 Other amnesia: Secondary | ICD-10-CM

## 2021-12-01 ENCOUNTER — Telehealth: Payer: Self-pay | Admitting: *Deleted

## 2021-12-01 ENCOUNTER — Telehealth: Payer: Self-pay | Admitting: Family Medicine

## 2021-12-01 ENCOUNTER — Other Ambulatory Visit: Payer: Self-pay | Admitting: Family Medicine

## 2021-12-01 DIAGNOSIS — E871 Hypo-osmolality and hyponatremia: Secondary | ICD-10-CM | POA: Diagnosis not present

## 2021-12-01 NOTE — Telephone Encounter (Signed)
The order has faxed. Contacted patient, husband answered (on DPR). Inform him the order sent

## 2021-12-01 NOTE — Telephone Encounter (Signed)
I called the pt and and relayed results. She verbalized understanding and appreciation for the call.

## 2021-12-01 NOTE — Telephone Encounter (Signed)
-----   Message from Micki Riley, MD sent at 12/01/2021  8:33 AM EDT ----- Joneen Roach inform the patient that her EEG or brainwave study shows mild slowing of brain activity which may be age-related.  No seizure activity or worrisome finding noted.

## 2021-12-01 NOTE — Progress Notes (Signed)
Kindly inform the patient that her EEG or brainwave study shows mild slowing of brain activity which may be age-related.  No seizure activity or worrisome finding noted.

## 2021-12-01 NOTE — Telephone Encounter (Signed)
Pt is with her daughter right now at [facility] on [address] - [address] - Appt is today for 1:45pm  Please fax the order to have blood drawn  Fax [phone]

## 2021-12-02 LAB — BASIC METABOLIC PANEL
BUN/Creatinine Ratio: 13 (ref 12–28)
BUN: 12 mg/dL (ref 8–27)
CO2: 22 mmol/L (ref 20–29)
Calcium: 9.4 mg/dL (ref 8.7–10.3)
Chloride: 94 mmol/L — ABNORMAL LOW (ref 96–106)
Creatinine, Ser: 0.89 mg/dL (ref 0.57–1.00)
Glucose: 89 mg/dL (ref 70–99)
Potassium: 5.2 mmol/L (ref 3.5–5.2)
Sodium: 129 mmol/L — ABNORMAL LOW (ref 134–144)
eGFR: 63 mL/min/{1.73_m2} (ref >59)

## 2021-12-02 LAB — SPECIMEN STATUS REPORT

## 2021-12-13 ENCOUNTER — Ambulatory Visit (INDEPENDENT_AMBULATORY_CARE_PROVIDER_SITE_OTHER): Payer: Medicare Other

## 2021-12-13 VITALS — Ht 60.25 in | Wt 150.0 lb

## 2021-12-13 DIAGNOSIS — Z0000 Encounter for general adult medical examination without abnormal findings: Secondary | ICD-10-CM

## 2021-12-13 NOTE — Progress Notes (Signed)
I connected with Felicia Cross today by telephone and verified that I am speaking with the correct person using two identifiers. Location patient: home Location provider: work Persons participating in the virtual visit: Felicia Nick LPN.   I discussed the limitations, risks, security and privacy concerns of performing an evaluation and management service by telephone and the availability of in person appointments. I also discussed with the patient that there may be a patient responsible charge related to this service. The patient expressed understanding and verbally consented to this telephonic visit.    Interactive audio and video telecommunications were attempted between this provider and patient, however failed, due to patient having technical difficulties OR patient did not have access to video capability.  We continued and completed visit with audio only.     Vital signs may be patient reported or missing.  Subjective:   Felicia Cross is a 88 y.o. female who presents for Medicare Annual (Subsequent) preventive examination.  Review of Systems     Cardiac Risk Factors include: advanced age (>56men, >73 women);dyslipidemia;hypertension     Objective:    Today's Vitals   12/13/21 1113  Weight: 150 lb (68 kg)  Height: 5' 0.25" (1.53 m)   Body mass index is 29.05 kg/m.     12/13/2021   11:20 AM 10/08/2021   11:42 AM 08/01/2021   11:45 AM 07/24/2021    9:06 PM 07/24/2021    1:52 AM 07/19/2021   10:54 AM 12/06/2020    3:53 PM  Advanced Directives  Does Patient Have a Medical Advance Directive? Yes No No  Yes Yes Yes  Type of Estate agent of [place];Living will   Healthcare Power of Attorney   Living will  Does patient want to make changes to medical advance directive?    No - Patient declined No - Patient declined No - Patient declined   Copy of Healthcare Power of Attorney in Chart? No - copy requested          Current Medications  (verified) Outpatient Encounter Medications as of 12/13/2021  Medication Sig   acetaminophen (TYLENOL) 325 MG tablet Take 975 mg by mouth See admin instructions. Take 3 tablets (975 mg) by mouth three times daily, may also take 2 tablets (650 mg) by mouth every 6 hours as needed for moderate pain/headache   aspirin EC 81 MG tablet Take 1 tablet (81 mg total) by mouth daily. (Patient taking differently: Take 81 mg by mouth every other day.)   levETIRAcetam (KEPPRA) 500 MG tablet Keppra 500 mg twice daily x6 weeks, then to 250mg  BID x 1 week (Patient taking differently: 250-500 mg See admin instructions. Ordered 07/27/21: Take one tablet (500 mg) by mouth twice daily for 6 weeks, then take 250 mg twice daily for 1 week)   losartan (COZAAR) 100 MG tablet Take 1 tablet (100 mg total) by mouth daily.   memantine (NAMENDA) 10 MG tablet Take 1 tablet (10 mg total) by mouth 2 (two) times daily.   metoprolol tartrate (LOPRESSOR) 25 MG tablet Take 1 tablet by mouth twice daily (Patient taking differently: Take 25 mg by mouth 2 (two) times daily.)   Multiple Vitamin-Folic Acid TABS Take 1 tablet by mouth every morning.   simvastatin (ZOCOR) 40 MG tablet Take 1 tablet (40 mg total) by mouth daily.   spironolactone (ALDACTONE) 25 MG tablet Take 1 tablet (25 mg total) by mouth daily.   vitamin B-12 (CYANOCOBALAMIN) 1000 MCG tablet Take 1 tablet (1,000  mcg total) by mouth daily.   amoxicillin (AMOXIL) 500 MG capsule Take 500 mg by mouth 2 (two) times daily. (Patient not taking: Reported on 12/13/2021)   Blood Pressure Monitoring (BLOOD PRESSURE CUFF) MISC Use as directed for checking bp daily.   memantine (NAMENDA TITRATION PAK) tablet pack 5 mg/day for =1 week; 5 mg twice daily for =1 week; 15 mg/day given in 5 mg and 10 mg separated doses for =1 week; then 10 mg twice daily   No facility-administered encounter medications on file as of 12/13/2021.    Allergies (verified) Codeine and Hydrocodone   History: Past  Medical History:  Diagnosis Date   Anemia 01/18/2016   -reports on and off her whole life, on oral iron in the past remotely per report   Arthritis    B12 deficiency 01/18/2016   CAD (coronary artery disease)    CVD (cerebrovascular disease)    Diverticulosis of colon    H. pylori infection 10/20/2015   Hyperlipidemia    Hypertension    Osteoarthritis 03/07/2006   Qualifier: Diagnosis of  By: Cato Mulligan MD, Bruce     Past Surgical History:  Procedure Laterality Date   ABDOMINAL HYSTERECTOMY  1984   REPLACEMENT TOTAL KNEE  2007   left   TOTAL HIP ARTHROPLASTY  2010   Dr. Turner Daniels, right   TOTAL KNEE ARTHROPLASTY Right 09/14/2015   Procedure: TOTAL KNEE ARTHROPLASTY;  Surgeon: Dannielle Huh, MD;  Location: [facility] OR;  Service: Orthopedics;  Laterality: Right;   Family History  Problem Relation Age of Onset   Diabetes Mother    Hypertension Father    Kidney disease Father    Heart attack Sister    Heart attack Brother    Colon cancer Neg Hx    Stomach cancer Neg Hx    Esophageal cancer Neg Hx    Rectal cancer Neg Hx    Social History   Socioeconomic History   Marital status: Married    Spouse name: Not on file   Number of children: Not on file   Years of education: Not on file   Highest education level: Not on file  Occupational History   Not on file  Tobacco Use   Smoking status: Former    Packs/day: 1.00    Years: 30.00    Total pack years: 30.00    Types: Cigarettes    Quit date: 11/01/1980    Years since quitting: 41.1   Smokeless tobacco: Never  Vaping Use   Vaping Use: Never used  Substance and Sexual Activity   Alcohol use: Not Currently    Comment: glass of wine   Drug use: No   Sexual activity: Not on file  Other Topics Concern   Not on file  Social History Narrative   Updated 01/07/15   Work or School: retired from Geneticist, molecular, 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 [place]      Home 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, Information systems manager and two great grandchildren age 51 and 24       Spiritual Beliefs: Christian      Lifestyle: regular [facility] at [facility]; healthy diet      Social Determinants of Health   Financial Resource Strain: Low Risk  (12/13/2021)   Overall Financial Resource Strain (CARDIA)    Difficulty of Paying Living Expenses: Not hard at all  Food Insecurity: No Food Insecurity (12/13/2021)   Hunger Vital Sign    Worried About Running Out of Food in the Last Year: Never true  Ran Out of Food in the Last Year: Never true  Transportation Needs: No Transportation Needs (12/13/2021)   PRAPARE - Administrator, Civil Service (Medical): No    Lack of Transportation (Non-Medical): No  Physical Activity: Inactive (12/13/2021)   Exercise Vital Sign    Days of Exercise per Week: 0 days    Minutes of Exercise per Session: 0 min  Stress: No Stress Concern Present (12/13/2021)   [employer] of Occupational Health - Occupational Stress Questionnaire    Feeling of Stress : Not at all  Social Connections: Not on file    Tobacco Counseling Counseling given: Not Answered   Clinical Intake:  Pre-visit preparation completed: Yes  Pain : No/denies pain     Nutritional Status: BMI 25 -29 Overweight Nutritional Risks: None Diabetes: No  How often do you need to have someone help you when you read instructions, pamphlets, or other written materials from your doctor or pharmacy?: 1 - Never What is the last grade level you completed in school?: 68yrs college  Diabetic? no  Interpreter Needed?: No  Information entered by :: NAllen LPN   Activities of Daily Living    12/13/2021   11:22 AM 07/24/2021    1:52 AM  In your present state of health, do you have any difficulty performing the following activities:  Hearing? 0 0  Vision? 0 0  Difficulty concentrating or making decisions? 0 0  Walking or climbing stairs? 1 1  Dressing or bathing? 1 0  Doing errands, shopping? 1 1  Comment does not drive   Preparing Food and eating ? Y   Using the  Toilet? N   In the past six months, have you accidently leaked urine? Y   Do you have problems with loss of bowel control? N   Managing your Medications? Y   Comment daughter sets up   Managing your Finances? Y   Comment husband Civil engineer, contracting? Y     Patient Care Team: Deeann Saint, MD as PCP - General (Family Medicine) Jens Som Madolyn Frieze, MD as PCP - Cardiology (Cardiology) Kirkland Hun, MD as Consulting Physician (Obstetrics and Gynecology) Lewayne Bunting, MD as Consulting Physician (Cardiology) Dannielle Huh, MD as Consulting Physician (Orthopedic Surgery)  Indicate any recent Medical Services you may have received from other than [facility] providers in the past year (date may be approximate).     Assessment:   This is a routine wellness examination for [place].  Hearing/Vision screen Vision Screening - Comments:: Regular eye exams, [facility]  Dietary issues and exercise activities discussed: Current Exercise Habits: The patient does not participate in regular exercise at present   Goals Addressed             This Visit's Progress    Patient Stated       12/13/2021, wants to get left hand moving       Depression Screen    12/13/2021   11:22 AM 10/29/2021    1:36 PM 09/29/2021    3:59 PM 07/05/2021   10:12 AM 03/31/2021    2:19 PM 12/01/2020   11:09 AM 10/21/2019   11:06 AM  PHQ 2/9 Scores  PHQ - 2 Score 0 1 3 0 0 0 0  PHQ- 9 Score   3 0 0      Fall Risk    12/13/2021   11:21 AM 10/29/2021    1:35 PM 09/29/2021    3:50  PM 07/05/2021   10:12 AM 03/31/2021    2:18 PM  Fall Risk   Falls in the past year? 1  1 1 1   Comment had a seizure      Number falls in past yr: 0 1 1 1 1   Injury with Fall? 1  1 1 1   Risk for fall due to : History of fall(s);Impaired mobility;Impaired balance/gait;Medication side effect History of fall(s);Impaired balance/gait;Impaired mobility Other (Comment)    Follow up Falls evaluation  completed;Education provided;Falls prevention discussed Education provided;Falls prevention discussed Falls evaluation completed    Comment  currently getting PT       FALL RISK PREVENTION PERTAINING TO THE HOME:  Any stairs in or around the home? No  If so, are there any without handrails? N/a Home free of loose throw rugs in walkways, pet beds, electrical cords, etc? Yes  Adequate lighting in your home to reduce risk of falls? Yes   ASSISTIVE DEVICES UTILIZED TO PREVENT FALLS:  Life alert? No  Use of a cane, walker or w/c? Yes  Grab bars in the bathroom? Yes  Shower chair or bench in shower? Yes  Elevated toilet seat or a handicapped toilet? Yes   TIMED UP AND GO:  Was the test performed? No .      Cognitive Function:    11/16/2021   10:04 AM  MMSE - Mini Mental State Exam  Orientation to time 5  Orientation to Place 5  Registration 3  Attention/ Calculation 3  Recall 0  Language- name 2 objects 2  Language- repeat 1  Language- follow 3 step command 3  Language- read & follow direction 1  Write a sentence 1  Copy design 0  Total score 24        12/13/2021   11:24 AM 12/01/2020   11:12 AM  6CIT Screen  What Year? 0 points 0 points  What month? 0 points 0 points  What time? 0 points 0 points  Count back from 20 0 points 0 points  Months in reverse 0 points 0 points  Repeat phrase 0 points 2 points  Total Score 0 points 2 points    Immunizations Immunization History  Administered Date(s) Administered   Fluad Quad(high Dose 65+) 01/21/2021   Influenza Whole 02/07/2011   Influenza, High Dose Seasonal PF 01/07/2015, 01/23/2015, 01/18/2016, 04/10/2017, 01/25/2018, 02/11/2019   PFIZER(Purple Top)SARS-COV-2 Vaccination 06/17/2019, 07/11/2019   Pneumococcal Conjugate-13 04/30/2014   Pneumococcal Polysaccharide-23 05/09/2002, 02/10/2019   Td 05/09/2004, 05/09/2008, 05/24/2018   Zoster, Live 04/10/2015    TDAP status: Up to date  Flu Vaccine status: Due,  Education has been provided regarding the importance of this vaccine. Advised may receive this vaccine at local pharmacy or Health Dept. Aware to provide a copy of the vaccination record if obtained from local pharmacy or Health Dept. Verbalized acceptance and understanding.  Pneumococcal vaccine status: Up to date  Covid-19 vaccine status: Completed vaccines  Qualifies for Shingles Vaccine? Yes   Zostavax completed Yes   Shingrix Completed?: No.    Education has been provided regarding the importance of this vaccine. Patient has been advised to call insurance company to determine out of pocket expense if they have not yet received this vaccine. Advised may also receive vaccine at local pharmacy or Health Dept. Verbalized acceptance and understanding.  Screening Tests Health Maintenance  Topic Date Due   Zoster Vaccines- Shingrix (1 of 2) Never done   COVID-19 Vaccine (3 - Pfizer series) 09/05/2019   INFLUENZA  VACCINE  12/07/2021   TETANUS/TDAP  05/24/2028   Pneumonia Vaccine 4665+ Years old  Completed   DEXA SCAN  Addressed   HPV VACCINES  Aged Out    Health Maintenance  Health Maintenance Due  Topic Date Due   Zoster Vaccines- Shingrix (1 of 2) Never done   COVID-19 Vaccine (3 - Pfizer series) 09/05/2019   INFLUENZA VACCINE  12/07/2021    Colorectal cancer screening: No longer required.   Mammogram status: No longer required due to age.  Bone Density status: Completed 04/10/2015.   Lung Cancer Screening: (Low Dose CT Chest recommended if Age 8-80 years, 30 pack-year currently smoking OR have quit w/in 15years.) does not qualify.   Lung Cancer Screening Referral: no  Additional Screening:  Hepatitis C Screening: does not qualify;  Vision Screening: Recommended annual ophthalmology exams for early detection of glaucoma and other disorders of the eye. Is the patient up to date with their annual eye exam?  Yes  Who is the provider or what is the name of the office in which  the patient attends annual eye exams? [facility] If pt is not established with a provider, would they like to be referred to a provider to establish care? No .   Dental Screening: Recommended annual dental exams for proper oral hygiene  Community Resource Referral / Chronic Care Management: CRR required this visit?  No   CCM required this visit?  No      Plan:     I have personally reviewed and noted the following in the patient's chart:   Medical and social history Use of alcohol, tobacco or illicit drugs  Current medications and supplements including opioid prescriptions.  Functional ability and status Nutritional status Physical activity Advanced directives List of other physicians Hospitalizations, surgeries, and ER visits in previous 12 months Vitals Screenings to include cognitive, depression, and falls Referrals and appointments  In addition, I have reviewed and discussed with patient certain preventive protocols, quality metrics, and best practice recommendations. A written personalized care plan for preventive services as well as general preventive health recommendations were provided to patient.     Barb Merinoickeah E Venson Ferencz, LPN   1/6/10968/11/2021   Nurse Notes: states left hand still giving her trouble. Appointment made with Dr. Salomon FickBanks.  Due to this being a virtual visit, the after visit summary with patients personalized plan was offered to patient via mail or my-chart.  to pick up at office at next visit

## 2021-12-13 NOTE — Patient Instructions (Signed)
Felicia Cross , Thank you for taking time to come for your Medicare Wellness Visit. I appreciate your ongoing commitment to your health goals. Please review the following plan we discussed and let me know if I can assist you in the future.   Screening recommendations/referrals: Colonoscopy: not required Mammogram: not required Bone Density: completed 04/10/2015 Recommended yearly ophthalmology/optometry visit for glaucoma screening and checkup Recommended yearly dental visit for hygiene and checkup  Vaccinations: Influenza vaccine: due Pneumococcal vaccine: completed 02/10/2019 Tdap vaccine: completed 05/24/2018, due 05/24/2028 Shingles vaccine: discussed   Covid-19: 07/11/2019, 06/17/2019  Advanced directives: Please bring a copy of your POA (Power of Attorney) and/or Living Will to your next appointment.   Conditions/risks identified: none  Next appointment: Follow up in one year for your annual wellness visit    Preventive Care 65 Years and Older, Female Preventive care refers to lifestyle choices and visits with your health care provider that can promote health and wellness. What does preventive care include? A yearly physical exam. This is also called an annual well check. Dental exams once or twice a year. Routine eye exams. Ask your health care provider how often you should have your eyes checked. Personal lifestyle choices, including: Daily care of your teeth and gums. Regular physical activity. Eating a healthy diet. Avoiding tobacco and drug use. Limiting alcohol use. Practicing safe sex. Taking low-dose aspirin every day. Taking vitamin and mineral supplements as recommended by your health care provider. What happens during an annual well check? The services and screenings done by your health care provider during your annual well check will depend on your age, overall health, lifestyle risk factors, and family history of disease. Counseling  Your health care provider may ask  you questions about your: Alcohol use. Tobacco use. Drug use. Emotional well-being. Home and relationship well-being. Sexual activity. Eating habits. History of falls. Memory and ability to understand (cognition). Work and work Astronomer. Reproductive health. Screening  You may have the following tests or measurements: Height, weight, and BMI. Blood pressure. Lipid and cholesterol levels. These may be checked every 5 years, or more frequently if you are over 37 years old. Skin check. Lung cancer screening. You may have this screening every year starting at age 86 if you have a 30-pack-year history of smoking and currently smoke or have quit within the past 15 years. Fecal occult blood test (FOBT) of the stool. You may have this test every year starting at age 86. Flexible sigmoidoscopy or colonoscopy. You may have a sigmoidoscopy every 5 years or a colonoscopy every 10 years starting at age 86. Hepatitis C blood test. Hepatitis B blood test. Sexually transmitted disease (STD) testing. Diabetes screening. This is done by checking your blood sugar (glucose) after you have not eaten for a while (fasting). You may have this done every 1-3 years. Bone density scan. This is done to screen for osteoporosis. You may have this done starting at age 86. Mammogram. This may be done every 1-2 years. Talk to your health care provider about how often you should have regular mammograms. Talk with your health care provider about your test results, treatment options, and if necessary, the need for more tests. Vaccines  Your health care provider may recommend certain vaccines, such as: Influenza vaccine. This is recommended every year. Tetanus, diphtheria, and acellular pertussis (Tdap, Td) vaccine. You may need a Td booster every 10 years. Zoster vaccine. You may need this after age 75. Pneumococcal 13-valent conjugate (PCV13) vaccine. One dose is recommended  after age 86. Pneumococcal  polysaccharide (PPSV23) vaccine. One dose is recommended after age 86. Talk to your health care provider about which screenings and vaccines you need and how often you need them. This information is not intended to replace advice given to you by your health care provider. Make sure you discuss any questions you have with your health care provider. Document Released: 05/22/2015 Document Revised: 01/13/2016 Document Reviewed: 02/24/2015 Elsevier Interactive Patient Education  2017 [place] Prevention in the Home Falls can cause injuries. They can happen to people of all ages. There are many things you can do to make your home safe and to help prevent falls. What can I do on the outside of my home? Regularly fix the edges of walkways and driveways and fix any cracks. Remove anything that might make you trip as you walk through a door, such as a raised step or threshold. Trim any bushes or trees on the path to your home. Use bright outdoor lighting. Clear any walking paths of anything that might make someone trip, such as rocks or tools. Regularly check to see if handrails are loose or broken. Make sure that both sides of any steps have handrails. Any raised decks and porches should have guardrails on the edges. Have any leaves, snow, or ice cleared regularly. Use sand or salt on walking paths during winter. Clean up any spills in your garage right away. This includes oil or grease spills. What can I do in the bathroom? Use night lights. Install grab bars by the toilet and in the tub and shower. Do not use towel bars as grab bars. Use non-skid mats or decals in the tub or shower. If you need to sit down in the shower, use a plastic, non-slip stool. Keep the floor dry. Clean up any water that spills on the floor as soon as it happens. Remove soap buildup in the tub or shower regularly. Attach bath mats securely with double-sided non-slip rug tape. Do not have throw rugs and other  things on the floor that can make you trip. What can I do in the bedroom? Use night lights. Make sure that you have a light by your bed that is easy to reach. Do not use any sheets or blankets that are too big for your bed. They should not hang down onto the floor. Have a firm chair that has side arms. You can use this for support while you get dressed. Do not have throw rugs and other things on the floor that can make you trip. What can I do in the kitchen? Clean up any spills right away. Avoid walking on wet floors. Keep items that you use a lot in easy-to-reach places. If you need to reach something above you, use a strong step stool that has a grab bar. Keep electrical cords out of the way. Do not use floor polish or wax that makes floors slippery. If you must use wax, use non-skid floor wax. Do not have throw rugs and other things on the floor that can make you trip. What can I do with my stairs? Do not leave any items on the stairs. Make sure that there are handrails on both sides of the stairs and use them. Fix handrails that are broken or loose. Make sure that handrails are as long as the stairways. Check any carpeting to make sure that it is firmly attached to the stairs. Fix any carpet that is loose or worn. Avoid having throw  rugs at the top or bottom of the stairs. If you do have throw rugs, attach them to the floor with carpet tape. Make sure that you have a light switch at the top of the stairs and the bottom of the stairs. If you do not have them, ask someone to add them for you. What else can I do to help prevent falls? Wear shoes that: Do not have high heels. Have rubber bottoms. Are comfortable and fit you well. Are closed at the toe. Do not wear sandals. If you use a stepladder: Make sure that it is fully opened. Do not climb a closed stepladder. Make sure that both sides of the stepladder are locked into place. Ask someone to hold it for you, if possible. Clearly  mark and make sure that you can see: Any grab bars or handrails. First and last steps. Where the edge of each step is. Use tools that help you move around (mobility aids) if they are needed. These include: Canes. Walkers. Scooters. Crutches. Turn on the lights when you go into a dark area. Replace any light bulbs as soon as they burn out. Set up your furniture so you have a clear path. Avoid moving your furniture around. If any of your floors are uneven, fix them. If there are any pets around you, be aware of where they are. Review your medicines with your doctor. Some medicines can make you feel dizzy. This can increase your chance of falling. Ask your doctor what other things that you can do to help prevent falls. This information is not intended to replace advice given to you by your health care provider. Make sure you discuss any questions you have with your health care provider. Document Released: 02/19/2009 Document Revised: 10/01/2015 Document Reviewed: 05/30/2014 Elsevier Interactive Patient Education  2017 [employer].

## 2021-12-14 ENCOUNTER — Ambulatory Visit: Payer: Medicare Other

## 2021-12-15 ENCOUNTER — Ambulatory Visit (INDEPENDENT_AMBULATORY_CARE_PROVIDER_SITE_OTHER): Payer: Medicare Other | Admitting: Family Medicine

## 2021-12-15 ENCOUNTER — Ambulatory Visit (INDEPENDENT_AMBULATORY_CARE_PROVIDER_SITE_OTHER): Payer: Medicare Other

## 2021-12-15 VITALS — BP 138/78 | HR 63 | Temp 97.5°F

## 2021-12-15 DIAGNOSIS — R600 Localized edema: Secondary | ICD-10-CM

## 2021-12-15 DIAGNOSIS — M20032 Swan-neck deformity of left finger(s): Secondary | ICD-10-CM

## 2021-12-15 DIAGNOSIS — M7989 Other specified soft tissue disorders: Secondary | ICD-10-CM

## 2021-12-15 DIAGNOSIS — E871 Hypo-osmolality and hyponatremia: Secondary | ICD-10-CM

## 2021-12-15 DIAGNOSIS — I10 Essential (primary) hypertension: Secondary | ICD-10-CM | POA: Diagnosis not present

## 2021-12-15 DIAGNOSIS — M189 Osteoarthritis of first carpometacarpal joint, unspecified: Secondary | ICD-10-CM | POA: Diagnosis not present

## 2021-12-15 DIAGNOSIS — S6992XA Unspecified injury of left wrist, hand and finger(s), initial encounter: Secondary | ICD-10-CM | POA: Diagnosis not present

## 2021-12-15 DIAGNOSIS — R29898 Other symptoms and signs involving the musculoskeletal system: Secondary | ICD-10-CM

## 2021-12-15 LAB — BASIC METABOLIC PANEL
BUN: 18 mg/dL (ref 6–23)
CO2: 27 mEq/L (ref 19–32)
Calcium: 9.9 mg/dL (ref 8.4–10.5)
Chloride: 96 mEq/L (ref 96–112)
Creatinine, Ser: 0.83 mg/dL (ref 0.40–1.20)
GFR: 63.67 mL/min (ref >60.00)
Glucose, Bld: 64 mg/dL — ABNORMAL LOW (ref 70–99)
Potassium: 4.4 mEq/L (ref 3.5–5.1)
Sodium: 132 mEq/L — ABNORMAL LOW (ref 135–145)

## 2021-12-15 LAB — CBC WITH DIFFERENTIAL/PLATELET
Basophils Absolute: 0 10*3/uL (ref 0.0–0.1)
Basophils Relative: 1 % (ref 0.0–3.0)
Eosinophils Absolute: 0.1 10*3/uL (ref 0.0–0.7)
Eosinophils Relative: 1.8 % (ref 0.0–5.0)
HCT: 37.1 % (ref 36.0–46.0)
Hemoglobin: 12.1 g/dL (ref 12.0–15.0)
Lymphocytes Relative: 39.5 % (ref 12.0–46.0)
Lymphs Abs: 2 10*3/uL (ref 0.7–4.0)
MCHC: 32.7 g/dL (ref 30.0–36.0)
MCV: 94.2 fl (ref 78.0–100.0)
Monocytes Absolute: 0.4 10*3/uL (ref 0.1–1.0)
Monocytes Relative: 8.9 % (ref 3.0–12.0)
Neutro Abs: 2.4 10*3/uL (ref 1.4–7.7)
Neutrophils Relative %: 48.8 % (ref 43.0–77.0)
Platelets: 263 10*3/uL (ref 150.0–400.0)
RBC: 3.94 Mil/uL (ref 3.87–5.11)
RDW: 14.3 % (ref 11.5–15.5)
WBC: 5 10*3/uL (ref 4.0–10.5)

## 2021-12-15 LAB — SEDIMENTATION RATE: Sed Rate: 21 mm/hr (ref 0–30)

## 2021-12-15 NOTE — Patient Instructions (Signed)
I put in repeat labs to recheck sodium as it was low at the end of July.  I also put in labs to check inflammation.

## 2021-12-15 NOTE — Progress Notes (Signed)
Subjective:    Patient ID: Felicia Cross, female    DOB: 04-12-1933, 88 y.o.   MRN: [id number]  Chief Complaint  Patient presents with   Hand Pain    Left hand pain, hurts to extend, bend. Hurts when tries to use walker. Has been stiff. Pain started when she fell last year and broke elbow. Was in hospital this year in march.   Pt accompanied by her daughter and home aide.  HPI Patient was seen today for ongoing concerns.  Pt with L hand edema, contracture of fingers, and pain.  May have worsened in the last month.  Unable to use walker.  Pt's daughter applies biofreeze at night and massages finger to get them to bend.  Pt wearing compression glove which helps decrease swelling.  Patient denies recent fall or injury to LUE, neck pain, or left elbow pain.  H/o L rotator cuff tear and moderate before meals and glenohumeral arthrosis noted on Xray L shoulder 08/01/21 while hospitalized s/p fall.  Previous left shoulder x-ray 12/06/2020 was negative.  Continue to follow-up with neurology for evaluation of memory loss.  Labs for reversible causes were all negative.  No seizure activity noted on EEG.  Past Medical History:  Diagnosis Date   Anemia 01/18/2016   -reports on and off her whole life, on oral iron in the past remotely per report   Arthritis    B12 deficiency 01/18/2016   CAD (coronary artery disease)    CVD (cerebrovascular disease)    Diverticulosis of colon    H. pylori infection 10/20/2015   Hyperlipidemia    Hypertension    Osteoarthritis 03/07/2006   Qualifier: Diagnosis of  By: Cato Mulligan MD, Bruce      Allergies  Allergen Reactions   Codeine Nausea And Vomiting   Hydrocodone Nausea And Vomiting    ROS General: Denies fever, chills, night sweats, changes in weight, changes in appetite HEENT: Denies headaches, ear pain, changes in vision, rhinorrhea, sore throat CV: Denies CP, palpitations, SOB, orthopnea Pulm: Denies SOB, cough, wheezing GI: Denies abdominal pain, nausea,  vomiting, diarrhea, constipation GU: Denies dysuria, hematuria, frequency, vaginal discharge Msk: Denies muscle cramps, joint pains  + fingers of left hand hyperextended, painful, edematous Neuro: Denies weakness, numbness, tingling Skin: Denies rashes, bruising Psych: Denies depression, anxiety, hallucinations     Objective:    Blood pressure 138/78, pulse 63, temperature (!) 97.5 F (36.4 C), temperature source Oral, SpO2 99 %.  Gen. Pleasant, well-nourished, in no distress, normal affect   HEENT: [place]/AT, face symmetric, conjunctiva clear, no scleral icterus, PERRLA, EOMI, nares patent without drainage, pharynx without erythema or exudate. Neck: No JVD, no thyromegaly, no carotid bruits Lungs: no accessory muscle use, CTAB, no wheezes or rales Cardiovascular: RRR, no m/r/g, no peripheral edema Musculoskeletal: Left hand second third and fourth digits hyperextended with edema across MTP joints of left hand.  Active ROM of fingers of left hand severely limited.  TTP of left wrist. No TTP of left elbow or shoulder.  No cyanosis or clubbing, normal tone Neuro:  A&Ox3, CN II-XII intact, gait not assessed sitting in transport wheelchair Skin:  Warm, no lesions/ rash   Wt Readings from Last 3 Encounters:  12/13/21 150 lb (68 kg)  11/16/21 111 lb (50.3 kg)  11/05/21 147 lb 6.4 oz (66.9 kg)    Lab Results  Component Value Date   WBC 4.2 11/05/2021   HGB 12.1 11/05/2021   HCT 36.9 11/05/2021   PLT 228 11/05/2021  GLUCOSE 89 12/01/2021   CHOL 145 11/05/2021   TRIG 60 11/05/2021   HDL 63 11/05/2021   LDLDIRECT 138.1 05/19/2011   LDLCALC 68 11/05/2021   ALT 10 07/23/2021   AST 23 07/23/2021   NA 129 (L) 12/01/2021   K 5.2 12/01/2021   CL 94 (L) 12/01/2021   CREATININE 0.89 12/01/2021   BUN 12 12/01/2021   CO2 22 12/01/2021   TSH 2.670 11/16/2021   INR 1.04 09/04/2015   HGBA1C 5.9 12/18/2020    Assessment/Plan:  Hand edema  - Plan: DG Hand Complete Left, Basic metabolic  panel, CBC with Differential/Platelet, Rheumatoid Factor, Sedimentation Rate, referral to hand surgery  Swan-neck deformity of left finger(s) -new hyperextension of several digits of L hand. -given acute nature less likely 2/2 RA, but will obtain labs to r/o -concern for nerve compression causing symptoms. -discussed referral to hand surgery  - Plan: DG Hand Complete Left, CBC with Differential/Platelet, Rheumatoid Factor, Sedimentation Rate, referral to hand surgery  Hyponatremia  -Na 129 on 12/01/21 -possibly 2/2 medications such as keppra. - Plan: Basic metabolic panel  Essential hypertension -Controlled -Continue current medications losartan 100 mg daily, Lopressor 25 mg twice daily, spironolactone 25 mg daily -Continue lifestyle modifications including reducing sodium intake. - Plan: Basic metabolic panel  Hyperextension of joint of left hand -New hyperextension of second third and fourth digits of left hand limiting patient's mobility she is unable to use her walker -Also consider OT -Plan: Referral to hand surgery  F/u as needed.  Abbe Amsterdam, MD

## 2021-12-17 LAB — RHEUMATOID FACTOR: Rheumatoid fact SerPl-aCnc: <14 IU/mL (ref <14)

## 2021-12-20 ENCOUNTER — Encounter: Payer: Self-pay | Admitting: Family Medicine

## 2021-12-22 DIAGNOSIS — M25532 Pain in left wrist: Secondary | ICD-10-CM | POA: Diagnosis not present

## 2021-12-22 DIAGNOSIS — M62838 Other muscle spasm: Secondary | ICD-10-CM | POA: Diagnosis not present

## 2021-12-24 ENCOUNTER — Telehealth: Payer: Self-pay | Admitting: Family Medicine

## 2021-12-24 NOTE — Telephone Encounter (Signed)
Pt called to request a refill of the  metoprolol tartrate (LOPRESSOR) 25 MG tablet  Pt's LOV: 12/15/21  Please advise.  [facility] [address] [place] - [address] Phone:  [phone]  Fax:  [phone]

## 2021-12-27 MED ORDER — METOPROLOL TARTRATE 25 MG PO TABS: 25.0000 mg | ORAL_TABLET | Freq: Two times a day (BID) | ORAL | 0 refills | Status: DC

## 2021-12-27 NOTE — Telephone Encounter (Signed)
Refill sent to requested pharmacy.

## 2021-12-29 ENCOUNTER — Ambulatory Visit: Payer: Medicare Other | Admitting: Podiatry

## 2021-12-30 NOTE — Progress Notes (Deleted)
Cardiology Clinic Note   Patient Name: Felicia Cross Date of Encounter: 12/30/2021  Primary Care Provider:  Deeann Saint, MD Primary Cardiologist:  Olga Millers, MD  Patient Profile    88 year old female with known history of hypertension, minimal CAD, and hyperlipidemia.  The patient has frequent falls related to dizziness on meclizine. She has had several falls. She has been seen in the ED for hypertensive urgency  on 10/08/2021. At that time she was using a walker for ambulation and work with home PT. Blood pressure has been difficult to control despite increasing doses of spironolactone by PCP now at 25 mg daily along with metoprolol and losartan. On last office visit, BP was 169/85. Labs were completed on 12/15/2021 with creatinine of 0.83, potassium of 4.4. GFR 63.67.   Past Medical History    Past Medical History:  Diagnosis Date   Anemia 01/18/2016   -reports on and off her whole life, on oral iron in the past remotely per report   Arthritis    B12 deficiency 01/18/2016   CAD (coronary artery disease)    CVD (cerebrovascular disease)    Diverticulosis of colon    H. pylori infection 10/20/2015   Hyperlipidemia    Hypertension    Osteoarthritis 03/07/2006   Qualifier: Diagnosis of  By: Cato Mulligan MD, Bruce     Past Surgical History:  Procedure Laterality Date   ABDOMINAL HYSTERECTOMY  1984   REPLACEMENT TOTAL KNEE  2007   left   TOTAL HIP ARTHROPLASTY  2010   Dr. Turner Daniels, right   TOTAL KNEE ARTHROPLASTY Right 09/14/2015   Procedure: TOTAL KNEE ARTHROPLASTY;  Surgeon: Dannielle Huh, MD;  Location: [facility] OR;  Service: Orthopedics;  Laterality: Right;    Allergies  Allergies  Allergen Reactions   Codeine Nausea And Vomiting   Hydrocodone Nausea And Vomiting    History of Present Illness    Felicia Cross is here for ongoing assessment and management of difficult to control hypertension, on spironolactone, metoprolol and losartan. Labs from 12/15/2021 have been reviewed with no  evidence of hyperkalemia or reduced kidney function.She was advised to keep track of BP at home and to stop eating [employer] candy bars before bed.  Home Medications    Current Outpatient Medications  Medication Sig Dispense Refill   acetaminophen (TYLENOL) 325 MG tablet Take 975 mg by mouth See admin instructions. Take 3 tablets (975 mg) by mouth three times daily, may also take 2 tablets (650 mg) by mouth every 6 hours as needed for moderate pain/headache     amoxicillin (AMOXIL) 500 MG capsule Take 500 mg by mouth 2 (two) times daily. (Patient not taking: Reported on 12/13/2021)     aspirin EC 81 MG tablet Take 1 tablet (81 mg total) by mouth daily. (Patient taking differently: Take 81 mg by mouth every other day.)     Blood Pressure Monitoring (BLOOD PRESSURE CUFF) MISC Use as directed for checking bp daily. 1 each 0   levETIRAcetam (KEPPRA) 500 MG tablet Keppra 500 mg twice daily x6 weeks, then to 250mg  BID x 1 week (Patient taking differently: 250-500 mg See admin instructions. Ordered 07/27/21: Take one tablet (500 mg) by mouth twice daily for 6 weeks, then take 250 mg twice daily for 1 week)     losartan (COZAAR) 100 MG tablet Take 1 tablet (100 mg total) by mouth daily. 90 tablet 1   memantine (NAMENDA TITRATION PAK) tablet pack 5 mg/day for =1 week; 5 mg twice daily for =1  week; 15 mg/day given in 5 mg and 10 mg separated doses for =1 week; then 10 mg twice daily 49 tablet 12   memantine (NAMENDA) 10 MG tablet Take 1 tablet (10 mg total) by mouth 2 (two) times daily. 60 tablet 3   metoprolol tartrate (LOPRESSOR) 25 MG tablet Take 1 tablet (25 mg total) by mouth 2 (two) times daily. 180 tablet 0   Multiple Vitamin-Folic Acid TABS Take 1 tablet by mouth every morning.     simvastatin (ZOCOR) 40 MG tablet Take 1 tablet (40 mg total) by mouth daily. 46 tablet 3   spironolactone (ALDACTONE) 25 MG tablet Take 1 tablet (25 mg total) by mouth daily. 90 tablet 3   vitamin B-12 (CYANOCOBALAMIN) 1000 MCG  tablet Take 1 tablet (1,000 mcg total) by mouth daily.     No current facility-administered medications for this visit.     Family History    Family History  Problem Relation Age of Onset   Diabetes Mother    Hypertension Father    Kidney disease Father    Heart attack Sister    Heart attack Brother    Colon cancer Neg Hx    Stomach cancer Neg Hx    Esophageal cancer Neg Hx    Rectal cancer Neg Hx    She indicated that her mother is deceased. She indicated that her father is deceased. She indicated that the status of her sister is unknown. She indicated that the status of her brother is unknown. She indicated that the status of her neg hx is unknown.  Social History    Social History   Socioeconomic History   Marital status: Married    Spouse name: Not on file   Number of children: Not on file   Years of education: Not on file   Highest education level: Not on file  Occupational History   Not on file  Tobacco Use   Smoking status: Former    Packs/day: 1.00    Years: 30.00    Total pack years: 30.00    Types: Cigarettes    Quit date: 11/01/1980    Years since quitting: 41.1   Smokeless tobacco: Never  Vaping Use   Vaping Use: Never used  Substance and Sexual Activity   Alcohol use: Not Currently    Comment: glass of wine   Drug use: No   Sexual activity: Not on file  Other Topics Concern   Not on file  Social History Narrative   Updated 01/07/15   Work or School: retired from Geneticist, molecular, 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 [place]      Home 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, Information systems manager and two great grandchildren age 98 and 68      Spiritual Beliefs: Christian      Lifestyle: regular exercise at the [facility]; healthy diet      Social Determinants of Health   Financial Resource Strain: Low Risk  (12/13/2021)   Overall Financial Resource Strain (CARDIA)    Difficulty of Paying Living Expenses: Not hard at all  Food Insecurity: No Food Insecurity (12/13/2021)   Hunger Vital Sign     Worried About Running Out of Food in the Last Year: Never true    Ran Out of Food in the Last Year: Never true  Transportation Needs: No Transportation Needs (12/13/2021)   PRAPARE - Administrator, Civil Service (Medical): No    Lack of Transportation (Non-Medical): No  Physical Activity: Inactive (12/13/2021)   Exercise Vital  Sign    Days of Exercise per Week: 0 days    Minutes of Exercise per Session: 0 min  Stress: No Stress Concern Present (12/13/2021)   [employer] of Occupational Health - Occupational Stress Questionnaire    Feeling of Stress : Not at all  Social Connections: Not on file  Intimate Partner Violence: Not on file     Review of Systems    General:  No chills, fever, night sweats or weight changes.  Cardiovascular:  No chest pain, dyspnea on exertion, edema, orthopnea, palpitations, paroxysmal nocturnal dyspnea. Dermatological: No rash, lesions/masses Respiratory: No cough, dyspnea Urologic: No hematuria, dysuria Abdominal:   No nausea, vomiting, diarrhea, bright red blood per rectum, melena, or hematemesis Neurologic:  No visual changes, wkns, changes in mental status. All other systems reviewed and are otherwise negative except as noted above.     Physical Exam    VS:  There were no vitals taken for this visit. , BMI There is no height or weight on file to calculate BMI.     GEN: Well nourished, well developed, in no acute distress. HEENT: normal. Neck: Supple, no JVD, carotid bruits, or masses. Cardiac: RRR, no murmurs, rubs, or gallops. No clubbing, cyanosis, edema.  Radials/DP/PT 2+ and equal bilaterally.  Respiratory:  Respirations regular and unlabored, clear to auscultation bilaterally. GI: Soft, nontender, nondistended, BS + x 4. MS: no deformity or atrophy. Skin: warm and dry, no rash. Neuro:  Strength and sensation are intact. Psych: Normal affect.  Accessory Clinical Findings    ECG personally reviewed by me today- *** - No  acute changes  Lab Results  Component Value Date   WBC 5.0 12/15/2021   HGB 12.1 12/15/2021   HCT 37.1 12/15/2021   MCV 94.2 12/15/2021   PLT 263.0 12/15/2021   Lab Results  Component Value Date   CREATININE 0.83 12/15/2021   BUN 18 12/15/2021   NA 132 (L) 12/15/2021   K 4.4 12/15/2021   CL 96 12/15/2021   CO2 27 12/15/2021   Lab Results  Component Value Date   ALT 10 07/23/2021   AST 23 07/23/2021   ALKPHOS 33 (L) 07/23/2021   BILITOT 0.7 07/23/2021   Lab Results  Component Value Date   CHOL 145 11/05/2021   HDL 63 11/05/2021   LDLCALC 68 11/05/2021   LDLDIRECT 138.1 05/19/2011   TRIG 60 11/05/2021   CHOLHDL 2.3 11/05/2021    Lab Results  Component Value Date   HGBA1C 5.9 12/18/2020    Review of Prior Studies:  Echocardiogram 07/24/2021 1. Left ventricular ejection fraction, by estimation, is 60 to 65%. The  left ventricle has normal function. The left ventricle has no regional  wall motion abnormalities. There is moderate left ventricular hypertrophy  of the basal segment. Left  ventricular diastolic parameters are consistent with Grade I diastolic  dysfunction (impaired relaxation).   2. Right ventricular systolic function is normal. The right ventricular  size is normal. There is mildly elevated pulmonary artery systolic  pressure.   3. No evidence of mitral valve regurgitation.   4. Aortic valve regurgitation is not visualized. Aortic valve  sclerosis/calcification is present, without any evidence of aortic  stenosis.   5. The inferior vena cava is dilated in size with >50% respiratory  variability, suggesting right atrial pressure of 8 mmHg.  Assessment & Plan   1.  ***    Current medicines are reviewed at length with the patient today.  I have spent *** min's  dedicated to the care of this patient on the date of this encounter to include pre-visit review of records, assessment, management and diagnostic testing,with shared decision  making. Signed, Bettey Mare. Liborio Nixon, ANP, AACC   12/30/2021 2:16 PM      Office [phone] Fax [phone]  Notice: This dictation was prepared with Dragon dictation along with smaller phrase technology. Any transcriptional errors that result from this process are unintentional and may not be corrected upon review.

## 2021-12-31 ENCOUNTER — Ambulatory Visit: Payer: Medicare Other | Admitting: Adult Health

## 2022-01-04 ENCOUNTER — Encounter: Payer: Self-pay | Admitting: Physician Assistant

## 2022-01-04 ENCOUNTER — Ambulatory Visit: Payer: Medicare Other | Attending: Adult Health | Admitting: Physician Assistant

## 2022-01-04 VITALS — BP 105/70 | HR 65 | Ht 64.0 in | Wt 150.6 lb

## 2022-01-04 DIAGNOSIS — E785 Hyperlipidemia, unspecified: Secondary | ICD-10-CM

## 2022-01-04 DIAGNOSIS — I2510 Atherosclerotic heart disease of native coronary artery without angina pectoris: Secondary | ICD-10-CM | POA: Diagnosis not present

## 2022-01-04 DIAGNOSIS — I10 Essential (primary) hypertension: Secondary | ICD-10-CM

## 2022-01-04 NOTE — Patient Instructions (Addendum)
Medication Instructions:  Take Losartan 100 mg Take 1/2 a tablet in the morning and 1/2 tablet a night.   *If you need a refill on your cardiac medications before your next appointment, please call your pharmacy*   Lab Work: NONE ordered at this time of appointment   If you have labs (blood work) drawn today and your tests are completely normal, you will receive your results only by: MyChart Message (if you have MyChart) OR A paper copy in the mail If you have any lab test that is abnormal or we need to change your treatment, we will call you to review the results.   Testing/Procedures: NONE ordered at this time of appointment     Follow-Up: At [facility], you and your health needs are our priority.  As part of our continuing mission to provide you with exceptional heart care, we have created designated Provider Care Teams.  These Care Teams include your primary Cardiologist (physician) and Advanced Practice Providers (APPs -  Physician Assistants and Nurse Practitioners) who all work together to provide you with the care you need, when you need it.  We recommend signing up for the patient portal called "MyChart".  Sign up information is provided on this After Visit Summary.  MyChart is used to connect with patients for Virtual Visits (Telemedicine).  Patients are able to view lab/test results, encounter notes, upcoming appointments, etc.  Non-urgent messages can be sent to your provider as well.   To learn more about what you can do with MyChart, go to [email or web address].    Your next appointment:   6 month(s)  The format for your next appointment:   In Person  Provider:   Olga Millers, MD     Other Instructions   Important Information About Sugar

## 2022-01-04 NOTE — Progress Notes (Signed)
Cardiology Office Note:    Date:  01/06/2022   ID:  Felicia Cross, DOB [birth year], MRN [id number]  PCP:  Deeann Saint, MD   [facility] Providers Cardiologist:  Olga Millers, MD     Referring MD: Deeann Saint, MD   Chief Complaint  Patient presents with   Follow-up    Seen for Dr. Jens Som    History of Present Illness:    Felicia Cross is a 88 y.o. female with a hx of hypertension, hyperlipidemia, and history of minimal CAD.  Previous cardiac catheterization performed via August 2006 showed minor disease in the diagonal and the distal LAD.  Carotid Doppler in February 2012 showed 0 to 39% disease bilaterally.  Stress echo in April 2015 was normal.  Patient was last seen by Dr. Jens Som in July 2021 at which time she was doing well.  She was seen by Bailey Mech in October 2022 for frequent falls and dizziness.  Her HCTZ was decreased to 12.5 mg daily to allow higher blood pressure.  More recently, patient presented to the hospital in March 2023 after a fall and had seizure in the emergency room.  Orthostatic vital sign was positive, she was treated with IVF.  Echocardiogram obtained during hospitalization showed EF 60 to 65%, grade 1 DD.  CT of the head showed moderate left extracranial hematoma over the frontal bone, no acute intracranial abnormality.  MRI of the brain showed trace posttraumatic intraventricular hemorrhage layering in the left occipital horn, no other acute intracranial abnormality.  MRI of the C-spine show evidence of mild posttraumatic ligamentous injury at C1-C2.  She was evaluated by neurology service and started on a short course of Keppra.  The etiology behind syncope and fall was attributed to orthostatic hypotension.  Diuretic was held and the patient was allowed permissive hypertension.  Shortly after discharge, patient returned to the emergency room after falling again.  Patient returned back to the ED on 10/08/2021 with dizziness.   She was noted to be hypertensive at the time with systolic blood pressure in the 200s.  Prior to the ED visit, she was started on low-dose 12.5 mg daily of spironolactone by her PCP.  CT of the head did not show significant changes.  MRI was also reassuring.  She was treated with IV hydralazine and her systolic blood pressure improved to 130s.  Patient was most recently seen by Bailey Mech again on 10/15/2021, blood pressure was elevated in the 160s despite starting on 12.5 mg daily of spironolactone in June 2023.  Spironolactone was further increased to 25 mg daily during the office visit.  Since the last visit, patient has been seen by neurology service on 7/11 for gait abnormality and memory loss, patient was started on memantine.  Blood pressure on that day was 153/88.  Patient presents today for follow-up.  She denies any significant dizziness recently.  She is accompanied by caretaker.  She is able to walk around with a walker at home.  She denies any significant chest pain or shortness of breath.  According to her caretaker, her blood pressure tend to be very high early in the morning, however tend to be low in the afternoon.  I recommended break her losartan in half and take half in the morning and half in the afternoon to make the blood pressure more even.  She has not noticed any orthostatic dizziness recently.  I did ask her to keep a glass of water at the bedside  so she can drink some in the morning before trying to get up.  Overall, I think she is quite stable and can follow-up with Dr. Jens Som in 6 months.  Past Medical History:  Diagnosis Date   Anemia 01/18/2016   -reports on and off her whole life, on oral iron in the past remotely per report   Arthritis    B12 deficiency 01/18/2016   CAD (coronary artery disease)    CVD (cerebrovascular disease)    Diverticulosis of colon    H. pylori infection 10/20/2015   Hyperlipidemia    Hypertension    Osteoarthritis 03/07/2006   Qualifier:  Diagnosis of  By: Cato Mulligan MD, Bruce      Past Surgical History:  Procedure Laterality Date   ABDOMINAL HYSTERECTOMY  1984   REPLACEMENT TOTAL KNEE  2007   left   TOTAL HIP ARTHROPLASTY  2010   Dr. Turner Daniels, right   TOTAL KNEE ARTHROPLASTY Right 09/14/2015   Procedure: TOTAL KNEE ARTHROPLASTY;  Surgeon: Dannielle Huh, MD;  Location: [facility] OR;  Service: Orthopedics;  Laterality: Right;    Current Medications: Current Meds  Medication Sig   acetaminophen (TYLENOL) 325 MG tablet Take 975 mg by mouth See admin instructions. Take 3 tablets (975 mg) by mouth three times daily, may also take 2 tablets (650 mg) by mouth every 6 hours as needed for moderate pain/headache   amoxicillin (AMOXIL) 500 MG capsule Take 500 mg by mouth 2 (two) times daily.   aspirin EC 81 MG tablet Take 1 tablet (81 mg total) by mouth daily. (Patient taking differently: Take 81 mg by mouth every other day.)   Blood Pressure Monitoring (BLOOD PRESSURE CUFF) MISC Use as directed for checking bp daily.   losartan (COZAAR) 100 MG tablet Take 1 tablet (100 mg total) by mouth daily. (Patient taking differently: Take 100 mg by mouth daily. Take 1/2 tab in the morning and 1/2 tab in the evening)   metoprolol tartrate (LOPRESSOR) 25 MG tablet Take 1 tablet (25 mg total) by mouth 2 (two) times daily.   Multiple Vitamin-Folic Acid TABS Take 1 tablet by mouth every morning.   simvastatin (ZOCOR) 40 MG tablet Take 1 tablet (40 mg total) by mouth daily.   spironolactone (ALDACTONE) 25 MG tablet Take 1 tablet (25 mg total) by mouth daily.   vitamin B-12 (CYANOCOBALAMIN) 1000 MCG tablet Take 1 tablet (1,000 mcg total) by mouth daily.     Allergies:   Codeine and Hydrocodone   Social History   Socioeconomic History   Marital status: Married    Spouse name: Not on file   Number of children: Not on file   Years of education: Not on file   Highest education level: Not on file  Occupational History   Not on file  Tobacco Use   Smoking  status: Former    Packs/day: 1.00    Years: 30.00    Total pack years: 30.00    Types: Cigarettes    Quit date: 11/01/1980    Years since quitting: 41.2   Smokeless tobacco: Never  Vaping Use   Vaping Use: Never used  Substance and Sexual Activity   Alcohol use: Not Currently    Comment: glass of wine   Drug use: No   Sexual activity: Not on file  Other Topics Concern   Not on file  Social History Narrative   Updated 01/07/15   Work or School: retired from Geneticist, molecular, 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 [employer]  Home 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, Information systems manager and two great grandchildren age 36 and 54      Spiritual Beliefs: Christian      Lifestyle: regular exercise at the [facility]; healthy diet      Social Determinants of Health   Financial Resource Strain: Low Risk  (12/13/2021)   Overall Financial Resource Strain (CARDIA)    Difficulty of Paying Living Expenses: Not hard at all  Food Insecurity: No Food Insecurity (12/13/2021)   Hunger Vital Sign    Worried About Running Out of Food in the Last Year: Never true    Ran Out of Food in the Last Year: Never true  Transportation Needs: No Transportation Needs (12/13/2021)   PRAPARE - Administrator, Civil Service (Medical): No    Lack of Transportation (Non-Medical): No  Physical Activity: Inactive (12/13/2021)   Exercise Vital Sign    Days of Exercise per Week: 0 days    Minutes of Exercise per Session: 0 min  Stress: No Stress Concern Present (12/13/2021)   [employer] of Occupational Health - Occupational Stress Questionnaire    Feeling of Stress : Not at all  Social Connections: Not on file     Family History: The patient's family history includes Diabetes in her mother; Heart attack in her brother and sister; Hypertension in her father; Kidney disease in her father. There is no history of Colon cancer, Stomach cancer, Esophageal cancer, or Rectal cancer.  ROS:   Please see the history of present illness.     All  other systems reviewed and are negative.  EKGs/Labs/Other Studies Reviewed:    The following studies were reviewed today:  Echo 07/24/2021  1. Left ventricular ejection fraction, by estimation, is 60 to 65%. The  left ventricle has normal function. The left ventricle has no regional  wall motion abnormalities. There is moderate left ventricular hypertrophy  of the basal segment. Left  ventricular diastolic parameters are consistent with Grade I diastolic  dysfunction (impaired relaxation).   2. Right ventricular systolic function is normal. The right ventricular  size is normal. There is mildly elevated pulmonary artery systolic  pressure.   3. No evidence of mitral valve regurgitation.   4. Aortic valve regurgitation is not visualized. Aortic valve  sclerosis/calcification is present, without any evidence of aortic  stenosis.   5. The inferior vena cava is dilated in size with >50% respiratory  variability, suggesting right atrial pressure of 8 mmHg.   EKG:  EKG is not ordered today.    Recent Labs: 07/23/2021: ALT 10 07/24/2021: Magnesium 1.8 11/16/2021: TSH 2.670 12/15/2021: BUN 18; Creatinine, Ser 0.83; Hemoglobin 12.1; Platelets 263.0; Potassium 4.4; Sodium 132  Recent Lipid Panel    Component Value Date/Time   CHOL 145 11/05/2021 1513   CHOL 180 11/05/2018 1233   TRIG 60 11/05/2021 1513   HDL 63 11/05/2021 1513   HDL 72 11/05/2018 1233   CHOLHDL 2.3 11/05/2021 1513   VLDL 12.2 12/18/2020 1042   LDLCALC 68 11/05/2021 1513   LDLDIRECT 138.1 05/19/2011 1055     Risk Assessment/Calculations:           Physical Exam:    VS:  BP 105/70   Pulse 65   Ht 5\' 4"  (1.626 m)   Wt 150 lb 9.6 oz (68.3 kg)   SpO2 99%   BMI 25.85 kg/m        Wt Readings from Last 3 Encounters:  01/04/22 150 lb 9.6 oz (68.3 kg)  12/13/21  150 lb (68 kg)  11/16/21 111 lb (50.3 kg)     GEN:  Well nourished, well developed in no acute distress HEENT: Normal NECK: No JVD; No carotid  bruits LYMPHATICS: No lymphadenopathy CARDIAC: RRR, no murmurs, rubs, gallops RESPIRATORY:  Clear to auscultation without rales, wheezing or rhonchi  ABDOMEN: Soft, non-tender, non-distended MUSCULOSKELETAL:  No edema; No deformity  SKIN: Warm and dry NEUROLOGIC:  Alert and oriented x 3 PSYCHIATRIC:  Normal affect   ASSESSMENT:    1. Coronary artery disease involving native coronary artery of native heart without angina pectoris   2. Essential hypertension   3. Hyperlipidemia LDL goal <70    PLAN:    In order of problems listed above:  CAD: Denies any recent chest discomfort.  History of minimal CAD on previous cardiac catheterization.  Hypertension: Blood pressure tend to be high in the morning and low in the afternoon.  We will split losartan in half and take twice a day instead  Hyperlipidemia: Continue simvastatin.           Medication Adjustments/Labs and Tests Ordered: Current medicines are reviewed at length with the patient today.  Concerns regarding medicines are outlined above.  No orders of the defined types were placed in this encounter.  No orders of the defined types were placed in this encounter.   Patient Instructions  Medication Instructions:  Take Losartan 100 mg Take 1/2 a tablet in the morning and 1/2 tablet a night.   *If you need a refill on your cardiac medications before your next appointment, please call your pharmacy*   Lab Work: NONE ordered at this time of appointment   If you have labs (blood work) drawn today and your tests are completely normal, you will receive your results only by: MyChart Message (if you have MyChart) OR A paper copy in the mail If you have any lab test that is abnormal or we need to change your treatment, we will call you to review the results.   Testing/Procedures: NONE ordered at this time of appointment     Follow-Up: At [facility], you and your health needs are our priority.  As part of our  continuing mission to provide you with exceptional heart care, we have created designated Provider Care Teams.  These Care Teams include your primary Cardiologist (physician) and Advanced Practice Providers (APPs -  Physician Assistants and Nurse Practitioners) who all work together to provide you with the care you need, when you need it.  We recommend signing up for the patient portal called "MyChart".  Sign up information is provided on this After Visit Summary.  MyChart is used to connect with patients for Virtual Visits (Telemedicine).  Patients are able to view lab/test results, encounter notes, upcoming appointments, etc.  Non-urgent messages can be sent to your provider as well.   To learn more about what you can do with MyChart, go to [email or web address].    Your next appointment:   6 month(s)  The format for your next appointment:   In Person  Provider:   Olga Millers, MD     Other Instructions   Important Information About Sugar         Ramond Dial, [place]  01/06/2022 10:13 PM    [facility]

## 2022-01-05 ENCOUNTER — Ambulatory Visit (INDEPENDENT_AMBULATORY_CARE_PROVIDER_SITE_OTHER): Payer: Medicare Other | Admitting: Podiatry

## 2022-01-05 DIAGNOSIS — B351 Tinea unguium: Secondary | ICD-10-CM

## 2022-01-05 DIAGNOSIS — M79675 Pain in left toe(s): Secondary | ICD-10-CM

## 2022-01-05 DIAGNOSIS — M79674 Pain in right toe(s): Secondary | ICD-10-CM

## 2022-01-05 NOTE — Progress Notes (Signed)
This patient presents to the office with chief complaint of long thick painful nails.  Patient says the nails are painful walking and wearing shoes.  This patient is unable to self treat.  This patient is unable to trim her nails since she is unable to reach her nails. She presents to the office with her caregiver.  She presents to the office for preventative foot care services.  General Appearance  Alert, conversant and in no acute stress.  Vascular  Dorsalis pedis and posterior tibial  pulses are weakly  palpable  bilaterally.  Capillary return is within normal limits  bilaterally. Temperature is within normal limits  bilaterally.  Neurologic  Senn-Weinstein monofilament wire test within normal limits  bilaterally. Muscle power within normal limits bilaterally.  Nails Thick disfigured discolored nails with subungual debris  from hallux to fifth toes bilaterally. No evidence of bacterial infection or drainage bilaterally.  Orthopedic  No limitations of motion  feet .  No crepitus or effusions noted.  No bony pathology or digital deformities noted.   Skin  normotropic skin with no porokeratosis noted bilaterally.  No signs of infections or ulcers noted.     Onychomycosis  Nails  B/L.  Pain in right toes  Pain in left toes  Debridement of nails both feet followed trimming the nails with dremel tool.    RTC 3 months.  Apostolos Blagg D.P.M. 

## 2022-01-06 ENCOUNTER — Encounter: Payer: Self-pay | Admitting: Physician Assistant

## 2022-01-19 ENCOUNTER — Encounter: Payer: Self-pay | Admitting: Family Medicine

## 2022-01-19 ENCOUNTER — Ambulatory Visit (INDEPENDENT_AMBULATORY_CARE_PROVIDER_SITE_OTHER): Payer: Medicare Other | Admitting: Family Medicine

## 2022-01-19 VITALS — BP 118/66 | HR 67 | Temp 98.5°F | Wt 150.0 lb

## 2022-01-19 DIAGNOSIS — R2689 Other abnormalities of gait and mobility: Secondary | ICD-10-CM | POA: Diagnosis not present

## 2022-01-19 DIAGNOSIS — H539 Unspecified visual disturbance: Secondary | ICD-10-CM

## 2022-01-19 DIAGNOSIS — M62838 Other muscle spasm: Secondary | ICD-10-CM

## 2022-01-19 DIAGNOSIS — R531 Weakness: Secondary | ICD-10-CM | POA: Diagnosis not present

## 2022-01-19 LAB — COMPREHENSIVE METABOLIC PANEL
ALT: 12 U/L (ref 0–35)
AST: 20 U/L (ref 0–37)
Albumin: 3.9 g/dL (ref 3.5–5.2)
Alkaline Phosphatase: 47 U/L (ref 39–117)
BUN: 19 mg/dL (ref 6–23)
CO2: 26 mEq/L (ref 19–32)
Calcium: 9.6 mg/dL (ref 8.4–10.5)
Chloride: 97 mEq/L (ref 96–112)
Creatinine, Ser: 1.01 mg/dL (ref 0.40–1.20)
GFR: 50.28 mL/min — ABNORMAL LOW (ref >60.00)
Glucose, Bld: 77 mg/dL (ref 70–99)
Potassium: 4.2 mEq/L (ref 3.5–5.1)
Sodium: 132 mEq/L — ABNORMAL LOW (ref 135–145)
Total Bilirubin: 0.4 mg/dL (ref 0.2–1.2)
Total Protein: 6.7 g/dL (ref 6.0–8.3)

## 2022-01-19 LAB — CBC WITH DIFFERENTIAL/PLATELET
Basophils Absolute: 0 10*3/uL (ref 0.0–0.1)
Basophils Relative: 1 % (ref 0.0–3.0)
Eosinophils Absolute: 0.1 10*3/uL (ref 0.0–0.7)
Eosinophils Relative: 1.5 % (ref 0.0–5.0)
HCT: 35.8 % — ABNORMAL LOW (ref 36.0–46.0)
Hemoglobin: 12.1 g/dL (ref 12.0–15.0)
Lymphocytes Relative: 36.7 % (ref 12.0–46.0)
Lymphs Abs: 1.7 10*3/uL (ref 0.7–4.0)
MCHC: 33.8 g/dL (ref 30.0–36.0)
MCV: 92.8 fl (ref 78.0–100.0)
Monocytes Absolute: 0.4 10*3/uL (ref 0.1–1.0)
Monocytes Relative: 8.8 % (ref 3.0–12.0)
Neutro Abs: 2.3 10*3/uL (ref 1.4–7.7)
Neutrophils Relative %: 52 % (ref 43.0–77.0)
Platelets: 244 10*3/uL (ref 150.0–400.0)
RBC: 3.86 Mil/uL — ABNORMAL LOW (ref 3.87–5.11)
RDW: 14.3 % (ref 11.5–15.5)
WBC: 4.5 10*3/uL (ref 4.0–10.5)

## 2022-01-19 LAB — POCT URINALYSIS DIPSTICK
Bilirubin, UA: NEGATIVE
Blood, UA: NEGATIVE
Glucose, UA: NEGATIVE
Ketones, UA: NEGATIVE
Leukocytes, UA: NEGATIVE
Nitrite, UA: NEGATIVE
Protein, UA: NEGATIVE
Spec Grav, UA: 1.015 (ref 1.010–1.025)
Urobilinogen, UA: NEGATIVE E.U./dL — AB
pH, UA: 6 (ref 5.0–8.0)

## 2022-01-19 LAB — TSH: TSH: 2.45 u[IU]/mL (ref 0.35–5.50)

## 2022-01-19 LAB — T4, FREE: Free T4: 1.06 ng/dL (ref 0.60–1.60)

## 2022-01-19 NOTE — Progress Notes (Signed)
Subjective:    Patient ID: Felicia Cross, female    DOB: [birth year], 88 y.o.   MRN: [id number]  Chief Complaint  Patient presents with   unstable    Very unstable for about 2 weeks, has not fallen   Pt accompanied by her daughter and [facility] aide, Eber Jones.  HPI Patient is an 88 year old female with pmh sig for HTN, HLD, repeated falls/balance issues, CBD, OA s/p TKR, history of vitamin B12 deficiency who was seen today for ongoing concern.  Per pt she has been unable to walk x 2 wks.  Per pt's daughter there has been a progressive weakness of L side over at least 1 month.  Pt requiring more assistance with transfers.  Per caregiver and daughter pt has been "dead weight" when assisting her.  Pt had a near fall hitting her head on a tv.  Pt endorses feeling dizzy first thing in the morning when waking up.  Will sit on side of bed till feels stable.  Pt states she is doing exercises learned in PT daily.  Patient's daughter expresses frustration that no one has been able to give pt diagnosis.  Patient seen by neurology on several occasions.  Had neuropsych testing.  Patient with increased falls.  Several CT and MRI brain of the last few months chronic mild to moderate microvascular changes.  EEG negative for seizure disorder.  No upcoming follow-up with neurology scheduled.  Patient seen by Ortho, Dr. Merlyn Lot, for left hand edema and contracture causing hyperextension of fingers at pip.  Patient states hand is not painful.  She tries to exercise it daily.  Per chart review Botox offered however will need to be done by neurologist.  Pt eating pork rinds during visit.  Past Medical History:  Diagnosis Date   Anemia 01/18/2016   -reports on and off her whole life, on oral iron in the past remotely per report   Arthritis    B12 deficiency 01/18/2016   CAD (coronary artery disease)    CVD (cerebrovascular disease)    Diverticulosis of colon    H. pylori infection 10/20/2015   Hyperlipidemia     Hypertension    Osteoarthritis 03/07/2006   Qualifier: Diagnosis of  By: Cato Mulligan MD, Bruce      Allergies  Allergen Reactions   Codeine Nausea And Vomiting   Hydrocodone Nausea And Vomiting    ROS General: Denies fever, chills, night sweats, changes in weight, changes in appetite  + weakness, balance issue HEENT: Denies headaches, ear pain, changes in vision, rhinorrhea, sore throat  +cloudy vision CV: Denies CP, palpitations, SOB, orthopnea Pulm: Denies SOB, cough, wheezing GI: Denies abdominal pain, nausea, vomiting, diarrhea, constipation GU: Denies dysuria, hematuria, frequency, vaginal discharge Msk: Denies muscle cramps, joint pains +LUE spasticity Neuro: Denies numbness, tingling  + LE weakness Skin: Denies rashes, bruising Psych: Denies depression, anxiety, hallucinations     Objective:    Blood pressure 118/66, pulse 67, temperature 98.5 F (36.9 C), temperature source Oral, weight 150 lb (68 kg), SpO2 98 %.  Gen. Pleasant, well-nourished, in no distress, normal affect   HEENT: Floyd/AT, face symmetric, conjunctiva clear, no scleral icterus, PERRLA, EOMI, no nystagmus, nares patent without drainage, pharynx without erythema or exudate.  TMs normal bilaterally. Neck: No JVD, no thyromegaly, no carotid bruits Lungs: no accessory muscle use, CTAB, no wheezes or rales Cardiovascular: RRR, no m/r/g, no peripheral edema Abdomen: BS present, soft, NT/ND, no hepatosplenomegaly. Musculoskeletal: LUE partially flexed with hyperextension of 2nd, 3rd, and 4th  L digits at PIP joint.  Bilateral LEs with normal tone, 5/5 strength in bilateral LEs.  LLE size>RLE.  No cyanosis or clubbing, normal tone Neuro:  A&Ox3, CN II-XII intact, normal gait Skin:  Warm, no lesions/ rash   Wt Readings from Last 3 Encounters:  01/19/22 150 lb (68 kg)  01/04/22 150 lb 9.6 oz (68.3 kg)  12/13/21 150 lb (68 kg)    Lab Results  Component Value Date   WBC 5.0 12/15/2021   HGB 12.1 12/15/2021   HCT  37.1 12/15/2021   PLT 263.0 12/15/2021   GLUCOSE 64 (L) 12/15/2021   CHOL 145 11/05/2021   TRIG 60 11/05/2021   HDL 63 11/05/2021   LDLDIRECT 138.1 05/19/2011   LDLCALC 68 11/05/2021   ALT 10 07/23/2021   AST 23 07/23/2021   NA 132 (L) 12/15/2021   K 4.4 12/15/2021   CL 96 12/15/2021   CREATININE 0.83 12/15/2021   BUN 18 12/15/2021   CO2 27 12/15/2021   TSH 2.670 11/16/2021   INR 1.04 09/04/2015   HGBA1C 5.9 12/18/2020    Assessment/Plan:  Balance problem - Plan: POCT urinalysis dipstick, CBC with Differential/Platelet, CMP, TSH, T4, Free, CT HEAD WO CONTRAST ( ), Ambulatory referral to Home Health  Vision changes - Plan: CBC with Differential/Platelet, CT HEAD WO CONTRAST ( ), Ambulatory referral to Home Health  Left-sided weakness - Plan: TSH, T4, Free, CT HEAD WO CONTRAST ( ), Ambulatory referral to Home Health  Muscle spasticity  Discussed continuing work-up for causes of balance issues, left upper extremity paresis, and LE weakness.  Patient seen by neurology and Ortho.  Thus far results have been negative for causes of symptoms.  Last CT head and MRI brain 10/08/2021: moderate predominantly central parenchymal volume loss with prominence of ventricular system stable but progressed since 2019.  Mild chronic white matter microangiopathy.  No acute intracranial pathology.  Given continued symptoms consider NPH, cervical myelopathy, Parkinson's as causes of gait disturbance.  Consider LP.  Patient and family encouraged to reach back out to neurology in regards to next steps versus considering second opinion.  F/u prn  Abbe Amsterdam, MD

## 2022-02-03 ENCOUNTER — Telehealth: Payer: Self-pay | Admitting: Family Medicine

## 2022-02-03 DIAGNOSIS — I2510 Atherosclerotic heart disease of native coronary artery without angina pectoris: Secondary | ICD-10-CM | POA: Diagnosis not present

## 2022-02-03 DIAGNOSIS — E871 Hypo-osmolality and hyponatremia: Secondary | ICD-10-CM | POA: Diagnosis not present

## 2022-02-03 DIAGNOSIS — I10 Essential (primary) hypertension: Secondary | ICD-10-CM | POA: Diagnosis not present

## 2022-02-03 DIAGNOSIS — K5730 Diverticulosis of large intestine without perforation or abscess without bleeding: Secondary | ICD-10-CM | POA: Diagnosis not present

## 2022-02-03 DIAGNOSIS — M62838 Other muscle spasm: Secondary | ICD-10-CM | POA: Diagnosis not present

## 2022-02-03 NOTE — Telephone Encounter (Signed)
Shanon Brow PT with [facility] is calling and would like VO for PT 1x1, 2x2 and then 1x6

## 2022-02-04 DIAGNOSIS — E871 Hypo-osmolality and hyponatremia: Secondary | ICD-10-CM | POA: Diagnosis not present

## 2022-02-04 DIAGNOSIS — I2510 Atherosclerotic heart disease of native coronary artery without angina pectoris: Secondary | ICD-10-CM | POA: Diagnosis not present

## 2022-02-04 DIAGNOSIS — M62838 Other muscle spasm: Secondary | ICD-10-CM | POA: Diagnosis not present

## 2022-02-04 DIAGNOSIS — I10 Essential (primary) hypertension: Secondary | ICD-10-CM | POA: Diagnosis not present

## 2022-02-04 DIAGNOSIS — K5730 Diverticulosis of large intestine without perforation or abscess without bleeding: Secondary | ICD-10-CM | POA: Diagnosis not present

## 2022-02-07 ENCOUNTER — Telehealth: Payer: Self-pay | Admitting: Family Medicine

## 2022-02-07 NOTE — Telephone Encounter (Signed)
Don OT with [facility] is calling and need VO for OT 1x2 skip one week  then 1x1 for adls, transfers, exercise health promotion

## 2022-02-08 DIAGNOSIS — K5730 Diverticulosis of large intestine without perforation or abscess without bleeding: Secondary | ICD-10-CM | POA: Diagnosis not present

## 2022-02-08 DIAGNOSIS — M62838 Other muscle spasm: Secondary | ICD-10-CM | POA: Diagnosis not present

## 2022-02-08 DIAGNOSIS — E871 Hypo-osmolality and hyponatremia: Secondary | ICD-10-CM | POA: Diagnosis not present

## 2022-02-08 DIAGNOSIS — I2510 Atherosclerotic heart disease of native coronary artery without angina pectoris: Secondary | ICD-10-CM | POA: Diagnosis not present

## 2022-02-08 DIAGNOSIS — I10 Essential (primary) hypertension: Secondary | ICD-10-CM | POA: Diagnosis not present

## 2022-02-09 ENCOUNTER — Telehealth: Payer: Self-pay | Admitting: Family Medicine

## 2022-02-09 NOTE — Telephone Encounter (Addendum)
Levada Dy with [name]  [phone]  Ok to leave a detailed message  OV notes state:  left sided weakness  cannot be used unless they know the etiology   Primary = Balance problems  Also needs etiology

## 2022-02-10 DIAGNOSIS — M62838 Other muscle spasm: Secondary | ICD-10-CM | POA: Diagnosis not present

## 2022-02-10 DIAGNOSIS — E871 Hypo-osmolality and hyponatremia: Secondary | ICD-10-CM | POA: Diagnosis not present

## 2022-02-10 DIAGNOSIS — I2510 Atherosclerotic heart disease of native coronary artery without angina pectoris: Secondary | ICD-10-CM | POA: Diagnosis not present

## 2022-02-10 DIAGNOSIS — I10 Essential (primary) hypertension: Secondary | ICD-10-CM | POA: Diagnosis not present

## 2022-02-10 DIAGNOSIS — K5730 Diverticulosis of large intestine without perforation or abscess without bleeding: Secondary | ICD-10-CM | POA: Diagnosis not present

## 2022-02-10 NOTE — Telephone Encounter (Signed)
Calling in to check on progress on this request, submitted on 03/05/2022

## 2022-02-11 DIAGNOSIS — I10 Essential (primary) hypertension: Secondary | ICD-10-CM | POA: Diagnosis not present

## 2022-02-11 DIAGNOSIS — E871 Hypo-osmolality and hyponatremia: Secondary | ICD-10-CM | POA: Diagnosis not present

## 2022-02-11 DIAGNOSIS — K5730 Diverticulosis of large intestine without perforation or abscess without bleeding: Secondary | ICD-10-CM | POA: Diagnosis not present

## 2022-02-11 DIAGNOSIS — M62838 Other muscle spasm: Secondary | ICD-10-CM | POA: Diagnosis not present

## 2022-02-11 DIAGNOSIS — I2510 Atherosclerotic heart disease of native coronary artery without angina pectoris: Secondary | ICD-10-CM | POA: Diagnosis not present

## 2022-02-11 NOTE — Telephone Encounter (Signed)
Levada Dy with [name] called again to follow up and states she really needs a response as soon as possible, in order to admit Pt.   Please advise.

## 2022-02-15 DIAGNOSIS — M62838 Other muscle spasm: Secondary | ICD-10-CM | POA: Diagnosis not present

## 2022-02-15 DIAGNOSIS — K5730 Diverticulosis of large intestine without perforation or abscess without bleeding: Secondary | ICD-10-CM | POA: Diagnosis not present

## 2022-02-15 DIAGNOSIS — E871 Hypo-osmolality and hyponatremia: Secondary | ICD-10-CM | POA: Diagnosis not present

## 2022-02-15 DIAGNOSIS — I10 Essential (primary) hypertension: Secondary | ICD-10-CM | POA: Diagnosis not present

## 2022-02-15 DIAGNOSIS — I2510 Atherosclerotic heart disease of native coronary artery without angina pectoris: Secondary | ICD-10-CM | POA: Diagnosis not present

## 2022-02-15 NOTE — Telephone Encounter (Signed)
Levada Dy with [name] called again this morning to follow up and states she really needs a response as soon as possible, in order to admit Pt.    Please advise.

## 2022-02-16 NOTE — Telephone Encounter (Signed)
ok 

## 2022-02-16 NOTE — Telephone Encounter (Signed)
[place] for VO.  Also please let Timmothy Sours know that this provider has been out of the office for the last few wks. This is not an urgent matter.

## 2022-02-17 NOTE — Telephone Encounter (Signed)
Spoke with Shanon Brow, gave the okay for VO, per Dr Volanda Napoleon.

## 2022-02-17 NOTE — Telephone Encounter (Signed)
Spoke with Timmothy Sours, gave okay for VO.

## 2022-02-21 DIAGNOSIS — M62838 Other muscle spasm: Secondary | ICD-10-CM | POA: Diagnosis not present

## 2022-02-21 DIAGNOSIS — E871 Hypo-osmolality and hyponatremia: Secondary | ICD-10-CM | POA: Diagnosis not present

## 2022-02-21 DIAGNOSIS — K5730 Diverticulosis of large intestine without perforation or abscess without bleeding: Secondary | ICD-10-CM | POA: Diagnosis not present

## 2022-02-21 DIAGNOSIS — I2510 Atherosclerotic heart disease of native coronary artery without angina pectoris: Secondary | ICD-10-CM | POA: Diagnosis not present

## 2022-02-21 DIAGNOSIS — I10 Essential (primary) hypertension: Secondary | ICD-10-CM | POA: Diagnosis not present

## 2022-02-23 DIAGNOSIS — I10 Essential (primary) hypertension: Secondary | ICD-10-CM | POA: Diagnosis not present

## 2022-02-23 DIAGNOSIS — K5730 Diverticulosis of large intestine without perforation or abscess without bleeding: Secondary | ICD-10-CM | POA: Diagnosis not present

## 2022-02-23 DIAGNOSIS — M62838 Other muscle spasm: Secondary | ICD-10-CM | POA: Diagnosis not present

## 2022-02-23 DIAGNOSIS — E871 Hypo-osmolality and hyponatremia: Secondary | ICD-10-CM | POA: Diagnosis not present

## 2022-02-23 DIAGNOSIS — I2510 Atherosclerotic heart disease of native coronary artery without angina pectoris: Secondary | ICD-10-CM | POA: Diagnosis not present

## 2022-02-23 NOTE — Telephone Encounter (Signed)
Felicia Cross with [name] called again, stating she has been waiting since 02/09/22 for the etiology and needs a call back as soon as possible.

## 2022-02-28 NOTE — Telephone Encounter (Signed)
Please advise Felicia Cross with [name] this provider has been out of the office when she has called.  Pt has muscle spasticity, history of traumatic hemorrhage of cerebrum, muscle weakness.

## 2022-03-01 ENCOUNTER — Telehealth: Payer: Self-pay | Admitting: Family Medicine

## 2022-03-01 NOTE — Telephone Encounter (Signed)
Patient returning call for results 

## 2022-03-01 NOTE — Telephone Encounter (Signed)
Felicia Cross is aware ?

## 2022-03-02 DIAGNOSIS — M62838 Other muscle spasm: Secondary | ICD-10-CM | POA: Diagnosis not present

## 2022-03-02 DIAGNOSIS — K5730 Diverticulosis of large intestine without perforation or abscess without bleeding: Secondary | ICD-10-CM | POA: Diagnosis not present

## 2022-03-02 DIAGNOSIS — E871 Hypo-osmolality and hyponatremia: Secondary | ICD-10-CM | POA: Diagnosis not present

## 2022-03-02 DIAGNOSIS — I2510 Atherosclerotic heart disease of native coronary artery without angina pectoris: Secondary | ICD-10-CM | POA: Diagnosis not present

## 2022-03-02 DIAGNOSIS — I10 Essential (primary) hypertension: Secondary | ICD-10-CM | POA: Diagnosis not present

## 2022-03-03 ENCOUNTER — Ambulatory Visit (INDEPENDENT_AMBULATORY_CARE_PROVIDER_SITE_OTHER): Payer: Medicare Other | Admitting: Family Medicine

## 2022-03-03 VITALS — BP 122/80 | HR 60 | Temp 98.8°F

## 2022-03-03 DIAGNOSIS — M62838 Other muscle spasm: Secondary | ICD-10-CM | POA: Diagnosis not present

## 2022-03-03 DIAGNOSIS — M25622 Stiffness of left elbow, not elsewhere classified: Secondary | ICD-10-CM

## 2022-03-03 DIAGNOSIS — R531 Weakness: Secondary | ICD-10-CM | POA: Diagnosis not present

## 2022-03-03 DIAGNOSIS — R2689 Other abnormalities of gait and mobility: Secondary | ICD-10-CM | POA: Diagnosis not present

## 2022-03-03 DIAGNOSIS — I10 Essential (primary) hypertension: Secondary | ICD-10-CM

## 2022-03-03 NOTE — Patient Instructions (Signed)
Referrals were placed for a second opinion from Ortho as well as home health personal care services.  You should expect phone calls about scheduling these appointments.  If not please let the office know.   I hope you have a very happy birthday tomorrow!!!

## 2022-03-03 NOTE — Progress Notes (Signed)
Subjective:    Patient ID: Felicia Cross, female    DOB: 1932/08/28, 88 y.o.   MRN: [id number]  Chief Complaint  Patient presents with   home health    Is getting the PT/OT from [facility] but is wanting to have an assistant to help with day to day living.   Pt accompanied by her daughter Lorita Officer [phone]  HPI Patient was seen today for visit for home health.  Per patient's daughter would like an aide to assist pt with getting dressed, meds, and personal care needs.  Has PT/OT currently for assistance and working on balance problems.  Patient states she is still weak on left side, but reports note improvement in strength.  Patient endorses doing exercises throughout the day while sitting.  Patient with continued decreased range of motion in left elbow and spasticity in the left hand limiting use.  Previously seen by Ortho and hand surgery.  Interested in second opinion.  In the past patient had referrals placed for various specialties however she declined services when contacted directly.  Patient's daughter requesting she be contacted in regards to scheduling appointments.  BP well controlled on current medications and with lifestyle modifications.  Past Medical History:  Diagnosis Date   Anemia 01/18/2016   -reports on and off her whole life, on oral iron in the past remotely per report   Arthritis    B12 deficiency 01/18/2016   CAD (coronary artery disease)    CVD (cerebrovascular disease)    Diverticulosis of colon    H. pylori infection 10/20/2015   Hyperlipidemia    Hypertension    Osteoarthritis 03/07/2006   Qualifier: Diagnosis of  By: Leanne Chang MD, Bruce      Allergies  Allergen Reactions   Codeine Nausea And Vomiting   Hydrocodone Nausea And Vomiting    ROS General: Denies fever, chills, night sweats, changes in weight, changes in appetite HEENT: Denies headaches, ear pain, changes in vision, rhinorrhea, sore throat CV: Denies CP, palpitations, SOB, orthopnea Pulm: Denies  SOB, cough, wheezing GI: Denies abdominal pain, nausea, vomiting, diarrhea, constipation GU: Denies dysuria, hematuria, frequency, vaginal discharge Msk: Denies muscle cramps, joint pains + decreased use of the LUE Neuro: Denies weakness, numbness, tingling + balance concerns Skin: Denies rashes, bruising Psych: Denies depression, anxiety, hallucinations     Objective:    Blood pressure 122/80, pulse 60, temperature 98.8 F (37.1 C), temperature source Oral, SpO2 97 %.   Gen. Pleasant, well-nourished, in no distress, normal affect   HEENT: [place]/AT, face symmetric, conjunctiva clear, no scleral icterus, PERRLA, EOMI, nares patent without drainage Lungs: no accessory muscle use, CTAB, no wheezes or rales Cardiovascular: RRR, no m/r/g, no peripheral edema Musculoskeletal: Normal strength in bilateral LEs no deformities, no cyanosis or clubbing, normal tone Neuro:  A&Ox3, CN II-XII intact, gait not assessed as sitting in transport wheelchair. Skin:  Warm, no lesions/ rash   Wt Readings from Last 3 Encounters:  01/19/22 150 lb (68 kg)  01/04/22 150 lb 9.6 oz (68.3 kg)  12/13/21 150 lb (68 kg)    Lab Results  Component Value Date   WBC 4.5 01/19/2022   HGB 12.1 01/19/2022   HCT 35.8 (L) 01/19/2022   PLT 244.0 01/19/2022   GLUCOSE 77 01/19/2022   CHOL 145 11/05/2021   TRIG 60 11/05/2021   HDL 63 11/05/2021   LDLDIRECT 138.1 05/19/2011   LDLCALC 68 11/05/2021   ALT 12 01/19/2022   AST 20 01/19/2022   NA 132 (L) 01/19/2022  K 4.2 01/19/2022   CL 97 01/19/2022   CREATININE 1.01 01/19/2022   BUN 19 01/19/2022   CO2 26 01/19/2022   TSH 2.45 01/19/2022   INR 1.04 09/04/2015   HGBA1C 5.9 12/18/2020    Assessment/Plan:  Essential hypertension -Controlled -Continue current medications including losartan 100 mg daily, Lopressor 25 mg twice daily, spironolactone 25 mg daily - Plan: Ambulatory referral to Home Health  Balance problem -Strength of LLE improving -Continue  PT/OT and exercises learned.  - Plan: Ambulatory referral to Home Health  Muscle spasticity -Patient interested in second opinion for left hand as it is more of a recent problem within the last few months - Plan: Ambulatory referral to Home Health, Ambulatory referral to Orthopedic Surgery  Left-sided weakness  - Plan: Ambulatory referral to Home Health  Decreased range of motion of elbow, left  - Plan: Ambulatory referral to Home Health  F/u as needed  Abbe Amsterdam, MD

## 2022-03-07 NOTE — Telephone Encounter (Signed)
Discussed.

## 2022-03-10 DIAGNOSIS — I2510 Atherosclerotic heart disease of native coronary artery without angina pectoris: Secondary | ICD-10-CM | POA: Diagnosis not present

## 2022-03-10 DIAGNOSIS — M62838 Other muscle spasm: Secondary | ICD-10-CM | POA: Diagnosis not present

## 2022-03-10 DIAGNOSIS — K5730 Diverticulosis of large intestine without perforation or abscess without bleeding: Secondary | ICD-10-CM | POA: Diagnosis not present

## 2022-03-10 DIAGNOSIS — I10 Essential (primary) hypertension: Secondary | ICD-10-CM | POA: Diagnosis not present

## 2022-03-10 DIAGNOSIS — E871 Hypo-osmolality and hyponatremia: Secondary | ICD-10-CM | POA: Diagnosis not present

## 2022-03-16 ENCOUNTER — Encounter: Payer: Self-pay | Admitting: Family Medicine

## 2022-03-16 DIAGNOSIS — I2510 Atherosclerotic heart disease of native coronary artery without angina pectoris: Secondary | ICD-10-CM | POA: Diagnosis not present

## 2022-03-16 DIAGNOSIS — M62838 Other muscle spasm: Secondary | ICD-10-CM | POA: Diagnosis not present

## 2022-03-16 DIAGNOSIS — I10 Essential (primary) hypertension: Secondary | ICD-10-CM | POA: Diagnosis not present

## 2022-03-16 DIAGNOSIS — K5730 Diverticulosis of large intestine without perforation or abscess without bleeding: Secondary | ICD-10-CM | POA: Diagnosis not present

## 2022-03-16 DIAGNOSIS — E871 Hypo-osmolality and hyponatremia: Secondary | ICD-10-CM | POA: Diagnosis not present

## 2022-03-19 ENCOUNTER — Other Ambulatory Visit: Payer: Self-pay | Admitting: Family Medicine

## 2022-03-21 ENCOUNTER — Ambulatory Visit: Payer: Medicare Other | Admitting: Neurology

## 2022-03-21 ENCOUNTER — Encounter: Payer: Self-pay | Admitting: Neurology

## 2022-03-21 ENCOUNTER — Ambulatory Visit (INDEPENDENT_AMBULATORY_CARE_PROVIDER_SITE_OTHER): Payer: Medicare Other | Admitting: Neurology

## 2022-03-21 VITALS — BP 151/76 | HR 66 | Ht 64.0 in

## 2022-03-21 DIAGNOSIS — F0150 Vascular dementia without behavioral disturbance: Secondary | ICD-10-CM | POA: Diagnosis not present

## 2022-03-21 DIAGNOSIS — F0280 Dementia in other diseases classified elsewhere without behavioral disturbance: Secondary | ICD-10-CM

## 2022-03-21 DIAGNOSIS — G309 Alzheimer's disease, unspecified: Secondary | ICD-10-CM

## 2022-03-21 DIAGNOSIS — R413 Other amnesia: Secondary | ICD-10-CM | POA: Diagnosis not present

## 2022-03-21 NOTE — Progress Notes (Signed)
[facility] [address] [place]. [place] [address] [phone]       OFFICE VISIT NOTE NOTE  [name] Date of Birth:  Apr 27, 1933 Medical Record Number:  [id number]   Referring MD:  Dr Jerral Ralph  Reason for Referral: Involuntary movement ? seizure  HPI: Initial visit 11/16/2021 :Felicia Cross is a 88 year old pleasant African-American lady seen today for initial office consultation visit.  She is accompanied by 2 of her daughters as well as her caregiver.  History is obtained from them as well as review of electronic medical records and opossum reviewed pertinent available imaging films in PACS. She has past medical history of coronary artery disease, anemia, B12 deficiency, diverticulosis, hypertension, hyperlipidemia, stroke and osteoarthritis.  Patient has history of gait difficulties and poor balance and presented on 07/24/2021 to [facility] with unwitnessed fall in the kitchen.  She was found to have a large bump on the left frontal region but was awake alert and talkative and answering questions when paramedics arrived.  When evaluated in the ED she developed tightness of the left arm moving it closer to her chest and she could not let it go.  She has a known history of left elbow fracture which she was and had some chronic stiffness and shoulder limited elevation in the past but nothing like this.  She was started on Keppra presented seizures but EEG was obtained subsequently which showed no seizure activity.  MRI scan of the brain was obtained which showed tiny left occipital on intraventricular hemorrhage.  MRI scan C-spine showed mild posttraumatic ligamentous injury of C1-C2 but no fracture or spinal cord compression.  Patient since then has continued stiffness of the left upper extremity which she holds in the partially flexed position at the elbow and wrist close to her chest.  She can barely relaxes.  She is continues to have walking and balance  problems.  She uses a walker but family does not trust her and somebody has to be close to her.  Her balance is poor.  She has recently finished home physical and occupational therapy last week.  On inquiry the family admits that she may be having some short-term memory difficulties but they have not paid much attention to this and not had this evaluated.  Mini-Mental status exam today she scored 24/30 with poor recall, poor visual perception skills and copying figures.  She has no prior history of seizures, significant head injury with loss of consciousness,.  Past medical history of stroke is listed but patient or family is unable to give any specific details.  She is currently on aspirin.  She has not had any recent stroke or TIA symptoms. Update 03/21/2022 : She returns for follow-up after last visit 4 months ago.  She is accompanied by her aide.  Patient is tolerating Namenda well without any side effects.  She feels this may have helped her cognitively.  On objective testing in the office today she scored 24/30 which is unchanged from last visit.  Geriatric depression scale he scored 2 which is not suggestive of depression.  He continues to live at home with her husband and grandkids.  She does have activities during the day from 02/05/2029.  He had lab work done at last visit on 11/16/2021 which showed normal vitamin B12, TSH and RPR as well as homocystine EEG done on 12/01/2021 showed mild generalized slowing no definite epileptiform.  Pending today. ROS:   14 system review of systems is positive  for memory loss, poor balance, increased falls, left elbow and shoulder pain with limited movements.  Freezing gait, gait apraxia.  PMH:  Past Medical History:  Diagnosis Date   Anemia 01/18/2016   -reports on and off her whole life, on oral iron in the past remotely per report   Arthritis    B12 deficiency 01/18/2016   CAD (coronary artery disease)    CVD (cerebrovascular disease)    Diverticulosis of  colon    H. pylori infection 10/20/2015   Hyperlipidemia    Hypertension    Osteoarthritis 03/07/2006   Qualifier: Diagnosis of  By: Cato Mulligan MD, Bruce      Social History:  Social History   Socioeconomic History   Marital status: Married    Spouse name: Not on file   Number of children: Not on file   Years of education: Not on file   Highest education level: Not on file  Occupational History   Not on file  Tobacco Use   Smoking status: Former    Packs/day: 1.00    Years: 30.00    Total pack years: 30.00    Types: Cigarettes    Quit date: 11/01/1980    Years since quitting: 41.4   Smokeless tobacco: Never  Vaping Use   Vaping Use: Never used  Substance and Sexual Activity   Alcohol use: Not Currently    Comment: glass of wine   Drug use: No   Sexual activity: Not on file  Other Topics Concern   Not on file  Social History Narrative   Updated 01/07/15   Work or School: retired from Geneticist, molecular, 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 [place]      Home 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, Information systems manager and two great grandchildren age 72 and 56      Spiritual Beliefs: Christian      Lifestyle: regular exercise at the [facility]; healthy diet      Social Determinants of Health   Financial Resource Strain: Low Risk  (12/13/2021)   Overall Financial Resource Strain (CARDIA)    Difficulty of Paying Living Expenses: Not hard at all  Food Insecurity: No Food Insecurity (12/13/2021)   Hunger Vital Sign    Worried About Running Out of Food in the Last Year: Never true    Ran Out of Food in the Last Year: Never true  Transportation Needs: No Transportation Needs (12/13/2021)   PRAPARE - Administrator, Civil Service (Medical): No    Lack of Transportation (Non-Medical): No  Physical Activity: Inactive (12/13/2021)   Exercise Vital Sign    Days of Exercise per Week: 0 days    Minutes of Exercise per Session: 0 min  Stress: No Stress Concern Present (12/13/2021)   [employer] of Occupational  Health - Occupational Stress Questionnaire    Feeling of Stress : Not at all  Social Connections: Not on file  Intimate Partner Violence: Not on file    Medications:   Current Outpatient Medications on File Prior to Visit  Medication Sig Dispense Refill   aspirin EC 81 MG tablet Take 1 tablet (81 mg total) by mouth daily. (Patient taking differently: Take 81 mg by mouth every other day.)     losartan (COZAAR) 100 MG tablet Take 1 tablet (100 mg total) by mouth daily. (Patient taking differently: Take 100 mg by mouth daily. Take 1/2 tab in the morning and 1/2 tab in the evening) 90 tablet 1   acetaminophen (TYLENOL) 325 MG tablet Take 975 mg  by mouth See admin instructions. Take 3 tablets (975 mg) by mouth three times daily, may also take 2 tablets (650 mg) by mouth every 6 hours as needed for moderate pain/headache     Blood Pressure Monitoring (BLOOD PRESSURE CUFF) MISC Use as directed for checking bp daily. 1 each 0   levETIRAcetam (KEPPRA) 500 MG tablet Keppra 500 mg twice daily x6 weeks, then to 250mg  BID x 1 week     memantine (NAMENDA TITRATION PAK) tablet pack 5 mg/day for =1 week; 5 mg twice daily for =1 week; 15 mg/day given in 5 mg and 10 mg separated doses for =1 week; then 10 mg twice daily 49 tablet 12   memantine (NAMENDA) 10 MG tablet Take 1 tablet (10 mg total) by mouth 2 (two) times daily. 60 tablet 3   metoprolol tartrate (LOPRESSOR) 25 MG tablet Take 1 tablet by mouth twice daily 180 tablet 0   Multiple Vitamin-Folic Acid TABS Take 1 tablet by mouth every morning.     simvastatin (ZOCOR) 40 MG tablet Take 1 tablet (40 mg total) by mouth daily. 46 tablet 3   spironolactone (ALDACTONE) 25 MG tablet Take 1 tablet (25 mg total) by mouth daily. 90 tablet 3   vitamin B-12 (CYANOCOBALAMIN) 1000 MCG tablet Take 1 tablet (1,000 mcg total) by mouth daily.     No current facility-administered medications on file prior to visit.    Allergies:   Allergies  Allergen Reactions    Codeine Nausea And Vomiting   Hydrocodone Nausea And Vomiting    Physical Exam General: well developed, well nourished pleasant elderly African-American lady, seated, in no evident distress Head: head normocephalic and atraumatic.   Neck: supple with no carotid or supraclavicular bruits Cardiovascular: regular rate and rhythm, no murmurs Musculoskeletal: no deformity Skin:  no rash/petichiae Vascular:  Normal pulses all extremities  Neurologic Exam Mental Status: Awake and fully alert. Oriented to place and time. Recent and remote memory diminished attention span, concentration and fund of knowledge appropriate. Mood and affect appropriate.  Mini-Mental status exam score 24/30 with deficits in attention, calculation, recall.  Unable to copy intersecting pentagons.  Clock drawing 3/4.  Geriatric depression scale 2 not depressed.  Able to name  10 animals which can walk on 4 legs. Cranial Nerves: Fundoscopic exam reveals sharp disc margins. Pupils equal, briskly reactive to light. Extraocular movements full without nystagmus. Visual fields full to confrontation. Hearing intact. Facial sensation intact. Face, tongue, palate moves normally and symmetrically.  Motor: Normal bulk and tone. Normal strength in all tested extremity muscles upper extremity movements restricted due to mechanical restriction of the left shoulder increased tone and old fracture of the left elbow . Sensory.: intact to touch , pinprick , position and vibratory sensation.  Coordination: Rapid alternating movements normal in all extremities. Finger-to-nose and heel-to-shin performed accurately bilaterally. Gait and Station: Arises from chair without difficulty. Stance is normal. Gait demonstrates normal stride length and balance . Able to heel, toe and tandem walk without difficulty.  Reflexes: 2+ and symmetric. Toes downgoing.        03/21/2022    1:38 PM 11/16/2021   10:04 AM  MMSE - Mini Mental State Exam  Orientation  to time 5 5  Orientation to Place 4 5  Registration 3 3  Attention/ Calculation 5 3  Recall 0 0  Language- name 2 objects 2 2  Language- repeat 0 1  Language- follow 3 step command 3 3  Language- read & follow direction  1 1  Write a sentence 1 1  Copy design 0 0  Total score 24 24        ASSESSMENT: 88 year old lady with history of gait abnormality and frequent falls as well as cognitive impairment likely due to mild dementia which appears stable on current dose of Namenda.        PLAN: II had a long discussion with the patient and her aide regarding her mild dementia which seems to be stable on Namenda.  Recommend she continue Namenda 10 mg twice daily and increase participation in cognitively challenging activities like solving crossword puzzles, playing bridge and sudoku.  We also discussed memory compensation strategies.  Continue aspirin for stroke prevention and maintain aggressive risk factor modification with strict control of hypertension with blood pressure goal below 140/90, lipids with LDL cholesterol goal below 70 mg and diabetes with hemoglobin A1c goal below 6.5%.  Return for follow-up in the future in 6 months or call earlier if necessary. Greater than 50% time during this 35-minute   visit was spent on counseling and coordination of care about her falls and cognitive decline and dementia and answering questions Delia Heady, MD Note: This document was prepared with digital dictation and possible smart phrase technology. Any transcriptional errors that result from this process are unintentional.

## 2022-03-21 NOTE — Patient Instructions (Signed)
I had a long discussion with the patient and her aide regarding her mild dementia which seems to be stable on Namenda.  Recommend she continue Namenda 10 mg twice daily and increase participation in cognitively challenging activities like solving crossword puzzles, playing bridge and sudoku.  We also discussed memory compensation strategies.  Continue aspirin for stroke prevention and maintain aggressive risk factor modification with strict control of hypertension with blood pressure goal below 140/90, lipids with LDL cholesterol goal below 70 mg and diabetes with hemoglobin A1c goal below 6.5%.  Return for follow-up in the future in 6 months or call earlier if necessary.  Stroke Prevention Some medical conditions and behaviors can lead to a higher chance of having a stroke. You can help prevent a stroke by eating healthy, exercising, not smoking, and managing any medical conditions you have. Stroke is a leading cause of functional impairment. Primary prevention is particularly important because a majority of strokes are first-time events. Stroke changes the lives of not only those who experience a stroke but also their family and other caregivers. How can this condition affect me? A stroke is a medical emergency and should be treated right away. A stroke can lead to brain damage and can sometimes be life-threatening. If a person gets medical treatment right away, there is a better chance of surviving and recovering from a stroke. What can increase my risk? The following medical conditions may increase your risk of a stroke: Cardiovascular disease. High blood pressure (hypertension). Diabetes. High cholesterol. Sickle cell disease. Blood clotting disorders (hypercoagulable state). Obesity. Sleep disorders (obstructive sleep apnea). Other risk factors include: Being older than age 37. Having a history of blood clots, stroke, or mini-stroke (transient ischemic attack, TIA). Genetic factors, such as  race, ethnicity, or a family history of stroke. Smoking cigarettes or using other tobacco products. Taking birth control pills, especially if you also use tobacco. Heavy use of alcohol or drugs, especially cocaine and methamphetamine. Physical inactivity. What actions can I take to prevent this? Manage your health conditions High cholesterol levels. Eating a healthy diet is important for preventing high cholesterol. If cholesterol cannot be managed through diet alone, you may need to take medicines. Take any prescribed medicines to control your cholesterol as told by your health care provider. Hypertension. To reduce your risk of stroke, try to keep your blood pressure below 130/80. Eating a healthy diet and exercising regularly are important for controlling blood pressure. If these steps are not enough to manage your blood pressure, you may need to take medicines. Take any prescribed medicines to control hypertension as told by your health care provider. Ask your health care provider if you should monitor your blood pressure at home. Have your blood pressure checked every year, even if your blood pressure is normal. Blood pressure increases with age and some medical conditions. Diabetes. Eating a healthy diet and exercising regularly are important parts of managing your blood sugar (glucose). If your blood sugar cannot be managed through diet and exercise, you may need to take medicines. Take any prescribed medicines to control your diabetes as told by your health care provider. Get evaluated for obstructive sleep apnea. Talk to your health care provider about getting a sleep evaluation if you snore a lot or have excessive sleepiness. Make sure that any other medical conditions you have, such as atrial fibrillation or atherosclerosis, are managed. Nutrition Follow instructions from your health care provider about what to eat or drink to help manage your health condition. These  instructions may  include: Reducing your daily calorie intake. Limiting how much salt (sodium) you use to 1,500 milligrams (mg) each day. Using only healthy fats for cooking, such as olive oil, canola oil, or sunflower oil. Eating healthy foods. You can do this by: Choosing foods that are high in fiber, such as whole grains, and fresh fruits and vegetables. Eating at least 5 servings of fruits and vegetables a day. Try to fill one-half of your plate with fruits and vegetables at each meal. Choosing lean protein foods, such as lean cuts of meat, poultry without skin, fish, tofu, beans, and nuts. Eating low-fat dairy products. Avoiding foods that are high in sodium. This can help lower blood pressure. Avoiding foods that have saturated fat, trans fat, and cholesterol. This can help prevent high cholesterol. Avoiding processed and prepared foods. Counting your daily carbohydrate intake.  Lifestyle If you drink alcohol: Limit how much you have to: 0-1 drink a day for women who are not pregnant. 0-2 drinks a day for men. Know how much alcohol is in your drink. In the [place] one drink equals one 12 oz bottle of beer ( ), one 5 oz glass of wine ( ), or one 1 oz glass of hard liquor (37mL). Do not use any products that contain nicotine or tobacco. These products include cigarettes, chewing tobacco, and vaping devices, such as e-cigarettes. If you need help quitting, ask your health care provider. Avoid secondhand smoke. Do not use drugs. Activity  Try to stay at a healthy weight. Get at least 30 minutes of exercise on most days, such as: Fast walking. Biking. Swimming. Medicines Take over-the-counter and prescription medicines only as told by your health care provider. Aspirin or blood thinners (antiplatelets or anticoagulants) may be recommended to reduce your risk of forming blood clots that can lead to stroke. Avoid taking birth control pills. Talk to your health care provider about the risks of  taking birth control pills if: You are over 52 years old. You smoke. You get very bad headaches. You have had a blood clot. Where to find more information American Stroke Association: www.strokeassociation.org Get help right away if: You or a loved one has any symptoms of a stroke. "BE FAST" is an easy way to remember the main warning signs of a stroke: B - Balance. Signs are dizziness, sudden trouble walking, or loss of balance. E - Eyes. Signs are trouble seeing or a sudden change in vision. F - Face. Signs are sudden weakness or numbness of the face, or the face or eyelid drooping on one side. A - Arms. Signs are weakness or numbness in an arm. This happens suddenly and usually on one side of the body. S - Speech. Signs are sudden trouble speaking, slurred speech, or trouble understanding what people say. T - Time. Time to call emergency services. Write down what time symptoms started. You or a loved one has other signs of a stroke, such as: A sudden, severe headache with no known cause. Nausea or vomiting. Seizure. These symptoms may represent a serious problem that is an emergency. Do not wait to see if the symptoms will go away. Get medical help right away. Call your local emergency services (911 in the [place] Do not drive yourself to the hospital. Summary You can help to prevent a stroke by eating healthy, exercising, not smoking, limiting alcohol intake, and managing any medical conditions you may have. Do not use any products that contain nicotine or tobacco. These include cigarettes, chewing tobacco,  and vaping devices, such as e-cigarettes. If you need help quitting, ask your health care provider. Remember "BE FAST" for warning signs of a stroke. Get help right away if you or a loved one has any of these signs. This information is not intended to replace advice given to you by your health care provider. Make sure you discuss any questions you have with your health care  provider. Document Revised: 11/25/2019 Document Reviewed: 11/25/2019 Elsevier Patient Education  2023 [employer].

## 2022-03-23 DIAGNOSIS — K5730 Diverticulosis of large intestine without perforation or abscess without bleeding: Secondary | ICD-10-CM | POA: Diagnosis not present

## 2022-03-23 DIAGNOSIS — I2510 Atherosclerotic heart disease of native coronary artery without angina pectoris: Secondary | ICD-10-CM | POA: Diagnosis not present

## 2022-03-23 DIAGNOSIS — I10 Essential (primary) hypertension: Secondary | ICD-10-CM | POA: Diagnosis not present

## 2022-03-23 DIAGNOSIS — M62838 Other muscle spasm: Secondary | ICD-10-CM | POA: Diagnosis not present

## 2022-03-23 DIAGNOSIS — E871 Hypo-osmolality and hyponatremia: Secondary | ICD-10-CM | POA: Diagnosis not present

## 2022-03-30 DIAGNOSIS — E871 Hypo-osmolality and hyponatremia: Secondary | ICD-10-CM | POA: Diagnosis not present

## 2022-03-30 DIAGNOSIS — I10 Essential (primary) hypertension: Secondary | ICD-10-CM | POA: Diagnosis not present

## 2022-03-30 DIAGNOSIS — M62838 Other muscle spasm: Secondary | ICD-10-CM | POA: Diagnosis not present

## 2022-03-30 DIAGNOSIS — I2510 Atherosclerotic heart disease of native coronary artery without angina pectoris: Secondary | ICD-10-CM | POA: Diagnosis not present

## 2022-03-30 DIAGNOSIS — K5730 Diverticulosis of large intestine without perforation or abscess without bleeding: Secondary | ICD-10-CM | POA: Diagnosis not present

## 2022-04-11 IMAGING — DX DG FINGER INDEX 2+V*R*
1 series · 5 of 5 positions shown · non-contrast
Comparison: Hand radiograph earlier today.

CLINICAL DATA: Post reduction

EXAM:
RIGHT INDEX FINGER 2+V

[Series 1: finger · 0.14mm/px · 5 of 5 slices shown]
[im 1/5]
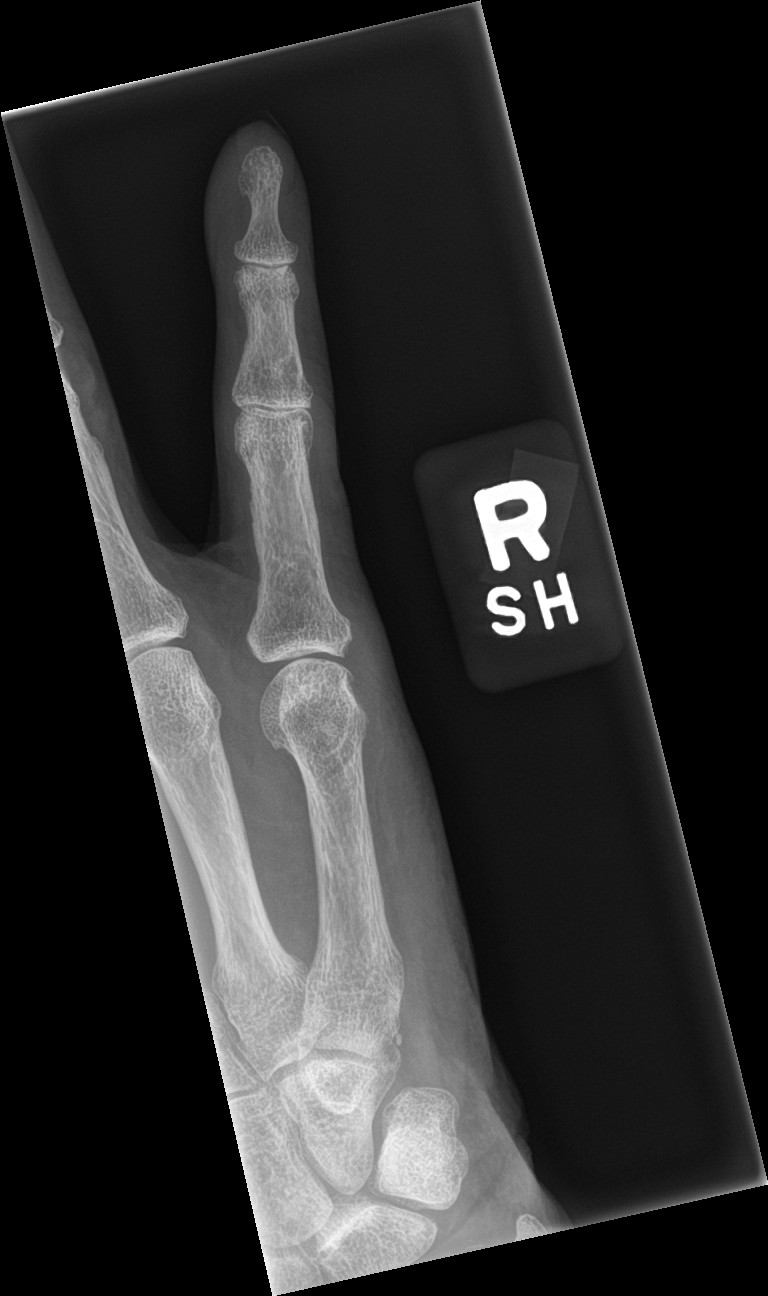
[im 2/5]
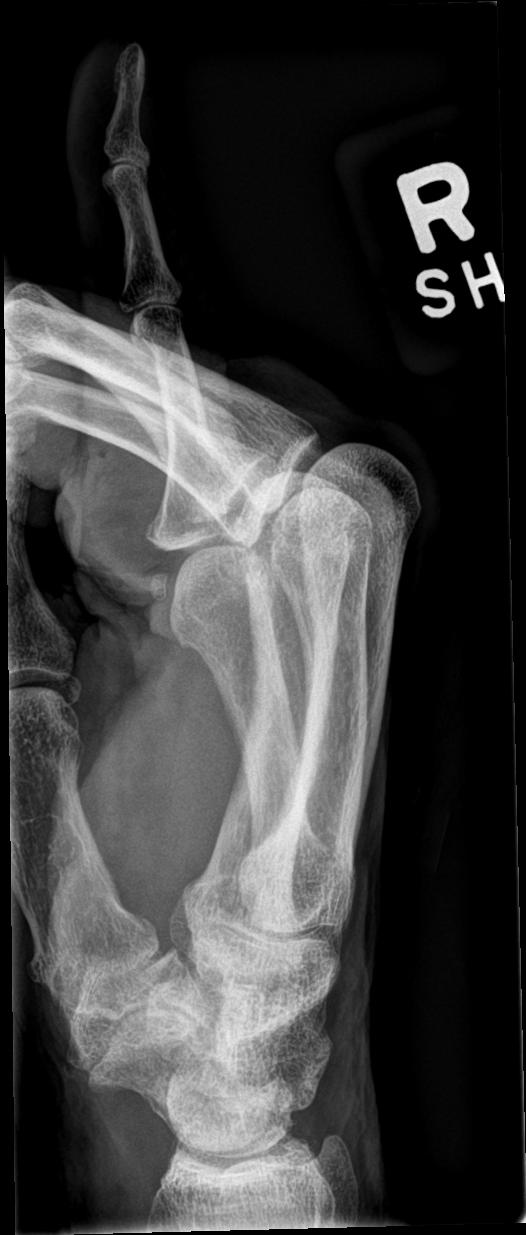
[im 3/5]
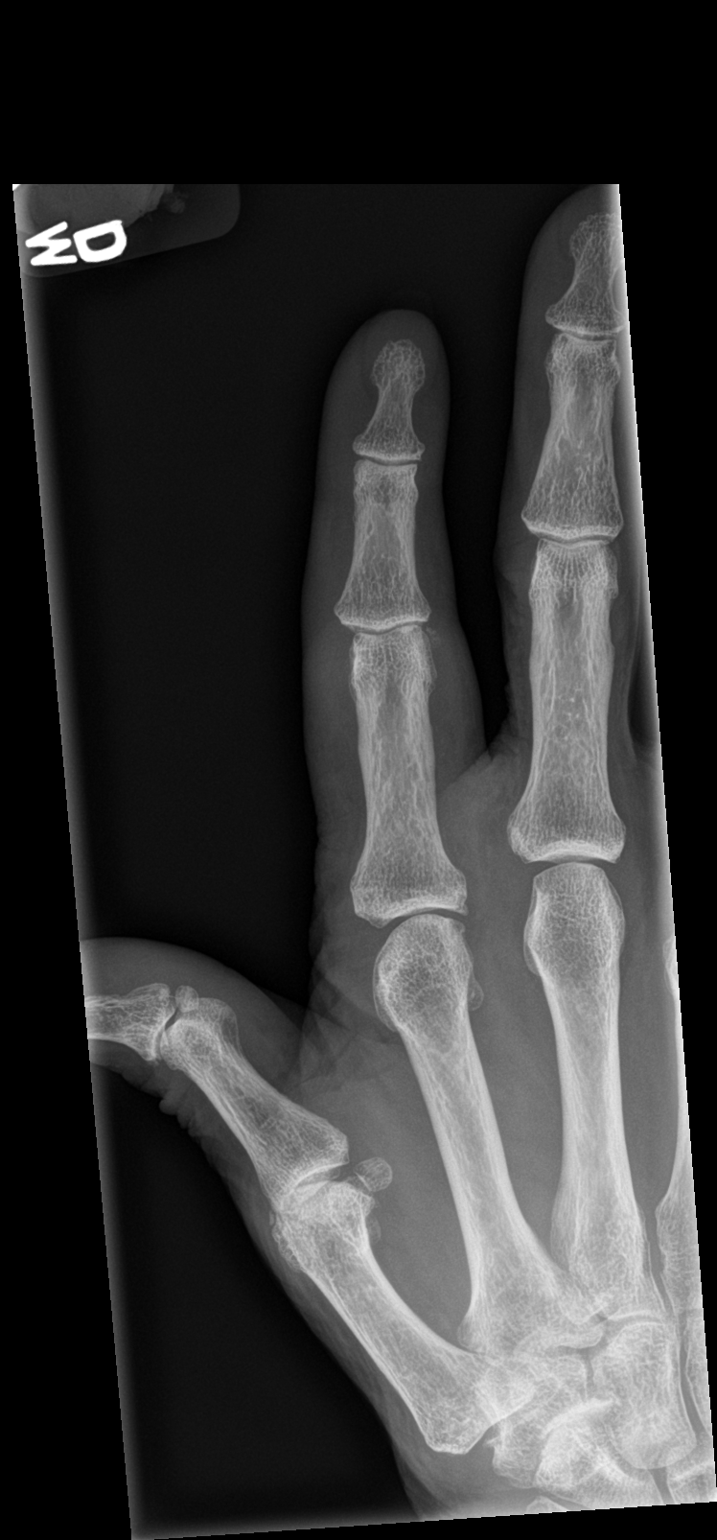
[im 4/5]
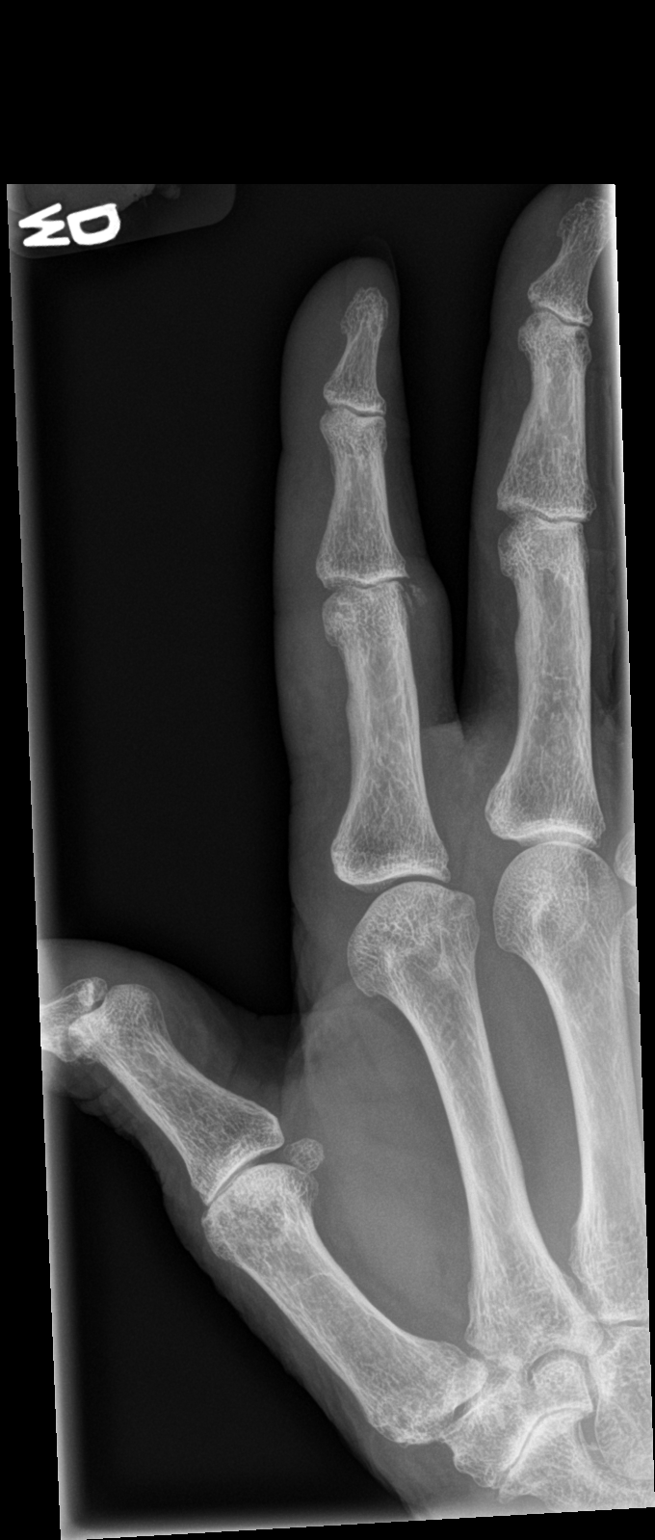
[im 5/5]
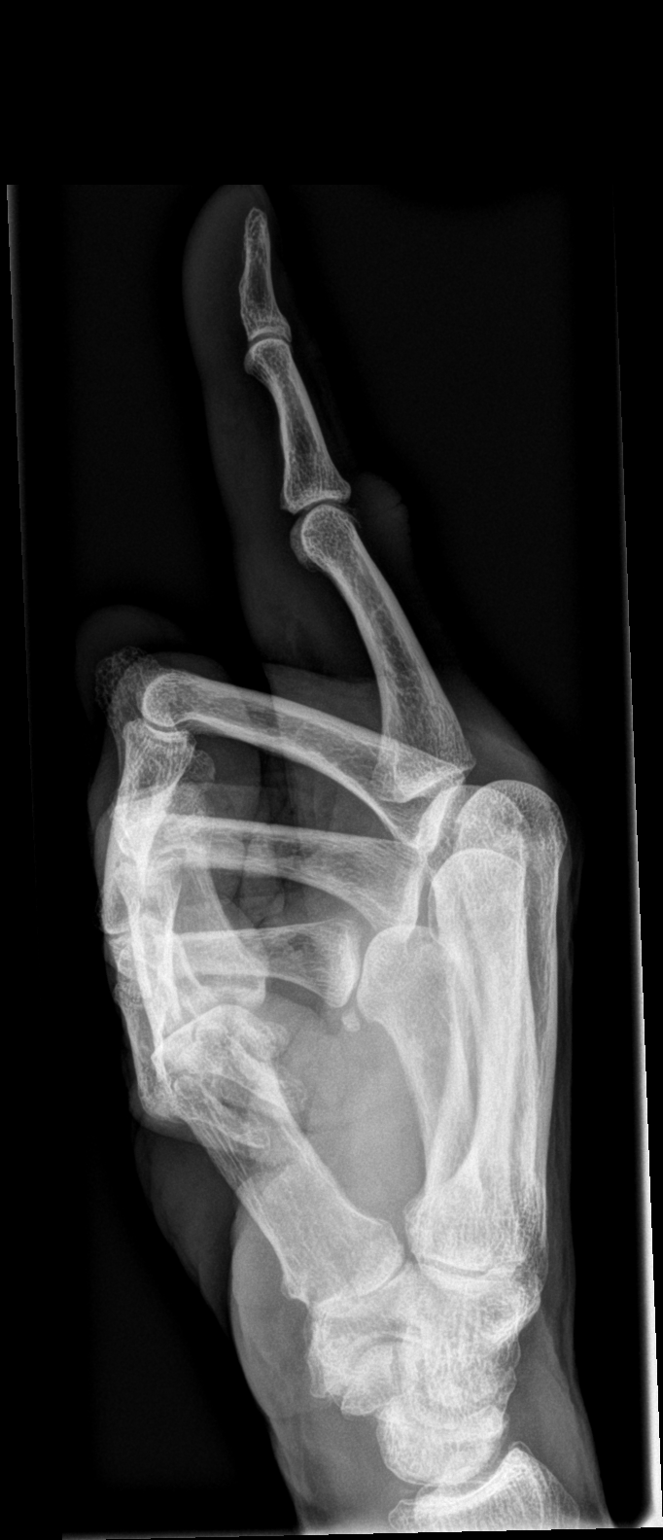

[5 of 5 positions shown; findings below may reference images not displayed]

FINDINGS: Please note the fifth digit was also image in invert [place] in addition
to the index finger. The previous dorsal dislocation of the index
finger at the proximal interphalangeal joint has been reduced.
Alignment is now anatomic. Small calcified density projects about
the ulnar aspect of the proximal phalanx, possible fracture fragment
with donor site uncertain. Mild soft tissue edema.

The imaged fifth digit appears intact.
IMPRESSION: Successful reduction of previous index finger dislocation. Small
calcified density projects about the ulnar aspect of the proximal
phalanx, possible fracture fragment with donor site uncertain.

## 2022-04-11 IMAGING — DX DG SHOULDER 2+V*L*
1 series · 3 of 3 positions shown · non-contrast
Comparison: None.

CLINICAL DATA: Left shoulder pain following fall

EXAM:
LEFT SHOULDER - 2+ VIEW

[Series 1: shoulder · 0.14mm/px · 3 of 3 slices shown]
[im 1/3]
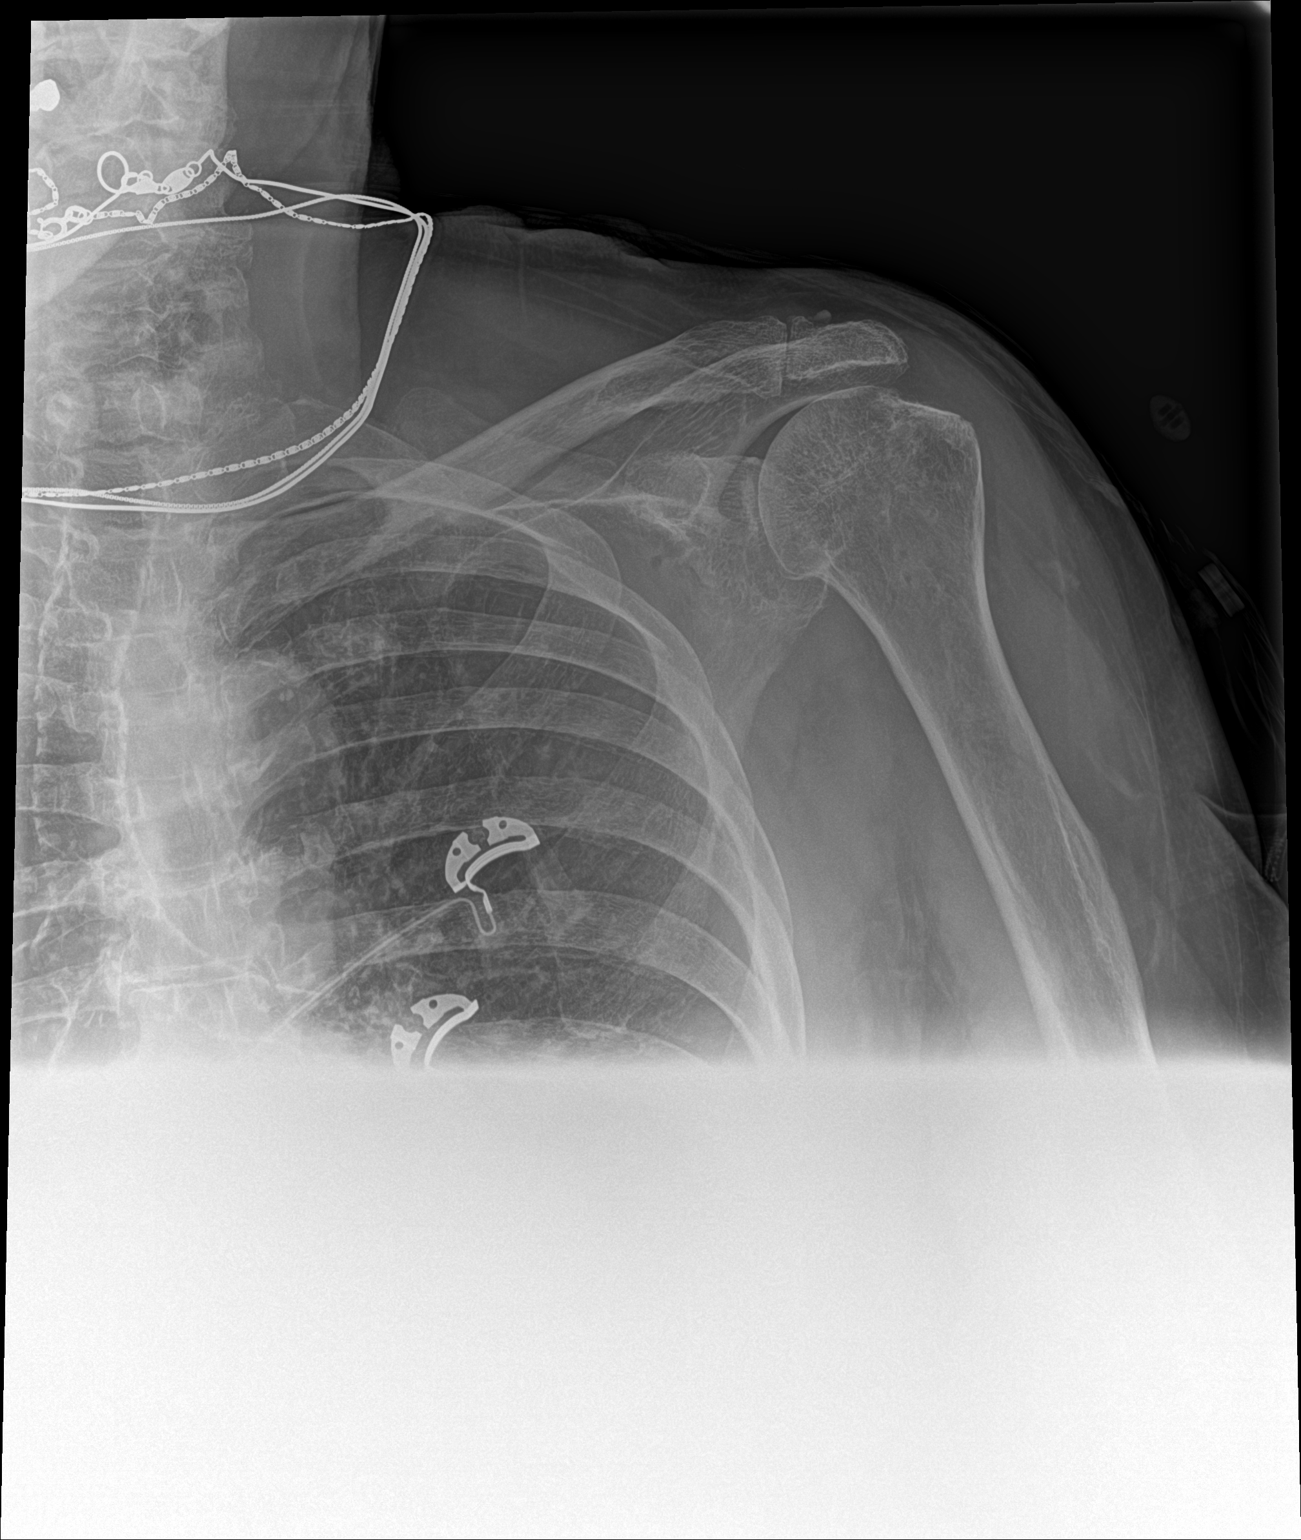
[im 2/3]
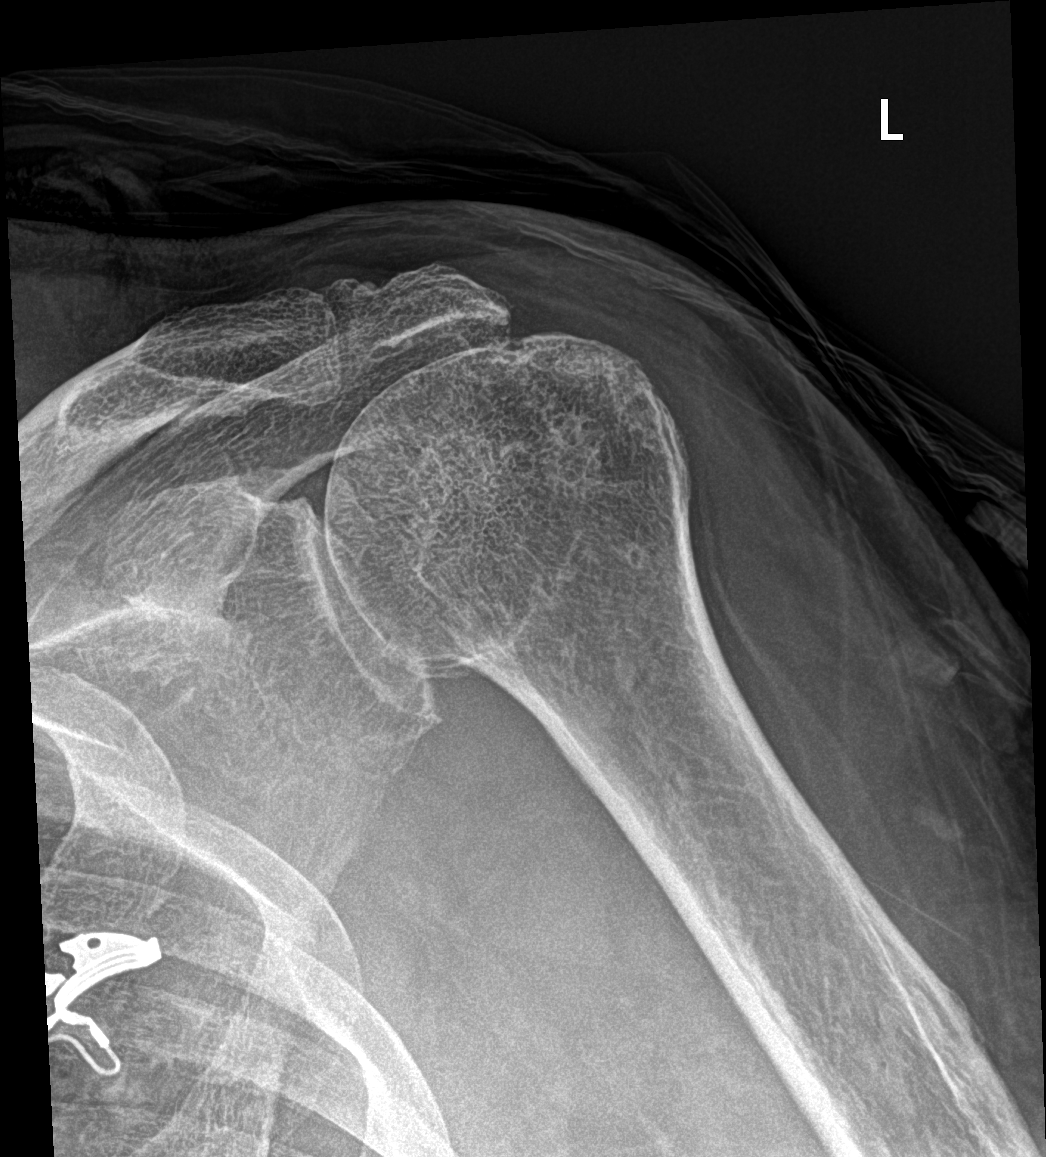
[im 3/3]
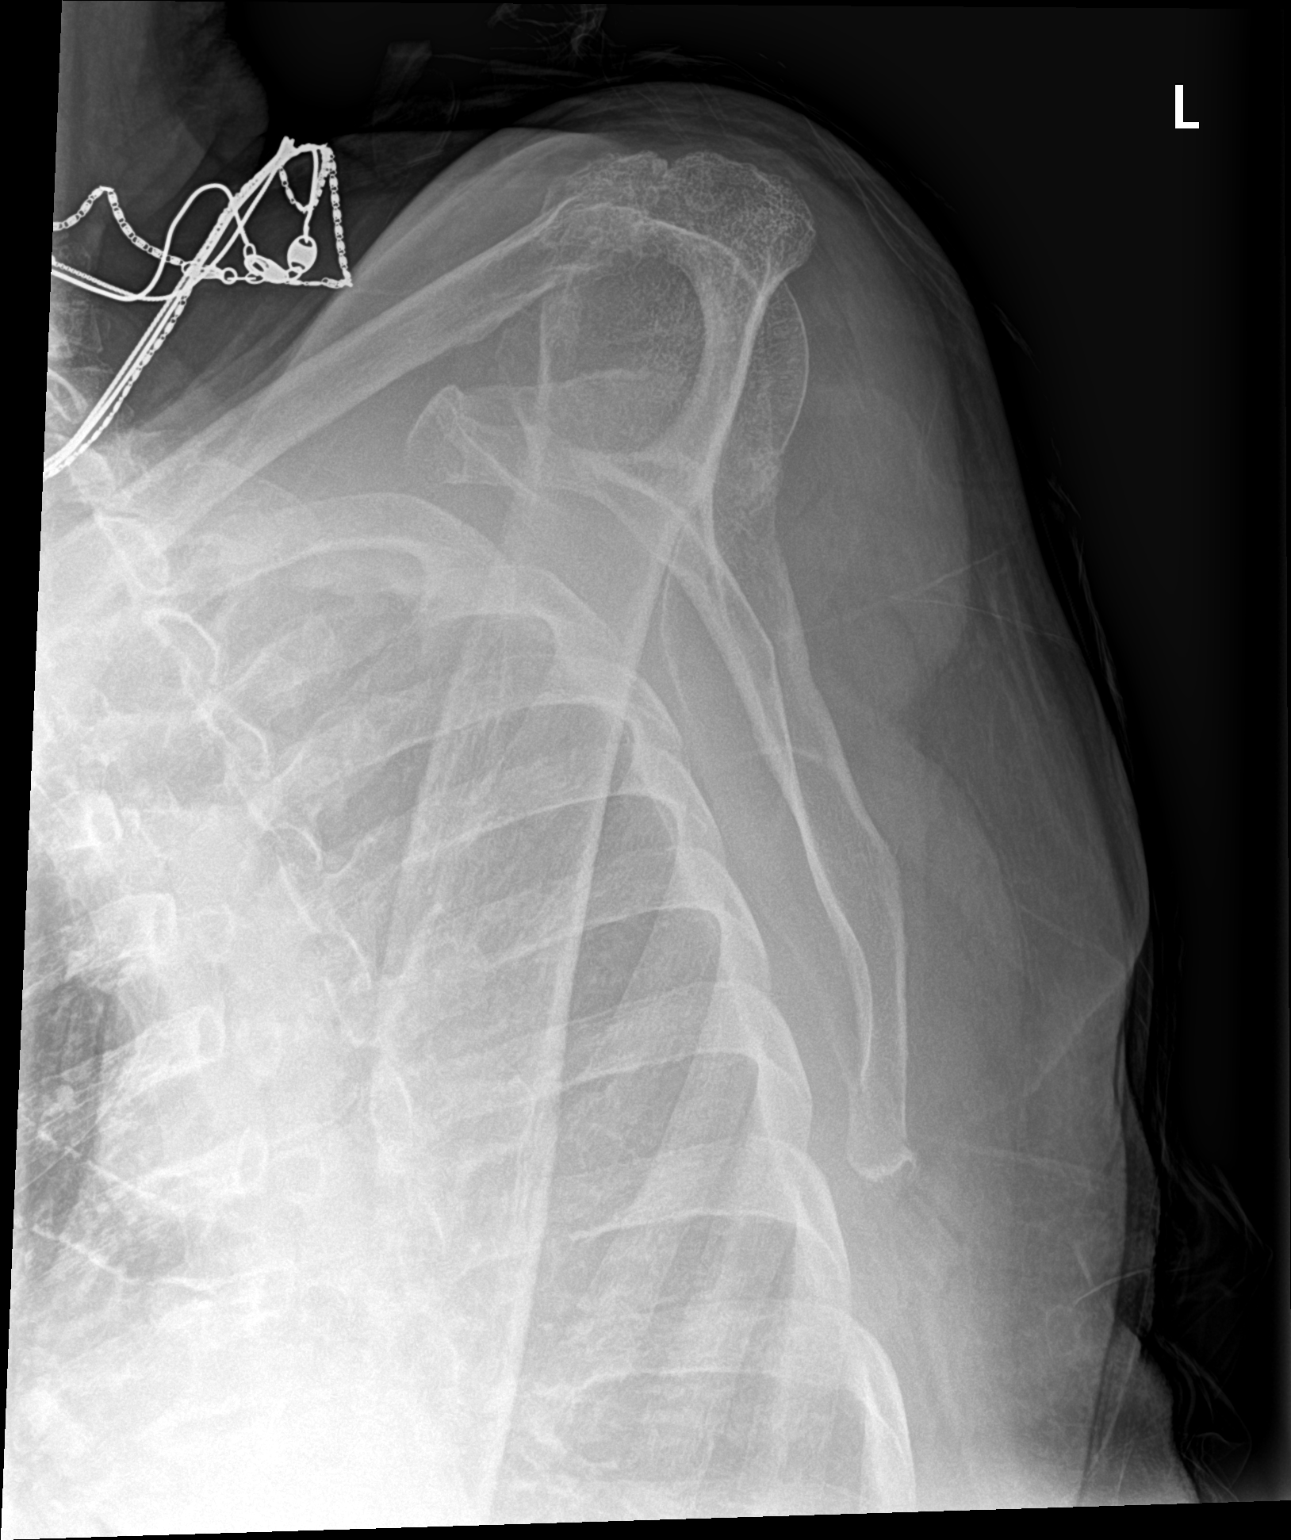

[3 of 3 positions shown; findings below may reference images not displayed]

FINDINGS: Normal alignment. No fracture or dislocation. Degenerative changes
are noted within the glenohumeral and acromioclavicular joint with
asymmetric joint space narrowing. Limited evaluation of the left
apex is unremarkable.
IMPRESSION: No acute fracture or dislocation

## 2022-04-11 IMAGING — DX DG CHEST 1V
1 series · 1 of 1 positions shown · non-contrast
Comparison: Chest radiograph 06/21/2018

CLINICAL DATA: Syncope

EXAM:
CHEST  1 VIEW

[chest]
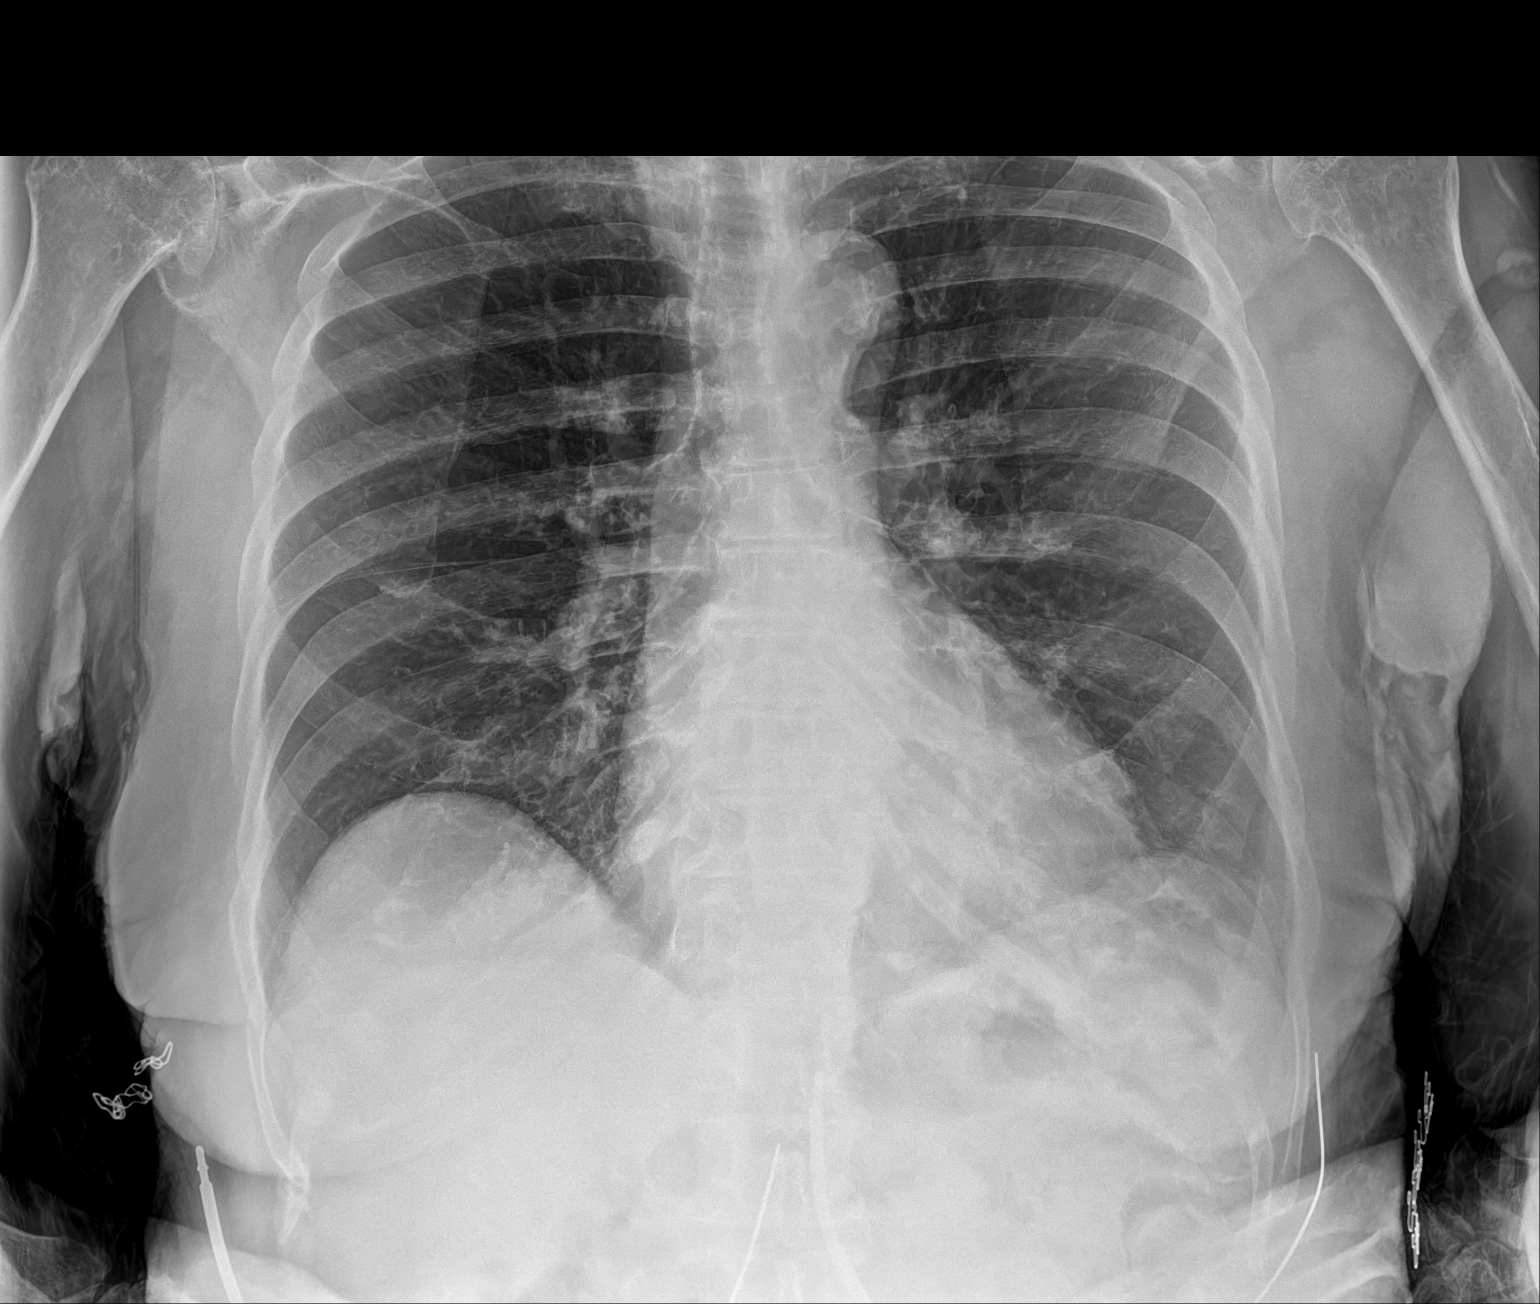

[1 of 1 positions shown; findings below may reference images not displayed]

FINDINGS: Normal cardiac and mediastinal contours. No consolidative pulmonary
opacities. No pleural effusion or pneumothorax. Osseous structures
unremarkable.
IMPRESSION: No acute cardiopulmonary process.

## 2022-04-11 IMAGING — DX DG HAND COMPLETE 3+V*R*
1 series · 3 of 3 positions shown · non-contrast
Comparison: None.

CLINICAL DATA: Fall, head injury

EXAM:
RIGHT HAND - COMPLETE 3+ VIEW

[Series 1: hand · 0.14mm/px · 3 of 3 slices shown]
[im 1/3]
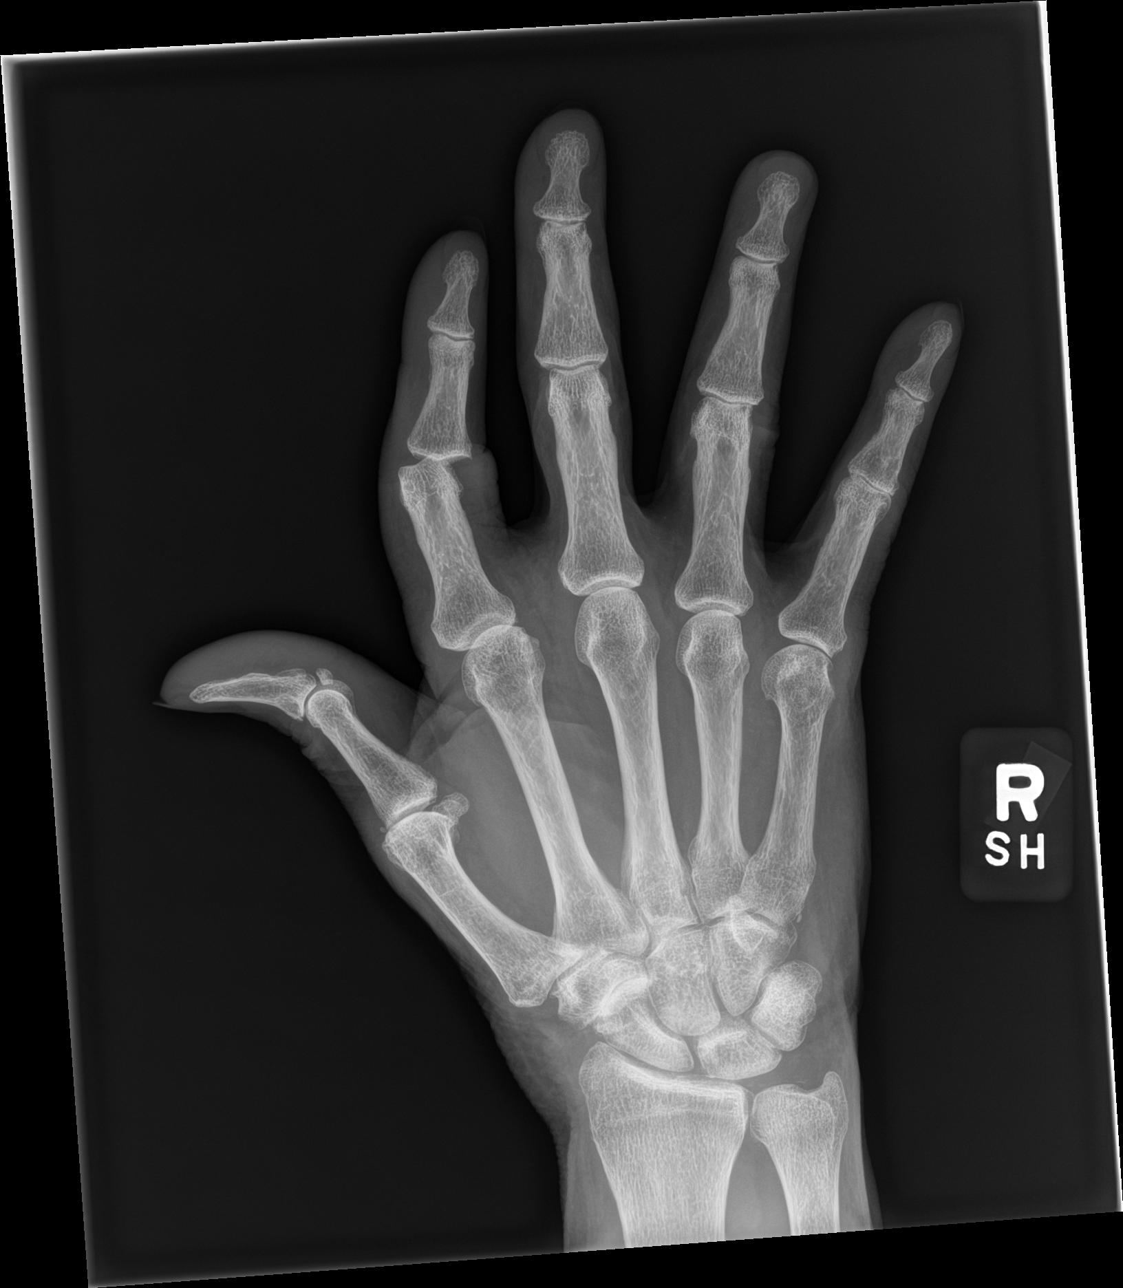
[im 2/3]
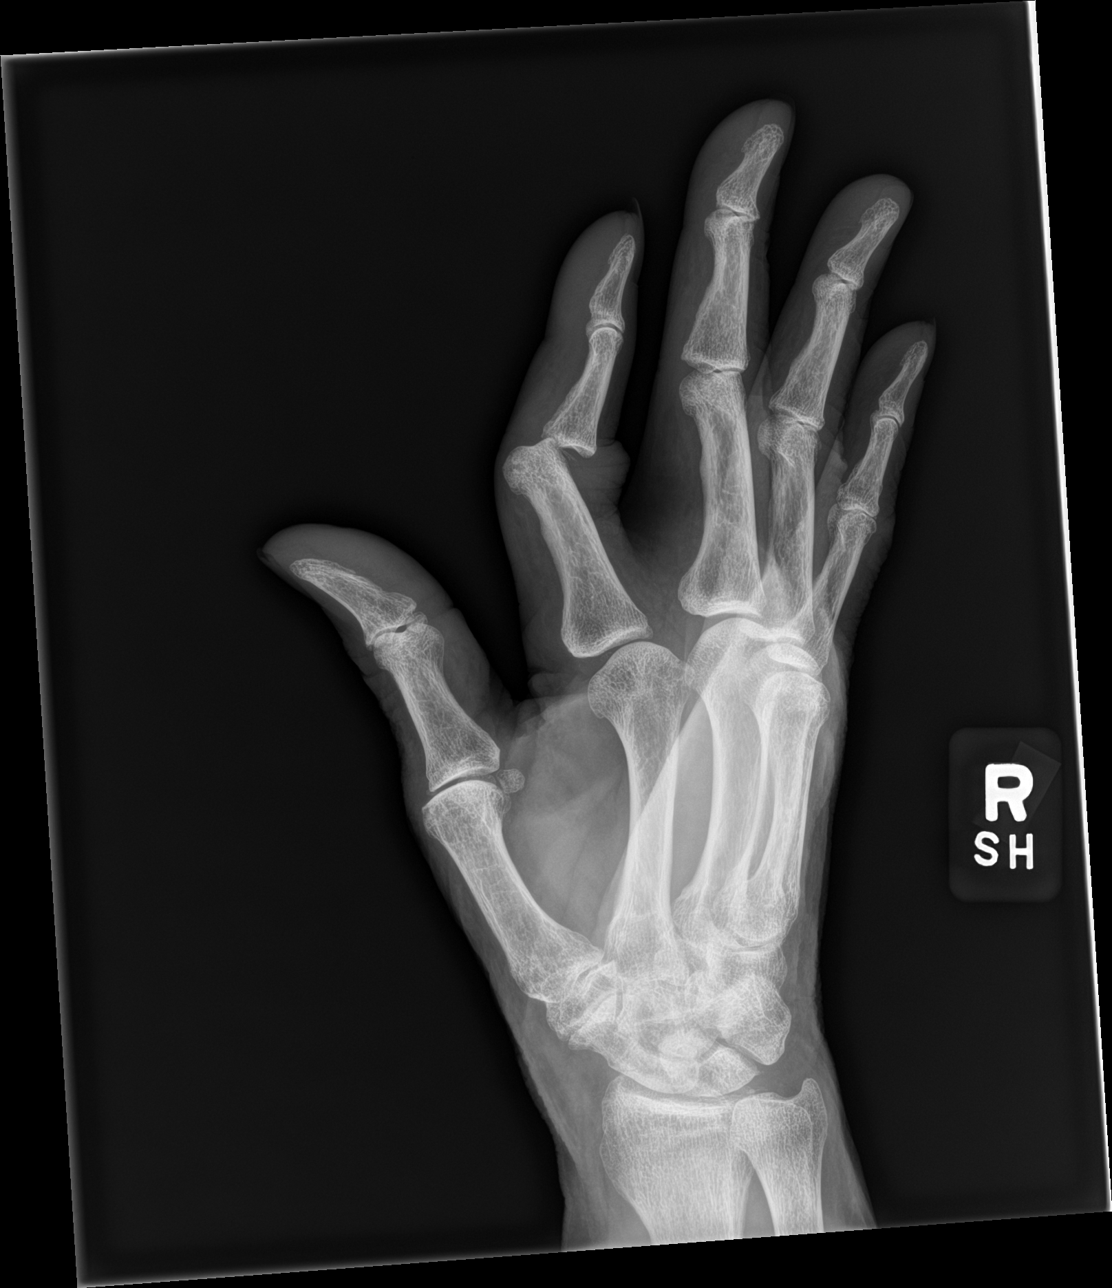
[im 3/3]
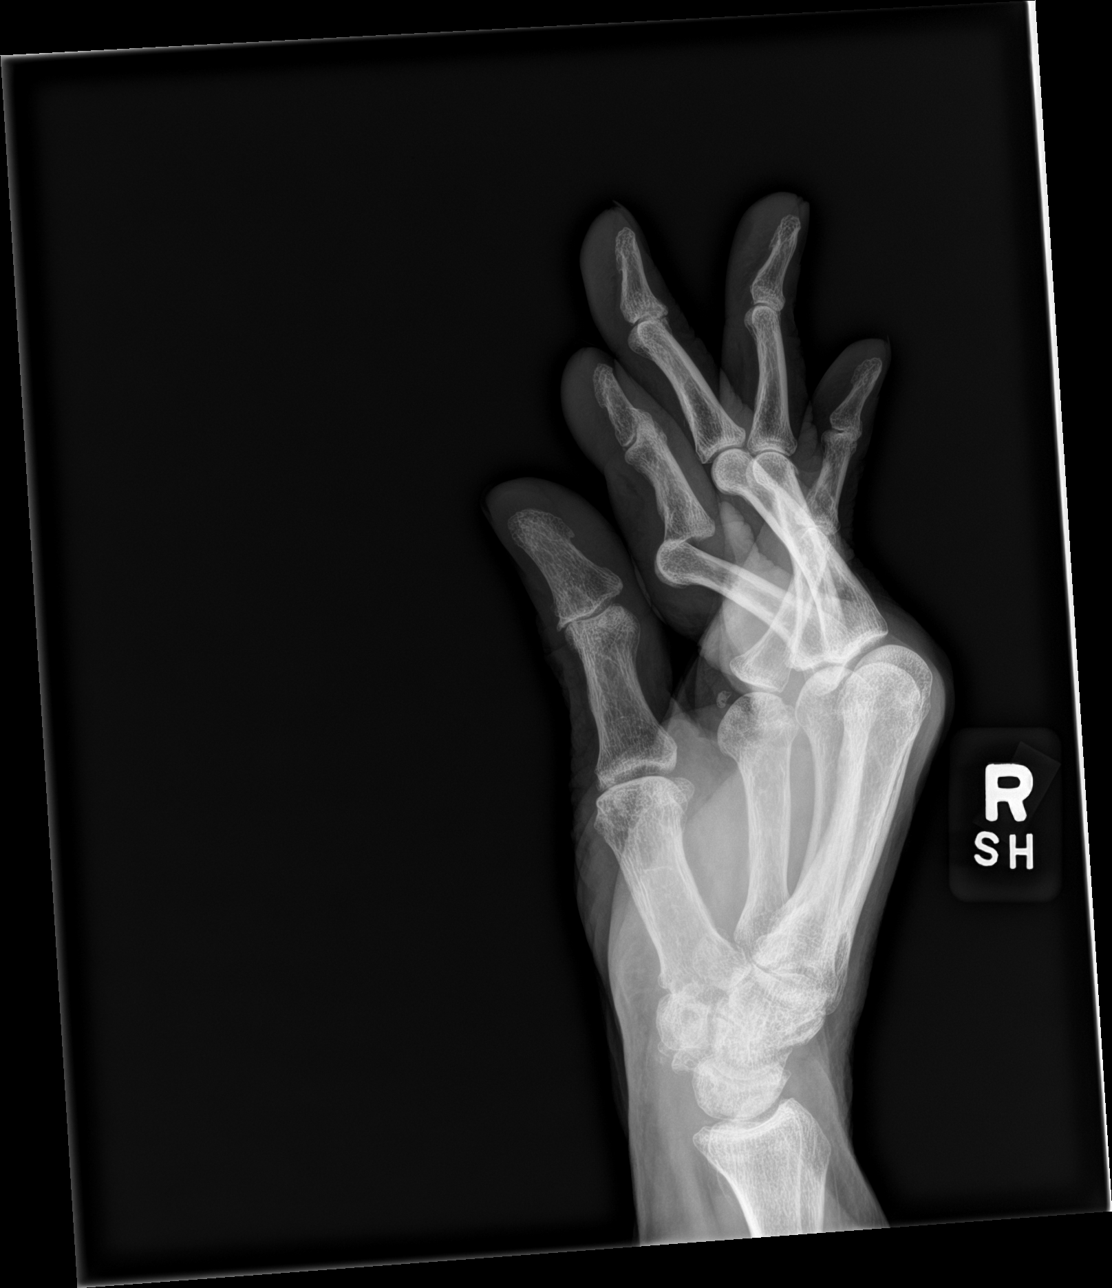

[3 of 3 positions shown; findings below may reference images not displayed]

FINDINGS: Dorsal dislocation noted at the right 2nd PIP joint. No visible
fracture. Soft tissues are intact.
IMPRESSION: Dorsal dislocation at the right 2nd PIP joint.

## 2022-04-13 ENCOUNTER — Ambulatory Visit (INDEPENDENT_AMBULATORY_CARE_PROVIDER_SITE_OTHER): Payer: Medicare Other | Admitting: Podiatry

## 2022-04-13 ENCOUNTER — Encounter: Payer: Self-pay | Admitting: Podiatry

## 2022-04-13 ENCOUNTER — Other Ambulatory Visit: Payer: Self-pay | Admitting: Neurology

## 2022-04-13 DIAGNOSIS — M79674 Pain in right toe(s): Secondary | ICD-10-CM

## 2022-04-13 DIAGNOSIS — M79675 Pain in left toe(s): Secondary | ICD-10-CM | POA: Diagnosis not present

## 2022-04-13 DIAGNOSIS — B351 Tinea unguium: Secondary | ICD-10-CM | POA: Diagnosis not present

## 2022-04-13 NOTE — Progress Notes (Signed)
This patient presents to the office with chief complaint of long thick painful nails.  Patient says the nails are painful walking and wearing shoes.  This patient is unable to self treat.  This patient is unable to trim her nails since she is unable to reach her nails. She presents to the office with her caregiver.  She presents to the office for preventative foot care services.  General Appearance  Alert, conversant and in no acute stress.  Vascular  Dorsalis pedis and posterior tibial  pulses are weakly  palpable  bilaterally.  Capillary return is within normal limits  bilaterally. Temperature is within normal limits  bilaterally.  Neurologic  Senn-Weinstein monofilament wire test within normal limits  bilaterally. Muscle power within normal limits bilaterally.  Nails Thick disfigured discolored nails with subungual debris  from hallux to fifth toes bilaterally. No evidence of bacterial infection or drainage bilaterally.  Orthopedic  No limitations of motion  feet .  No crepitus or effusions noted.  No bony pathology or digital deformities noted.   Skin  normotropic skin with no porokeratosis noted bilaterally.  No signs of infections or ulcers noted.     Onychomycosis  Nails  B/L.  Pain in right toes  Pain in left toes  Debridement of nails both feet followed trimming the nails with dremel tool.    RTC 3 months.  Nicholes Rough D.P.M.

## 2022-05-03 ENCOUNTER — Telehealth: Payer: Self-pay | Admitting: Family Medicine

## 2022-05-03 MED ORDER — SIMVASTATIN 40 MG PO TABS: 40.0000 mg | ORAL_TABLET | Freq: Every day | ORAL | 3 refills | Status: DC

## 2022-05-03 NOTE — Telephone Encounter (Signed)
Rx sent 

## 2022-05-03 NOTE — Telephone Encounter (Signed)
Pt caregiver is calling and pt will be out of medication tomorrow and need a refill on simvastatin (ZOCOR) 40 MG tablet  [facility] [address], [place] - [address] Phone: [phone]  Fax: [phone]

## 2022-05-13 ENCOUNTER — Other Ambulatory Visit: Payer: Self-pay | Admitting: Neurology

## 2022-05-24 ENCOUNTER — Telehealth: Payer: Self-pay | Admitting: Family Medicine

## 2022-05-24 NOTE — Telephone Encounter (Signed)
Inform pt her neurology sent in the Rx on 05/16/2022 to [facility] on [address]. Verbalized understanding.

## 2022-05-24 NOTE — Telephone Encounter (Signed)
Pt called to request a refill of the following:  memantine (NAMENDA) 10 MG tablet   LOV:  03/03/22  Please advise.  [place], [place] - [address] Phone: [phone]  Fax: [phone]

## 2022-05-31 ENCOUNTER — Telehealth: Payer: Self-pay | Admitting: Family Medicine

## 2022-05-31 NOTE — Telephone Encounter (Signed)
Prescription Request  05/31/2022  Is this a "Controlled Substance" medicine? No  LOV: 03/03/2022  What is the name of the medication or equipment?  losartan (COZAAR) 100 MG tablet  Have you contacted your pharmacy to request a refill? Yes   Which pharmacy would you like this sent to?  [place], [place] - [address] [place] [address] Phone: [phone] Fax: [phone]    Patient notified that their request is being sent to the clinical staff for review and that they should receive a response within 2 business days.   Please advise at Mobile [phone] (mobile)

## 2022-06-01 ENCOUNTER — Other Ambulatory Visit: Payer: Self-pay

## 2022-06-01 MED ORDER — LOSARTAN POTASSIUM 100 MG PO TABS: 100.0000 mg | ORAL_TABLET | Freq: Every day | ORAL | 1 refills | Status: DC

## 2022-06-01 NOTE — Telephone Encounter (Signed)
Pt's daughter called to say Pt is completely out of the Losartan. Daughter says pharmacy is telling her they are waiting on MD.

## 2022-06-01 NOTE — Telephone Encounter (Signed)
Rx sent. Patient is aware.  

## 2022-06-01 NOTE — Telephone Encounter (Signed)
Pt is out of medication losartan (COZAAR) 100 MG tablet and would like a refill

## 2022-06-10 DIAGNOSIS — H25813 Combined forms of age-related cataract, bilateral: Secondary | ICD-10-CM | POA: Diagnosis not present

## 2022-06-22 ENCOUNTER — Other Ambulatory Visit: Payer: Self-pay | Admitting: Family Medicine

## 2022-06-22 ENCOUNTER — Other Ambulatory Visit: Payer: Self-pay | Admitting: Neurology

## 2022-07-15 ENCOUNTER — Ambulatory Visit: Payer: Medicare Other | Admitting: Podiatry

## 2022-07-22 ENCOUNTER — Other Ambulatory Visit: Payer: Self-pay | Admitting: Neurology

## 2022-08-25 ENCOUNTER — Other Ambulatory Visit: Payer: Self-pay | Admitting: Neurology

## 2022-09-08 ENCOUNTER — Ambulatory Visit: Payer: Medicare Other | Admitting: Podiatry

## 2022-09-09 ENCOUNTER — Ambulatory Visit (INDEPENDENT_AMBULATORY_CARE_PROVIDER_SITE_OTHER): Payer: Medicare Other | Admitting: Podiatry

## 2022-09-09 ENCOUNTER — Encounter: Payer: Self-pay | Admitting: Podiatry

## 2022-09-09 DIAGNOSIS — M79675 Pain in left toe(s): Secondary | ICD-10-CM | POA: Diagnosis not present

## 2022-09-09 DIAGNOSIS — B351 Tinea unguium: Secondary | ICD-10-CM

## 2022-09-09 DIAGNOSIS — M79674 Pain in right toe(s): Secondary | ICD-10-CM

## 2022-09-09 NOTE — Progress Notes (Addendum)
This patient presents to the office with chief complaint of long thick painful nails.  Patient says the nails are painful walking and wearing shoes.  This patient is unable to self treat.  This patient is unable to trim her nails since she is unable to reach her nails. She presents to the office with her daughter.  She presents to the office for preventative foot care services.  General Appearance  Alert, conversant and in no acute stress.  Vascular  Dorsalis pedis and posterior tibial  pulses are weakly  palpable  bilaterally.  Capillary return is within normal limits  bilaterally. Temperature is within normal limits  bilaterally.  Neurologic  Senn-Weinstein monofilament wire test within normal limits  bilaterally. Muscle power within normal limits bilaterally.  Nails Thick disfigured discolored nails with subungual debris  from hallux to fifth toes bilaterally. No evidence of bacterial infection or drainage bilaterally.  Orthopedic  No limitations of motion  feet .  No crepitus or effusions noted.  No bony pathology or digital deformities noted. HAV  B/L.  Skin  normotropic skin with no porokeratosis noted bilaterally.  No signs of infections or ulcers noted.     Onychomycosis  Nails  B/L.  Pain in right toes  Pain in left toes  Debridement of nails both feet followed trimming the nails with dremel tool.  After finishing my treatment I left the room.  The daughter proceeded to examine my work and caused bleeding to occur.  Morrie Sheldon entered the room and the bleeding stopped and neosporin/DSD was applied to area of bleeding.  RTC 3 months.   Helane Gunther DPM

## 2022-09-23 ENCOUNTER — Other Ambulatory Visit: Payer: Self-pay | Admitting: Neurology

## 2022-09-23 ENCOUNTER — Other Ambulatory Visit: Payer: Self-pay | Admitting: Family Medicine

## 2022-10-06 ENCOUNTER — Ambulatory Visit: Payer: Medicare Other | Admitting: Neurology

## 2022-10-30 ENCOUNTER — Other Ambulatory Visit: Payer: Self-pay | Admitting: Neurology

## 2022-11-01 ENCOUNTER — Other Ambulatory Visit: Payer: Self-pay | Admitting: Family Medicine

## 2022-11-01 DIAGNOSIS — I10 Essential (primary) hypertension: Secondary | ICD-10-CM

## 2022-11-09 ENCOUNTER — Other Ambulatory Visit: Payer: Self-pay

## 2022-11-09 MED ORDER — SIMVASTATIN 40 MG PO TABS: 40.0000 mg | ORAL_TABLET | Freq: Every day | ORAL | 3 refills | Status: DC

## 2022-11-23 ENCOUNTER — Encounter (HOSPITAL_BASED_OUTPATIENT_CLINIC_OR_DEPARTMENT_OTHER): Payer: Self-pay

## 2022-11-23 ENCOUNTER — Emergency Department (HOSPITAL_BASED_OUTPATIENT_CLINIC_OR_DEPARTMENT_OTHER)
Admission: EM | Admit: 2022-11-23 | Discharge: 2022-11-23 | Disposition: A | Discharge status: Home / Self Care | Payer: Medicare Other | Attending: Emergency Medicine | Admitting: Emergency Medicine

## 2022-11-23 ENCOUNTER — Emergency Department (HOSPITAL_BASED_OUTPATIENT_CLINIC_OR_DEPARTMENT_OTHER): Payer: Medicare Other

## 2022-11-23 ENCOUNTER — Other Ambulatory Visit: Payer: Self-pay

## 2022-11-23 DIAGNOSIS — Z743 Need for continuous supervision: Secondary | ICD-10-CM | POA: Diagnosis not present

## 2022-11-23 DIAGNOSIS — S0990XA Unspecified injury of head, initial encounter: Secondary | ICD-10-CM

## 2022-11-23 DIAGNOSIS — R609 Edema, unspecified: Secondary | ICD-10-CM | POA: Diagnosis not present

## 2022-11-23 DIAGNOSIS — Y92009 Unspecified place in unspecified non-institutional (private) residence as the place of occurrence of the external cause: Secondary | ICD-10-CM | POA: Insufficient documentation

## 2022-11-23 DIAGNOSIS — I10 Essential (primary) hypertension: Secondary | ICD-10-CM | POA: Diagnosis not present

## 2022-11-23 DIAGNOSIS — G4489 Other headache syndrome: Secondary | ICD-10-CM | POA: Diagnosis not present

## 2022-11-23 DIAGNOSIS — S199XXA Unspecified injury of neck, initial encounter: Secondary | ICD-10-CM | POA: Diagnosis not present

## 2022-11-23 DIAGNOSIS — Y92002 Bathroom of unspecified non-institutional (private) residence single-family (private) house as the place of occurrence of the external cause: Secondary | ICD-10-CM | POA: Diagnosis not present

## 2022-11-23 DIAGNOSIS — W010XXA Fall on same level from slipping, tripping and stumbling without subsequent striking against object, initial encounter: Secondary | ICD-10-CM | POA: Diagnosis not present

## 2022-11-23 DIAGNOSIS — W19XXXA Unspecified fall, initial encounter: Secondary | ICD-10-CM

## 2022-11-23 DIAGNOSIS — R6889 Other general symptoms and signs: Secondary | ICD-10-CM | POA: Diagnosis not present

## 2022-11-23 DIAGNOSIS — S0993XA Unspecified injury of face, initial encounter: Secondary | ICD-10-CM | POA: Diagnosis not present

## 2022-11-23 DIAGNOSIS — S0101XA Laceration without foreign body of scalp, initial encounter: Secondary | ICD-10-CM | POA: Diagnosis not present

## 2022-11-23 DIAGNOSIS — S0181XA Laceration without foreign body of other part of head, initial encounter: Secondary | ICD-10-CM | POA: Insufficient documentation

## 2022-11-23 DIAGNOSIS — I2510 Atherosclerotic heart disease of native coronary artery without angina pectoris: Secondary | ICD-10-CM | POA: Insufficient documentation

## 2022-11-23 LAB — BASIC METABOLIC PANEL
Anion gap: 7 (ref 5–15)
BUN: 18 mg/dL (ref 8–23)
CO2: 26 mmol/L (ref 22–32)
Calcium: 9.4 mg/dL (ref 8.9–10.3)
Chloride: 93 mmol/L — ABNORMAL LOW (ref 98–111)
Creatinine, Ser: 0.91 mg/dL (ref 0.44–1.00)
GFR, Estimated: >60 mL/min (ref >60)
Glucose, Bld: 85 mg/dL (ref 70–99)
Potassium: 4.7 mmol/L (ref 3.5–5.1)
Sodium: 126 mmol/L — ABNORMAL LOW (ref 135–145)

## 2022-11-23 LAB — CBC WITH DIFFERENTIAL/PLATELET
Abs Immature Granulocytes: 0.02 10*3/uL (ref 0.00–0.07)
Basophils Absolute: 0 10*3/uL (ref 0.0–0.1)
Basophils Relative: 1 %
Eosinophils Absolute: 0.1 10*3/uL (ref 0.0–0.5)
Eosinophils Relative: 3 %
HCT: 38.3 % (ref 36.0–46.0)
Hemoglobin: 12.7 g/dL (ref 12.0–15.0)
Immature Granulocytes: 0 %
Lymphocytes Relative: 33 %
Lymphs Abs: 1.8 10*3/uL (ref 0.7–4.0)
MCH: 32 pg (ref 26.0–34.0)
MCHC: 33.2 g/dL (ref 30.0–36.0)
MCV: 96.5 fL (ref 80.0–100.0)
Monocytes Absolute: 0.5 10*3/uL (ref 0.1–1.0)
Monocytes Relative: 10 %
Neutro Abs: 2.8 10*3/uL (ref 1.7–7.7)
Neutrophils Relative %: 53 %
Platelets: 196 10*3/uL (ref 150–400)
RBC: 3.97 MIL/uL (ref 3.87–5.11)
RDW: 13.1 % (ref 11.5–15.5)
WBC: 5.3 10*3/uL (ref 4.0–10.5)
nRBC: 0 % (ref 0.0–0.2)

## 2022-11-23 MED ORDER — LIDOCAINE-EPINEPHRINE (PF) 2 %-1:200000 IJ SOLN
10.0000 mL | Freq: Once | INTRAMUSCULAR | Status: DC
  Filled 2022-11-23: qty 20

## 2022-11-23 NOTE — ED Provider Notes (Signed)
[facility] EMERGENCY DEPARTMENT AT [facility] Provider Note   CSN: [id number] Arrival date & time: 11/23/22  1637     History  No chief complaint on file.   Felicia Cross is a 89 y.o. female.  The history is provided by the patient, a relative and medical records. No language interpreter was used.     89 year old female with significant history of hypertension, CAD, dyslipidemia, anemia, vertigo, seizure, brought here via EMS for evaluation of a fall.  Patient recently returned from [place] after visiting her older daughter.  She came back home today.  She was in the bathroom in the process of changing when she reached down to pick up her shoes and lost balance striking her forehead against the ground.  She denies any loss of consciousness.  When she was able to call out for help.  Family member arrived quickly and contacted EMS to bring her here.  Patient did suffer a laceration to her forehead.  She reports minimal pain there.  Denies any second headache, neck pain, chest pain, trouble breathing, abdominal pain, back pain, arm pain, any new focal numbness or focal weakness or confusion.  Denies any nausea or vomiting.  No precipitating symptoms prior to the fall.  She is up-to-date with tetanus.  She normally uses a wheelchair to move about.  And she is not on any blood thinner medication.  Home Medications Prior to Admission medications   Medication Sig Start Date End Date Taking? Authorizing Provider  acetaminophen (TYLENOL) 325 MG tablet Take 975 mg by mouth See admin instructions. Take 3 tablets (975 mg) by mouth three times daily, may also take 2 tablets (650 mg) by mouth every 6 hours as needed for moderate pain/headache    [provider]  aspirin EC 81 MG tablet Take 1 tablet (81 mg total) by mouth daily. Patient taking differently: Take 81 mg by mouth every other day. 08/18/16   Terressa Koyanagi, DO  Blood Pressure Monitoring (BLOOD PRESSURE CUFF) MISC Use as  directed for checking bp daily. 09/29/21   Deeann Saint, MD  levETIRAcetam (KEPPRA) 500 MG tablet Keppra 500 mg twice daily x6 weeks, then to 250mg  BID x 1 week 07/27/21   Maretta Bees, MD  losartan (COZAAR) 100 MG tablet Take 1 tablet (100 mg total) by mouth daily. 06/01/22   Deeann Saint, MD  memantine (NAMENDA) 10 MG tablet TAKE 1 TABLET BY MOUTH TWICE DAILY .  START  AFTER  COMPLETING  STARTER  PACK. 10/31/22   Micki Riley, MD  metoprolol tartrate (LOPRESSOR) 25 MG tablet Take 1 tablet by mouth twice daily 09/23/22   Deeann Saint, MD  Multiple Vitamin-Folic Acid TABS Take 1 tablet by mouth every morning.    [provider]  simvastatin (ZOCOR) 40 MG tablet Take 1 tablet (40 mg total) by mouth daily. 11/09/22   Deeann Saint, MD  spironolactone (ALDACTONE) 25 MG tablet Take 1 tablet by mouth once daily 11/02/22   Deeann Saint, MD  vitamin B-12 (CYANOCOBALAMIN) 1000 MCG tablet Take 1 tablet (1,000 mcg total) by mouth daily. 07/27/21   Ghimire, Werner Lean, MD      Allergies    Codeine and Hydrocodone    Review of Systems   Review of Systems  All other systems reviewed and are negative.   Physical Exam Updated Vital Signs BP (!) 161/60 (BP Location: Right Arm)   Pulse 66   Temp 98.9 F (  37.2 C) (Oral)   Resp 18   Ht 5\' 4"  (1.626 m)   Wt 77.3 kg   SpO2 100%   BMI 29.25 kg/m  Physical Exam Vitals and nursing note reviewed.  Constitutional:      General: She is not in acute distress.    Appearance: She is well-developed.  HENT:     Head: Normocephalic.     Comments: Forehead: Superficial 3 cm laceration noted to mid forehead with dressing in place.  Mild tenderness to palpation no crepitus no active bleeding. Eyes:     Extraocular Movements: Extraocular movements intact.     Conjunctiva/sclera: Conjunctivae normal.     Pupils: Pupils are equal, round, and reactive to light.  Cardiovascular:     Rate and Rhythm: Normal rate and regular rhythm.      Heart sounds: Murmur heard.  Pulmonary:     Effort: Pulmonary effort is normal.     Breath sounds: Normal breath sounds.  Abdominal:     Palpations: Abdomen is soft.     Tenderness: There is no abdominal tenderness.  Musculoskeletal:     Cervical back: Normal range of motion and neck supple. No tenderness.     Comments: Unable to move left arm, this is a baseline  Skin:    Findings: No rash.  Neurological:     Mental Status: She is alert.     Comments: Alert and oriented x 3.  Thought it was October instead of July.  Psychiatric:        Mood and Affect: Mood normal.     ED Results / Procedures / Treatments   Labs (all labs ordered are listed, but only abnormal results are displayed) Labs Reviewed  BASIC METABOLIC PANEL - Abnormal; Notable for the following components:      Result Value   Sodium 126 (*)    Chloride 93 (*)    All other components within normal limits  CBC WITH DIFFERENTIAL/PLATELET    EKG None  Date: 11/23/2022  Rate: 76  Rhythm: normal sinus rhythm  QRS Axis: normal  Intervals: normal  ST/T Wave abnormalities: normal  Conduction Disutrbances: none  Narrative Interpretation:   Old EKG Reviewed: No significant changes noted    Radiology CT Head Wo Contrast  Result Date: 11/23/2022 CLINICAL DATA:  Facial trauma, blunt. EXAM: CT HEAD WITHOUT CONTRAST CT CERVICAL SPINE WITHOUT CONTRAST TECHNIQUE: Multidetector CT imaging of the head and cervical spine was performed following the standard protocol without intravenous contrast. Multiplanar CT image reconstructions of the cervical spine were also generated. RADIATION DOSE REDUCTION: This exam was performed according to the departmental dose-optimization program which includes automated exposure control, adjustment of the mA and/or kV according to patient size and/or use of iterative reconstruction technique. COMPARISON:  Head CT 10/08/2021. MRI brain 10/08/2021. CT cervical spine 07/23/2021. FINDINGS: CT HEAD  FINDINGS Brain: No acute hemorrhage. Unchanged chronic small-vessel disease. Cortical gray-white differentiation is otherwise preserved. Prominence of the ventricles and sulci within expected range for age. No hydrocephalus or extra-axial collection. No mass effect or midline shift. Vascular: No hyperdense vessel or unexpected calcification. Skull: No calvarial fracture or suspicious bone lesion. Skull base is unremarkable. Sinuses/Orbits: Unremarkable. Other: Left frontal scalp laceration with underlying hematoma. CT CERVICAL SPINE FINDINGS Alignment: 3 mm degenerative anterolisthesis of C7 on T1. Skull base and vertebrae: No acute fracture. Normal craniocervical junction. No suspicious bone lesions. Soft tissues and spinal canal: No prevertebral fluid or swelling. No visible canal hematoma. Disc levels: Multilevel cervical spondylosis,  worst at C6-7, where there is at least mild spinal canal stenosis. Upper chest: Unremarkable. Other: None. IMPRESSION: 1. No acute intracranial abnormality. Left frontal scalp laceration with underlying hematoma. 2. No acute cervical spine fracture or traumatic listhesis. Electronically Signed   By: Orvan Falconer M.D.   On: 11/23/2022 17:51   CT Cervical Spine Wo Contrast  Result Date: 11/23/2022 CLINICAL DATA:  Facial trauma, blunt. EXAM: CT HEAD WITHOUT CONTRAST CT CERVICAL SPINE WITHOUT CONTRAST TECHNIQUE: Multidetector CT imaging of the head and cervical spine was performed following the standard protocol without intravenous contrast. Multiplanar CT image reconstructions of the cervical spine were also generated. RADIATION DOSE REDUCTION: This exam was performed according to the departmental dose-optimization program which includes automated exposure control, adjustment of the mA and/or kV according to patient size and/or use of iterative reconstruction technique. COMPARISON:  Head CT 10/08/2021. MRI brain 10/08/2021. CT cervical spine 07/23/2021. FINDINGS: CT HEAD  FINDINGS Brain: No acute hemorrhage. Unchanged chronic small-vessel disease. Cortical gray-white differentiation is otherwise preserved. Prominence of the ventricles and sulci within expected range for age. No hydrocephalus or extra-axial collection. No mass effect or midline shift. Vascular: No hyperdense vessel or unexpected calcification. Skull: No calvarial fracture or suspicious bone lesion. Skull base is unremarkable. Sinuses/Orbits: Unremarkable. Other: Left frontal scalp laceration with underlying hematoma. CT CERVICAL SPINE FINDINGS Alignment: 3 mm degenerative anterolisthesis of C7 on T1. Skull base and vertebrae: No acute fracture. Normal craniocervical junction. No suspicious bone lesions. Soft tissues and spinal canal: No prevertebral fluid or swelling. No visible canal hematoma. Disc levels: Multilevel cervical spondylosis, worst at C6-7, where there is at least mild spinal canal stenosis. Upper chest: Unremarkable. Other: None. IMPRESSION: 1. No acute intracranial abnormality. Left frontal scalp laceration with underlying hematoma. 2. No acute cervical spine fracture or traumatic listhesis. Electronically Signed   By: Orvan Falconer M.D.   On: 11/23/2022 17:51    Procedures .Marland KitchenLaceration Repair  Date/Time: 11/23/2022 6:36 PM  Performed by: Fayrene Helper, PA-C Authorized by: Fayrene Helper, PA-C   Consent:    Consent obtained:  Verbal   Consent given by:  Patient   Risks, benefits, and alternatives were discussed: yes     Risks discussed:  Infection and poor wound healing   Alternatives discussed:  No treatment Universal protocol:    Procedure explained and questions answered to patient or proxy's satisfaction: yes     Relevant documents present and verified: yes     Patient identity confirmed:  Verbally with patient and arm band Laceration details:    Location:  Scalp   Scalp location:  Frontal   Length (cm):  3   Depth (mm):  3 Pre-procedure details:    Preparation:  Patient was  prepped and draped in usual sterile fashion Exploration:    Limited defect created (wound extended): no     Hemostasis achieved with:  Epinephrine   Imaging outcome: foreign body not noted     Wound exploration: wound explored through full range of motion and entire depth of wound visualized     Wound extent: no underlying fracture     Contaminated: no   Treatment:    Area cleansed with:  Saline   Amount of cleaning:  Standard   Irrigation solution:  Sterile saline   Irrigation method:  Pressure wash   Debridement:  None   Undermining:  None   Scar revision: no   Skin repair:    Repair method:  Sutures   Suture size:  5-0  Suture material:  Chromic gut   Suture technique:  Simple interrupted   Number of sutures:  5 Approximation:    Approximation:  Close Repair type:    Repair type:  Intermediate Post-procedure details:    Dressing:  Non-adherent dressing   Procedure completion:  Tolerated well, no immediate complications     Medications Ordered in ED Medications  lidocaine-EPINEPHrine (XYLOCAINE W/EPI) 2 %-1:200000 (PF) injection 10 mL (has no administration in time range)    ED Course/ Medical Decision Making/ A&P                             Medical Decision Making  BP (!) 161/60 (BP Location: Right Arm)   Pulse 65   Temp 98 F (36.7 C) (Oral)   Resp 17   Ht 5\' 4"  (1.626 m)   Wt 68 kg   SpO2 99%   BMI 25.75 kg/m   71:11 PM   89 year old female with significant history of hypertension, CAD, dyslipidemia, anemia, vertigo, seizure, brought here via EMS for evaluation of a fall.  Patient recently returned from [place] after visiting her older daughter.  She came back home today.  She was in the bathroom in the process of changing when she reached down to pick up her shoes and lost balance striking her forehead against the ground.  She denies any loss of consciousness.  When she was able to call out for help.  Family member arrived quickly and contacted EMS to  bring her here.  Patient did suffer a laceration to her forehead.  She reports minimal pain there.  Denies any second headache, neck pain, chest pain, trouble breathing, abdominal pain, back pain, arm pain, any new focal numbness or focal weakness or confusion.  Denies any nausea or vomiting.  No precipitating symptoms prior to the fall.  She is up-to-date with tetanus.  She normally uses a wheelchair to move about.  And she is not on any blood thinner medication.  On exam, elderly female laying in bed appears to be in no acute discomfort.  She does have a 3 cm laceration to her mid forehead with mild tenderness to palpation.  No other significant injury noted.  She is unable to move her left arm but this is chronic.  She has some peripheral edema noted to bilateral lower extremities bilateral low suspicion for DVTs.  She did mistakenly thought it was October instead of July however The year and the current president correct.  Vital signs notable for elevated blood pressure of 161/60.  Workup initiated.  Will perform laceration repair.  -Labs ordered, independently viewed and interpreted by me.  Labs remarkable for Na+ 126, low but not far off from her baseline.  -The patient was maintained on a cardiac monitor.  I personally viewed and interpreted the cardiac monitored which showed an underlying rhythm of: NSR -Imaging independently viewed and interpreted by me and I agree with radiologist's interpretation.  Result remarkable for CT of head/cspine with left frontal scalp laceration -This patient presents to the ED for concern of fall, this involves an extensive number of treatment options, and is a complaint that carries with it a high risk of complications and morbidity.  The differential diagnosis includes mechanical fall, anemia, electrolytes imbalance, hypoglycemia, stroke, infection, seizure -Co morbidities that complicate the patient evaluation includes seizure, vertigo, HTN, frequen  falls -Treatment includes laceration repair -Reevaluation of the patient after these medicines showed that  the patient improved -PCP office notes or outside notes reviewed -Discussion with specialist attending Dr. Rubin Payor -Escalation to admission/observation considered: patients feels much better, is comfortable with discharge, and will follow up with PCP -Prescription medication considered, patient comfortable with home OTC -Social Determinant of Health considered which includes inactivity  6:42 PM Workup overall reassuring.  Mild hyponatremia but not too far off from her baseline.  Laceration of the forehead was sutured by me using absorbable sutures.  Appropriate wound care instruction provided to family member.  Patient otherwise stable to be discharged home with return precaution.         Final Clinical Impression(s) / ED Diagnoses Final diagnoses:  Forehead laceration, initial encounter  Minor head injury, initial encounter  Fall at home, initial encounter    Rx / DC Orders ED Discharge Orders     None         Fayrene Helper, PA-C 11/23/22 1845    Benjiman Core, MD 11/24/22 0006

## 2022-11-23 NOTE — ED Triage Notes (Signed)
Pt had a fall today from standing, hit her head on the floor, Denies loss of consciousness. Laceration to forehead.

## 2022-11-23 NOTE — Discharge Instructions (Signed)
You have been evaluated for your symptoms.  You suffered a laceration to your forehead that was sutured using absorbable sutures.  Please monitor wound closely to make sure it does not get infected.  Your sodium level is a bit low today, please follow-up with your doctor for recheck at your earliest convenience.  Return to the ER if you have any concern.  You may take over-the-counter Tylenol as needed for pain and apply Neosporin to the wound to decrease risk of infection.

## 2022-12-02 ENCOUNTER — Telehealth: Payer: Self-pay

## 2022-12-02 NOTE — Telephone Encounter (Signed)
Transition Care Management Unsuccessful Follow-up Telephone Call  Date of discharge and from where:  11/23/2022 [facility]  Attempts:  1st Attempt  Reason for unsuccessful TCM follow-up call:  Left voice message  [facility]  [facility] Community Resource Care Guide   [email or web address]  ?? [phone]   Website: [email or web address]  [email or web address]

## 2022-12-02 NOTE — Telephone Encounter (Signed)
Transition Care Management Follow-up Telephone Call Date of discharge and from where: 11/23/2022 [facility] How have you been since you were released from the hospital? Patient is feeling better. Any questions or concerns? No  Items Reviewed: Did the pt receive and understand the discharge instructions provided? Yes  Medications obtained and verified?  No medication prescribed. Other? No  Any new allergies since your discharge? No  Dietary orders reviewed? Yes Do you have support at home? Yes   Follow up appointments reviewed:  PCP Hospital f/u appt confirmed? No  Scheduled to see  on  @ . Specialist Hospital f/u appt confirmed? No  Scheduled to see  on  @ . Are transportation arrangements needed? No  If their condition worsens, is the pt aware to call PCP or go to the Emergency Dept.? Yes Was the patient provided with contact information for the PCP's office or ED? Yes Was to pt encouraged to call back with questions or concerns? Yes  Felicia Cross [facility]  [facility] Population Health Community Resource Care Guide   [email or web address]  ?? [phone]   Website: [email or web address]  [email or web address]

## 2022-12-04 ENCOUNTER — Other Ambulatory Visit: Payer: Self-pay | Admitting: Neurology

## 2022-12-04 ENCOUNTER — Other Ambulatory Visit: Payer: Self-pay | Admitting: Family Medicine

## 2022-12-05 ENCOUNTER — Other Ambulatory Visit: Payer: Self-pay | Admitting: Anesthesiology

## 2022-12-05 MED ORDER — MEMANTINE HCL 10 MG PO TABS: 10.0000 mg | ORAL_TABLET | Freq: Two times a day (BID) | ORAL | 2 refills | Status: DC

## 2022-12-05 NOTE — Addendum Note (Signed)
Addended by: Berneice Gandy A on: 12/05/2022 02:58 PM   Modules accepted: Orders

## 2022-12-14 ENCOUNTER — Ambulatory Visit: Payer: Medicare Other | Admitting: Podiatry

## 2022-12-14 DIAGNOSIS — M79674 Pain in right toe(s): Secondary | ICD-10-CM

## 2022-12-14 DIAGNOSIS — B351 Tinea unguium: Secondary | ICD-10-CM

## 2022-12-14 DIAGNOSIS — M79675 Pain in left toe(s): Secondary | ICD-10-CM | POA: Diagnosis not present

## 2022-12-14 NOTE — Progress Notes (Signed)
This patient presents to the office with chief complaint of long thick painful nails.  Patient says the nails are painful walking and wearing shoes.  This patient is unable to self treat.  This patient is unable to trim her nails since she is unable to reach her nails. She presents to the office with her daughter.  She presents to the office for preventative foot care services.  General Appearance  Alert, conversant and in no acute stress.  Vascular  Dorsalis pedis and posterior tibial  pulses are weakly  palpable  bilaterally.  Capillary return is within normal limits  bilaterally. Temperature is within normal limits  bilaterally.  Neurologic  Senn-Weinstein monofilament wire test within normal limits  bilaterally. Muscle power within normal limits bilaterally.  Nails Thick disfigured discolored nails with subungual debris  from hallux to fifth toes bilaterally. No evidence of bacterial infection or drainage bilaterally.  Orthopedic  No limitations of motion  feet .  No crepitus or effusions noted.  No bony pathology or digital deformities noted. HAV  B/L.  Skin  normotropic skin with no porokeratosis noted bilaterally.  No signs of infections or ulcers noted.     Onychomycosis  Nails  B/L.  Pain in right toes  Pain in left toes  Debridement of nails both feet followed trimming the nails with dremel tool.   RTC 10 weeks    Helane Gunther DPM

## 2022-12-21 ENCOUNTER — Emergency Department (HOSPITAL_COMMUNITY)
Admission: EM | Admit: 2022-12-21 | Discharge: 2022-12-21 | Disposition: A | Discharge status: Home / Self Care | Payer: Medicare Other

## 2022-12-21 ENCOUNTER — Other Ambulatory Visit: Payer: Self-pay

## 2022-12-21 DIAGNOSIS — I10 Essential (primary) hypertension: Secondary | ICD-10-CM | POA: Diagnosis not present

## 2022-12-21 DIAGNOSIS — I2510 Atherosclerotic heart disease of native coronary artery without angina pectoris: Secondary | ICD-10-CM | POA: Insufficient documentation

## 2022-12-21 DIAGNOSIS — Z79899 Other long term (current) drug therapy: Secondary | ICD-10-CM | POA: Insufficient documentation

## 2022-12-21 DIAGNOSIS — R6889 Other general symptoms and signs: Secondary | ICD-10-CM | POA: Diagnosis not present

## 2022-12-21 DIAGNOSIS — E871 Hypo-osmolality and hyponatremia: Secondary | ICD-10-CM | POA: Insufficient documentation

## 2022-12-21 DIAGNOSIS — Z7982 Long term (current) use of aspirin: Secondary | ICD-10-CM | POA: Diagnosis not present

## 2022-12-21 DIAGNOSIS — Z743 Need for continuous supervision: Secondary | ICD-10-CM | POA: Diagnosis not present

## 2022-12-21 LAB — CBC
HCT: 38 % (ref 36.0–46.0)
Hemoglobin: 12.4 g/dL (ref 12.0–15.0)
MCH: 32.2 pg (ref 26.0–34.0)
MCHC: 32.6 g/dL (ref 30.0–36.0)
MCV: 98.7 fL (ref 80.0–100.0)
Platelets: 222 10*3/uL (ref 150–400)
RBC: 3.85 MIL/uL — ABNORMAL LOW (ref 3.87–5.11)
RDW: 13.7 % (ref 11.5–15.5)
WBC: 5.2 10*3/uL (ref 4.0–10.5)
nRBC: 0 % (ref 0.0–0.2)

## 2022-12-21 LAB — BASIC METABOLIC PANEL
Anion gap: 11 (ref 5–15)
BUN: 10 mg/dL (ref 8–23)
CO2: 25 mmol/L (ref 22–32)
Calcium: 9.9 mg/dL (ref 8.9–10.3)
Chloride: 94 mmol/L — ABNORMAL LOW (ref 98–111)
Creatinine, Ser: 1.02 mg/dL — ABNORMAL HIGH (ref 0.44–1.00)
GFR, Estimated: 53 mL/min — ABNORMAL LOW (ref >60)
Glucose, Bld: 83 mg/dL (ref 70–99)
Potassium: 4.9 mmol/L (ref 3.5–5.1)
Sodium: 130 mmol/L — ABNORMAL LOW (ref 135–145)

## 2022-12-21 NOTE — Discharge Instructions (Signed)
It was a pleasure taking part in your care today.  As we discussed, your workup was reassuring.  The labs that we drew showed baseline lab values that are unchanged.  Please follow-up with the patient's PCP this week for further management and possible titration of the patient's blood pressure medication.  Please continue taking blood pressure log so the patient blood pressures can be trended.  Please utilize the attached form/blood pressure record sheet to record patient blood pressures.  Please return to the ED with any new or worsening signs or symptoms such as chest pain, shortness of breath, headache or blurred vision.

## 2022-12-21 NOTE — ED Notes (Signed)
Pt verbalized understanding of discharge instructions, Daughter also verbalized understanding. Patient discharged home with daughter.

## 2022-12-21 NOTE — ED Triage Notes (Signed)
Per EMS, pts BP has been trending high the last couple of days. Usually patient takes her BP before taking her meds and it was in 200/90s after taking her medications it went down to 187/102.   Hx of Left sided deficit which is her baseline.

## 2022-12-21 NOTE — ED Provider Notes (Signed)
[place] EMERGENCY DEPARTMENT AT [facility] Provider Note   CSN: [id number] Arrival date & time: 12/21/22  1147     History  Chief Complaint  Patient presents with   Hypertension    Felicia Cross is a 89 y.o. female with medical history hypertension, CVD, CAD, syncope, seizures, vertigo.  Patient presents to ED for evaluation of hypertension.  Patient here with home health aide and daughter.  Home health aide reports the patient blood pressure this morning was elevated at 210/115.  Patient home health aide states that this was prior to giving home blood pressure medications.  Patient states that she takes Cozaar, spironolactone, metoprolol for high blood pressure.  Patient aide states that after blood pressure was taken, the patient aide gave the patient her medications.  The patient blood pressure is currently 153/95 in the room.  The patient denies any chest pain, shortness of breath, blurred vision, headache, nausea vomiting, diarrhea, abdominal pain, one-sided weakness or numbness beyond her baseline.  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  Patient daughter in the room states that the patient blood pressure is typically between 170 and 190 systolic.  Patient daughter reports that the patient is at her baseline mental status.   Hypertension Pertinent negatives include no chest pain, no headaches and no shortness of breath.       Home Medications Prior to Admission medications   Medication Sig Start Date End Date Taking? Authorizing Provider  acetaminophen (TYLENOL) 325 MG tablet Take 975 mg by mouth See admin instructions. Take 3 tablets (975 mg) by mouth three times daily, may also take 2 tablets (650 mg) by mouth every 6 hours as needed for moderate pain/headache    [provider]  aspirin EC 81 MG tablet Take 1 tablet (81 mg total) by mouth daily. Patient taking differently: Take 81 mg by mouth every other day. 08/18/16   Terressa Koyanagi,  DO  Blood Pressure Monitoring (BLOOD PRESSURE CUFF) MISC Use as directed for checking bp daily. 09/29/21   Deeann Saint, MD  levETIRAcetam (KEPPRA) 500 MG tablet Keppra 500 mg twice daily x6 weeks, then to 250mg  BID x 1 week 07/27/21   Maretta Bees, MD  losartan (COZAAR) 100 MG tablet Take 1 tablet by mouth once daily 12/05/22   Deeann Saint, MD  memantine (NAMENDA) 10 MG tablet Take 1 tablet (10 mg total) by mouth 2 (two) times daily. TAKE 1 TABLET BY MOUTH TWICE DAILY . 12/05/22   Micki Riley, MD  metoprolol tartrate (LOPRESSOR) 25 MG tablet Take 1 tablet by mouth twice daily 09/23/22   Deeann Saint, MD  Multiple Vitamin-Folic Acid TABS Take 1 tablet by mouth every morning.    [provider]  simvastatin (ZOCOR) 40 MG tablet Take 1 tablet (40 mg total) by mouth daily. 11/09/22   Deeann Saint, MD  spironolactone (ALDACTONE) 25 MG tablet Take 1 tablet by mouth once daily 11/02/22   Deeann Saint, MD  vitamin B-12 (CYANOCOBALAMIN) 1000 MCG tablet Take 1 tablet (1,000 mcg total) by mouth daily. 07/27/21   Ghimire, Werner Lean, MD      Allergies    Codeine and Hydrocodone    Review of Systems   Review of Systems  Constitutional:  Negative for fever.  Eyes:  Negative for visual disturbance.  Respiratory:  Negative for shortness of breath.   Cardiovascular:  Negative for chest pain.  Neurological:  Negative for weakness, numbness and headaches.  All other systems reviewed and are negative.   Physical Exam Updated Vital Signs BP (!) 160/80   Pulse 64   Temp 97.6 F (36.4 C) (Oral)   Resp 13   Ht 5\' 4"  (1.626 m)   Wt 68 kg   SpO2 100%   BMI 25.75 kg/m  Physical Exam Vitals and nursing note reviewed.  Constitutional:      General: She is not in acute distress.    Appearance: Normal appearance. She is not ill-appearing, toxic-appearing or diaphoretic.  HENT:     Head: Normocephalic and atraumatic.     Nose: Nose normal.     Mouth/Throat:     Mouth:  Mucous membranes are moist.     Pharynx: Oropharynx is clear.  Eyes:     Extraocular Movements: Extraocular movements intact.     Conjunctiva/sclera: Conjunctivae normal.     Pupils: Pupils are equal, round, and reactive to light.  Cardiovascular:     Rate and Rhythm: Normal rate and regular rhythm.  Pulmonary:     Effort: Pulmonary effort is normal.     Breath sounds: Normal breath sounds. No wheezing.  Abdominal:     General: Abdomen is flat. Bowel sounds are normal.     Palpations: Abdomen is soft.     Tenderness: There is no abdominal tenderness.  Musculoskeletal:     Cervical back: Normal range of motion and neck supple. No tenderness.  Skin:    General: Skin is warm and dry.     Capillary Refill: Capillary refill takes less than 2 seconds.  Neurological:     General: No focal deficit present.     Mental Status: She is alert and oriented to person, place, and time.     GCS: GCS eye subscore is 4. GCS verbal subscore is 5. GCS motor subscore is 6.     Cranial Nerves: Cranial nerves 2-12 are intact. No cranial nerve deficit.     Sensory: Sensation is intact. No sensory deficit.     Motor: Motor function is intact. No weakness.     Coordination: Coordination is intact. Heel to [facility] Test normal.     ED Results / Procedures / Treatments   Labs (all labs ordered are listed, but only abnormal results are displayed) Labs Reviewed  CBC - Abnormal; Notable for the following components:      Result Value   RBC 3.85 (*)    All other components within normal limits  BASIC METABOLIC PANEL - Abnormal; Notable for the following components:   Sodium 130 (*)    Chloride 94 (*)    Creatinine, Ser 1.02 (*)    GFR, Estimated 53 (*)    All other components within normal limits    EKG None  Radiology No results found.  Procedures Procedures   Medications Ordered in ED Medications - No data to display  ED Course/ Medical Decision Making/ A&P  Medical Decision Making Amount  and/or Complexity of Data Reviewed Labs: ordered.   89 year old female presents to the ED for evaluation.  Please see HPI for further details.  On examination the patient is afebrile, nontachycardic.  Her lung sounds are clear bilaterally and she is not hypoxic on room air.  Her abdomen is soft and compressible throughout.  Neurological examination is at baseline without focal neurodeficits.  Patient is overall nontoxic in appearance.  Patient follows commands appropriately.  Patient lifts all 4 extremity spontaneously and with intent and purpose.  Patient CBC shows no leukocytosis, no  anemia.  Patient metabolic panel shows hyponatremia to 130, creatinine 1.02, anion gap 11.  Patient creatinine baseline.  On chart view, it appears that the patient is chronically hyponatremic.  Patient EKG is nonischemic.  At this time, patient blood pressure is normalized to baseline.  Her blood pressure is currently 176/83.  The patient daughter reports that this is typically where the patient blood pressure typically runs.  Patient denies any chest pain, shortness of breath, headache or blurred vision.  Patient has asymptomatic hypertension.  At this time, patient labs checked.  Patient labs are reassuring.  Patient labs at baseline.  The patient will be advised to continue taking blood pressure log and follow-up with her PCP for reevaluation and possible titration of blood pressure medications.  The patient, the patient ate and the patient daughter were advised that the patient should return to the ED in the event that she is having high blood pressure in the setting of headache, blurred vision, chest pain or shortness of breath.  Patient, patient aide and patient daughter voiced understanding of instructions.  Patient and patient daughter had opportunity to ask all questions to their satisfaction.  The patient is stable to discharge home at this time.   Final Clinical Impression(s) / ED Diagnoses Final diagnoses:   Hypertension, unspecified type    Rx / DC Orders ED Discharge Orders     None         Al Decant, PA-C 12/21/22 1415    Coral Spikes, DO 12/21/22 [phone]

## 2022-12-24 ENCOUNTER — Other Ambulatory Visit: Payer: Self-pay | Admitting: Family Medicine

## 2022-12-27 ENCOUNTER — Telehealth: Payer: Self-pay

## 2022-12-27 NOTE — Transitions of Care (Post Inpatient/ED Visit) (Signed)
12/27/2022  Name: Felicia Cross MRN: [id number] DOB: [birth year]  Today's TOC FU Call Status: Today's TOC FU Call Status:: Successful TOC FU Call Completed TOC FU Call Complete Date: 12/27/22  Red on EMMI-ED Discharge Alert Date & Reason:12/23/22 "Scheduled follow-up appt? No"  Transition Care Management Follow-up Telephone Call Date of Discharge: 12/21/22 Discharge Facility: [facility]) Type of Discharge: Emergency Department Reason for ED Visit: Other: ("hypertension,unspecified") How have you been since you were released from the hospital?: Same (Spouse voices pt is 'doing about the same-not available to talk at this time-aide in the home." He is unsure of BP readings as aide checks it.) Any questions or concerns?: No  Items Reviewed: Did you receive and understand the discharge instructions provided?: Yes Medications obtained,verified, and reconciled?: Partial Review Completed Reason for Partial Mediation Review: spouse did not wish to review all m,meds Any new allergies since your discharge?: No Dietary orders reviewed?: Yes Type of Diet Ordered:: low salt/heart healthy Do you have support at home?: Yes People in Home: spouse, child(ren), adult Name of Support/Comfort Primary Source: paid aide the home to assist pt as well  Medications Reviewed Today: Medications Reviewed Today     Reviewed by Charlyn Minerva, RN (Registered Nurse) on 12/27/22 at 1043  Med List Status: <None>   Medication Order Taking? Sig Documenting Provider Last Dose Status Informant  acetaminophen (TYLENOL) 325 MG tablet [id number]  Take 975 mg by mouth See admin instructions. Take 3 tablets (975 mg) by mouth three times daily, may also take 2 tablets (650 mg) by mouth every 6 hours as needed for moderate pain/headache [provider]  Active Nursing Home Medication Administration Guide (MAG)           Med Note Vickey Sages, HEATHER L   Sun Aug 01, 2021  3:02 PM) Last PRN dose  07/29/21  aspirin EC 81 MG tablet [id number]  Take 1 tablet (81 mg total) by mouth daily.  Patient taking differently: Take 81 mg by mouth every other day.   Terressa Koyanagi, DO  Active Nursing Home Medication Administration Guide (MAG)  Blood Pressure Monitoring (BLOOD PRESSURE CUFF) MISC [id number]  Use as directed for checking bp daily. Deeann Saint, MD  Active   levETIRAcetam (KEPPRA) 500 MG tablet [id number]  Keppra 500 mg twice daily x6 weeks, then to 250mg  BID x 1 week Maretta Bees, MD  Active Nursing Home Medication Administration Guide (MAG)           Med Note Felecia Shelling Aug 01, 2021  2:58 PM) 6 week course started 07/27/21  losartan (COZAAR) 100 MG tablet [id number] Yes Take 1 tablet by mouth once daily Deeann Saint, MD Taking Active   memantine (NAMENDA) 10 MG tablet [id number]  Take 1 tablet (10 mg total) by mouth 2 (two) times daily. TAKE 1 TABLET BY MOUTH TWICE DAILY . Micki Riley, MD  Active   metoprolol tartrate (LOPRESSOR) 25 MG tablet [id number] Yes Take 1 tablet by mouth twice daily Deeann Saint, MD Taking Active   Multiple Vitamin-Folic Acid TABS [id number]  Take 1 tablet by mouth every morning. [provider]  Active Nursing Home Medication Administration Guide (MAG)  simvastatin (ZOCOR) 40 MG tablet [id number]  Take 1 tablet (40 mg total) by mouth daily. Deeann Saint, MD  Active   spironolactone (ALDACTONE) 25 MG tablet [id number] Yes Take 1 tablet by mouth once daily Deeann Saint, MD Taking Active  vitamin B-12 (CYANOCOBALAMIN) 1000 MCG tablet [id number]  Take 1 tablet (1,000 mcg total) by mouth daily. Ghimire, Werner Lean, MD  Active Nursing Home Medication Administration Guide (MAG)  Med List Note Gorden Harms, CPhT 08/01/21 1409): Resident of [facility] [phone]            Home Care and Equipment/Supplies: Were Home Health Services Ordered?: NA Any new equipment or medical supplies ordered?:  NA  Functional Questionnaire: Do you need assistance with bathing/showering or dressing?: Yes Do you need assistance with meal preparation?: Yes Do you need assistance with eating?: No Do you have difficulty maintaining continence: Yes Do you need assistance with getting out of bed/getting out of a chair/moving?: Yes Do you have difficulty managing or taking your medications?: Yes  Follow up appointments reviewed: PCP Follow-up appointment confirmed?: No (Spouse did not want to make an appt during this call-encouraged to make an appt with provider for folow up and further mgmt of BP) MD Provider Line Number:[phone] Given: No Specialist Hospital Follow-up appointment confirmed?: No Do you need transportation to your follow-up appointment?: No Do you understand care options if your condition(s) worsen?: Yes-patient verbalized understanding   TOC Interventions Today    Flowsheet Row Most Recent Value  TOC Interventions   TOC Interventions Discussed/Reviewed TOC Interventions Discussed      Interventions Today    Flowsheet Row Most Recent Value  Chronic Disease   Chronic disease during today's visit Hypertension (HTN)  General Interventions   General Interventions Discussed/Reviewed General Interventions Discussed, Durable Medical Equipment (DME)  [instructed onn importance of monitoring BP, recording and taking to MD appts-spouse voices that aide checks BP-he is unsure of readings]  Durable Medical Equipment (DME) BP Cuff  Education Interventions   Education Provided Provided Education  Provided Verbal Education On When to see the doctor, Medication, Nutrition, Other  Nutrition Interventions   Nutrition Discussed/Reviewed Nutrition Discussed, Adding fruits and vegetables, Decreasing salt, Decreasing fats, Increasing proteins  Pharmacy Interventions   Pharmacy Dicussed/Reviewed Pharmacy Topics Discussed, Medications and their functions        Alessandra Grout [facility] Care Management Care Management Community Coordinator Direct Phone: [phone] Toll Free: [phone] Fax: [phone]

## 2022-12-31 ENCOUNTER — Other Ambulatory Visit: Payer: Self-pay | Admitting: Family Medicine

## 2023-01-03 ENCOUNTER — Telehealth: Payer: Self-pay | Admitting: Family Medicine

## 2023-01-03 NOTE — Telephone Encounter (Signed)
Pt called and would like a prescription called in for a UTI.  [facility] [address] [address] [place], [place] [address] [phone] fax  Please advise patient on status.

## 2023-01-05 NOTE — Telephone Encounter (Signed)
Called pt to schedule an appointment, no answer and VM was full.

## 2023-01-05 NOTE — Telephone Encounter (Signed)
Pt needs to be seen

## 2023-01-06 ENCOUNTER — Ambulatory Visit: Payer: Medicare Other | Admitting: Family Medicine

## 2023-01-06 ENCOUNTER — Encounter: Payer: Self-pay | Admitting: Family Medicine

## 2023-01-06 VITALS — BP 124/74 | HR 65 | Temp 98.3°F | Ht 64.0 in | Wt 159.0 lb

## 2023-01-06 DIAGNOSIS — H6123 Impacted cerumen, bilateral: Secondary | ICD-10-CM

## 2023-01-06 DIAGNOSIS — E860 Dehydration: Secondary | ICD-10-CM

## 2023-01-06 DIAGNOSIS — R300 Dysuria: Secondary | ICD-10-CM

## 2023-01-06 DIAGNOSIS — I10 Essential (primary) hypertension: Secondary | ICD-10-CM | POA: Diagnosis not present

## 2023-01-06 LAB — POC URINALSYSI DIPSTICK (AUTOMATED)
Bilirubin, UA: NEGATIVE
Blood, UA: NEGATIVE
Glucose, UA: NEGATIVE
Ketones, UA: NEGATIVE
Leukocytes, UA: NEGATIVE
Nitrite, UA: NEGATIVE
Phenol/SG 1.024: 1.02
Protein, UA: NEGATIVE
Urobilinogen, UA: 0.2 E.U./dL
pH, UA: 6 (ref 5.0–8.0)

## 2023-01-06 NOTE — Telephone Encounter (Signed)
Pt is scheduled to come in today. 

## 2023-01-06 NOTE — Addendum Note (Signed)
Addended by: Philipp Deputy A on: 01/06/2023 02:25 PM   Modules accepted: Orders

## 2023-01-06 NOTE — Progress Notes (Signed)
Established Patient Office Visit   Subjective  Patient ID: Felicia Cross, female    DOB: Sep 15, 1932  Age: 89 y.o. MRN: [id number]  Chief Complaint  Patient presents with   Urinary Tract Infection    Patient came in today for possible UTI started a week ago, patient would also like her ear check    Patient is an 89 year old female seen for acute concern.  Patient with dysuria x 1 week.  Also notes decreased appetite.  Difficult to pin down pt on water intake.  May be drinking 8-10 ounces of water per day per caregiver.  Patient with decreased hearing.  Would like ears checked.  Patient was seen in ED on 12/21/2022 for elevated BP.  Systolic was in 200s at home that morning prior to taking medication.  BP continued to improve after taking medication.  Per patient's son patient ate breakfast for dinner the night before which included grits, eggs, bacon.  Thinks foods may have been overly seasoned by family members that cooked it.  Patient currently taking spironolactone 25 mg daily, losartan 100 mg daily, metoprolol to tartrate 25 mg twice daily.    Last seen at the end of 2023 by this provider.  Since then she has had several falls.  One of which required sutures.    Past Medical History:  Diagnosis Date   Anemia 01/18/2016   -reports on and off her whole life, on oral iron in the past remotely per report   Arthritis    B12 deficiency 01/18/2016   CAD (coronary artery disease)    CVD (cerebrovascular disease)    Diverticulosis of colon    H. pylori infection 10/20/2015   Hyperlipidemia    Hypertension    Osteoarthritis 03/07/2006   Qualifier: Diagnosis of  By: Cato Mulligan MD, Bruce     Past Surgical History:  Procedure Laterality Date   ABDOMINAL HYSTERECTOMY  1984   REPLACEMENT TOTAL KNEE  2007   left   TOTAL HIP ARTHROPLASTY  2010   Dr. Turner Daniels, right   TOTAL KNEE ARTHROPLASTY Right 09/14/2015   Procedure: TOTAL KNEE ARTHROPLASTY;  Surgeon: Dannielle Huh, MD;  Location: [facility] OR;   Service: Orthopedics;  Laterality: Right;   Social History   Tobacco Use   Smoking status: Former    Current packs/day: 0.00    Average packs/day: 1 pack/day for 30.0 years (30.0 ttl pk-yrs)    Types: Cigarettes    Start date: 11/02/1950    Quit date: 11/01/1980    Years since quitting: 42.2   Smokeless tobacco: Never  Vaping Use   Vaping status: Never Used  Substance Use Topics   Alcohol use: Not Currently    Comment: glass of wine   Drug use: No   Family History  Problem Relation Age of Onset   Diabetes Mother    Hypertension Father    Kidney disease Father    Heart attack Sister    Heart attack Brother    Colon cancer Neg Hx    Stomach cancer Neg Hx    Esophageal cancer Neg Hx    Rectal cancer Neg Hx    Allergies  Allergen Reactions   Codeine Nausea And Vomiting and Nausea Only   Hydrocodone Nausea And Vomiting      ROS Negative unless stated above    Objective:     BP 124/74 (BP Location: Right Arm, Patient Position: Sitting, Cuff Size: Normal)   Pulse 65   Temp 98.3 F (36.8 C) (Oral)  Ht 5\' 4"  (1.626 m)   Wt 159 lb (72.1 kg)   SpO2 99%   BMI 27.29 kg/m  BP Readings from Last 3 Encounters:  01/06/23 124/74  12/21/22 (!) 170/83  11/23/22 (!) 161/60   Wt Readings from Last 3 Encounters:  01/06/23 159 lb (72.1 kg)  12/21/22 150 lb (68 kg)  11/23/22 150 lb (68 kg)      Physical Exam Constitutional:      General: She is not in acute distress.    Appearance: Normal appearance.  HENT:     Head: Normocephalic and atraumatic.     Right Ear: Decreased hearing noted.     Left Ear: Decreased hearing noted.     Ears:     Comments: Bilateral canals occluded with cerumen.  Ears irrigated.  Curette and alligator forceps used by this provider to remove impacted cerumen.  TMs normal after irrigation and use of instrumentation to remove cerumen.    Nose: Nose normal.     Mouth/Throat:     Mouth: Mucous membranes are moist.  Eyes:     Extraocular  Movements: Extraocular movements intact.     Conjunctiva/sclera: Conjunctivae normal.  Cardiovascular:     Rate and Rhythm: Normal rate and regular rhythm.     Heart sounds: Normal heart sounds. No murmur heard.    No gallop.  Pulmonary:     Effort: Pulmonary effort is normal. No respiratory distress.     Breath sounds: Normal breath sounds. No wheezing, rhonchi or rales.  Musculoskeletal:     Comments: LUE paresis and contracture of left hand.  Skin:    General: Skin is warm and dry.  Neurological:     Mental Status: She is alert and oriented to person, place, and time. Mental status is at baseline.     Comments: Sitting in transport wheelchair.      Results for orders placed or performed in visit on 01/06/23  POCT Urinalysis Dipstick (Automated)  Result Value Ref Range   Color, UA yellow    Clarity, UA clear    Glucose, UA Negative Negative   Bilirubin, UA neg    Ketones, UA neg    Phenol/SG 1.024 1.020    Blood, UA neg    pH, UA 6.0 5.0 - 8.0   Protein, UA Negative Negative   Urobilinogen, UA 0.2 0.2 or 1.0 E.U./dL   Nitrite, UA neg    Leukocytes, UA Negative Negative      Assessment & Plan:  Dysuria -     POCT Urinalysis Dipstick (Automated)  Bilateral impacted cerumen  Essential hypertension -Well-controlled this visit. -Continue spironolactone 25 mg daily, metoprolol tartrate 25 mg twice daily, losartan 100 mg daily -Stressed the importance of lifestyle modifications  Dehydration  Consent obtained. Bilateral ears irrigated.  Patient tolerated procedure well.  Cerumen remained after irrigation.  Instrumentation with curette and alligator forceps used by this provider to remove large amounts of cerumen from bilateral canals.  Hearing improved after removal.  Consider OTC Debrox eardrops.  Acute issue of dysuria likely 2/2 concentrated urine from dehydration.  Patient given copious amounts of fluids to drink during visit in order to obtain enough urine for  sample.  UA negative for overt signs of infection.  Will culture.  Discussed with patient, family, caregiver the importance of hydration.  Advised increasing water intake would likely help with BP along with diet changes.  For continued elevation in BP consistently greater than 140/90 discussed adjusting medications.  Consider taking spironolactone  in evening along with 2nd dose of metoprolol if elevations in am continue.  Would be hesitant to increase his metoprolol dose due to heart rate.   On day of service, 191 minutes spent caring for this patient face-to-face, reviewing the chart, counseling and/or coordinating care for plan and treatment of diagnosis below.  Pt's appointment was at 11am however she did not leave until 2:11 PM due to being unable to provide urine sample.  Pt given 40+ oz of water and an ensure.    Return in about 3 months (around 04/08/2023) for blood pressure re-check.   Deeann Saint, MD

## 2023-01-06 NOTE — Patient Instructions (Signed)
Work on increasing your intake of water and fluids.

## 2023-01-07 LAB — URINE CULTURE
MICRO NUMBER:: 15405813
SPECIMEN QUALITY:: ADEQUATE

## 2023-02-05 ENCOUNTER — Other Ambulatory Visit: Payer: Self-pay | Admitting: Family Medicine

## 2023-02-05 DIAGNOSIS — I10 Essential (primary) hypertension: Secondary | ICD-10-CM

## 2023-02-20 ENCOUNTER — Ambulatory Visit: Payer: Medicare Other | Admitting: Podiatry

## 2023-02-28 ENCOUNTER — Telehealth: Payer: Self-pay | Admitting: Family Medicine

## 2023-02-28 NOTE — Telephone Encounter (Signed)
Daughter, Marcelino Duster is requesting Rx for [facility] aide, for reimbursement, as per Medicare.

## 2023-03-01 ENCOUNTER — Other Ambulatory Visit: Payer: Self-pay | Admitting: Neurology

## 2023-03-02 NOTE — Telephone Encounter (Signed)
Not sure Medicare covers home health aides.  They may also require a recent visit in the last 90 days.  Check with referral coordinator regarding these details.

## 2023-03-03 NOTE — Telephone Encounter (Signed)
I do not know what this is/what is needed.

## 2023-03-03 NOTE — Telephone Encounter (Signed)
Actually, Pt is already receiving HH aide.  Daughter is just requesting "a prescription" for reimbursement, as per a conversation she had with Medicare.  Respectfully.

## 2023-03-06 NOTE — Telephone Encounter (Signed)
Spoke with patient's daughter , patient is coming in for an appt Nov 20

## 2023-03-15 ENCOUNTER — Ambulatory Visit: Payer: Medicare Other | Admitting: Podiatry

## 2023-03-18 ENCOUNTER — Other Ambulatory Visit: Payer: Self-pay | Admitting: Family Medicine

## 2023-03-18 DIAGNOSIS — I10 Essential (primary) hypertension: Secondary | ICD-10-CM

## 2023-03-20 ENCOUNTER — Ambulatory Visit: Payer: Medicare Other | Admitting: Podiatry

## 2023-03-24 ENCOUNTER — Ambulatory Visit: Payer: Medicare Other | Admitting: Family Medicine

## 2023-03-25 ENCOUNTER — Other Ambulatory Visit: Payer: Self-pay | Admitting: Family Medicine

## 2023-03-31 ENCOUNTER — Ambulatory Visit: Payer: Medicare Other | Admitting: Family Medicine

## 2023-04-03 ENCOUNTER — Other Ambulatory Visit: Payer: Self-pay | Admitting: Neurology

## 2023-04-21 ENCOUNTER — Ambulatory Visit: Payer: Medicare Other | Admitting: Podiatry

## 2023-04-27 ENCOUNTER — Other Ambulatory Visit: Payer: Self-pay | Admitting: Family Medicine

## 2023-04-28 ENCOUNTER — Ambulatory Visit: Payer: Medicare Other | Admitting: Family Medicine

## 2023-04-28 ENCOUNTER — Telehealth: Payer: Self-pay | Admitting: Family Medicine

## 2023-04-28 ENCOUNTER — Encounter: Payer: Self-pay | Admitting: Podiatry

## 2023-04-28 ENCOUNTER — Ambulatory Visit (INDEPENDENT_AMBULATORY_CARE_PROVIDER_SITE_OTHER): Payer: Medicare Other | Admitting: Podiatry

## 2023-04-28 ENCOUNTER — Encounter: Payer: Self-pay | Admitting: Family Medicine

## 2023-04-28 VITALS — BP 132/82 | HR 61 | Temp 98.9°F | Ht 64.0 in | Wt 154.0 lb

## 2023-04-28 DIAGNOSIS — I10 Essential (primary) hypertension: Secondary | ICD-10-CM

## 2023-04-28 DIAGNOSIS — K5900 Constipation, unspecified: Secondary | ICD-10-CM

## 2023-04-28 DIAGNOSIS — M79671 Pain in right foot: Secondary | ICD-10-CM | POA: Diagnosis not present

## 2023-04-28 DIAGNOSIS — R2689 Other abnormalities of gait and mobility: Secondary | ICD-10-CM

## 2023-04-28 DIAGNOSIS — R531 Weakness: Secondary | ICD-10-CM

## 2023-04-28 DIAGNOSIS — M79675 Pain in left toe(s): Secondary | ICD-10-CM

## 2023-04-28 DIAGNOSIS — B351 Tinea unguium: Secondary | ICD-10-CM | POA: Diagnosis not present

## 2023-04-28 DIAGNOSIS — M25622 Stiffness of left elbow, not elsewhere classified: Secondary | ICD-10-CM

## 2023-04-28 DIAGNOSIS — M79674 Pain in right toe(s): Secondary | ICD-10-CM | POA: Diagnosis not present

## 2023-04-28 DIAGNOSIS — G309 Alzheimer's disease, unspecified: Secondary | ICD-10-CM

## 2023-04-28 DIAGNOSIS — F0150 Vascular dementia without behavioral disturbance: Secondary | ICD-10-CM

## 2023-04-28 DIAGNOSIS — I779 Disorder of arteries and arterioles, unspecified: Secondary | ICD-10-CM | POA: Diagnosis not present

## 2023-04-28 DIAGNOSIS — R7303 Prediabetes: Secondary | ICD-10-CM | POA: Diagnosis not present

## 2023-04-28 DIAGNOSIS — M1990 Unspecified osteoarthritis, unspecified site: Secondary | ICD-10-CM

## 2023-04-28 DIAGNOSIS — F0280 Dementia in other diseases classified elsewhere without behavioral disturbance: Secondary | ICD-10-CM

## 2023-04-28 MED ORDER — SENNOSIDES-DOCUSATE SODIUM 8.6-50 MG PO TABS: 1.0000 | ORAL_TABLET | Freq: Every day | ORAL | 11 refills | Status: DC

## 2023-04-28 NOTE — Progress Notes (Unsigned)
Established Patient Office Visit   Subjective  Patient ID: Felicia Cross, female    DOB: 1933-03-31  Age: 89 y.o. MRN: [id number]  Chief Complaint  Patient presents with   Medical Management of Chronic Issues    FL2 form   Pt accompanied by her caregiver and her son.  Pt's daughter [name] on the phone.  Patient is an 89 year old female seen for administrative encounter.  Requesting completion of form for reimbursement of home health.  Patient doing some exercises every few days  daily.   Patient Active Problem List   Diagnosis Date Noted   Seizure (HCC) 07/24/2021   Syncope 07/23/2021   Hyponatremia 07/23/2021   Traumatic hematoma of forehead 07/23/2021   Frequent falls 12/29/2020   Mixed hyperlipidemia 12/29/2020   Elbow arthritis 08/26/2020   Vertigo 07/06/2018   B12 deficiency 01/18/2016   Anemia 01/18/2016   S/P total knee replacement 09/14/2015   Right knee pain 11/21/2014   Carotid arterial disease (HCC) 05/27/2009   Dyslipidemia 06/27/2008   Coronary atherosclerosis 11/06/2006   Essential hypertension 03/07/2006   Osteoarthritis 03/07/2006   Past Medical History:  Diagnosis Date   Anemia 01/18/2016   -reports on and off her whole life, on oral iron in the past remotely per report   Arthritis    B12 deficiency 01/18/2016   CAD (coronary artery disease)    CVD (cerebrovascular disease)    Diverticulosis of colon    H. pylori infection 10/20/2015   Hyperlipidemia    Hypertension    Osteoarthritis 03/07/2006   Qualifier: Diagnosis of  By: Cato Mulligan MD, Bruce     Past Surgical History:  Procedure Laterality Date   ABDOMINAL HYSTERECTOMY  1984   REPLACEMENT TOTAL KNEE  2007   left   TOTAL HIP ARTHROPLASTY  2010   Dr. Turner Daniels, right   TOTAL KNEE ARTHROPLASTY Right 09/14/2015   Procedure: TOTAL KNEE ARTHROPLASTY;  Surgeon: Dannielle Huh, MD;  Location: [facility] OR;  Service: Orthopedics;  Laterality: Right;   Social History   Tobacco Use   Smoking status: Former     Current packs/day: 0.00    Average packs/day: 1 pack/day for 30.0 years (30.0 ttl pk-yrs)    Types: Cigarettes    Start date: 11/02/1950    Quit date: 11/01/1980    Years since quitting: 42.5   Smokeless tobacco: Never  Vaping Use   Vaping status: Never Used  Substance Use Topics   Alcohol use: Not Currently    Comment: glass of wine   Drug use: No   Family History  Problem Relation Age of Onset   Diabetes Mother    Hypertension Father    Kidney disease Father    Heart attack Sister    Heart attack Brother    Colon cancer Neg Hx    Stomach cancer Neg Hx    Esophageal cancer Neg Hx    Rectal cancer Neg Hx    Allergies  Allergen Reactions   Codeine Nausea And Vomiting and Nausea Only   Hydrocodone Nausea And Vomiting      ROS Negative unless stated above    Objective:     BP 132/82 (BP Location: Right Arm, Patient Position: Sitting, Cuff Size: Large)   Pulse 61   Temp 98.9 F (37.2 C) (Oral)   Ht 5\' 4"  (1.626 m)   Wt 154 lb (69.9 kg)   SpO2 97%   BMI 26.43 kg/m  BP Readings from Last 3 Encounters:  04/28/23 132/82  01/06/23 124/74  12/21/22 (!) 170/83   Wt Readings from Last 3 Encounters:  04/28/23 154 lb (69.9 kg)  01/06/23 159 lb (72.1 kg)  12/21/22 150 lb (68 kg)      Physical Exam Constitutional:      General: She is not in acute distress.    Appearance: Normal appearance.  HENT:     Head: Normocephalic and atraumatic.     Nose: Nose normal.     Mouth/Throat:     Mouth: Mucous membranes are moist.  Cardiovascular:     Rate and Rhythm: Normal rate and regular rhythm.     Heart sounds: Normal heart sounds. No murmur heard.    No gallop.  Pulmonary:     Effort: Pulmonary effort is normal. No respiratory distress.     Breath sounds: Normal breath sounds. No wheezing, rhonchi or rales.  Skin:    General: Skin is warm and dry.  Neurological:     Mental Status: She is alert and oriented to person, place, and time.     Comments: Gait not  assessed as sitting in transport wheelchair.  Left UE weakness, contracture.        No results found for any visits on 04/28/23.    Assessment & Plan:  Constipation, unspecified constipation type -     Sennosides-Docusate Sodium; Take 1 tablet by mouth daily.  Dispense: 30 tablet; Refill: 11  Essential hypertension -     Comprehensive metabolic panel -     TSH -     Lipid panel  Bilateral carotid artery disease, unspecified type (HCC) -     Lipid panel  Osteoarthritis, unspecified osteoarthritis type, unspecified site  Pain of right heel -     CBC with Differential/Platelet  Prediabetes -     Hemoglobin A1c  Left-sided weakness  Mixed dementia (HCC)  Decreased range of motion of elbow, left  Decreased mobility    Return in about 5 months (around 09/26/2023), or if symptoms worsen or fail to improve.   Deeann Saint, MD

## 2023-04-28 NOTE — Progress Notes (Addendum)
This patient presents to the office with chief complaint of long thick painful nails.  Patient says the nails are painful walking and wearing shoes.  This patient is unable to self treat.  This patient is unable to trim her nails since she is unable to reach her nails. She presents to the office with her daughter.  She presents to the office for preventative foot care services.  General Appearance  Alert, conversant and in no acute stress.  Vascular  Dorsalis pedis and posterior tibial  pulses are weakly  palpable  bilaterally.  Capillary return is within normal limits  bilaterally. Temperature is within normal limits  bilaterally.  Neurologic  Senn-Weinstein monofilament wire test within normal limits  bilaterally. Muscle power within normal limits bilaterally.  Nails Thick disfigured discolored nails with subungual debris  from hallux to fifth toes bilaterally. No evidence of bacterial infection or drainage bilaterally.  Orthopedic  No limitations of motion  feet .  No crepitus or effusions noted.  No bony pathology or digital deformities noted. HAV  B/L.  Skin  normotropic skin with no porokeratosis noted bilaterally.  No signs of infections or ulcers noted.     Onychomycosis  Nails  B/L.  Pain in right toes  Pain in left toes  Debridement of nails both feet followed trimming the nails with dremel tool.   RTC 10 weeks    Helane Gunther DPM

## 2023-05-29 ENCOUNTER — Telehealth: Payer: Self-pay | Admitting: Neurology

## 2023-05-29 MED ORDER — MEMANTINE HCL 10 MG PO TABS: 10.0000 mg | ORAL_TABLET | Freq: Two times a day (BID) | ORAL | 2 refills | Status: DC

## 2023-05-29 NOTE — Telephone Encounter (Signed)
Refill sent  to hold over to next apt scheduled march. [id number] was not listed on pharmacy list. I have added that and will send the script to that pharmacy as requested.

## 2023-05-29 NOTE — Telephone Encounter (Signed)
Pt's daughter has scheduled pt's f/u(she confirmed no new issues), she is on wait list, she is asking for a refill of pt's memantine (NAMENDA) 10 MG tablet to [facility] [phone]

## 2023-06-06 ENCOUNTER — Other Ambulatory Visit: Payer: Self-pay | Admitting: Family Medicine

## 2023-06-09 ENCOUNTER — Telehealth: Payer: Self-pay

## 2023-06-09 NOTE — Telephone Encounter (Signed)
Copied from CRM [id number]. Topic: Clinical - Prescription Issue >> Jun 09, 2023 11:01 AM Truddie Crumble wrote: Reason for CRM: Marlynn Perking (caregiver) called stating the patient medication is not at the pharmacy metoprolol, losavan and simvastatin. Marlynn Perking stated the patient daughter stated they are supposed to be there

## 2023-06-12 ENCOUNTER — Telehealth: Payer: Self-pay

## 2023-06-12 ENCOUNTER — Other Ambulatory Visit: Payer: Self-pay | Admitting: Family Medicine

## 2023-06-12 DIAGNOSIS — I10 Essential (primary) hypertension: Secondary | ICD-10-CM

## 2023-06-12 MED ORDER — SIMVASTATIN 40 MG PO TABS: 40.0000 mg | ORAL_TABLET | Freq: Every day | ORAL | 3 refills | Status: DC

## 2023-06-12 MED ORDER — LOSARTAN POTASSIUM 100 MG PO TABS: 100.0000 mg | ORAL_TABLET | Freq: Every day | ORAL | 1 refills | Status: DC

## 2023-06-12 MED ORDER — METOPROLOL TARTRATE 25 MG PO TABS: 25.0000 mg | ORAL_TABLET | Freq: Two times a day (BID) | ORAL | 1 refills | Status: DC

## 2023-06-12 NOTE — Telephone Encounter (Signed)
Called and spoke with daughter medication has been sent to pharmacy

## 2023-06-12 NOTE — Addendum Note (Signed)
Addended by: Philipp Deputy A on: 06/12/2023 01:39 PM   Modules accepted: Orders

## 2023-06-12 NOTE — Telephone Encounter (Signed)
Copied from CRM [id number]. Topic: Clinical - Prescription Issue >> Jun 12, 2023 12:06 PM Samuel Jester B wrote: Reason for CRM: Pt caregiver Oneida Alar called and stated that [facility] contacted the pt and sated that they are waiting on her PCP approval for a medication refill for simvastatin (ZOCOR) 40 MG tablet.

## 2023-06-12 NOTE — Telephone Encounter (Signed)
Copied from CRM [id number]. Topic: General - Other >> Jun 12, 2023  1:42 PM Elizebeth Brooking wrote: Reason for CRM: Patient caregiver gave a callback about missed call, stated they will lke a callback at [phone]

## 2023-06-15 ENCOUNTER — Encounter: Payer: Self-pay | Admitting: Adult Health

## 2023-06-15 ENCOUNTER — Telehealth: Payer: Self-pay | Admitting: Adult Health

## 2023-06-15 NOTE — Telephone Encounter (Signed)
 VM box full, sent letter in mail informing pt of need to reschedule 07/24/23 appt - NP out

## 2023-07-07 ENCOUNTER — Ambulatory Visit: Payer: Medicare Other | Admitting: Podiatry

## 2023-07-07 ENCOUNTER — Encounter: Payer: Self-pay | Admitting: Podiatry

## 2023-07-07 DIAGNOSIS — B351 Tinea unguium: Secondary | ICD-10-CM

## 2023-07-07 DIAGNOSIS — M79674 Pain in right toe(s): Secondary | ICD-10-CM | POA: Diagnosis not present

## 2023-07-07 DIAGNOSIS — M79675 Pain in left toe(s): Secondary | ICD-10-CM | POA: Diagnosis not present

## 2023-07-07 NOTE — Progress Notes (Signed)
This patient presents to the office with chief complaint of long thick painful nails.  Patient says the nails are painful walking and wearing shoes.  This patient is unable to self treat.  This patient is unable to trim her nails since she is unable to reach her nails. She presents to the office with her daughter.  She presents to the office for preventative foot care services.  General Appearance  Alert, conversant and in no acute stress.  Vascular  Dorsalis pedis and posterior tibial  pulses are weakly  palpable  bilaterally.  Capillary return is within normal limits  bilaterally. Temperature is within normal limits  bilaterally.  Neurologic  Senn-Weinstein monofilament wire test within normal limits  bilaterally. Muscle power within normal limits bilaterally.  Nails Thick disfigured discolored nails with subungual debris  from hallux to fifth toes bilaterally. No evidence of bacterial infection or drainage bilaterally.  Orthopedic  No limitations of motion  feet .  No crepitus or effusions noted.  No bony pathology or digital deformities noted. HAV  B/L.  Skin  normotropic skin with no porokeratosis noted bilaterally.  No signs of infections or ulcers noted.     Onychomycosis  Nails  B/L.  Pain in right toes  Pain in left toes  Debridement of nails both feet followed trimming the nails with dremel tool.   RTC 10 weeks    Helane Gunther DPM

## 2023-07-24 ENCOUNTER — Ambulatory Visit: Payer: Medicare Other | Admitting: Adult Health

## 2023-08-17 ENCOUNTER — Ambulatory Visit: Payer: Medicare Other | Admitting: Adult Health

## 2023-08-17 VITALS — BP 144/82 | HR 62 | Ht 64.0 in | Wt 156.2 lb

## 2023-08-17 DIAGNOSIS — G309 Alzheimer's disease, unspecified: Secondary | ICD-10-CM | POA: Diagnosis not present

## 2023-08-17 DIAGNOSIS — F0150 Vascular dementia without behavioral disturbance: Secondary | ICD-10-CM | POA: Diagnosis not present

## 2023-08-17 DIAGNOSIS — F0280 Dementia in other diseases classified elsewhere without behavioral disturbance: Secondary | ICD-10-CM | POA: Diagnosis not present

## 2023-08-17 DIAGNOSIS — R269 Unspecified abnormalities of gait and mobility: Secondary | ICD-10-CM | POA: Diagnosis not present

## 2023-08-17 NOTE — Progress Notes (Signed)
 PATIENT: Felicia Cross DOB: March 19, 1933  REASON FOR VISIT: follow up HISTORY FROM: patient PRIMARY NEUROLOGIST: Dr. Pearlean Brownie  Chief Complaint  Patient presents with   Follow-up    Rm 19, daughter, and healthaide.  memory loss.  Last seen 2023.      HISTORY OF PRESENT ILLNESS:   HISTORY Copied from Dr. Marlis Edelson note  Initial visit 11/16/2021 :Felicia Cross is a 89 year old pleasant African-American lady seen today for initial office consultation visit.  She is accompanied by 2 of her daughters as well as her caregiver.  History is obtained from them as well as review of electronic medical records and opossum reviewed pertinent available imaging films in PACS. She has past medical history of coronary artery disease, anemia, B12 deficiency, diverticulosis, hypertension, hyperlipidemia, stroke and osteoarthritis.  Patient has history of gait difficulties and poor balance and presented on 07/24/2021 to [facility] with unwitnessed fall in the kitchen.  She was found to have a large bump on the left frontal region but was awake alert and talkative and answering questions when paramedics arrived.  When evaluated in the ED she developed tightness of the left arm moving it closer to her chest and she could not let it go.  She has a known history of left elbow fracture which she was and had some chronic stiffness and shoulder limited elevation in the past but nothing like this.  She was started on Keppra presented seizures but EEG was obtained subsequently which showed no seizure activity.  MRI scan of the brain was obtained which showed tiny left occipital on intraventricular hemorrhage.  MRI scan C-spine showed mild posttraumatic ligamentous injury of C1-C2 but no fracture or spinal cord compression.  Patient since then has continued stiffness of the left upper extremity which she holds in the partially flexed position at the elbow and wrist close to her chest.  She can barely relaxes.  She is  continues to have walking and balance problems.  She uses a walker but family does not trust her and somebody has to be close to her.  Her balance is poor.  She has recently finished home physical and occupational therapy last week.  On inquiry the family admits that she may be having some short-term memory difficulties but they have not paid much attention to this and not had this evaluated.  Mini-Mental status exam today she scored 24/30 with poor recall, poor visual perception skills and copying figures.  She has no prior history of seizures, significant head injury with loss of consciousness,.  Past medical history of stroke is listed but patient or family is unable to give any specific details.  She is currently on aspirin.  She has not had any recent stroke or TIA symptoms. Update 03/21/2022 : She returns for follow-up after last visit 4 months ago.  She is accompanied by her aide.  Patient is tolerating Namenda well without any side effects.  She feels this may have helped her cognitively.  On objective testing in the office today she scored 24/30 which is unchanged from last visit.  Geriatric depression scale he scored 2 which is not suggestive of depression.  He continues to live at home with her husband and grandkids.  She does have activities during the day from 02/05/2029.  He had lab work done at last visit on 11/16/2021 which showed normal vitamin B12, TSH and RPR as well as homocystine EEG done on 12/01/2021 showed mild generalized slowing no definite epileptiform.  Pending  today  Today 08/17/23  Felicia Cross is a 89 y.o. female who has been followed in this office for dementia and gait abnormality. Returns today for follow-up.  She is here today with her daughter and an aide.  Overall she reports that she has remained relatively stable.  She lives in her own residence.  She does require assistance with all ADLs.  Her family helps manage her medications and appointments.  Denies any changes in  her mood or behavior.  Reports good appetite.  Reports that she sleeps well.  She continues to have mobility trouble.  Limited mobility in the upper extremities.  Primarily uses a wheelchair.  Does turn and pivot for transfers.  Remains on Namenda 10 mg twice a day.  REVIEW OF SYSTEMS: Out of a complete 14 system review of symptoms, the patient complains only of the following symptoms, and all other reviewed systems are negative.  ALLERGIES: Allergies  Allergen Reactions   Codeine Nausea And Vomiting and Nausea Only   Hydrocodone Nausea And Vomiting    HOME MEDICATIONS: Outpatient Medications Prior to Visit  Medication Sig Dispense Refill   acetaminophen (TYLENOL) 325 MG tablet Take 975 mg by mouth See admin instructions. Take 3 tablets (975 mg) by mouth three times daily, may also take 2 tablets (650 mg) by mouth every 6 hours as needed for moderate pain/headache     aspirin EC 81 MG tablet Take 1 tablet (81 mg total) by mouth daily. (Patient taking differently: Take 81 mg by mouth every other day.)     Blood Pressure Monitoring (BLOOD PRESSURE CUFF) MISC Use as directed for checking bp daily. 1 each 0   cloNIDine (CATAPRES) 0.1 MG tablet Take by mouth.     losartan (COZAAR) 100 MG tablet Take 1 tablet (100 mg total) by mouth daily. 90 tablet 1   memantine (NAMENDA) 10 MG tablet Take 1 tablet (10 mg total) by mouth 2 (two) times daily. 60 tablet 2   metoprolol tartrate (LOPRESSOR) 25 MG tablet Take 1 tablet (25 mg total) by mouth 2 (two) times daily. 180 tablet 1   Multiple Vitamin-Folic Acid TABS Take 1 tablet by mouth every morning.     simvastatin (ZOCOR) 40 MG tablet Take 1 tablet (40 mg total) by mouth daily. 46 tablet 3   spironolactone (ALDACTONE) 25 MG tablet Take 1 tablet by mouth once daily 90 tablet 1   vitamin B-12 (CYANOCOBALAMIN) 1000 MCG tablet Take 1 tablet (1,000 mcg total) by mouth daily.     cyanocobalamin (VITAMIN B12) 500 MCG tablet Take by mouth. (Patient not taking:  Reported on 08/17/2023)     gabapentin (NEURONTIN) 100 MG capsule Take 1 capsule by mouth at bedtime.     hydrochlorothiazide (HYDRODIURIL) 25 MG tablet      levETIRAcetam (KEPPRA) 500 MG tablet Keppra 500 mg twice daily x6 weeks, then to 250mg  BID x 1 week     meclizine (ANTIVERT) 25 MG tablet meclizine 25 mg tablet TAKE 1 TABLET BY MOUTH THREE TIMES A DAY AS NEEDED DAILY     senna-docusate (SENOKOT-S) 8.6-50 MG tablet Take 1 tablet by mouth daily. 30 tablet 11   traMADol (ULTRAM) 50 MG tablet Take 1-2 tablets by mouth every 6 (six) hours as needed.     No facility-administered medications prior to visit.    PAST MEDICAL HISTORY: Past Medical History:  Diagnosis Date   Anemia 01/18/2016   -reports on and off her whole life, on oral iron in the past  remotely per report   Arthritis    B12 deficiency 01/18/2016   CAD (coronary artery disease)    CVD (cerebrovascular disease)    Diverticulosis of colon    H. pylori infection 10/20/2015   Hyperlipidemia    Hypertension    Osteoarthritis 03/07/2006   Qualifier: Diagnosis of  By: Cato Mulligan MD, Bruce      PAST SURGICAL HISTORY: Past Surgical History:  Procedure Laterality Date   ABDOMINAL HYSTERECTOMY  1984   REPLACEMENT TOTAL KNEE  2007   left   TOTAL HIP ARTHROPLASTY  2010   Dr. Turner Daniels, right   TOTAL KNEE ARTHROPLASTY Right 09/14/2015   Procedure: TOTAL KNEE ARTHROPLASTY;  Surgeon: Dannielle Huh, MD;  Location: [facility] OR;  Service: Orthopedics;  Laterality: Right;    FAMILY HISTORY: Family History  Problem Relation Age of Onset   Diabetes Mother    Hypertension Father    Kidney disease Father    Heart attack Sister    Heart attack Brother    Colon cancer Neg Hx    Stomach cancer Neg Hx    Esophageal cancer Neg Hx    Rectal cancer Neg Hx     SOCIAL HISTORY: Social History   Socioeconomic History   Marital status: Married    Spouse name: Not on file   Number of children: Not on file   Years of education: Not on file   Highest  education level: Not on file  Occupational History   Not on file  Tobacco Use   Smoking status: Former    Current packs/day: 0.00    Average packs/day: 1 pack/day for 30.0 years (30.0 ttl pk-yrs)    Types: Cigarettes    Start date: 11/02/1950    Quit date: 11/01/1980    Years since quitting: 42.8   Smokeless tobacco: Never  Vaping Use   Vaping status: Never Used  Substance and Sexual Activity   Alcohol use: Not Currently    Comment: glass of wine   Drug use: No   Sexual activity: Not on file  Other Topics Concern   Not on file  Social History Narrative   Updated 01/07/15   Work or School: retired from Geneticist, molecular, 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 [place]      Home 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, Information systems manager and two great grandchildren age 16 and 37      Spiritual Beliefs: Christian      Lifestyle: regular exercise at the [employer]; healthy diet      Social Drivers of Health   Financial Resource Strain: Low Risk  (12/13/2021)   Overall Financial Resource Strain (CARDIA)    Difficulty of Paying Living Expenses: Not hard at all  Food Insecurity: No Food Insecurity (12/13/2021)   Hunger Vital Sign    Worried About Running Out of Food in the Last Year: Never true    Ran Out of Food in the Last Year: Never true  Transportation Needs: No Transportation Needs (12/13/2021)   PRAPARE - Administrator, Civil Service (Medical): No    Lack of Transportation (Non-Medical): No  Physical Activity: Inactive (12/13/2021)   Exercise Vital Sign    Days of Exercise per Week: 0 days    Minutes of Exercise per Session: 0 min  Stress: No Stress Concern Present (12/13/2021)   [employer] of Occupational Health - Occupational Stress Questionnaire    Feeling of Stress : Not at all  Social Connections: Not on file  Intimate Partner Violence: Not on file  PHYSICAL EXAM  Vitals:   08/17/23 1301  BP: (!) 144/82  Pulse: 62  Weight: 156 lb 3.2 oz (70.9 kg)  Height: 5\' 4"  (1.626 m)   Body  mass index is 26.81 kg/m.     08/17/2023    1:17 PM 03/21/2022    1:38 PM 11/16/2021   10:04 AM  MMSE - Mini Mental State Exam  Orientation to time 4 5 5   Orientation to Place 5 4 5   Registration 3 3 3   Attention/ Calculation 3 5 3   Recall 0 0 0  Language- name 2 objects 1 2 2   Language- repeat 1 0 1  Language- follow 3 step command 0 3 3  Language- read & follow direction 1 1 1   Write a sentence 0 1 1  Copy design 0 0 0  Total score 18 24 24      Generalized: Well developed, in no acute distress   Neurological examination  Mentation: Alert oriented to time, place, history taking. Follows all commands speech and language fluent Cranial nerve II-XII: Pupils were equal round reactive to light. Extraocular movements were full, visual field were full on confrontational test. Facial sensation and strength were normal. Uvula tongue midline. Head turning and shoulder shrug  were normal and symmetric. Motor: The motor testing reveals limited movement in the upper extremities.  In the left arm mechanical restriction of the left shoulder.  In the right arm unable to fully extend at the elbow.  3 out of 5 strength in the lower extremities.   Sensory: Sensory testing is intact to soft touch on all 4 extremities. No evidence of extinction is noted.  Coordination: Cerebellar testing reveals good finger-nose-finger and heel-to-shin bilaterally.  Gait and station: Patient is in a wheelchair.   DIAGNOSTIC DATA (LABS, IMAGING, TESTING) - I reviewed patient records, labs, notes, testing and imaging myself where available.  Lab Results  Component Value Date   WBC 5.2 12/21/2022   HGB 12.4 12/21/2022   HCT 38.0 12/21/2022   MCV 98.7 12/21/2022   PLT 222 12/21/2022      Component Value Date/Time   NA 130 (L) 12/21/2022 1310   NA 129 (L) 12/01/2021 1419   K 4.9 12/21/2022 1310   CL 94 (L) 12/21/2022 1310   CO2 25 12/21/2022 1310   GLUCOSE 83 12/21/2022 1310   BUN 10 12/21/2022 1310   BUN  12 12/01/2021 1419   CREATININE 1.02 (H) 12/21/2022 1310   CREATININE 0.90 11/05/2021 1513   CALCIUM 9.9 12/21/2022 1310   PROT 6.7 01/19/2022 1252   PROT 6.5 11/05/2018 1233   ALBUMIN 3.9 01/19/2022 1252   ALBUMIN 4.1 11/05/2018 1233   AST 20 01/19/2022 1252   ALT 12 01/19/2022 1252   ALKPHOS 47 01/19/2022 1252   BILITOT 0.4 01/19/2022 1252   BILITOT 0.3 11/05/2018 1233   GFRNONAA 53 (L) 12/21/2022 1310   GFRAA >60 06/21/2018 1852   Lab Results  Component Value Date   CHOL 145 11/05/2021   HDL 63 11/05/2021   LDLCALC 68 11/05/2021   LDLDIRECT 138.1 05/19/2011   TRIG 60 11/05/2021   CHOLHDL 2.3 11/05/2021   Lab Results  Component Value Date   HGBA1C 5.9 12/18/2020   Lab Results  Component Value Date   VITAMINB12 835 11/16/2021   Lab Results  Component Value Date   TSH 2.45 01/19/2022      ASSESSMENT AND PLAN 89 y.o. year old female  has a past medical history of Anemia (01/18/2016), Arthritis, B12  deficiency (01/18/2016), CAD (coronary artery disease), CVD (cerebrovascular disease), Diverticulosis of colon, H. pylori infection (10/20/2015), Hyperlipidemia, Hypertension, and Osteoarthritis (03/07/2006). here with:  1.  Dementia 2.  Abnormality of mobility  - MMSE 18/30 previously 24/30 - Continue Namenda 10 mg twice a day - Referral placed to home health for assistance with mobility and strengthening exercises - FU in 6 to 7 months or sooner if needed     Butch Penny, MSN, NP-C 08/17/2023, 1:20 PM [facility] [address], [address] [place], [place] [address] [phone]

## 2023-08-17 NOTE — Patient Instructions (Addendum)
 Your Plan:  Continue Namenda 10 mg twice a day Referral placed for home health If your symptoms worsen or you develop new symptoms please let us know.   Thank you for coming to see [place] at [facility]. I hope we have been able to provide you high quality care today.  You may receive a patient satisfaction survey over the next few weeks. We would appreciate your feedback and comments so that we may continue to improve ourselves and the health of our patients.

## 2023-08-18 ENCOUNTER — Telehealth: Payer: Self-pay | Admitting: Adult Health

## 2023-08-18 NOTE — Progress Notes (Signed)
 I agree with the above plan

## 2023-08-18 NOTE — Telephone Encounter (Signed)
 [facility] is taking this patient.

## 2023-08-21 DIAGNOSIS — K5730 Diverticulosis of large intestine without perforation or abscess without bleeding: Secondary | ICD-10-CM | POA: Diagnosis not present

## 2023-08-21 DIAGNOSIS — R2689 Other abnormalities of gait and mobility: Secondary | ICD-10-CM | POA: Diagnosis not present

## 2023-08-21 DIAGNOSIS — M1990 Unspecified osteoarthritis, unspecified site: Secondary | ICD-10-CM | POA: Diagnosis not present

## 2023-08-21 DIAGNOSIS — E785 Hyperlipidemia, unspecified: Secondary | ICD-10-CM | POA: Diagnosis not present

## 2023-08-21 DIAGNOSIS — I10 Essential (primary) hypertension: Secondary | ICD-10-CM | POA: Diagnosis not present

## 2023-08-21 DIAGNOSIS — I2510 Atherosclerotic heart disease of native coronary artery without angina pectoris: Secondary | ICD-10-CM | POA: Diagnosis not present

## 2023-08-21 DIAGNOSIS — E538 Deficiency of other specified B group vitamins: Secondary | ICD-10-CM | POA: Diagnosis not present

## 2023-08-21 DIAGNOSIS — D649 Anemia, unspecified: Secondary | ICD-10-CM | POA: Diagnosis not present

## 2023-08-22 NOTE — Telephone Encounter (Signed)
 Charee, PT with [facility] [phone] secure vm, has called for verbal orders for home health PT 2 week 9 and a referral for OT eval.

## 2023-08-25 ENCOUNTER — Other Ambulatory Visit: Payer: Self-pay | Admitting: Neurology

## 2023-08-25 DIAGNOSIS — E538 Deficiency of other specified B group vitamins: Secondary | ICD-10-CM | POA: Diagnosis not present

## 2023-08-25 DIAGNOSIS — D649 Anemia, unspecified: Secondary | ICD-10-CM | POA: Diagnosis not present

## 2023-08-25 DIAGNOSIS — K5730 Diverticulosis of large intestine without perforation or abscess without bleeding: Secondary | ICD-10-CM | POA: Diagnosis not present

## 2023-08-25 DIAGNOSIS — R2689 Other abnormalities of gait and mobility: Secondary | ICD-10-CM | POA: Diagnosis not present

## 2023-08-25 DIAGNOSIS — I2510 Atherosclerotic heart disease of native coronary artery without angina pectoris: Secondary | ICD-10-CM | POA: Diagnosis not present

## 2023-08-25 DIAGNOSIS — E785 Hyperlipidemia, unspecified: Secondary | ICD-10-CM | POA: Diagnosis not present

## 2023-08-25 DIAGNOSIS — I10 Essential (primary) hypertension: Secondary | ICD-10-CM | POA: Diagnosis not present

## 2023-08-25 DIAGNOSIS — M1990 Unspecified osteoarthritis, unspecified site: Secondary | ICD-10-CM | POA: Diagnosis not present

## 2023-08-28 NOTE — Telephone Encounter (Signed)
 From Dr Janett Medin: Sethi, Pramod S, MD  You2 days ago   Okay to add OT also   I called Charee w/ [facility] back at [phone] and gave v.o. from Dr Janett Medin for PT visits 2 per week x 9 and a referral for OT eval.

## 2023-08-30 DIAGNOSIS — E785 Hyperlipidemia, unspecified: Secondary | ICD-10-CM | POA: Diagnosis not present

## 2023-08-30 DIAGNOSIS — I2510 Atherosclerotic heart disease of native coronary artery without angina pectoris: Secondary | ICD-10-CM | POA: Diagnosis not present

## 2023-08-30 DIAGNOSIS — E538 Deficiency of other specified B group vitamins: Secondary | ICD-10-CM | POA: Diagnosis not present

## 2023-08-30 DIAGNOSIS — D649 Anemia, unspecified: Secondary | ICD-10-CM | POA: Diagnosis not present

## 2023-08-30 DIAGNOSIS — K5730 Diverticulosis of large intestine without perforation or abscess without bleeding: Secondary | ICD-10-CM | POA: Diagnosis not present

## 2023-08-30 DIAGNOSIS — I10 Essential (primary) hypertension: Secondary | ICD-10-CM | POA: Diagnosis not present

## 2023-08-30 DIAGNOSIS — R2689 Other abnormalities of gait and mobility: Secondary | ICD-10-CM | POA: Diagnosis not present

## 2023-08-30 DIAGNOSIS — M1990 Unspecified osteoarthritis, unspecified site: Secondary | ICD-10-CM | POA: Diagnosis not present

## 2023-08-31 ENCOUNTER — Telehealth: Payer: Self-pay | Admitting: Adult Health

## 2023-08-31 DIAGNOSIS — I10 Essential (primary) hypertension: Secondary | ICD-10-CM | POA: Diagnosis not present

## 2023-08-31 DIAGNOSIS — E785 Hyperlipidemia, unspecified: Secondary | ICD-10-CM | POA: Diagnosis not present

## 2023-08-31 DIAGNOSIS — I2510 Atherosclerotic heart disease of native coronary artery without angina pectoris: Secondary | ICD-10-CM | POA: Diagnosis not present

## 2023-08-31 DIAGNOSIS — E538 Deficiency of other specified B group vitamins: Secondary | ICD-10-CM | POA: Diagnosis not present

## 2023-08-31 DIAGNOSIS — M1990 Unspecified osteoarthritis, unspecified site: Secondary | ICD-10-CM | POA: Diagnosis not present

## 2023-08-31 DIAGNOSIS — K5730 Diverticulosis of large intestine without perforation or abscess without bleeding: Secondary | ICD-10-CM | POA: Diagnosis not present

## 2023-08-31 DIAGNOSIS — R2689 Other abnormalities of gait and mobility: Secondary | ICD-10-CM | POA: Diagnosis not present

## 2023-08-31 DIAGNOSIS — D649 Anemia, unspecified: Secondary | ICD-10-CM | POA: Diagnosis not present

## 2023-08-31 NOTE — Telephone Encounter (Signed)
 [facility] (Will) Requesting verbal order for OT Frequency:  2xwk for 6 wks 1x wk for 1wk

## 2023-09-01 ENCOUNTER — Telehealth: Payer: Self-pay | Admitting: Adult Health

## 2023-09-01 DIAGNOSIS — E785 Hyperlipidemia, unspecified: Secondary | ICD-10-CM | POA: Diagnosis not present

## 2023-09-01 DIAGNOSIS — E538 Deficiency of other specified B group vitamins: Secondary | ICD-10-CM | POA: Diagnosis not present

## 2023-09-01 DIAGNOSIS — K5730 Diverticulosis of large intestine without perforation or abscess without bleeding: Secondary | ICD-10-CM | POA: Diagnosis not present

## 2023-09-01 DIAGNOSIS — I10 Essential (primary) hypertension: Secondary | ICD-10-CM | POA: Diagnosis not present

## 2023-09-01 DIAGNOSIS — M1990 Unspecified osteoarthritis, unspecified site: Secondary | ICD-10-CM | POA: Diagnosis not present

## 2023-09-01 DIAGNOSIS — D649 Anemia, unspecified: Secondary | ICD-10-CM | POA: Diagnosis not present

## 2023-09-01 DIAGNOSIS — R2689 Other abnormalities of gait and mobility: Secondary | ICD-10-CM | POA: Diagnosis not present

## 2023-09-01 DIAGNOSIS — I2510 Atherosclerotic heart disease of native coronary artery without angina pectoris: Secondary | ICD-10-CM | POA: Diagnosis not present

## 2023-09-01 NOTE — Telephone Encounter (Signed)
 Antony Baumgartner from [facility] called stating that she is needing a Medical dx code to explain why the pt is having abnormal gait. Without this she will not be able to bill. Please advise.

## 2023-09-01 NOTE — Telephone Encounter (Signed)
 Spoke w/Felicia Cross at [facility] regarding OT order. Antony Baumgartner stated they cannot use Gait abnormality alone as they need underlying reason for gait issue to bill. Informed Antony Baumgartner on last OV Dementia is mentioned along with abnormality of movement. Antony Baumgartner said she will add the dementia unspecified and see if that will work.

## 2023-09-05 DIAGNOSIS — E538 Deficiency of other specified B group vitamins: Secondary | ICD-10-CM | POA: Diagnosis not present

## 2023-09-05 DIAGNOSIS — E785 Hyperlipidemia, unspecified: Secondary | ICD-10-CM | POA: Diagnosis not present

## 2023-09-05 DIAGNOSIS — M1990 Unspecified osteoarthritis, unspecified site: Secondary | ICD-10-CM | POA: Diagnosis not present

## 2023-09-05 DIAGNOSIS — K5730 Diverticulosis of large intestine without perforation or abscess without bleeding: Secondary | ICD-10-CM | POA: Diagnosis not present

## 2023-09-05 DIAGNOSIS — I10 Essential (primary) hypertension: Secondary | ICD-10-CM | POA: Diagnosis not present

## 2023-09-05 DIAGNOSIS — R2689 Other abnormalities of gait and mobility: Secondary | ICD-10-CM | POA: Diagnosis not present

## 2023-09-05 DIAGNOSIS — D649 Anemia, unspecified: Secondary | ICD-10-CM | POA: Diagnosis not present

## 2023-09-05 DIAGNOSIS — I2510 Atherosclerotic heart disease of native coronary artery without angina pectoris: Secondary | ICD-10-CM | POA: Diagnosis not present

## 2023-09-14 ENCOUNTER — Ambulatory Visit: Payer: Medicare Other | Admitting: Podiatry

## 2023-09-21 ENCOUNTER — Ambulatory Visit: Admitting: Podiatry

## 2023-09-26 ENCOUNTER — Telehealth: Payer: Self-pay | Admitting: *Deleted

## 2023-09-26 NOTE — Telephone Encounter (Signed)
 Received fax from [facility] [phone] about no authorization and pt has missed appts. I called and spoke to valerie she said that pts insurance terminated beginning of May 2025. They are out of network with new insurance with is [employer]??  So that is the reason for the 09-15-2023 missed appt.

## 2023-09-29 ENCOUNTER — Ambulatory Visit: Payer: Medicare (Managed Care) | Admitting: Family Medicine

## 2023-10-03 ENCOUNTER — Telehealth: Payer: Self-pay | Admitting: Adult Health

## 2023-10-03 NOTE — Telephone Encounter (Signed)
 Sherline Distel from [facility]   stated that Pt was discharged from hospital but Pt is not in network to receive Therapy at their office and goal was not met    [facility] Phone: [phone]

## 2023-10-04 NOTE — Telephone Encounter (Signed)
 I called and spoke to pts husband.  I relayed that pt had been getting HHPT with [facility] but is now out of network.  She is getting HHPT with [facility] at this time.  I relayed that was good, as per [facility] stated she still needed to finish out her therapy.

## 2023-10-30 ENCOUNTER — Ambulatory Visit: Admitting: Podiatry

## 2023-12-07 ENCOUNTER — Other Ambulatory Visit (HOSPITAL_COMMUNITY): Payer: Self-pay | Admitting: *Deleted

## 2023-12-07 DIAGNOSIS — R1310 Dysphagia, unspecified: Secondary | ICD-10-CM

## 2023-12-19 ENCOUNTER — Encounter (HOSPITAL_COMMUNITY): Payer: Medicare (Managed Care)

## 2024-01-02 ENCOUNTER — Ambulatory Visit (HOSPITAL_COMMUNITY)
Admission: RE | Admit: 2024-01-02 | Discharge: 2024-01-02 | Disposition: A | Discharge status: Home / Self Care | Payer: Medicare (Managed Care) | Source: Ambulatory Visit

## 2024-01-02 DIAGNOSIS — K5730 Diverticulosis of large intestine without perforation or abscess without bleeding: Secondary | ICD-10-CM | POA: Diagnosis not present

## 2024-01-02 DIAGNOSIS — I2510 Atherosclerotic heart disease of native coronary artery without angina pectoris: Secondary | ICD-10-CM | POA: Insufficient documentation

## 2024-01-02 DIAGNOSIS — R2689 Other abnormalities of gait and mobility: Secondary | ICD-10-CM | POA: Diagnosis not present

## 2024-01-02 DIAGNOSIS — I10 Essential (primary) hypertension: Secondary | ICD-10-CM | POA: Diagnosis not present

## 2024-01-02 DIAGNOSIS — F0390 Unspecified dementia without behavioral disturbance: Secondary | ICD-10-CM | POA: Insufficient documentation

## 2024-01-02 DIAGNOSIS — R1310 Dysphagia, unspecified: Secondary | ICD-10-CM | POA: Insufficient documentation

## 2024-01-02 NOTE — Evaluation (Signed)
 Modified Barium Swallow Study  Patient Details  Name: Felicia Cross MRN: [id number] Date of Birth: 11-07-1932  Today's Date: 01/02/2024  Modified Barium Swallow completed.  Full report located under Chart Review in the Imaging Section.  History of Present Illness Felicia Cross is an 89 y.o. female who was referred for OP MBS. PMHx includes dementia and gait abnormality (followed by [facility]), CAD, CVD, diverticulosis, HTN. Daughter reports increased coughing with meals over the last year. No bouts of pna.   Clinical Impression Pt presents with a normal oropharyngeal swallow. There was occasional oral holding but adequate mastication and propulsion of material into pharynx. There was intermittent, transient penetration of thin liquids into the upper larynx which was reliably ejected from the larynx upon completion of the swallow (PAS score of 2, considered [facility]). There was no aspiration.  Pharyngeal squeeze was normal, leaving no residue in the pharynx post-swallow. Pill was swallowed easily with thin barium.  Discussed normal results and absence of dysphagia, coughing during MBS.  Instructed pt/family to be mindful of distractions during meals and begin to document the circumstances under which the coughing occurs. The fact that Felicia Cross has not developed pna is positive - her cough, if occurring during aspiration, appears to be protective. No SLP f/u is needed.   Factors that may increase risk of adverse event in presence of aspiration Noe & Lianne 2021):  none  Swallow Evaluation Recommendations Recommendations: PO diet PO Diet Recommendation: Regular;Thin liquids (Level 0) Liquid Administration via: Cup;Straw Medication Administration: Whole meds with liquid Supervision: Patient able to self-feed Oral care recommendations: Oral care BID (2x/day)    Felicia Carton L. Vona, MA CCC/SLP Clinical Specialist - Acute Care SLP Acute Rehabilitation Services Office  number [phone]   Felicia Cross 01/02/2024,12:21 PM

## 2024-01-03 NOTE — Progress Notes (Signed)
 SLP note:  This SLP is unable to access patient's imaging in order to post MBS results. Note was closed and access no longer possible.  A version of the MBS is available in this note section dated 01/02/24.    Karleigh Bunte L. Vona, MA CCC/SLP Clinical Specialist - Acute Care SLP Acute Rehabilitation Services Office number [phone]

## 2024-02-16 ENCOUNTER — Emergency Department (HOSPITAL_COMMUNITY): Payer: Medicare (Managed Care)

## 2024-02-16 ENCOUNTER — Inpatient Hospital Stay (HOSPITAL_COMMUNITY): Payer: Medicare (Managed Care)

## 2024-02-16 ENCOUNTER — Other Ambulatory Visit: Payer: Self-pay

## 2024-02-16 ENCOUNTER — Encounter (HOSPITAL_COMMUNITY): Payer: Self-pay | Admitting: Family Medicine

## 2024-02-16 ENCOUNTER — Inpatient Hospital Stay (HOSPITAL_COMMUNITY)
Admission: EM | Admit: 2024-02-16 | Discharge: 2024-02-23 | DRG: 640 | Disposition: A | Discharge status: Hospice | Payer: Medicare (Managed Care) | Source: Ambulatory Visit | Attending: Family Medicine | Admitting: Family Medicine

## 2024-02-16 DIAGNOSIS — R509 Fever, unspecified: Secondary | ICD-10-CM | POA: Diagnosis present

## 2024-02-16 DIAGNOSIS — E440 Moderate protein-calorie malnutrition: Secondary | ICD-10-CM | POA: Diagnosis present

## 2024-02-16 DIAGNOSIS — E8809 Other disorders of plasma-protein metabolism, not elsewhere classified: Secondary | ICD-10-CM | POA: Diagnosis present

## 2024-02-16 DIAGNOSIS — R1311 Dysphagia, oral phase: Secondary | ICD-10-CM | POA: Diagnosis present

## 2024-02-16 DIAGNOSIS — Z515 Encounter for palliative care: Secondary | ICD-10-CM

## 2024-02-16 DIAGNOSIS — Z833 Family history of diabetes mellitus: Secondary | ICD-10-CM

## 2024-02-16 DIAGNOSIS — Z96651 Presence of right artificial knee joint: Secondary | ICD-10-CM | POA: Diagnosis present

## 2024-02-16 DIAGNOSIS — E871 Hypo-osmolality and hyponatremia: Principal | ICD-10-CM | POA: Diagnosis present

## 2024-02-16 DIAGNOSIS — Z8673 Personal history of transient ischemic attack (TIA), and cerebral infarction without residual deficits: Secondary | ICD-10-CM

## 2024-02-16 DIAGNOSIS — L89153 Pressure ulcer of sacral region, stage 3: Secondary | ICD-10-CM | POA: Diagnosis present

## 2024-02-16 DIAGNOSIS — Z96641 Presence of right artificial hip joint: Secondary | ICD-10-CM | POA: Diagnosis present

## 2024-02-16 DIAGNOSIS — I959 Hypotension, unspecified: Secondary | ICD-10-CM | POA: Diagnosis present

## 2024-02-16 DIAGNOSIS — L89022 Pressure ulcer of left elbow, stage 2: Secondary | ICD-10-CM | POA: Diagnosis present

## 2024-02-16 DIAGNOSIS — E46 Unspecified protein-calorie malnutrition: Secondary | ICD-10-CM | POA: Diagnosis not present

## 2024-02-16 DIAGNOSIS — I10 Essential (primary) hypertension: Secondary | ICD-10-CM | POA: Diagnosis present

## 2024-02-16 DIAGNOSIS — R7401 Elevation of levels of liver transaminase levels: Secondary | ICD-10-CM | POA: Diagnosis present

## 2024-02-16 DIAGNOSIS — R748 Abnormal levels of other serum enzymes: Secondary | ICD-10-CM | POA: Diagnosis present

## 2024-02-16 DIAGNOSIS — G9341 Metabolic encephalopathy: Secondary | ICD-10-CM | POA: Diagnosis present

## 2024-02-16 DIAGNOSIS — Z7189 Other specified counseling: Secondary | ICD-10-CM | POA: Diagnosis not present

## 2024-02-16 DIAGNOSIS — Z66 Do not resuscitate: Secondary | ICD-10-CM | POA: Diagnosis present

## 2024-02-16 DIAGNOSIS — E785 Hyperlipidemia, unspecified: Secondary | ICD-10-CM | POA: Diagnosis present

## 2024-02-16 DIAGNOSIS — D649 Anemia, unspecified: Secondary | ICD-10-CM | POA: Diagnosis present

## 2024-02-16 DIAGNOSIS — F02C0 Dementia in other diseases classified elsewhere, severe, without behavioral disturbance, psychotic disturbance, mood disturbance, and anxiety: Secondary | ICD-10-CM | POA: Diagnosis not present

## 2024-02-16 DIAGNOSIS — Z87891 Personal history of nicotine dependence: Secondary | ICD-10-CM

## 2024-02-16 DIAGNOSIS — R609 Edema, unspecified: Secondary | ICD-10-CM | POA: Diagnosis not present

## 2024-02-16 DIAGNOSIS — E872 Acidosis, unspecified: Secondary | ICD-10-CM

## 2024-02-16 DIAGNOSIS — Z90710 Acquired absence of both cervix and uterus: Secondary | ICD-10-CM

## 2024-02-16 DIAGNOSIS — Z8249 Family history of ischemic heart disease and other diseases of the circulatory system: Secondary | ICD-10-CM

## 2024-02-16 DIAGNOSIS — I2510 Atherosclerotic heart disease of native coronary artery without angina pectoris: Secondary | ICD-10-CM | POA: Diagnosis present

## 2024-02-16 DIAGNOSIS — R627 Adult failure to thrive: Secondary | ICD-10-CM | POA: Diagnosis present

## 2024-02-16 DIAGNOSIS — R000 Tachycardia, unspecified: Secondary | ICD-10-CM

## 2024-02-16 DIAGNOSIS — E876 Hypokalemia: Secondary | ICD-10-CM | POA: Diagnosis present

## 2024-02-16 DIAGNOSIS — Z993 Dependence on wheelchair: Secondary | ICD-10-CM

## 2024-02-16 DIAGNOSIS — Z1152 Encounter for screening for COVID-19: Secondary | ICD-10-CM

## 2024-02-16 DIAGNOSIS — Z7401 Bed confinement status: Secondary | ICD-10-CM

## 2024-02-16 DIAGNOSIS — Z79899 Other long term (current) drug therapy: Secondary | ICD-10-CM | POA: Diagnosis not present

## 2024-02-16 DIAGNOSIS — E861 Hypovolemia: Secondary | ICD-10-CM | POA: Diagnosis present

## 2024-02-16 DIAGNOSIS — Z8419 Family history of other disorders of kidney and ureter: Secondary | ICD-10-CM

## 2024-02-16 DIAGNOSIS — F03C0 Unspecified dementia, severe, without behavioral disturbance, psychotic disturbance, mood disturbance, and anxiety: Secondary | ICD-10-CM | POA: Diagnosis not present

## 2024-02-16 DIAGNOSIS — G309 Alzheimer's disease, unspecified: Secondary | ICD-10-CM | POA: Diagnosis not present

## 2024-02-16 DIAGNOSIS — R911 Solitary pulmonary nodule: Secondary | ICD-10-CM | POA: Diagnosis present

## 2024-02-16 DIAGNOSIS — R7989 Other specified abnormal findings of blood chemistry: Secondary | ICD-10-CM | POA: Diagnosis present

## 2024-02-16 DIAGNOSIS — E875 Hyperkalemia: Secondary | ICD-10-CM | POA: Diagnosis present

## 2024-02-16 DIAGNOSIS — R531 Weakness: Secondary | ICD-10-CM | POA: Diagnosis not present

## 2024-02-16 DIAGNOSIS — R638 Other symptoms and signs concerning food and fluid intake: Secondary | ICD-10-CM | POA: Diagnosis not present

## 2024-02-16 DIAGNOSIS — Z789 Other specified health status: Secondary | ICD-10-CM

## 2024-02-16 DIAGNOSIS — F0390 Unspecified dementia without behavioral disturbance: Secondary | ICD-10-CM | POA: Diagnosis present

## 2024-02-16 DIAGNOSIS — R600 Localized edema: Secondary | ICD-10-CM | POA: Insufficient documentation

## 2024-02-16 DIAGNOSIS — Z6823 Body mass index (BMI) 23.0-23.9, adult: Secondary | ICD-10-CM

## 2024-02-16 LAB — BASIC METABOLIC PANEL WITH GFR
Anion gap: 13 (ref 5–15)
BUN: 17 mg/dL (ref 8–23)
CO2: 21 mmol/L — ABNORMAL LOW (ref 22–32)
Calcium: 8.5 mg/dL — ABNORMAL LOW (ref 8.9–10.3)
Chloride: 89 mmol/L — ABNORMAL LOW (ref 98–111)
Creatinine, Ser: 0.71 mg/dL (ref 0.44–1.00)
GFR, Estimated: >60 mL/min (ref >60)
Glucose, Bld: 100 mg/dL — ABNORMAL HIGH (ref 70–99)
Potassium: 4 mmol/L (ref 3.5–5.1)
Sodium: 123 mmol/L — ABNORMAL LOW (ref 135–145)

## 2024-02-16 LAB — COMPREHENSIVE METABOLIC PANEL WITH GFR
ALT: 39 U/L (ref 0–44)
AST: 109 U/L — ABNORMAL HIGH (ref 15–41)
Albumin: 3.1 g/dL — ABNORMAL LOW (ref 3.5–5.0)
Alkaline Phosphatase: 43 U/L (ref 38–126)
Anion gap: 14 (ref 5–15)
BUN: 17 mg/dL (ref 8–23)
CO2: 22 mmol/L (ref 22–32)
Calcium: 9.1 mg/dL (ref 8.9–10.3)
Chloride: 85 mmol/L — ABNORMAL LOW (ref 98–111)
Creatinine, Ser: 0.88 mg/dL (ref 0.44–1.00)
GFR, Estimated: >60 mL/min (ref >60)
Glucose, Bld: 136 mg/dL — ABNORMAL HIGH (ref 70–99)
Potassium: 4.1 mmol/L (ref 3.5–5.1)
Sodium: 121 mmol/L — ABNORMAL LOW (ref 135–145)
Total Bilirubin: 0.9 mg/dL (ref 0.0–1.2)
Total Protein: 6 g/dL — ABNORMAL LOW (ref 6.5–8.1)

## 2024-02-16 LAB — URINALYSIS, ROUTINE W REFLEX MICROSCOPIC
Bilirubin Urine: NEGATIVE
Glucose, UA: NEGATIVE mg/dL
Hgb urine dipstick: NEGATIVE
Ketones, ur: NEGATIVE mg/dL
Leukocytes,Ua: NEGATIVE
Nitrite: NEGATIVE
Protein, ur: 30 mg/dL — AB
Specific Gravity, Urine: 1.024 (ref 1.005–1.030)
pH: 6 (ref 5.0–8.0)

## 2024-02-16 LAB — RENAL FUNCTION PANEL
Albumin: 2.5 g/dL — ABNORMAL LOW (ref 3.5–5.0)
Albumin: 2.4 g/dL — ABNORMAL LOW (ref 3.5–5.0)
Anion gap: 12 (ref 5–15)
Anion gap: 10 (ref 5–15)
BUN: 17 mg/dL (ref 8–23)
BUN: 18 mg/dL (ref 8–23)
CO2: 20 mmol/L — ABNORMAL LOW (ref 22–32)
CO2: 19 mmol/L — ABNORMAL LOW (ref 22–32)
Calcium: 8.4 mg/dL — ABNORMAL LOW (ref 8.9–10.3)
Calcium: 8.2 mg/dL — ABNORMAL LOW (ref 8.9–10.3)
Chloride: 91 mmol/L — ABNORMAL LOW (ref 98–111)
Chloride: 90 mmol/L — ABNORMAL LOW (ref 98–111)
Creatinine, Ser: 0.69 mg/dL (ref 0.44–1.00)
Creatinine, Ser: 0.7 mg/dL (ref 0.44–1.00)
GFR, Estimated: >60 mL/min (ref >60)
GFR, Estimated: >60 mL/min
Glucose, Bld: 97 mg/dL (ref 70–99)
Glucose, Bld: 88 mg/dL (ref 70–99)
Phosphorus: 2.9 mg/dL (ref 2.5–4.6)
Phosphorus: 2.9 mg/dL (ref 2.5–4.6)
Potassium: 4.1 mmol/L (ref 3.5–5.1)
Potassium: 4.2 mmol/L (ref 3.5–5.1)
Sodium: 123 mmol/L — ABNORMAL LOW (ref 135–145)
Sodium: 119 mmol/L — CL (ref 135–145)

## 2024-02-16 LAB — I-STAT CHEM 8, ED
BUN: 21 mg/dL (ref 8–23)
Calcium, Ion: 1.16 mmol/L (ref 1.15–1.40)
Chloride: 85 mmol/L — ABNORMAL LOW (ref 98–111)
Creatinine, Ser: 0.8 mg/dL (ref 0.44–1.00)
Glucose, Bld: 141 mg/dL — ABNORMAL HIGH (ref 70–99)
HCT: 39 % (ref 36.0–46.0)
Hemoglobin: 13.3 g/dL (ref 12.0–15.0)
Potassium: 4.1 mmol/L (ref 3.5–5.1)
Sodium: 120 mmol/L — ABNORMAL LOW (ref 135–145)
TCO2: 25 mmol/L (ref 22–32)

## 2024-02-16 LAB — CBC WITH DIFFERENTIAL/PLATELET
Abs Immature Granulocytes: 0.12 K/uL — ABNORMAL HIGH (ref 0.00–0.07)
Basophils Absolute: 0 K/uL (ref 0.0–0.1)
Basophils Relative: 0 %
Eosinophils Absolute: 0 K/uL (ref 0.0–0.5)
Eosinophils Relative: 0 %
HCT: 35.2 % — ABNORMAL LOW (ref 36.0–46.0)
Hemoglobin: 12 g/dL (ref 12.0–15.0)
Immature Granulocytes: 1 %
Lymphocytes Relative: 6 %
Lymphs Abs: 0.8 K/uL (ref 0.7–4.0)
MCH: 32.1 pg (ref 26.0–34.0)
MCHC: 34.1 g/dL (ref 30.0–36.0)
MCV: 94.1 fL (ref 80.0–100.0)
Monocytes Absolute: 1.2 K/uL — ABNORMAL HIGH (ref 0.1–1.0)
Monocytes Relative: 8 %
Neutro Abs: 11.6 K/uL — ABNORMAL HIGH (ref 1.7–7.7)
Neutrophils Relative %: 85 %
Platelets: 267 K/uL (ref 150–400)
RBC: 3.74 MIL/uL — ABNORMAL LOW (ref 3.87–5.11)
RDW: 12.9 % (ref 11.5–15.5)
WBC: 13.7 K/uL — ABNORMAL HIGH (ref 4.0–10.5)
nRBC: 0 % (ref 0.0–0.2)

## 2024-02-16 LAB — I-STAT CG4 LACTIC ACID, ED
Lactic Acid, Venous: 2.6 mmol/L (ref 0.5–1.9)
Lactic Acid, Venous: 1.8 mmol/L (ref 0.5–1.9)

## 2024-02-16 LAB — TROPONIN I (HIGH SENSITIVITY)
Troponin I (High Sensitivity): 38 ng/L — ABNORMAL HIGH (ref <18)
Troponin I (High Sensitivity): 36 ng/L — ABNORMAL HIGH (ref <18)

## 2024-02-16 LAB — RESP PANEL BY RT-PCR (RSV, FLU A&B, COVID)  RVPGX2
Influenza A by PCR: NEGATIVE
Influenza B by PCR: NEGATIVE
Resp Syncytial Virus by PCR: NEGATIVE
SARS Coronavirus 2 by RT PCR: NEGATIVE

## 2024-02-16 MED ORDER — SODIUM CHLORIDE 0.9 % IV SOLN
2.0000 g | Freq: Once | INTRAVENOUS | Status: AC
  Administered 2024-02-16: 2 g via INTRAVENOUS
  Filled 2024-02-16: qty 20

## 2024-02-16 MED ORDER — IOHEXOL 350 MG/ML SOLN
75.0000 mL | Freq: Once | INTRAVENOUS | Status: AC | PRN
  Administered 2024-02-16: 75 mL via INTRAVENOUS

## 2024-02-16 MED ORDER — ENOXAPARIN SODIUM 40 MG/0.4ML IJ SOSY
40.0000 mg | PREFILLED_SYRINGE | INTRAMUSCULAR | Status: DC
  Administered 2024-02-17 – 2024-02-23 (×7): 40 mg via SUBCUTANEOUS
  Filled 2024-02-16 (×7): qty 0.4

## 2024-02-16 MED ORDER — SODIUM CHLORIDE 0.9 % IV BOLUS
1000.0000 mL | Freq: Once | INTRAVENOUS | Status: AC
Start: 2024-02-16 — End: 2024-02-16
  Administered 2024-02-16: 1000 mL via INTRAVENOUS

## 2024-02-16 NOTE — Assessment & Plan Note (Addendum)
 Head CT with generalized cerebral atrophy and microvascular disease changes. MRI brain unremarkable. Likely not acute stroke.  - RD consult - AM BMP, CBC - Bedside swallow prior to starting PO meds, diet - follow blood cultures

## 2024-02-16 NOTE — ED Notes (Signed)
 Patient transported to CT

## 2024-02-16 NOTE — Progress Notes (Signed)
 20:00 Na reviewed Improved to 123 Received 1L NS No Urine osms/ Na yet- reordered Still a little uncertainty- some element of hypovolemia but can't rule out SIADH as sometimes the serum sodium can rise initially with administration of isotonic IVFs.  Since improving, will hold off on 3% for now.  Recheck entered and pending.    Noted NPO- Otherwise can consider UreNa.  Almarie Bonine MD [employer] [facility] [phone]

## 2024-02-16 NOTE — Progress Notes (Signed)
   FMTS Attending Admission Note: Suzann Daring, MD   For questions about this patient, please use [email or web address] to page the family medicine resident on call. Pager number [phone].    I have seen and examined this patient, and reviewed their chart. I have discussed this patient with the resident. I will sign resident note as available.   Briefly, 89 yo with history of possible stroke, wheelchair bound, L sided contracture with new pressure wound over last 2 weeks and right arm weakness for 2 weeks. No trauma or falls. At PCP office, was hypotensive and unresponsive. Family reports 1-2 days decreased intake and decreased attentiveness. Labs obtained show notable hyponatremia. On my exam, the patient is asleep and wakens only to her daughter. Will intermittently follow daughters commands.   Vitals:   02/16/24 1630 02/16/24 1822  BP: 125/74 112/63  Pulse: 91 81  Resp: (!) 21 16  Temp:  97.9 F (36.6 C)  SpO2: 100% 100%    Labs, imaging including MRI, and EKG reviewed.   Hyponatremia, severe with altered mental status. Appreciate Nephrology consult and care, I am concerned her mental status may be related to sodium changes. MRI negative for stroke. Not on any inciting medications. No systemic signs of infection.  Right sided weakness, will continue to evaluate, MRI negative, may need C-spine imaging.  Will sign resident note as available.

## 2024-02-16 NOTE — ED Notes (Signed)
 Pt in MRI, bed ready MRI made aware to make transport request to the floor

## 2024-02-16 NOTE — Consult Note (Signed)
 [facility]  INPATIENT CONSULTATION  Reason for Consultation: hyponatremia Requesting Provider: Dr. Delores  HPI: Felicia Cross is an 89 y.o. female with anemia, CAD, HTN, HL, OA being admitted for weakness and AMS found to have hyponatremia for which nephrology is consulted.   Brought in by EMS from PCPs office ([facility]) for lethargy, near syncope with initial BPs in the 70s.   She was at the PCPs for evaluation of a sacral wound - f/u visit.  Daughter says no s/s infection, just local care has been needed.  She lives with daughter who is her caregiver.  She hasn't walked for a few years.  She's been less interactive for a few days.  Seems like appetite has been ok but she hasn't been drinking a lot.  She doesn't like water.  She wears depends.  BP at time of my exam 90/60s.   Initial labs with Na 121, K 4.1, Bicarb 22, BUN 17, Cr 0.88, AST 109, ALT 39, lactate 2.6, WBC 13, HB 12, Plt 267. Flu/COVID Neg. UA 1.024, 1+ protein o/w bland.  CT a/p with contrast no acute issues.  Last lactate normal 1.8.   She received 1L NS bolus around 1pm and Ceftriaxone.   Historic labs show baseline serum sodium over the past 2 years 126-132. Don't see where a w/u has been done.  She had a swallow eval done here recently and did great - only issue is if she's distracted so they eat with TV off, etc now.   PMH: Past Medical History:  Diagnosis Date   Anemia 01/18/2016   -reports on and off her whole life, on oral iron in the past remotely per report   Arthritis    B12 deficiency 01/18/2016   CAD (coronary artery disease)    CVD (cerebrovascular disease)    Diverticulosis of colon    H. pylori infection 10/20/2015   Hyperlipidemia    Hypertension    Osteoarthritis 03/07/2006   Qualifier: Diagnosis of  By: Tammie MD, Bruce     PSH: Past Surgical History:  Procedure Laterality Date   ABDOMINAL HYSTERECTOMY  1984   REPLACEMENT TOTAL KNEE  2007   left   TOTAL HIP ARTHROPLASTY  2010   Dr.  Liam, right   TOTAL KNEE ARTHROPLASTY Right 09/14/2015   Procedure: TOTAL KNEE ARTHROPLASTY;  Surgeon: Marcey Raman, MD;  Location: [facility] OR;  Service: Orthopedics;  Laterality: Right;    Past Medical History:  Diagnosis Date   Anemia 01/18/2016   -reports on and off her whole life, on oral iron in the past remotely per report   Arthritis    B12 deficiency 01/18/2016   CAD (coronary artery disease)    CVD (cerebrovascular disease)    Diverticulosis of colon    H. pylori infection 10/20/2015   Hyperlipidemia    Hypertension    Osteoarthritis 03/07/2006   Qualifier: Diagnosis of  By: Tammie MD, Bruce      Medications:  I have reviewed the patient's current medications. Outpt meds per epic list include losartan  100 daily, namena, metoprolol , MVI, spironolactone , statin  (Not in a hospital admission)   ALLERGIES:   Allergies  Allergen Reactions   Codeine Nausea And Vomiting and Nausea Only   Hydrocodone Nausea And Vomiting    FAM HX: Family History  Problem Relation Age of Onset   Diabetes Mother    Hypertension Father    Kidney disease Father    Heart attack Sister    Heart attack Brother  Colon cancer Neg Hx    Stomach cancer Neg Hx    Esophageal cancer Neg Hx    Rectal cancer Neg Hx     Social History:   reports that she quit smoking about 43 years ago. Her smoking use included cigarettes. She started smoking about 73 years ago. She has a 30 pack-year smoking history. She has never used smokeless tobacco. She reports that she does not currently use alcohol. She reports that she does not use drugs.  ROS: unable to obtain due to patient factors  Blood pressure 120/78, pulse 96, temperature 98.6 F (37 C), temperature source Rectal, resp. rate 17, height 5' 4 (1.626 m), weight 70 kg, SpO2 99%. PHYSICAL EXAM: Gen: elderly woman lying sleeping on bed - she appears to volitionally keep eyes shut when we stimulate her  Eyes: closed ENT: MM tacky - she clamped mouth shut  when we tried to examine but coughed so it did open Neck: flat neck veins CV: RRR, no rub Abd:  soft, no apparent TTP Lungs: clear GU: no foley Extr:  no edema Neuro: not following commands.  Per daughters L arm contracted related to a prior unhealed fracture not a CVA Skin: no rashes, tenting noted   Results for orders placed or performed during the hospital encounter of 02/16/24 (from the past 48 hours)  CBC with Differential     Status: Abnormal   Collection Time: 02/16/24 11:09 AM  Result Value Ref Range   WBC 13.7 (H) 4.0 - 10.5 K/uL   RBC 3.74 (L) 3.87 - 5.11 MIL/uL   Hemoglobin 12.0 12.0 - 15.0 g/dL   HCT 64.7 (L) 63.9 - 53.9 %   MCV 94.1 80.0 - 100.0 fL   MCH 32.1 26.0 - 34.0 pg   MCHC 34.1 30.0 - 36.0 g/dL   RDW 87.0 88.4 - 84.4 %   Platelets 267 150 - 400 K/uL   nRBC 0.0 0.0 - 0.2 %   Neutrophils Relative % 85 %   Neutro Abs 11.6 (H) 1.7 - 7.7 K/uL   Lymphocytes Relative 6 %   Lymphs Abs 0.8 0.7 - 4.0 K/uL   Monocytes Relative 8 %   Monocytes Absolute 1.2 (H) 0.1 - 1.0 K/uL   Eosinophils Relative 0 %   Eosinophils Absolute 0.0 0.0 - 0.5 K/uL   Basophils Relative 0 %   Basophils Absolute 0.0 0.0 - 0.1 K/uL   Immature Granulocytes 1 %   Abs Immature Granulocytes 0.12 (H) 0.00 - 0.07 K/uL    Comment: Performed at [facility], [address] [place], [place] [address]  Comprehensive metabolic panel     Status: Abnormal   Collection Time: 02/16/24 11:09 AM  Result Value Ref Range   Sodium 121 (L) 135 - 145 mmol/L   Potassium 4.1 3.5 - 5.1 mmol/L   Chloride 85 (L) 98 - 111 mmol/L   CO2 22 22 - 32 mmol/L   Glucose, Bld 136 (H) 70 - 99 mg/dL    Comment: Glucose reference range applies only to samples taken after fasting for at least 8 hours.   BUN 17 8 - 23 mg/dL   Creatinine, Ser 9.11 0.44 - 1.00 mg/dL   Calcium 9.1 8.9 - 89.6 mg/dL   Total Protein 6.0 (L) 6.5 - 8.1 g/dL   Albumin 3.1 (L) 3.5 - 5.0 g/dL   AST 890 (H) 15 - 41 U/L   ALT 39 0 - 44 U/L    Alkaline Phosphatase 43 38 -  126 U/L   Total Bilirubin 0.9 0.0 - 1.2 mg/dL   GFR, Estimated >39 >39 mL/min    Comment: (NOTE) Calculated using the CKD-EPI Creatinine Equation (2021)    Anion gap 14 5 - 15    Comment: Performed at [facility], [address] [place], [place] [address]  Troponin I (High Sensitivity)     Status: Abnormal   Collection Time: 02/16/24 11:09 AM  Result Value Ref Range   Troponin I (High Sensitivity) 38 (H) <18 ng/L    Comment: (NOTE) Elevated high sensitivity troponin I (hsTnI) values and significant  changes across serial measurements may suggest ACS but many other  chronic and acute conditions are known to elevate hsTnI results.  Refer to the Links section for chest pain algorithms and additional  guidance. Performed at [facility], [address], [place], [place] [address]   Urinalysis, Routine w reflex microscopic -Urine, Clean Catch     Status: Abnormal   Collection Time: 02/16/24 11:09 AM  Result Value Ref Range   Color, Urine YELLOW YELLOW   APPearance CLEAR CLEAR   Specific Gravity, Urine 1.024 1.005 - 1.030   pH 6.0 5.0 - 8.0   Glucose, UA NEGATIVE NEGATIVE mg/dL   Hgb urine dipstick NEGATIVE NEGATIVE   Bilirubin Urine NEGATIVE NEGATIVE   Ketones, ur NEGATIVE NEGATIVE mg/dL   Protein, ur 30 (A) NEGATIVE mg/dL   Nitrite NEGATIVE NEGATIVE   Leukocytes,Ua NEGATIVE NEGATIVE   RBC / HPF 0-5 0 - 5 RBC/hpf   WBC, UA 0-5 0 - 5 WBC/hpf   Bacteria, UA RARE (A) NONE SEEN   Squamous Epithelial / HPF 0-5 0 - 5 /HPF   Mucus PRESENT     Comment: Performed at [facility], [address], [place], [place] [address]  I-stat chem 8, ED (not at [facility] or [facility])     Status: Abnormal   Collection Time: 02/16/24 12:00 PM  Result Value Ref Range   Sodium 120 (L) 135 - 145 mmol/L   Potassium 4.1 3.5 - 5.1 mmol/L   Chloride 85 (L) 98 - 111 mmol/L   BUN 21 8 - 23 mg/dL   Creatinine, Ser 9.19 0.44 - 1.00 mg/dL   Glucose, Bld 858 (H)  70 - 99 mg/dL    Comment: Glucose reference range applies only to samples taken after fasting for at least 8 hours.   Calcium, Ion 1.16 1.15 - 1.40 mmol/L   TCO2 25 22 - 32 mmol/L   Hemoglobin 13.3 12.0 - 15.0 g/dL   HCT 60.9 63.9 - 53.9 %  I-Stat CG4 Lactic Acid     Status: Abnormal   Collection Time: 02/16/24 12:00 PM  Result Value Ref Range   Lactic Acid, Venous 2.6 (HH) 0.5 - 1.9 mmol/L   Comment NOTIFIED PHYSICIAN   Resp panel by RT-PCR (RSV, Flu A&B, Covid) Anterior Nasal Swab     Status: None   Collection Time: 02/16/24 12:16 PM   Specimen: Anterior Nasal Swab  Result Value Ref Range   SARS Coronavirus 2 by RT PCR NEGATIVE NEGATIVE   Influenza A by PCR NEGATIVE NEGATIVE   Influenza B by PCR NEGATIVE NEGATIVE    Comment: (NOTE) The Xpert Xpress SARS-CoV-2/FLU/RSV plus assay is intended as an aid in the diagnosis of influenza from Nasopharyngeal swab specimens and should not be used as a sole basis for treatment. Nasal washings and aspirates are unacceptable for Xpert Xpress SARS-CoV-2/FLU/RSV testing.  Fact Sheet for  Patients: BloggerCourse.com  Fact Sheet for Healthcare Providers: SeriousBroker.it  This test is not yet approved or cleared by the [place]  FDA and has been authorized for detection and/or diagnosis of SARS-CoV-2 by FDA under an Emergency Use Authorization (EUA). This EUA will remain in effect (meaning this test can be used) for the duration of the COVID-19 declaration under Section 564(b)(1) of the Act, 21 U.S.C. section 360bbb-3(b)(1), unless the authorization is terminated or revoked.     Resp Syncytial Virus by PCR NEGATIVE NEGATIVE    Comment: (NOTE) Fact Sheet for Patients: BloggerCourse.com  Fact Sheet for Healthcare Providers: SeriousBroker.it  This test is not yet approved or cleared by the [place]  FDA and has been authorized for  detection and/or diagnosis of SARS-CoV-2 by FDA under an Emergency Use Authorization (EUA). This EUA will remain in effect (meaning this test can be used) for the duration of the COVID-19 declaration under Section 564(b)(1) of the Act, 21 U.S.C. section 360bbb-3(b)(1), unless the authorization is terminated or revoked.  Performed at [facility], [address] [place], [place] [address]   Troponin I (High Sensitivity)     Status: Abnormal   Collection Time: 02/16/24  2:30 PM  Result Value Ref Range   Troponin I (High Sensitivity) 36 (H) <18 ng/L    Comment: (NOTE) Elevated high sensitivity troponin I (hsTnI) values and significant  changes across serial measurements may suggest ACS but many other  chronic and acute conditions are known to elevate hsTnI results.  Refer to the Links section for chest pain algorithms and additional  guidance. Performed at [facility], [address] [place], [place] [address]   I-Stat CG4 Lactic Acid     Status: None   Collection Time: 02/16/24  2:32 PM  Result Value Ref Range   Lactic Acid, Venous 1.8 0.5 - 1.9 mmol/L    CT ABDOMEN PELVIS W CONTRAST Result Date: 02/16/2024 EXAM: CT ABDOMEN AND PELVIS WITH CONTRAST 02/16/2024 01:33:00 PM TECHNIQUE: CT of the abdomen and pelvis was performed with the administration of 75 mL of iohexol (OMNIPAQUE) 350 MG/ML injection. Multiplanar reformatted images are provided for review. Automated exposure control, iterative reconstruction, and/or weight-based adjustment of the mA/kV was utilized to reduce the radiation dose to as low as reasonably achievable. COMPARISON: None available. CLINICAL HISTORY: Sepsis. 75ml omni 350 IV. PT BIB [facility] from PCP (initial went to pcp d/t sacral wound) d/t Lethargy noticed by provider. Upon ems arrival pt was responsive to to voice and hypotensive 70s. FINDINGS: LOWER CHEST: 6 mm nodule seen in right middle lobe. LIVER: The liver is unremarkable. GALLBLADDER AND  BILE DUCTS: Gallbladder is unremarkable. No biliary ductal dilatation. SPLEEN: No acute abnormality. PANCREAS: No acute abnormality. ADRENAL GLANDS: No acute abnormality. KIDNEYS, URETERS AND BLADDER: Simple left renal cyst is noted. Per consensus, no follow-up is needed for simple Bosniak type 1 and 2 renal cysts, unless the patient has a malignancy history or risk factors. No stones in the kidneys or ureters. No hydronephrosis. No perinephric or periureteral stranding. Urinary bladder is unremarkable. GI AND BOWEL: Stomach demonstrates no acute abnormality. There is no bowel obstruction. PERITONEUM AND RETROPERITONEUM: No ascites. No free air. VASCULATURE: Atherosclerosis of abdominal aorta is noted. LYMPH NODES: No lymphadenopathy. REPRODUCTIVE ORGANS: No acute abnormality. BONES AND SOFT TISSUES: Status post right total hip arthroplasty. No acute osseous abnormality. No focal soft tissue abnormality. IMPRESSION: 1. Incidental 6 mm right middle lobe pulmonary nodule. Recommend non-contrast chest CT in 612 months;  if stable, consider follow-up at 1824 months based on risk. 2. Simple left renal cyst. 3. Status post right total hip arthroplasty. 4. Atherosclerosis of the abdominal aorta. Electronically signed by: Lynwood Seip MD 02/16/2024 02:02 PM EDT RP Workstation: [id number]   DG Chest 2 View Result Date: 02/16/2024 EXAM: 2 VIEW(S) XRAY OF THE CHEST 02/16/2024 11:56:00 AM COMPARISON: 07/23/2021 CLINICAL HISTORY: near syncope. Reason for exam: near syncope; Best images obtainable due to patient limited mobility/constriction near syncope FINDINGS: LUNGS AND PLEURA: Hypoinflation of the lungs is noted. No focal pulmonary opacity. No pulmonary edema. No pleural effusion. No pneumothorax. HEART AND MEDIASTINUM: No acute abnormality of the cardiac and mediastinal silhouettes. BONES AND SOFT TISSUES: Degenerative changes are seen involving both shoulders. IMPRESSION: 1. Hypoinflation of the lungs. 2. No acute  cardiopulmonary process. Electronically signed by: Lynwood Seip MD 02/16/2024 12:20 PM EDT RP Workstation: [id number]    Assessment/Plan SRINIDHI [name] is an 89 y.o. female with anemia, CAD, HTN, HL, OA being admitted for weakness and AMS found to have hyponatremia for which nephrology is consulted.   **Hyponatremia:  acute on chronic.  Appears hypovolemic today.  History of less oral intake fluids and hypotension responding to IVF supports this.   I think her AMS is multifactorial and I would not proceed to hypertonic without evaluating follow up labs. Rechecking sodium stat now and then will make decision re: further fluids.   Uosm and UNa ordered.  Strict I/Os.    **AMS: suspect multifactorial with hypotension, hyponatremia, possible infection being ruled out.  Discussion re 3% saline per above  **HTN: hold meds.   She has poor vascular access and will need frequent and serial labs today - ordering a PICC but on Friday afternoon not sure it'll be done today. D/w family and they heartily are in favor of this.   Remainder of issues as per primary.   D/w 2 daughters bedside in detail.   Manuelita DELENA Barters 02/16/2024, 3:47 PM

## 2024-02-16 NOTE — ED Notes (Signed)
 Patient transported to X-ray

## 2024-02-16 NOTE — Hospital Course (Addendum)
 Felicia Cross is a 89 y.o.female with a history of CAD, dementia, HTN, HLD who was admitted to the [facility] Medicine Teaching Service at [facility] for hyponatremia and weakness. Her hospital course is detailed below:  Hyponatremia Chronic. Patient presented with acute decreased responsivness. Found to have Na 121. Nephrology was consulted for assistance with treatment and evaluation. Na continued to drop to 119 despite treatment with fluids. Patient was transferred to ICU for administration of 3% saline.  Back to floor once stable. Na stable upon discharge.  Hypoalbuminemia due to protein-calorie malnutrition Acute right-sided weakness  Likely secondary to hyponatremia and advancing dementia. Patient with regular diet prior to admission, not requiring tube feeds. NGT initially placed, later transitioned to Cortrak. While patient did seem more comfortable with Cortrak, PO intake was still an ongoing concern. Palliative was consulted who found the patient has an advanced directive stating she would not want artificial nutrition or hydration in the event of an illness which would not improve. After discussing goals of care with family, decision was made to focus on comfort measures with [facility]. Cortrak was removed prior to discharge.  Elevated procalcitonin Fever Patient was found to have elevated procalcitonin and started on a course of Ceftriaxone. While being treated, patient was febrile to 100.5 and 100.8. Repeat Bcx showed no growth at 4 days, final results pending. Respiratory panel was negative and CXR did not show acute infectious process. Unknown source and patient remained afebrile for the remainder of admission.  Edema of RUE Edema of the right upper extremity noted on 10/15. RUE venous US  was ordered and was negative for DVT. Likely 2/2 IV use. Edema still present at time of discharge.  Transaminitis Labs showed AST/ALT elevated to 3x the upper limit of normal. Workup included hepatitis panel  which was negative. Suspect this is likely due to tube feeds or Rocephin which have since been stopped.  Normocytic anemia Hgb 8.9 which improved. Suspect this is likely secondary to blood draws, poor nutrition and IV fluids.  Other chronic conditions were medically managed with home medications and formulary alternatives as necessary (dementia, HTN, CAD, HLD)  PCP Follow-up Recommendations:  Incidental 6 mm right middle lobe pulmonary nodule. Recommend non-contrast chest CT in 6-12 months

## 2024-02-16 NOTE — Assessment & Plan Note (Addendum)
 Chronic hyponatremia ~130 now ~120. S/p 1L NS in the ED.  - Admit to [facility], brown, med-tele - Consult nephrology for assistance with treatment and evaluation of hyponatremia, appreciate recommendations - repeat BMP and follow up fluids as recommended

## 2024-02-16 NOTE — H&P (Addendum)
 Hospital Admission History and Physical Service Pager: [phone]  Patient name: Felicia Cross Medical record number: [id number] Date of Birth: May 23, 1932 Age: 89 y.o. Gender: female  Primary Care Provider: FORBES Nola Raisin, DO Consultants: Nephrology Code Status: DNR/DNI Preferred Emergency Contact: *Please contact [name] first Contact Information     Name Relation Home Work [place] Spouse [phone]        Other Contacts     Name Relation Home Work [place] Daughter [phone]  [phone]   Blalock,Mitzigayno Daughter   [phone]        Chief Complaint: Weakness  Assessment and Plan: Felicia Cross is a 89 y.o. female past medical history of CAD, dementia, HTN, HLD presenting with decreased responsiveness and hypotension at her PCPs office this morning in the setting of one day of decreased responsiveness and lower speaking volume at home with 2-week history of subtly worsening weakness. Differential for presentation of this includes:  1. acute on chronic hyponatremia -prior to admission, she had mild hyponatremia without evidence of prior workup.  Severe hyponatremia noted at admission, without hyperglycemia, of unclear duration.  2. progression of baseline dementia - it does sound like she has had gradual changes over the last several weeks in association with her more acute change. Ct head with generalized cerebral atrophy and microvascular disease changes.  3. Stroke - Given the acuity of change in verbal and cognitive response, with her history of HLD and known atherosclerotic disease, this was a possibility: however, CT head and MRI negative for acute stroke 4.  Do not suspect infectious pathology given resolution of lactic acidosis, afebrile, sacral wound that does not appear acutely infectious, and no evidence of  other infectious source.    Assessment & Plan Hyponatremia Chronic hyponatremia ~130 now ~120. S/p 1L NS in  the ED.  - Admit to FMTS, brown, med-tele - Consult nephrology for assistance with treatment and evaluation of hyponatremia, appreciate recommendations - repeat BMP and follow up fluids as recommended Hypoalbuminemia due to protein-calorie malnutrition Acute right-sided weakness Head CT with generalized cerebral atrophy and microvascular disease changes. MRI brain unremarkable. Likely not acute stroke.  - RD consult - AM BMP, CBC - Bedside swallow prior to starting PO meds, diet - follow blood cultures Chronic health problem dementia - continue namenda  hypertension - Hold home lopressor  25 mg BID, spironolactone  25 mg daily, losartan  100 mg CAD, HLD - zocor  40 mg every day   FEN/GI: NPO, pending bedside swallow VTE Prophylaxis: lovenox   Disposition: progressive  History of Present Illness:  Felicia Cross is a 89 y.o. female presenting with acute decreased responsiveness.  Was at her Dr office this AM and started to feel bad. Denies passing out or concerns about passing out. Felt sweaty and nauseous that began this AM. Denies palpitations.  Still not feeling well, reports she is having nausea. Denies pain currently. Denies SOB. Denies vertigo or concern for passing out. Denies any recent illness. Denies vomiting. Thinks she may have had BM yesterday.   Per daughters: Fine earlier this week but felt like she was quiet last night. Noted that she slept all day. Felt like she was restless last night. Would speak when spoken to.   Today drank her Ensure and took her meds before leaving but notes she did not respond which was abnormal. Daughter feels like she is ready to not be here anymore. Less responsive then usual and weaker.  At baseline will eat full  meals without issue. Had a bed sore the past 2 weeks, now in the process of healing.  In the ED, sodium 120, with glucose 141, blood cultures obtained, given 1 dose of IV Rocephin, given 1 L normal saline bolus.  Troponins flat.   Lactic acid 2.6 then within normal limits.   Review Of Systems: Per HPI  Pertinent Past Medical History: CAD Anemia Diverticulosis HTN HLD Prior CVA Remainder reviewed in history tab.   Pertinent Past Surgical History: reviewed in history tab.   Pertinent Social History: Tobacco use: Never, per patient's daughters Alcohol use: None Other Substance use: None Lives with husband with HH aide or otherwise [facility].  Pertinent Family History: Mother - DM Remainder reviewed in history tab.   Important Outpatient Medications:  Losartan  100 mg  Namenda  10 mg BID Lopressor  25 mg BID Zocor  40 mg every day Spironolactone  25 mg qd Remainder reviewed in medication history.   Objective: BP (!) 170/85   Pulse 96   Temp 98.6 F (37 C) (Rectal)   Resp (!) 23   Ht 5' 4 (1.626 m)   Wt 70 kg   SpO2 100%   BMI 26.49 kg/m   Exam: General: Frail woman lying in bed in no acute distress CV: Regular rate, irregular rhythm, no murmurs Pulm: Clear to auscultation bilaterally Abdomen: Soft, nontender, nondistended, no palpable organomegaly Psych: Alert and oriented to person, place, and situation, able to answer line of questioning with some speech delay and soft tone, follows line of questioning initially but then stops answering Neuro: Pupils equal and reactive to light, able to squeeze right hand, not able to squeeze the left hand, left arm held in contracted flexor position, right arm somewhat less contracted, lower extremities are bilaterally taught, 1+ right patellar reflex, no left patellar reflex Skin: No observable wounds on the feet, legs, arms, pre-existing sacral wound with imaging documented in media tab  Labs:  CBC BMET  Recent Labs  Lab 02/16/24 1109 02/16/24 1200  WBC 13.7*  --   HGB 12.0 13.3  HCT 35.2* 39.0  PLT 267  --    Recent Labs  Lab 02/16/24 1109 02/16/24 1200  NA 121* 120*  K 4.1 4.1  CL 85* 85*  CO2 22  --   BUN 17 21  CREATININE 0.88 0.80   GLUCOSE 136* 141*  CALCIUM 9.1  --      Albumin 3.1 AST 109 ALT 39 Troponin 38>36. Lactic acid 2.6>1.8  Urinalysis    Component Value Date/Time   COLORURINE YELLOW 02/16/2024 1109   APPEARANCEUR CLEAR 02/16/2024 1109   LABSPEC 1.024 02/16/2024 1109   PHURINE 6.0 02/16/2024 1109   GLUCOSEU NEGATIVE 02/16/2024 1109   GLUCOSEU NEGATIVE 11/02/2021 1546   HGBUR NEGATIVE 02/16/2024 1109   BILIRUBINUR NEGATIVE 02/16/2024 1109   BILIRUBINUR neg 01/06/2023 1407   KETONESUR NEGATIVE 02/16/2024 1109   PROTEINUR 30 (A) 02/16/2024 1109   UROBILINOGEN 0.2 01/06/2023 1407   UROBILINOGEN 1.0 11/02/2021 1546   NITRITE NEGATIVE 02/16/2024 1109   LEUKOCYTESUR NEGATIVE 02/16/2024 1109    EKG: Tachycardic with abnormal rhythm, due to intermittent premature contractions.  No PR interval prolongation.  Significant baseline activity.   Imaging Studies Performed:  Chest xray 02/16/24 Rads impression 1. Hypoinflation of the lungs. 2. No acute cardiopulmonary process. My Impression: Abnormal curvature of the trachea that is not explained by patient rotation.  Lungs are hypoinflated but no evidence of pneumothorax.  Costophrenic angles without blunting.  No bony abnormalities noted. appears  to have significant stool burden in the colon   MR BRAIN WO CONTRAST Result Date: 02/16/2024 EXAM: MR Brain without Intravenous Contrast. CLINICAL HISTORY: Weakness. TECHNIQUE: Magnetic resonance images of the brain without intravenous contrast in multiple planes. CONTRAST: Without. COMPARISON: MRI head October 08, 2021 FINDINGS: BRAIN: No restricted diffusion to indicate acute infarction. No intracranial mass or hemorrhage. No midline shift or extra-axial fluid collection. The central arterial and venous flow voids are patent. Patchy FLAIR signal abnormality throughout the subcortical and periventricular white matter likely the sequela of mild chronic white matter microangiopathy. Cerebral atrophy. VENTRICLES: No  hydrocephalus. ORBITS: The orbits are normal. SINUSES AND MASTOIDS: The sinuses and mastoid air cells are clear. BONES: No acute fracture or focal osseous lesion. IMPRESSION: 1. Unremarkable brain MRI. Electronically signed by: Gilmore Molt MD 02/16/2024 06:27 PM EDT RP Workstation: [id number]   CT HEAD WO CONTRAST Result Date: 02/16/2024 CLINICAL DATA:  Fatigue and hypotension. EXAM: CT HEAD WITHOUT CONTRAST TECHNIQUE: Contiguous axial images were obtained from the base of the skull through the vertex without intravenous contrast. RADIATION DOSE REDUCTION: This exam was performed according to the departmental dose-optimization program which includes automated exposure control, adjustment of the mA and/or kV according to patient size and/or use of iterative reconstruction technique. COMPARISON:  November 23, 2022 FINDINGS: Brain: There is generalized cerebral atrophy with widening of the extra-axial spaces and ventricular dilatation. There are areas of decreased attenuation within the white matter tracts of the supratentorial brain, consistent with microvascular disease changes. Normal basal ganglia. Normal bilateral thalami. Vascular: Moderate severity bilateral cavus carotid artery calcification is noted. Skull: Normal. Negative for fracture or focal lesion. Sinuses/Orbits: No acute finding. Other: None. IMPRESSION: 1. Generalized cerebral atrophy and microvascular disease changes of the supratentorial brain. 2. No acute intracranial abnormality. Electronically Signed   By: Suzen Dials M.D.   On: 02/16/2024 16:06   CT ABDOMEN PELVIS W CONTRAST Result Date: 02/16/2024 EXAM: CT ABDOMEN AND PELVIS WITH CONTRAST 02/16/2024 01:33:00 PM TECHNIQUE: CT of the abdomen and pelvis was performed with the administration of 75 mL of iohexol (OMNIPAQUE) 350 MG/ML injection. Multiplanar reformatted images are provided for review. Automated exposure control, iterative reconstruction, and/or weight-based adjustment of  the mA/kV was utilized to reduce the radiation dose to as low as reasonably achievable. COMPARISON: None available. CLINICAL HISTORY: Sepsis. 75ml omni 350 IV. PT BIB [facility] from PCP (initial went to pcp d/t sacral wound) d/t Lethargy noticed by provider. Upon ems arrival pt was responsive to to voice and hypotensive 70s. FINDINGS: LOWER CHEST: 6 mm nodule seen in right middle lobe. LIVER: The liver is unremarkable. GALLBLADDER AND BILE DUCTS: Gallbladder is unremarkable. No biliary ductal dilatation. SPLEEN: No acute abnormality. PANCREAS: No acute abnormality. ADRENAL GLANDS: No acute abnormality. KIDNEYS, URETERS AND BLADDER: Simple left renal cyst is noted. Per consensus, no follow-up is needed for simple Bosniak type 1 and 2 renal cysts, unless the patient has a malignancy history or risk factors. No stones in the kidneys or ureters. No hydronephrosis. No perinephric or periureteral stranding. Urinary bladder is unremarkable. GI AND BOWEL: Stomach demonstrates no acute abnormality. There is no bowel obstruction. PERITONEUM AND RETROPERITONEUM: No ascites. No free air. VASCULATURE: Atherosclerosis of abdominal aorta is noted. LYMPH NODES: No lymphadenopathy. REPRODUCTIVE ORGANS: No acute abnormality. BONES AND SOFT TISSUES: Status post right total hip arthroplasty. No acute osseous abnormality. No focal soft tissue abnormality. IMPRESSION: 1. Incidental 6 mm right middle lobe pulmonary nodule. Recommend non-contrast chest CT in 612 months; if stable,  consider follow-up at 1824 months based on risk. 2. Simple left renal cyst. 3. Status post right total hip arthroplasty. 4. Atherosclerosis of the abdominal aorta. Electronically signed by: Lynwood Seip MD 02/16/2024 02:02 PM EDT RP Workstation: [id number]      Alena Morrison, Elio, MD 02/16/2024, 2:24 PM PGY-1, [facility]  FPTS Intern pager: [phone], text pages welcome Secure chat group [facility] Teaching Service    Upper Level Addendum:   I have seen and evaluated this patient along with Dr. Alena and reviewed the above note, making necessary revisions as appropriate.  I agree with the medical decision making and physical exam as noted above.   Izetta Nap, DO PGY-3, [facility] Family Medicine Residency

## 2024-02-16 NOTE — Plan of Care (Signed)
 FMTS Brief Progress Note  S: Patient sleeping in bed. She awoke to stimulation and reports feeling okay. She did fall back asleep.   O: BP (!) 93/56 (BP Location: Left Arm)   Pulse 78   Temp 97.8 F (36.6 C) (Oral)   Resp 17   Ht 5' 4 (1.626 m)   Wt 58.6 kg   SpO2 99%   BMI 22.18 kg/m    General: Sleeping in bed, sleepy upon waking, NAD Cardiovascular: RRR, no m/r/g appreciated Pulmonary: Normal WOB. CTAB with no w/c/r present Neurologic: Awakes to stimulation. Answers some questions. Extremities: Bilateral arms contracted  A/P: Hyponatremia Na 121 on admission but this appears to be chronic. Patient now s/p 1L NS with repeat Na 123. - Nephrology consulted, appreciate recommendations - Nephrology is following labs and will adjust fluids as necessary  AMS Imaging did not show acute stroke. Likely multifactorial. - Plan as per day team  Orders reviewed. Labs for AM ordered, which was adjusted as needed.   Rest of plan per day team.  Jerrie Gathers, DO 02/16/2024, 9:49 PM PGY-1, [facility] Family Medicine Night Resident  Please page [phone] with questions.

## 2024-02-16 NOTE — Assessment & Plan Note (Addendum)
 dementia - continue namenda  hypertension - Hold home lopressor  25 mg BID, spironolactone  25 mg daily, losartan  100 mg CAD, HLD - zocor  40 mg every day

## 2024-02-16 NOTE — ED Notes (Signed)
 After many attempts by staff, was not able to successfully draw pt's 2nd Blood culture.

## 2024-02-16 NOTE — ED Notes (Signed)
 Pt in MRI at this time

## 2024-02-16 NOTE — ED Notes (Signed)
 CCMD Called

## 2024-02-16 NOTE — ED Triage Notes (Signed)
 PT BIB [facility] from PCP (initial went to pcp d/t sacral wound) d/t Lethargy noticed by provider. Upon ems arrival pt  was responsive to to voice and hypotensive 70s. 140/86 Ao/x1 baseline' more alert then now 100 RA CBG 134 No intervention by EMS Left side contracted (previous stroke)

## 2024-02-16 NOTE — ED Provider Notes (Signed)
 [place] EMERGENCY DEPARTMENT AT [facility] Provider Note   CSN: [id number] Arrival date & time: 02/16/24  1053     Patient presents with: Fatigue and Hypotension   Felicia Cross is a 89 y.o. female.   89 year old female presenting emergency department by EMS after being found lethargic/near syncope at her PCP doctor's office today.  She is alert to person per EMS which is at her baseline and has baseline left-sided deficits from contractures.  EMS reported hypotension with blood pressure in the 70s which improved in transit with most recent 140/86.  Her mentation has improved.  She is able to tell me her name denies pain and is able to tell me that she is at [facility].        Prior to Admission medications   Medication Sig Start Date End Date Taking? Authorizing Provider  acetaminophen  (TYLENOL ) 325 MG tablet Take 975 mg by mouth See admin instructions. Take 3 tablets (975 mg) by mouth three times daily, may also take 2 tablets (650 mg) by mouth every 6 hours as needed for moderate pain/headache   Yes [provider]  losartan  (COZAAR ) 100 MG tablet Take 1 tablet (100 mg total) by mouth daily. 06/12/23  Yes Mercer Clotilda SAUNDERS, MD  memantine  (NAMENDA ) 10 MG tablet TAKE 1 TABLET BY MOUTH TWICE DAILY**MUST KEEP UPCOMING APPOINTMENT WITH NP 07/24/2023** Patient taking differently: Take 10 mg by mouth 2 (two) times daily. 08/28/23  Yes Millikan, Megan, NP  metoprolol  tartrate (LOPRESSOR ) 25 MG tablet Take 1 tablet (25 mg total) by mouth 2 (two) times daily. Patient taking differently: Take 25 mg by mouth daily. 06/12/23  Yes Mercer Clotilda SAUNDERS, MD  Multiple Vitamin-Folic Acid  TABS Take 1 tablet by mouth every morning.   Yes [provider]  simvastatin  (ZOCOR ) 40 MG tablet Take 1 tablet (40 mg total) by mouth daily. 06/12/23  Yes Mercer Clotilda SAUNDERS, MD  spironolactone  (ALDACTONE ) 25 MG tablet Take 1 tablet by mouth once daily 06/12/23  Yes Mercer Clotilda SAUNDERS, MD  Blood  Pressure Monitoring (BLOOD PRESSURE CUFF) MISC Use as directed for checking bp daily. 09/29/21   Mercer Clotilda SAUNDERS, MD    Allergies: Codeine and Hydrocodone    Review of Systems  Updated Vital Signs BP (!) 144/90   Pulse 91   Temp 98.6 F (37 C) (Rectal)   Resp 18   Ht 5' 4 (1.626 m)   Wt 70 kg   SpO2 100%   BMI 26.49 kg/m   Physical Exam Vitals and nursing note reviewed.  Constitutional:      General: She is not in acute distress.    Appearance: She is not toxic-appearing.  HENT:     Head: Normocephalic.     Nose: Nose normal.     Mouth/Throat:     Mouth: Mucous membranes are moist.  Eyes:     Conjunctiva/sclera: Conjunctivae normal.  Cardiovascular:     Rate and Rhythm: Normal rate and regular rhythm.  Pulmonary:     Effort: Pulmonary effort is normal.     Breath sounds: Normal breath sounds.  Abdominal:     General: Abdomen is flat. There is no distension.     Palpations: Abdomen is soft.     Tenderness: There is no abdominal tenderness. There is no guarding or rebound.  Musculoskeletal:        General: Normal range of motion.  Skin:    General: Skin is dry.     Capillary Refill:  Capillary refill takes less than 2 seconds.  Neurological:     Mental Status: She is alert. Mental status is at baseline.     Comments: Contracted on left  Psychiatric:        Mood and Affect: Mood normal.        Behavior: Behavior normal.     (all labs ordered are listed, but only abnormal results are displayed) Labs Reviewed  CBC WITH DIFFERENTIAL/PLATELET - Abnormal; Notable for the following components:      Result Value   WBC 13.7 (*)    RBC 3.74 (*)    HCT 35.2 (*)    Neutro Abs 11.6 (*)    Monocytes Absolute 1.2 (*)    Abs Immature Granulocytes 0.12 (*)    All other components within normal limits  COMPREHENSIVE METABOLIC PANEL WITH GFR - Abnormal; Notable for the following components:   Sodium 121 (*)    Chloride 85 (*)    Glucose, Bld 136 (*)    Total Protein  6.0 (*)    Albumin 3.1 (*)    AST 109 (*)    All other components within normal limits  URINALYSIS, ROUTINE W REFLEX MICROSCOPIC - Abnormal; Notable for the following components:   Protein, ur 30 (*)    Bacteria, UA RARE (*)    All other components within normal limits  I-STAT CHEM 8, ED - Abnormal; Notable for the following components:   Sodium 120 (*)    Chloride 85 (*)    Glucose, Bld 141 (*)    All other components within normal limits  I-STAT CG4 LACTIC ACID, ED - Abnormal; Notable for the following components:   Lactic Acid, Venous 2.6 (*)    All other components within normal limits  TROPONIN I (HIGH SENSITIVITY) - Abnormal; Notable for the following components:   Troponin I (High Sensitivity) 38 (*)    All other components within normal limits  RESP PANEL BY RT-PCR (RSV, FLU A&B, COVID)  RVPGX2  CULTURE, BLOOD (ROUTINE X 2)  CULTURE, BLOOD (ROUTINE X 2)  I-STAT CG4 LACTIC ACID, ED  TROPONIN I (HIGH SENSITIVITY)    EKG: EKG Interpretation Date/Time:  Friday February 16 2024 11:19:43 EDT Ventricular Rate:  104 PR Interval:  155 QRS Duration:  89 QT Interval:  330 QTC Calculation: 430 R Axis:   -25  Text Interpretation: Sinus tachycardia Atrial premature complex Borderline left axis deviation Minimal ST elevation, inferior leads Confirmed by Neysa Clap [phone]) on 02/16/2024 1:02:46 PM  Radiology: CT ABDOMEN PELVIS W CONTRAST Result Date: 02/16/2024 EXAM: CT ABDOMEN AND PELVIS WITH CONTRAST 02/16/2024 01:33:00 PM TECHNIQUE: CT of the abdomen and pelvis was performed with the administration of 75 mL of iohexol (OMNIPAQUE) 350 MG/ML injection. Multiplanar reformatted images are provided for review. Automated exposure control, iterative reconstruction, and/or weight-based adjustment of the mA/kV was utilized to reduce the radiation dose to as low as reasonably achievable. COMPARISON: None available. CLINICAL HISTORY: Sepsis. 75ml omni 350 IV. PT BIB [facility] from PCP (initial  went to pcp d/t sacral wound) d/t Lethargy noticed by provider. Upon ems arrival pt was responsive to to voice and hypotensive 70s. FINDINGS: LOWER CHEST: 6 mm nodule seen in right middle lobe. LIVER: The liver is unremarkable. GALLBLADDER AND BILE DUCTS: Gallbladder is unremarkable. No biliary ductal dilatation. SPLEEN: No acute abnormality. PANCREAS: No acute abnormality. ADRENAL GLANDS: No acute abnormality. KIDNEYS, URETERS AND BLADDER: Simple left renal cyst is noted. Per consensus, no follow-up is needed for simple Bosniak type  1 and 2 renal cysts, unless the patient has a malignancy history or risk factors. No stones in the kidneys or ureters. No hydronephrosis. No perinephric or periureteral stranding. Urinary bladder is unremarkable. GI AND BOWEL: Stomach demonstrates no acute abnormality. There is no bowel obstruction. PERITONEUM AND RETROPERITONEUM: No ascites. No free air. VASCULATURE: Atherosclerosis of abdominal aorta is noted. LYMPH NODES: No lymphadenopathy. REPRODUCTIVE ORGANS: No acute abnormality. BONES AND SOFT TISSUES: Status post right total hip arthroplasty. No acute osseous abnormality. No focal soft tissue abnormality. IMPRESSION: 1. Incidental 6 mm right middle lobe pulmonary nodule. Recommend non-contrast chest CT in 612 months; if stable, consider follow-up at 1824 months based on risk. 2. Simple left renal cyst. 3. Status post right total hip arthroplasty. 4. Atherosclerosis of the abdominal aorta. Electronically signed by: Lynwood Seip MD 02/16/2024 02:02 PM EDT RP Workstation: [id number]   DG Chest 2 View Result Date: 02/16/2024 EXAM: 2 VIEW(S) XRAY OF THE CHEST 02/16/2024 11:56:00 AM COMPARISON: 07/23/2021 CLINICAL HISTORY: near syncope. Reason for exam: near syncope; Best images obtainable due to patient limited mobility/constriction near syncope FINDINGS: LUNGS AND PLEURA: Hypoinflation of the lungs is noted. No focal pulmonary opacity. No pulmonary edema. No pleural effusion.  No pneumothorax. HEART AND MEDIASTINUM: No acute abnormality of the cardiac and mediastinal silhouettes. BONES AND SOFT TISSUES: Degenerative changes are seen involving both shoulders. IMPRESSION: 1. Hypoinflation of the lungs. 2. No acute cardiopulmonary process. Electronically signed by: Lynwood Seip MD 02/16/2024 12:20 PM EDT RP Workstation: [id number]     Procedures   Medications Ordered in the ED  sodium chloride  0.9 % bolus 1,000 mL (1,000 mLs Intravenous New Bag/Given 02/16/24 1236)  cefTRIAXone (ROCEPHIN) 2 g in sodium chloride  0.9 % 100 mL IVPB (2 g Intravenous New Bag/Given 02/16/24 1430)  iohexol (OMNIPAQUE) 350 MG/ML injection 75 mL (75 mLs Intravenous Contrast Given 02/16/24 1335)    Clinical Course as of 02/16/24 1510  Fri Feb 16, 2024  1112 Echo 2023 per chart review: IMPRESSIONS     1. Left ventricular ejection fraction, by estimation, is 60 to 65%. The  left ventricle has normal function. The left ventricle has no regional  wall motion abnormalities. There is moderate left ventricular hypertrophy  of the basal segment. Left  ventricular diastolic parameters are consistent with Grade I diastolic  dysfunction (impaired relaxation).   2. Right ventricular systolic function is normal. The right ventricular  size is normal. There is mildly elevated pulmonary artery systolic  pressure.   3. No evidence of mitral valve regurgitation.   4. Aortic valve regurgitation is not visualized. Aortic valve  sclerosis/calcification is present, without any evidence of aortic  stenosis.   5. The inferior vena cava is dilated in size with >50% respiratory  variability, suggesting right atrial pressure of 8 mmHg.   [TY]  1215 Lactic Acid, Venous(!!): 2.6 IVFs started [TY]  1258 Sodium(!): 121 Hyponatremic.  No recent values to compare to last year was 130.  [TY]  1302 Troponin I (High Sensitivity)(!): 38 Mild elevation [TY]  1435 Spoke with family medicine resident for admission.  [TY]    Clinical Course User Index [TY] Neysa Caron PARAS, DO                                 Medical Decision Making This is an 89 year old female with complex past medical history to include hypertension, hyperlipidemia prior stroke, CAD B12 deficiency presenting emergency department from PCPs  office by way of EMS after what sounds like a transient hypotension/near syncope episode.  EMS reported blood pressure has improved as well as her mentation.  She is able to tell me her name that she is at [facility] but not able to provide much more meaningful history.  Does not appear to be in obvious distress.  Lungs are clear.  Benign abdominal exam.  Will get screening labs, EKG and chest x-ray.  Will try to get further history from family once they arrive to the ED.  See ED course for further MDM and disposition.  Amount and/or Complexity of Data Reviewed Independent Historian:     Details: Daughter notes mother's baseline typically alert to person and sometimes place.  Notes that she is still sleepy. External Data Reviewed:     Details: Unable to see records of outpatient visit today. Labs: ordered. Decision-making details documented in ED Course. Radiology: ordered and independent interpretation performed.    Details: No free air on CT scan.  No obvious pneumonia on chest x-ray ECG/medicine tests: ordered and independent interpretation performed.    Details: No ischemic changes.  Risk Prescription drug management. Decision regarding hospitalization. Diagnosis or treatment significantly limited by social determinants of health. Risk Details: Dementia.        Final diagnoses:  Hyponatremia    ED Discharge Orders     None          Neysa Caron PARAS, DO 02/16/24 1510

## 2024-02-16 NOTE — ED Notes (Signed)
 Pt off floor from MRI to [facility]

## 2024-02-17 ENCOUNTER — Inpatient Hospital Stay (HOSPITAL_COMMUNITY): Payer: Medicare (Managed Care)

## 2024-02-17 DIAGNOSIS — E46 Unspecified protein-calorie malnutrition: Secondary | ICD-10-CM

## 2024-02-17 DIAGNOSIS — I10 Essential (primary) hypertension: Secondary | ICD-10-CM

## 2024-02-17 DIAGNOSIS — E785 Hyperlipidemia, unspecified: Secondary | ICD-10-CM

## 2024-02-17 DIAGNOSIS — G9341 Metabolic encephalopathy: Secondary | ICD-10-CM

## 2024-02-17 LAB — BASIC METABOLIC PANEL WITH GFR
Anion gap: 13 (ref 5–15)
BUN: 16 mg/dL (ref 8–23)
CO2: 20 mmol/L — ABNORMAL LOW (ref 22–32)
Calcium: 8.6 mg/dL — ABNORMAL LOW (ref 8.9–10.3)
Chloride: 88 mmol/L — ABNORMAL LOW (ref 98–111)
Creatinine, Ser: 0.66 mg/dL (ref 0.44–1.00)
GFR, Estimated: >60 mL/min (ref >60)
Glucose, Bld: 76 mg/dL (ref 70–99)
Potassium: 5 mmol/L (ref 3.5–5.1)
Sodium: 121 mmol/L — ABNORMAL LOW (ref 135–145)

## 2024-02-17 LAB — OSMOLALITY, URINE: Osmolality, Ur: 547 mosm/kg (ref 300–900)

## 2024-02-17 LAB — CBC
HCT: 30 % — ABNORMAL LOW (ref 36.0–46.0)
HCT: 33.6 % — ABNORMAL LOW (ref 36.0–46.0)
Hemoglobin: 10 g/dL — ABNORMAL LOW (ref 12.0–15.0)
Hemoglobin: 11.5 g/dL — ABNORMAL LOW (ref 12.0–15.0)
MCH: 31.7 pg (ref 26.0–34.0)
MCH: 32.5 pg (ref 26.0–34.0)
MCHC: 33.3 g/dL (ref 30.0–36.0)
MCHC: 34.2 g/dL (ref 30.0–36.0)
MCV: 94.9 fL (ref 80.0–100.0)
MCV: 95.2 fL (ref 80.0–100.0)
Platelets: 240 K/uL (ref 150–400)
Platelets: 241 K/uL (ref 150–400)
RBC: 3.15 MIL/uL — ABNORMAL LOW (ref 3.87–5.11)
RBC: 3.54 MIL/uL — ABNORMAL LOW (ref 3.87–5.11)
RDW: 13.1 % (ref 11.5–15.5)
RDW: 13.2 % (ref 11.5–15.5)
WBC: 10.6 K/uL — ABNORMAL HIGH (ref 4.0–10.5)
WBC: 11 K/uL — ABNORMAL HIGH (ref 4.0–10.5)
nRBC: 0 % (ref 0.0–0.2)
nRBC: 0 % (ref 0.0–0.2)

## 2024-02-17 LAB — SODIUM, URINE, RANDOM: Sodium, Ur: <30 mmol/L

## 2024-02-17 LAB — SODIUM
Sodium: 125 mmol/L — ABNORMAL LOW (ref 135–145)
Sodium: 124 mmol/L — ABNORMAL LOW (ref 135–145)
Sodium: 128 mmol/L — ABNORMAL LOW (ref 135–145)
Sodium: 131 mmol/L — ABNORMAL LOW (ref 135–145)

## 2024-02-17 LAB — OSMOLALITY
Osmolality: 264 mosm/kg — ABNORMAL LOW (ref 275–295)
Osmolality: 273 mosm/kg — ABNORMAL LOW (ref 275–295)

## 2024-02-17 LAB — GLUCOSE, CAPILLARY
Glucose-Capillary: 80 mg/dL (ref 70–99)
Glucose-Capillary: 89 mg/dL (ref 70–99)

## 2024-02-17 LAB — PROCALCITONIN: Procalcitonin: 0.79 ng/mL

## 2024-02-17 LAB — MRSA NEXT GEN BY PCR, NASAL: MRSA by PCR Next Gen: NOT DETECTED

## 2024-02-17 MED ORDER — SODIUM CHLORIDE 3 % IV SOLN
INTRAVENOUS | Status: DC
  Filled 2024-02-17 (×2): qty 500

## 2024-02-17 MED ORDER — HYDRALAZINE HCL 20 MG/ML IJ SOLN
10.0000 mg | Freq: Four times a day (QID) | INTRAMUSCULAR | Status: DC | PRN
  Administered 2024-02-17: 10 mg via INTRAVENOUS
  Filled 2024-02-17: qty 1

## 2024-02-17 MED ORDER — SODIUM CHLORIDE 3 % IV BOLUS: 100.0000 mL | Freq: Once | INTRAVENOUS | Status: DC

## 2024-02-17 MED ORDER — CHLORHEXIDINE GLUCONATE CLOTH 2 % EX PADS
6.0000 | MEDICATED_PAD | Freq: Every day | CUTANEOUS | Status: DC
  Administered 2024-02-17 – 2024-02-19 (×3): 6 via TOPICAL

## 2024-02-17 MED ORDER — SODIUM CHLORIDE 3 % IV SOLN
INTRAVENOUS | Status: DC
Start: 2024-02-17 — End: 2024-02-17

## 2024-02-17 MED ORDER — OSMOLITE 1.5 CAL PO LIQD
1000.0000 mL | ORAL | Status: DC
Start: 2024-02-17 — End: 2024-02-19
  Administered 2024-02-17 – 2024-02-18 (×2): 1000 mL
  Filled 2024-02-17: qty 1000

## 2024-02-17 MED ORDER — MEDIHONEY WOUND/BURN DRESSING EX PSTE
1.0000 | PASTE | Freq: Every day | CUTANEOUS | Status: DC
  Administered 2024-02-17 – 2024-02-22 (×6): 1 via TOPICAL
  Filled 2024-02-17: qty 44

## 2024-02-17 MED ORDER — CEFTRIAXONE SODIUM 2 G IJ SOLR
2.0000 g | INTRAMUSCULAR | Status: AC
  Administered 2024-02-17 – 2024-02-20 (×4): 2 g via INTRAVENOUS
  Filled 2024-02-17 (×4): qty 20

## 2024-02-17 MED ORDER — SODIUM CHLORIDE 3 % IV BOLUS
250.0000 mL | Freq: Once | INTRAVENOUS | Status: DC
  Filled 2024-02-17: qty 500

## 2024-02-17 MED ORDER — PROSOURCE TF20 ENFIT COMPATIBL EN LIQD
60.0000 mL | Freq: Every day | ENTERAL | Status: DC
  Administered 2024-02-17 – 2024-02-22 (×6): 60 mL
  Filled 2024-02-17 (×7): qty 60

## 2024-02-17 MED ORDER — SODIUM CHLORIDE 3 % IV SOLN
INTRAVENOUS | Status: DC
  Filled 2024-02-17: qty 500

## 2024-02-17 MED ORDER — SODIUM CHLORIDE 0.9 % IV BOLUS
500.0000 mL | Freq: Once | INTRAVENOUS | Status: AC
  Administered 2024-02-17: 500 mL via INTRAVENOUS

## 2024-02-17 NOTE — Progress Notes (Signed)
 Patient transferred to 3M02 via bed.

## 2024-02-17 NOTE — Consult Note (Signed)
 WOC Nurse Consult Note: Reason for Consult: Requested to assess a sacrum wound. Wound type: PI stage 3 on coccyx. Pressure Injury POA: Yes Measurement: aprox. 3 cm x 2 cm. Scar tissue on left. Wound bed: 90% pale red, 105 yellow/gray eschar. Drainage (amount, consistency, odor) Minimum, serosanguinous. Periwound: intact, discolored scar tissue around, viable edges. Dressing procedure/placement/frequency: Cleanse with Vashe [id number], not rinse, pat dry the peri-wound skin. Apply Medihoney daily into the wound bed, cover with foam dressing. The foam can stay up to 3 days if not saturated or soiling. Ok to lift and reapply the Medihoney.  Left Elbow: dry scab 1.5 cm x 1.5 cm aprox. Healed tissue in edges, none drainage. Dressing procedure/placement/frequency: Cleanse with saline, pat dry the peri-wound skin. Apply Medihoney daily into the wound bed, cover with foam dressing. The foam can stay up to 3 days if not saturated or soiling. Ok to lift and reapply the Medihoney.  WOC team will not plan to follow further. Please reconsult if further assistance is needed. Thank-you,  Lela Holm RN, CNS, [employer], MSN.  (Phone [phone])

## 2024-02-17 NOTE — Progress Notes (Signed)
 eLink Physician-Brief Progress Note Patient Name: Felicia Cross DOB: 02-May-1933 MRN: [id number]   Date of Service  02/17/2024  HPI/Events of Note  89 year old with a history of dementia, metabolic syndrome, left-sided deficits from contractures who presents with decreased responsiveness and hypotension found to have hyponatremia.  Transferred to ICU for initiation of hypertonic saline.  Vitals, laboratory studies, and imaging reviewed.  eICU Interventions  3% saline in place, nephrology following and managing  Not tolerating oral intake, may need to consider supplementation  DVT prophylaxis with Lovenox  GI prophylaxis not indicated        Wiliam Cauthorn 02/17/2024, 3:58 AM

## 2024-02-17 NOTE — Consult Note (Deleted)
 WOC Nurse Consult Note: Reason for Consult: sacral wound  Wound type: 1. Stage 3 Pressure Injury sacrum 50%  red moist 25% dark purple 25% tan  2.  Full thickness L elbow likely r/t friction  50% dry pink 50% dry brown  3. Stage 2 Pressure Injuries B buttocks pink  Pressure Injury POA: Yes Measurement: see nursing flowsheet  Wound bed: as above  Drainage (amount, consistency, odor) see nursing flowsheet  Periwound:  Dressing procedure/placement/frequency:  Cleanse sacral wound with Vashe, do not rinse and allow to air dry. Apply 1/4 thick layer of Santyl to wound bed daily, cover with saline moist gauze, dry gauze and silicone foam.  Cleanse L elbow with Vashe, apply Xeroform gauze Soila [phone]) to wound bed every other day and secure with silicone foam.   Patient should remain on a low air loss mattress throughout hospitalization for pressure redistribution and moisture management.   POC discussed with bedside nurse. WOC team will not follow.  Reconsult if further needs arise.   Thank you,    Powell Bar MSN, RN-BC, [employer]

## 2024-02-17 NOTE — Progress Notes (Signed)
 [facility] Progress Note   Subjective:   due to ongoing AMS and hypoNa moved to ICU overnight for hypertonic saline - serum sodium correcting appropriately.  Mental status improving this AM.  No new issues.  No family present this AM when I saw her.   Objective Vitals:   02/17/24 0900 02/17/24 1000 02/17/24 1100 02/17/24 1149  BP: (!) 128/57 129/70 139/63   Pulse: 97 (!) 103 (!) 104   Resp: 19 19 (!) 22   Temp:    99.6 F (37.6 C)  TempSrc:    Axillary  SpO2: 99% 98% 99%   Weight:      Height:       Physical Exam General: elderly woman awake and alert in bed ENT: NG tube in place, MMM Heart: RRR Lungs: clear Abdomen:soft, thin Extremities:no edema, L arm contracture, no edema Neuro: oriented to self, place, 2005  Additional Objective Labs: Basic Metabolic Panel: Recent Labs  Lab 02/16/24 2021 02/16/24 2243 02/17/24 0257 02/17/24 0552 02/17/24 1034  NA 123* 119* 121* 125* 124*  K 4.1 4.2 5.0  --   --   CL 91* 90* 88*  --   --   CO2 20* 19* 20*  --   --   GLUCOSE 97 88 76  --   --   BUN 17 18 16   --   --   CREATININE 0.69 0.70 0.66  --   --   CALCIUM 8.4* 8.2* 8.6*  --   --   PHOS 2.9 2.9  --   --   --    Liver Function Tests: Recent Labs  Lab 02/16/24 1109 02/16/24 2021 02/16/24 2243  AST 109*  --   --   ALT 39  --   --   ALKPHOS 43  --   --   BILITOT 0.9  --   --   PROT 6.0*  --   --   ALBUMIN 3.1* 2.5* 2.4*   No results for input(s): LIPASE, AMYLASE in the last 168 hours. CBC: Recent Labs  Lab 02/16/24 1109 02/16/24 1200 02/17/24 0552  WBC 13.7*  --  11.0*  NEUTROABS 11.6*  --   --   HGB 12.0 13.3 11.5*  HCT 35.2* 39.0 33.6*  MCV 94.1  --  94.9  PLT 267  --  241   Blood Culture    Component Value Date/Time   SDES BLOOD LEFT HAND 02/16/2024 2020   SPECREQUEST  02/16/2024 2020    BOTTLES DRAWN AEROBIC ONLY Blood Culture adequate volume   CULT  02/16/2024 2020    NO GROWTH < 12 HOURS Performed at [facility]  Lab, [address] [place], [place] [address]    REPTSTATUS PENDING 02/16/2024 2020    Cardiac Enzymes: No results for input(s): CKTOTAL, CKMB, CKMBINDEX, TROPONINI in the last 168 hours. CBG: Recent Labs  Lab 02/17/24 0331 02/17/24 1126  GLUCAP 80 89   Iron Studies: No results for input(s): IRON, TIBC, TRANSFERRIN, FERRITIN in the last 72 hours. @lablastinr3 @ Studies/Results: DG Abd 1 View Result Date: 02/17/2024 EXAM: 1 VIEW XRAY OF THE ABDOMEN 02/17/2024 03:58:00 AM COMPARISON: CT scout view 02/16/2024. Portable xray 0216 hours today. CLINICAL HISTORY: 89 year old female. Encounter for nasogastric (NG) tube placement. FINDINGS: LINES, TUBES AND DEVICES: Enteric tube has been advanced, loops at the level of the stomach now. BOWEL: Nonobstructive bowel gas pattern. Bowel gas pattern appears mildly improved from the CT scout view yesterday. SOFT TISSUES: No opaque urinary  calculi. BONES: No acute osseous abnormality. IMPRESSION: 1. Satisfactory Enteric tube placement in the stomach now. Electronically signed by: Helayne Hurst MD 02/17/2024 04:07 AM EDT RP Workstation: [id number]   DG Abd 1 View Result Date: 02/17/2024 CLINICAL DATA:  Check gastric catheter placement EXAM: ABDOMEN - 1 VIEW COMPARISON:  None Available. FINDINGS: Gastric catheter is noted in the stomach. Proximal side port however lies in the distal esophagus. This should be advanced deeper into the stomach. Scattered large and small bowel gas is seen. No free air is noted. IMPRESSION: Gastric catheter as described. This should be advanced deeper into the stomach. Electronically Signed   By: Oneil Devonshire M.D.   On: 02/17/2024 02:49   MR BRAIN WO CONTRAST Result Date: 02/16/2024 EXAM: MR Brain without Intravenous Contrast. CLINICAL HISTORY: Weakness. TECHNIQUE: Magnetic resonance images of the brain without intravenous contrast in multiple planes. CONTRAST: Without. COMPARISON: MRI head October 08, 2021 FINDINGS: BRAIN:  No restricted diffusion to indicate acute infarction. No intracranial mass or hemorrhage. No midline shift or extra-axial fluid collection. The central arterial and venous flow voids are patent. Patchy FLAIR signal abnormality throughout the subcortical and periventricular white matter likely the sequela of mild chronic white matter microangiopathy. Cerebral atrophy. VENTRICLES: No hydrocephalus. ORBITS: The orbits are normal. SINUSES AND MASTOIDS: The sinuses and mastoid air cells are clear. BONES: No acute fracture or focal osseous lesion. IMPRESSION: 1. Unremarkable brain MRI. Electronically signed by: Gilmore Molt MD 02/16/2024 06:27 PM EDT RP Workstation: [id number]   US  EKG SITE RITE Result Date: 02/16/2024 If Site Rite image not attached, placement could not be confirmed due to current cardiac rhythm.  CT HEAD WO CONTRAST Result Date: 02/16/2024 CLINICAL DATA:  Fatigue and hypotension. EXAM: CT HEAD WITHOUT CONTRAST TECHNIQUE: Contiguous axial images were obtained from the base of the skull through the vertex without intravenous contrast. RADIATION DOSE REDUCTION: This exam was performed according to the departmental dose-optimization program which includes automated exposure control, adjustment of the mA and/or kV according to patient size and/or use of iterative reconstruction technique. COMPARISON:  November 23, 2022 FINDINGS: Brain: There is generalized cerebral atrophy with widening of the extra-axial spaces and ventricular dilatation. There are areas of decreased attenuation within the white matter tracts of the supratentorial brain, consistent with microvascular disease changes. Normal basal ganglia. Normal bilateral thalami. Vascular: Moderate severity bilateral cavus carotid artery calcification is noted. Skull: Normal. Negative for fracture or focal lesion. Sinuses/Orbits: No acute finding. Other: None. IMPRESSION: 1. Generalized cerebral atrophy and microvascular disease changes of the  supratentorial brain. 2. No acute intracranial abnormality. Electronically Signed   By: Suzen Dials M.D.   On: 02/16/2024 16:06   CT ABDOMEN PELVIS W CONTRAST Result Date: 02/16/2024 EXAM: CT ABDOMEN AND PELVIS WITH CONTRAST 02/16/2024 01:33:00 PM TECHNIQUE: CT of the abdomen and pelvis was performed with the administration of 75 mL of iohexol (OMNIPAQUE) 350 MG/ML injection. Multiplanar reformatted images are provided for review. Automated exposure control, iterative reconstruction, and/or weight-based adjustment of the mA/kV was utilized to reduce the radiation dose to as low as reasonably achievable. COMPARISON: None available. CLINICAL HISTORY: Sepsis. 75ml omni 350 IV. PT BIB [facility] from PCP (initial went to pcp d/t sacral wound) d/t Lethargy noticed by provider. Upon ems arrival pt was responsive to to voice and hypotensive 70s. FINDINGS: LOWER CHEST: 6 mm nodule seen in right middle lobe. LIVER: The liver is unremarkable. GALLBLADDER AND BILE DUCTS: Gallbladder is unremarkable. No biliary ductal dilatation. SPLEEN: No acute abnormality. PANCREAS: No  acute abnormality. ADRENAL GLANDS: No acute abnormality. KIDNEYS, URETERS AND BLADDER: Simple left renal cyst is noted. Per consensus, no follow-up is needed for simple Bosniak type 1 and 2 renal cysts, unless the patient has a malignancy history or risk factors. No stones in the kidneys or ureters. No hydronephrosis. No perinephric or periureteral stranding. Urinary bladder is unremarkable. GI AND BOWEL: Stomach demonstrates no acute abnormality. There is no bowel obstruction. PERITONEUM AND RETROPERITONEUM: No ascites. No free air. VASCULATURE: Atherosclerosis of abdominal aorta is noted. LYMPH NODES: No lymphadenopathy. REPRODUCTIVE ORGANS: No acute abnormality. BONES AND SOFT TISSUES: Status post right total hip arthroplasty. No acute osseous abnormality. No focal soft tissue abnormality. IMPRESSION: 1. Incidental 6 mm right middle lobe pulmonary  nodule. Recommend non-contrast chest CT in 612 months; if stable, consider follow-up at 1824 months based on risk. 2. Simple left renal cyst. 3. Status post right total hip arthroplasty. 4. Atherosclerosis of the abdominal aorta. Electronically signed by: Lynwood Seip MD 02/16/2024 02:02 PM EDT RP Workstation: [id number]   DG Chest 2 View Result Date: 02/16/2024 EXAM: 2 VIEW(S) XRAY OF THE CHEST 02/16/2024 11:56:00 AM COMPARISON: 07/23/2021 CLINICAL HISTORY: near syncope. Reason for exam: near syncope; Best images obtainable due to patient limited mobility/constriction near syncope FINDINGS: LUNGS AND PLEURA: Hypoinflation of the lungs is noted. No focal pulmonary opacity. No pulmonary edema. No pleural effusion. No pneumothorax. HEART AND MEDIASTINUM: No acute abnormality of the cardiac and mediastinal silhouettes. BONES AND SOFT TISSUES: Degenerative changes are seen involving both shoulders. IMPRESSION: 1. Hypoinflation of the lungs. 2. No acute cardiopulmonary process. Electronically signed by: Lynwood Seip MD 02/16/2024 12:20 PM EDT RP Workstation: [id number]   Medications:  sodium chloride  (hypertonic) 20 mL/hr at 02/17/24 1000    Chlorhexidine  Gluconate Cloth  6 each Topical Daily   enoxaparin  (LOVENOX ) injection  40 mg Subcutaneous Q24H   leptospermum manuka honey  1 Application Topical Daily    Assessment/Plan Felicia Cross is an 89 y.o. female with anemia, CAD, HTN, HL, OA being admitted for weakness and AMS found to have hyponatremia for which nephrology is consulted.    **Hyponatremia:  acute on chronic.  Initially appeared hypovolemic in setting of poor po intake and urine osm 587, urine sodium < 30 would support poor po intake.  Sodium did not improve substantially with isotonic volume and remained AMS overnight so started 3% saline rate 30mL/hr.  This AM serum sodium correcting and dose lowered to 20mL/hr.  10:30 follow up showing down 125 > 124 so will increase back to 76mL/hr.    Continue q4h sodium checks today.  Strict I/Os, neuro checks.   **AMS: suspect multifactorial with hypotension, hyponatremia, possible infection being ruled out.  Improving now.     **HTN: hold meds.   D/w RN - I had originally ordered a PICC which hasn't been placed yet and no issues with peripheral phlebotomy to date so will cancel the PICC as frequent serial labs should not be needed much longer.  Strict I/Os.     Remainder of issues as per primary    Manuelita Barters MD 02/17/2024, 11:50 AM  [facility] Pager: [phone]

## 2024-02-17 NOTE — Consult Note (Signed)
 NAME:  Felicia Cross, MRN:  [id number], DOB:  07/05/1932, LOS: 1 ADMISSION DATE:  02/16/2024, CONSULTATION DATE:  02/17/24 REFERRING MD: Jerrie CHIEF COMPLAINT:  Weakness   History of Present Illness:  Patient is a 89 year old female with significant medical history including dementia, hypertension, HLD, CAD, left-sided deficits secondary to contractures, and bed bound/wheel-chair bound who presented to PCP office due to decreased responsiveness/hypotension.  Per report, patient lives with daughter (who is caretaker) had approximately 2-week history of worsening weakness.  Due to hypotension being in the 70s-SBP at PCP office, patient was transported to [facility] ED by EMS.  Upon initial workup, noted that patient hyponatremic-with sodium of 121, WBC 13.7, and lactic acid of 2.6.  Patient was admitted under family medicine service for altered mental status/hyponatremia and nephrology was consulted for hyponatremia.  Patient received 1 L of normal saline and Rocephin.  Lactic acid down trended to 1.8. Initially Na increased to 123 after saline bolus but downtrended to 119.  Nephrology requesting for initiation of 3% saline and frequent neurochecks and ICU.  PCCM consulted for ICU management.  Pertinent  Medical History   Past Medical History:  Diagnosis Date   Anemia 01/18/2016   -reports on and off her whole life, on oral iron in the past remotely per report   Arthritis    B12 deficiency 01/18/2016   CAD (coronary artery disease)    CVD (cerebrovascular disease)    Diverticulosis of colon    H. pylori infection 10/20/2015   Hyperlipidemia    Hypertension    Osteoarthritis 03/07/2006   Qualifier: Diagnosis of  By: Tammie MD, [facility] Events: Including procedures, antibiotic start and stop dates in addition to other pertinent events   10/10 admitted by family medicine for hyponatremia/altered mental status 10/11 PCCM consult for transfer to initiate 3%  saline  Interim History / Subjective:    Objective    Blood pressure 135/75, pulse 82, temperature 97.8 F (36.6 C), temperature source Oral, resp. rate 19, height 5' 4 (1.626 m), weight 58.6 kg, SpO2 100%.        Intake/Output Summary (Last 24 hours) at 02/17/2024 0239 Last data filed at 02/16/2024 2011 Gross per 24 hour  Intake 0 ml  Output 400 ml  Net -400 ml   Filed Weights   02/16/24 1122 02/16/24 1822  Weight: 70 kg 58.6 kg    Examination: General: acute on chronic elderly deconditioned female, lying in icu bed  HEENT: Normocephalic, PERRLA intact, missing teeth  CV: s1,s2, RRR, no MRG, No JVD  pulm: clear, diminished, no distress  Abs: bs active, soft  Extremities: no edema, no deformity, left upper extremity contractures, moves extremities/weak  Skin: no rash, pressure injury  Neuro: Rass 0, alert to self, place, disoriented to time/situation  GU: [email or web address]          Resolved problem list   Assessment and Plan  Hyponatremia In setting of hypovolemia versus SIADH - Unable to obtain urine sample for urine Osmo/sodium P: Transfer to ICU for administration of 3% per nephrology's recommendations-30 mL an hour Seizure precautions, frequent neurochecks while on 3% Continue to trend sodiums Q4 hour Performed In and out to obtain urine sodium/osmolarity  Acute metabolic encephalopathy Altered mental status Dementia Suspect hyponatremia with advancing dementia +/-infectious etiology-negative for pneumonia, negative for UTI Blood cultures obtained-pending, lactic acid trending down, sacral wound- looks chronic/with some dried eschar- stage 3 -CT scan of head showing cerebral atrophy/microvascular disease,  negative for acute intracranial abnormality.  MRI negative for CVA. P: Limit sedating medications Initiate delirium precautions as well as aspiration precautions Continue to treat hyponatremia as above Goals of care below Received rocephin- send procal,  trend WBC/fever curve  Wound consult   HTN HLD CAD P: Hold oral antihypertensives/statin for now  Hydralazine  prn for SBP >160  Continue lovenox  for DVT ppx   Malnutrition P: RD consult in a.m. Recommend speech evaluation as well as initiation of tube feeds via NG tube in AM, will need to advance NG tube- in distal esophagus- repeat abdominal x-ray  Sacral wound/pressure injury P: Wound care consult  Goals of care With suspicion of advancing dementia, and overall deconditioned state with protein malnutrition, would recommend discussing goals of care with family P: Currently DNR limited CODE STATUS-which is appropriate Obtain palliative consult in a.m.    Labs   CBC: Recent Labs  Lab 02/16/24 1109 02/16/24 1200  WBC 13.7*  --   NEUTROABS 11.6*  --   HGB 12.0 13.3  HCT 35.2* 39.0  MCV 94.1  --   PLT 267  --     Basic Metabolic Panel: Recent Labs  Lab 02/16/24 1109 02/16/24 1200 02/16/24 2020 02/16/24 2021 02/16/24 2243  NA 121* 120* 123* 123* 119*  K 4.1 4.1 4.0 4.1 4.2  CL 85* 85* 89* 91* 90*  CO2 22  --  21* 20* 19*  GLUCOSE 136* 141* 100* 97 88  BUN 17 21 17 17 18   CREATININE 0.88 0.80 0.71 0.69 0.70  CALCIUM 9.1  --  8.5* 8.4* 8.2*  PHOS  --   --   --  2.9 2.9   GFR: Estimated Creatinine Clearance: 42 mL/min (by C-G formula based on SCr of 0.7 mg/dL). Recent Labs  Lab 02/16/24 1109 02/16/24 1200 02/16/24 1432  WBC 13.7*  --   --   LATICACIDVEN  --  2.6* 1.8    Liver Function Tests: Recent Labs  Lab 02/16/24 1109 02/16/24 2021 02/16/24 2243  AST 109*  --   --   ALT 39  --   --   ALKPHOS 43  --   --   BILITOT 0.9  --   --   PROT 6.0*  --   --   ALBUMIN 3.1* 2.5* 2.4*   No results for input(s): LIPASE, AMYLASE in the last 168 hours. No results for input(s): AMMONIA in the last 168 hours.  ABG    Component Value Date/Time   TCO2 25 02/16/2024 1200     Coagulation Profile: No results for input(s): INR, PROTIME in the  last 168 hours.  Cardiac Enzymes: No results for input(s): CKTOTAL, CKMB, CKMBINDEX, TROPONINI in the last 168 hours.  HbA1C: Hgb A1c MFr Bld  Date/Time Value Ref Range Status  12/18/2020 10:42 AM 5.9 4.6 - 6.5 % Final    Comment:    Glycemic Control Guidelines for People with Diabetes:Non Diabetic:  <6%Goal of Therapy: <7%Additional Action Suggested:  >8%   10/21/2019 12:10 PM 6.0 4.6 - 6.5 % Final    Comment:    Glycemic Control Guidelines for People with Diabetes:Non Diabetic:  <6%Goal of Therapy: <7%Additional Action Suggested:  >8%     CBG: No results for input(s): GLUCAP in the last 168 hours.  Review of Systems:   See HPI  Past Medical History:  She,  has a past medical history of Anemia (01/18/2016), Arthritis, B12 deficiency (01/18/2016), CAD (coronary artery disease), CVD (cerebrovascular disease), Diverticulosis of colon, H.  pylori infection (10/20/2015), Hyperlipidemia, Hypertension, and Osteoarthritis (03/07/2006).   Surgical History:   Past Surgical History:  Procedure Laterality Date   ABDOMINAL HYSTERECTOMY  1984   REPLACEMENT TOTAL KNEE  2007   left   TOTAL HIP ARTHROPLASTY  2010   Dr. Liam, right   TOTAL KNEE ARTHROPLASTY Right 09/14/2015   Procedure: TOTAL KNEE ARTHROPLASTY;  Surgeon: Marcey Raman, MD;  Location: [facility] OR;  Service: Orthopedics;  Laterality: Right;     Social History:   reports that she quit smoking about 43 years ago. Her smoking use included cigarettes. She started smoking about 73 years ago. She has a 30 pack-year smoking history. She has never used smokeless tobacco. She reports that she does not currently use alcohol. She reports that she does not use drugs.   Family History:  Her family history includes Diabetes in her mother; Heart attack in her brother and sister; Hypertension in her father; Kidney disease in her father. There is no history of Colon cancer, Stomach cancer, Esophageal cancer, or Rectal cancer.    Allergies Allergies  Allergen Reactions   Codeine Nausea And Vomiting and Nausea Only   Hydrocodone Nausea And Vomiting     Home Medications  Prior to Admission medications   Medication Sig Start Date End Date Taking? Authorizing Provider  acetaminophen  (TYLENOL ) 325 MG tablet Take 975 mg by mouth See admin instructions. Take 3 tablets (975 mg) by mouth three times daily, may also take 2 tablets (650 mg) by mouth every 6 hours as needed for moderate pain/headache   Yes [provider]  losartan  (COZAAR ) 100 MG tablet Take 1 tablet (100 mg total) by mouth daily. 06/12/23  Yes Mercer Clotilda SAUNDERS, MD  memantine  (NAMENDA ) 10 MG tablet TAKE 1 TABLET BY MOUTH TWICE DAILY**MUST KEEP UPCOMING APPOINTMENT WITH NP 07/24/2023** Patient taking differently: Take 10 mg by mouth 2 (two) times daily. 08/28/23  Yes Millikan, Megan, NP  metoprolol  tartrate (LOPRESSOR ) 25 MG tablet Take 1 tablet (25 mg total) by mouth 2 (two) times daily. Patient taking differently: Take 25 mg by mouth daily. 06/12/23  Yes Mercer Clotilda SAUNDERS, MD  Multiple Vitamin-Folic Acid  TABS Take 1 tablet by mouth every morning.   Yes [provider]  simvastatin  (ZOCOR ) 40 MG tablet Take 1 tablet (40 mg total) by mouth daily. 06/12/23  Yes Mercer Clotilda SAUNDERS, MD  spironolactone  (ALDACTONE ) 25 MG tablet Take 1 tablet by mouth once daily 06/12/23  Yes Mercer Clotilda SAUNDERS, MD  Blood Pressure Monitoring (BLOOD PRESSURE CUFF) MISC Use as directed for checking bp daily. 09/29/21   Mercer Clotilda SAUNDERS, MD     Critical care time: 55 mins     [facility]   [facility] 02/17/2024, 3:39 AM  Please see [email or web address] for pager details.  From 7A-7P if no response, please call [phone]. After hours, please call ELink [phone].

## 2024-02-17 NOTE — Progress Notes (Signed)
 Patient has no urine output since 7pm, bladder scan showed 163 ml, Dr. Jerrie made aware, continue to monitor per MD.

## 2024-02-17 NOTE — Progress Notes (Signed)
 Initial Nutrition Assessment  DOCUMENTATION CODES:   Not applicable  INTERVENTION:  - Patient NPO. SLP eval pending.  - If diet advanced, recommend Ensure Plus High Protein po BID, each supplement provides 350 kcal and 20 grams of protein.  - If unable to have diet advanced and plan to start tube feeds, recommend: Osmolite 1.5 at 40 ml/h (960 ml per day) *Would recommend starting at 28mL/hr and advance by 10mL Q24H Prosource TF20 60 ml daily Provides 1520 kcal, 80 gm protein, 731 ml free water daily  - Monitor magnesium, potassium, and phosphorus daily for at least 3 days, MD to replete as needed, as pt is at risk for refeeding syndrome given significant weight loss x6 months and reported decreased oral intake PTA.  - Recommend 100mg  thiamine x5 days due to refeeding risk.  - Recommend switching out NGT for Cortrak on Monday, 10/13, if patient fails SLP eval and needs tube feeds to meet nutritional needs.  - Monitor weight trends.   NUTRITION DIAGNOSIS:   Inadequate oral intake related to inability to eat as evidenced by NPO status.  GOAL:   Patient will meet greater than or equal to 90% of their needs  MONITOR:   Diet advancement, Labs, Weight trends  REASON FOR ASSESSMENT:   Consult Assessment of nutrition requirement/status  ASSESSMENT:   89 y.o. female with PMH significant of dementia, HTN, HLD, CAD, left-sided deficits secondary to contractures, and bed bound/wheel-chair bound who presented to PCP office due to decreased responsiveness/hypotension.  Admitted for AMS and hyponatremia.    10/10 Admit 10/11 NGT placed  RD working remotely. Patient noted to have a history of dementia and be admitted with AMS so unable to obtain nutrition history at this time.   Per chart review, patient was weighed at 156# in April and now weighed at 129#. This indicates a possible 27# or 17% weight loss in 6 months, which is significant and severe for the time frame.   Per MD  note today, family had reported the patient was eating 3 meals but not finishing them and that her nutritional status had declined.   Patient is currently NPO and had an NGT placed overnight. Per MD notes, plan for SLP eval and if patient unable to have diet advanced plan to start tube feeds. SLP has been assigned to patient today. Per RN patient has a 34F NGT. Suspect may have difficultly with such large tube in terms of swallowing so would recommend switching out for Cortrak on Monday if still needs tube/TF.  Will provide tube feed recs in event patient unable to have oral diet advanced.   Medications reviewed and include: -  Labs reviewed:  Na 124   NUTRITION - FOCUSED PHYSICAL EXAM:  RD working remotely  Diet Order:   Diet Order             Diet NPO time specified  Diet effective now                   EDUCATION NEEDS:  Not appropriate for education at this time  Skin:  Skin Assessment: Skin Integrity Issues: Skin Integrity Issues:: Stage III, Stage II Stage II: Left Elbow Stage III: Sacrum  Last BM:  PTA  Height:  Ht Readings from Last 1 Encounters:  02/16/24 5' 4 (1.626 m)   Weight:  Wt Readings from Last 1 Encounters:  02/16/24 58.6 kg    BMI:  Body mass index is 22.18 kg/m.  Estimated Nutritional Needs:  Kcal:  1450-1750 kcals Protein:  75-90 grams Fluid:  >/= 1.5L    Trude Ned RD, LDN Contact via Secure Chat.

## 2024-02-17 NOTE — Progress Notes (Signed)
 Patient's daughter [name] made aware of the transfer.

## 2024-02-17 NOTE — Progress Notes (Signed)
 D/w Elink  Working on freeing up ICU bed for 3% administration Appreciate all involved.  Felicia Bonine MD [employer] [phone]

## 2024-02-17 NOTE — Progress Notes (Signed)
 NGT inserted to right nare, pt tolerated well. Abdominal xray to confirm placement ordered.

## 2024-02-17 NOTE — Plan of Care (Addendum)
 Critical Na 119. Spoke with Nephrology on call who suggested initiating 3% Na at 30 mL/hr which will require ICU. Unable to give PO sodium given patient is still altered and NPO status. Discussed transfer with CCM who stated no ICU beds available at the moment and suggested 3% Na bolus of 250 cc so patient can remain on the floor. Talked to nephrology again and would prefer to place NG and administer urena packet. Goal is to raise serum Na 2-3 points. BMP q4hrs.  Felicia Fellers, DO [facility], PGY-1 02/17/2024 1:46 AM

## 2024-02-17 NOTE — Progress Notes (Signed)
 Pt to transfer to 3M02 for initiation of sodium chloride  (hypertonic) 3% solution. Report given to [facility]

## 2024-02-17 NOTE — Progress Notes (Signed)
   02/17/24 0030  Provider Notification  Provider Name/Title Dr. Jerrie  Date Provider Notified 02/17/24  Time Provider Notified 0015  Method of Notification Page  Notification Reason Critical Result  Test performed and critical result Na=119  Date Critical Result Received 02/16/24  Time Critical Result Received 2350  Provider response See new orders (continue to monitor.)  Date of Provider Response 02/17/24  Time of Provider Response [phone]

## 2024-02-17 NOTE — Progress Notes (Signed)
 Na down to 119 Still altered No urine yet Will initiate 3% Na at rate of 30 mL/ hr, no bolus D/w primary- will need transfer to higher level of care (ICU)  Continue Na checks  Almarie Bonine MD [facility] Pgr [phone]

## 2024-02-17 NOTE — Plan of Care (Addendum)
 Seen and examined patient.  No family in the room yet. Consult note from critical care medicine overnight is reviewed. NG was advanced overnight. Spoke with the daughter on phone.  Patient apparently lives with her husband and they have a healthcare agency helping her.  Apparently patient eat 3 meals but has not been eating fully.  Her nutritional status has declined.  She needs assistance with a lot of ADLs.  This is increasing lately. At this stage I recommended palliative care discussion prior to discharge and the daughter is agreeable to having this discussion. Patient had decent urine output and sodium levels went from 121-125 over few hours.  I discussed with nephrology and we decreased the hypertonic saline to 20 cc an hour.  Will continue to monitor. We will have SLP assess swallow.  If she passes we will let her eat.  If not we will consider starting tube feeds.  I talked to the daughter and she understands this and agreeable to this for now. Now I will keep Rocephin going. We shall closely monitor sodium levels.  Tamela Stakes, MD  Attending Physician, Critical Care Medicine [facility] See Amion for pager If no response to pager, please call [phone] until 7pm After 7pm, Please call E-link [phone]

## 2024-02-18 DIAGNOSIS — Z7189 Other specified counseling: Secondary | ICD-10-CM | POA: Diagnosis not present

## 2024-02-18 DIAGNOSIS — Z515 Encounter for palliative care: Secondary | ICD-10-CM | POA: Diagnosis not present

## 2024-02-18 DIAGNOSIS — Z66 Do not resuscitate: Secondary | ICD-10-CM | POA: Diagnosis not present

## 2024-02-18 LAB — COMPREHENSIVE METABOLIC PANEL WITH GFR
ALT: 47 U/L — ABNORMAL HIGH (ref 0–44)
AST: 85 U/L — ABNORMAL HIGH (ref 15–41)
Albumin: 2.4 g/dL — ABNORMAL LOW (ref 3.5–5.0)
Alkaline Phosphatase: 36 U/L — ABNORMAL LOW (ref 38–126)
Anion gap: 15 (ref 5–15)
BUN: 19 mg/dL (ref 8–23)
CO2: 23 mmol/L (ref 22–32)
Calcium: 8.5 mg/dL — ABNORMAL LOW (ref 8.9–10.3)
Chloride: 97 mmol/L — ABNORMAL LOW (ref 98–111)
Creatinine, Ser: 0.7 mg/dL (ref 0.44–1.00)
GFR, Estimated: >60 mL/min (ref >60)
Glucose, Bld: 104 mg/dL — ABNORMAL HIGH (ref 70–99)
Potassium: 3.6 mmol/L (ref 3.5–5.1)
Sodium: 135 mmol/L (ref 135–145)
Total Bilirubin: 0.5 mg/dL (ref 0.0–1.2)
Total Protein: 5.2 g/dL — ABNORMAL LOW (ref 6.5–8.1)

## 2024-02-18 LAB — PHOSPHORUS: Phosphorus: 2.6 mg/dL (ref 2.5–4.6)

## 2024-02-18 LAB — SODIUM: Sodium: 128 mmol/L — ABNORMAL LOW (ref 135–145)

## 2024-02-18 LAB — MAGNESIUM: Magnesium: 1.6 mg/dL — ABNORMAL LOW (ref 1.7–2.4)

## 2024-02-18 MED ORDER — MEMANTINE HCL 10 MG PO TABS
10.0000 mg | ORAL_TABLET | Freq: Two times a day (BID) | ORAL | Status: DC
  Administered 2024-02-18 – 2024-02-22 (×9): 10 mg
  Filled 2024-02-18 (×9): qty 1

## 2024-02-18 MED ORDER — MAGNESIUM SULFATE 4 GM/100ML IV SOLN
4.0000 g | Freq: Once | INTRAVENOUS | Status: AC
  Administered 2024-02-18: 4 g via INTRAVENOUS
  Filled 2024-02-18: qty 100

## 2024-02-18 MED ORDER — SIMVASTATIN 20 MG PO TABS
40.0000 mg | ORAL_TABLET | Freq: Every day | ORAL | Status: DC
  Administered 2024-02-18 – 2024-02-20 (×3): 40 mg
  Filled 2024-02-18 (×3): qty 2

## 2024-02-18 MED ORDER — POTASSIUM & SODIUM PHOSPHATES 280-160-250 MG PO PACK
1.0000 | PACK | Freq: Three times a day (TID) | ORAL | Status: AC
  Administered 2024-02-18 (×3): 1
  Filled 2024-02-18 (×2): qty 1

## 2024-02-18 MED ORDER — METOPROLOL TARTRATE 25 MG PO TABS
25.0000 mg | ORAL_TABLET | Freq: Two times a day (BID) | ORAL | Status: DC
  Administered 2024-02-18 – 2024-02-22 (×9): 25 mg
  Filled 2024-02-18 (×9): qty 1

## 2024-02-18 MED ORDER — POTASSIUM CHLORIDE 20 MEQ PO PACK
40.0000 meq | PACK | Freq: Once | ORAL | Status: AC
  Administered 2024-02-18: 40 meq
  Filled 2024-02-18: qty 2

## 2024-02-18 NOTE — Progress Notes (Signed)
 eLink Physician-Brief Progress Note Patient Name: Felicia Cross DOB: 1932/11/21 MRN: [id number]   Date of Service  02/18/2024  HPI/Events of Note  Hypokalemia and hypomagnesemia Sodium self corrected rapidly to 135  eICU Interventions  Electrolytes replaced No immediate intervention with sodium     Intervention Category Minor Interventions: Electrolytes abnormality - evaluation and management  Myrella Fahs 02/18/2024, 2:03 AM

## 2024-02-18 NOTE — Plan of Care (Signed)
  Problem: Health Behavior/Discharge Planning: Goal: Ability to manage health-related needs will improve Outcome: Progressing   Problem: Clinical Measurements: Goal: Ability to maintain clinical measurements within normal limits will improve Outcome: Progressing Goal: Will remain free from infection Outcome: Progressing Goal: Diagnostic test results will improve Outcome: Progressing Goal: Respiratory complications will improve Outcome: Progressing Goal: Cardiovascular complication will be avoided Outcome: Progressing   Problem: Activity: Goal: Risk for activity intolerance will decrease Outcome: Progressing   Problem: Coping: Goal: Level of anxiety will decrease Outcome: Progressing   Problem: Elimination: Goal: Will not experience complications related to bowel motility Outcome: Progressing Goal: Will not experience complications related to urinary retention Outcome: Progressing   Problem: Pain Managment: Goal: General experience of comfort will improve and/or be controlled Outcome: Progressing   Problem: Safety: Goal: Ability to remain free from injury will improve Outcome: Progressing   Problem: Skin Integrity: Goal: Risk for impaired skin integrity will decrease Outcome: Progressing

## 2024-02-18 NOTE — Progress Notes (Signed)
 [facility] Progress Note   Subjective:  Sodium normal ~midnight last night. No new issues.  No family present this AM when I saw her.  UOP only yesterday, has purewick ? Incomplete collection.  Objective Vitals:   02/18/24 0600 02/18/24 0700 02/18/24 0716 02/18/24 0800  BP: (!) 150/72 136/79  (!) 162/86  Pulse: (!) 102 97 100 (!) 105  Resp: (!) 21 20 (!) 21 (!) 23  Temp:   98.1 F (36.7 C)   TempSrc:   Oral   SpO2: 99% 97% 99% 100%  Weight:      Height:       Physical Exam General: elderly woman sleepy ENT: NG tube in place, MMM Heart: RRR Lungs: clear Abdomen:soft, thin Extremities:no edema, L arm contracture, no edema Neuro: sleepy this AM - murmuring answers to questions  Additional Objective Labs: Basic Metabolic Panel: Recent Labs  Lab 02/16/24 2021 02/16/24 2243 02/17/24 0257 02/17/24 0552 02/17/24 1536 02/17/24 2120 02/17/24 2340  NA 123* 119* 121*   < > 128* 131* 135  K 4.1 4.2 5.0  --   --   --  3.6  CL 91* 90* 88*  --   --   --  97*  CO2 20* 19* 20*  --   --   --  23  GLUCOSE 97 88 76  --   --   --  104*  BUN 17 18 16   --   --   --  19  CREATININE 0.69 0.70 0.66  --   --   --  0.70  CALCIUM 8.4* 8.2* 8.6*  --   --   --  8.5*  PHOS 2.9 2.9  --   --   --   --  2.6   < > = values in this interval not displayed.   Liver Function Tests: Recent Labs  Lab 02/16/24 1109 02/16/24 2021 02/16/24 2243 02/17/24 2340  AST 109*  --   --  85*  ALT 39  --   --  47*  ALKPHOS 43  --   --  36*  BILITOT 0.9  --   --  0.5  PROT 6.0*  --   --  5.2*  ALBUMIN 3.1* 2.5* 2.4* 2.4*   No results for input(s): LIPASE, AMYLASE in the last 168 hours. CBC: Recent Labs  Lab 02/16/24 1109 02/16/24 1200 02/17/24 0552 02/17/24 2340  WBC 13.7*  --  11.0* 10.6*  NEUTROABS 11.6*  --   --   --   HGB 12.0 13.3 11.5* 10.0*  HCT 35.2* 39.0 33.6* 30.0*  MCV 94.1  --  94.9 95.2  PLT 267  --  241 240   Blood Culture    Component Value Date/Time    SDES BLOOD LEFT HAND 02/16/2024 2020   SPECREQUEST  02/16/2024 2020    BOTTLES DRAWN AEROBIC ONLY Blood Culture adequate volume   CULT  02/16/2024 2020    NO GROWTH 2 DAYS Performed at [facility], [address], [place], [place] [address]    REPTSTATUS PENDING 02/16/2024 2020    Cardiac Enzymes: No results for input(s): CKTOTAL, CKMB, CKMBINDEX, TROPONINI in the last 168 hours. CBG: Recent Labs  Lab 02/17/24 0331 02/17/24 1126  GLUCAP 80 89   Iron Studies: No results for input(s): IRON, TIBC, TRANSFERRIN, FERRITIN in the last 72 hours. @lablastinr3 @ Studies/Results: DG Abd 1 View Result Date: 02/17/2024 EXAM: 1 VIEW XRAY OF THE ABDOMEN 02/17/2024 03:58:00 AM COMPARISON: CT scout  view 02/16/2024. Portable xray 0216 hours today. CLINICAL HISTORY: 89 year old female. Encounter for nasogastric (NG) tube placement. FINDINGS: LINES, TUBES AND DEVICES: Enteric tube has been advanced, loops at the level of the stomach now. BOWEL: Nonobstructive bowel gas pattern. Bowel gas pattern appears mildly improved from the CT scout view yesterday. SOFT TISSUES: No opaque urinary calculi. BONES: No acute osseous abnormality. IMPRESSION: 1. Satisfactory Enteric tube placement in the stomach now. Electronically signed by: Helayne Hurst MD 02/17/2024 04:07 AM EDT RP Workstation: [id number]   DG Abd 1 View Result Date: 02/17/2024 CLINICAL DATA:  Check gastric catheter placement EXAM: ABDOMEN - 1 VIEW COMPARISON:  None Available. FINDINGS: Gastric catheter is noted in the stomach. Proximal side port however lies in the distal esophagus. This should be advanced deeper into the stomach. Scattered large and small bowel gas is seen. No free air is noted. IMPRESSION: Gastric catheter as described. This should be advanced deeper into the stomach. Electronically Signed   By: Oneil Devonshire M.D.   On: 02/17/2024 02:49   MR BRAIN WO CONTRAST Result Date: 02/16/2024 EXAM: MR Brain without  Intravenous Contrast. CLINICAL HISTORY: Weakness. TECHNIQUE: Magnetic resonance images of the brain without intravenous contrast in multiple planes. CONTRAST: Without. COMPARISON: MRI head October 08, 2021 FINDINGS: BRAIN: No restricted diffusion to indicate acute infarction. No intracranial mass or hemorrhage. No midline shift or extra-axial fluid collection. The central arterial and venous flow voids are patent. Patchy FLAIR signal abnormality throughout the subcortical and periventricular white matter likely the sequela of mild chronic white matter microangiopathy. Cerebral atrophy. VENTRICLES: No hydrocephalus. ORBITS: The orbits are normal. SINUSES AND MASTOIDS: The sinuses and mastoid air cells are clear. BONES: No acute fracture or focal osseous lesion. IMPRESSION: 1. Unremarkable brain MRI. Electronically signed by: Gilmore Molt MD 02/16/2024 06:27 PM EDT RP Workstation: [id number]   US  EKG SITE RITE Result Date: 02/16/2024 If Site Rite image not attached, placement could not be confirmed due to current cardiac rhythm.  CT HEAD WO CONTRAST Result Date: 02/16/2024 CLINICAL DATA:  Fatigue and hypotension. EXAM: CT HEAD WITHOUT CONTRAST TECHNIQUE: Contiguous axial images were obtained from the base of the skull through the vertex without intravenous contrast. RADIATION DOSE REDUCTION: This exam was performed according to the departmental dose-optimization program which includes automated exposure control, adjustment of the mA and/or kV according to patient size and/or use of iterative reconstruction technique. COMPARISON:  November 23, 2022 FINDINGS: Brain: There is generalized cerebral atrophy with widening of the extra-axial spaces and ventricular dilatation. There are areas of decreased attenuation within the white matter tracts of the supratentorial brain, consistent with microvascular disease changes. Normal basal ganglia. Normal bilateral thalami. Vascular: Moderate severity bilateral cavus carotid  artery calcification is noted. Skull: Normal. Negative for fracture or focal lesion. Sinuses/Orbits: No acute finding. Other: None. IMPRESSION: 1. Generalized cerebral atrophy and microvascular disease changes of the supratentorial brain. 2. No acute intracranial abnormality. Electronically Signed   By: Suzen Dials M.D.   On: 02/16/2024 16:06   CT ABDOMEN PELVIS W CONTRAST Result Date: 02/16/2024 EXAM: CT ABDOMEN AND PELVIS WITH CONTRAST 02/16/2024 01:33:00 PM TECHNIQUE: CT of the abdomen and pelvis was performed with the administration of 75 mL of iohexol (OMNIPAQUE) 350 MG/ML injection. Multiplanar reformatted images are provided for review. Automated exposure control, iterative reconstruction, and/or weight-based adjustment of the mA/kV was utilized to reduce the radiation dose to as low as reasonably achievable. COMPARISON: None available. CLINICAL HISTORY: Sepsis. 75ml omni 350 IV. PT BIB [facility]  from PCP (initial went to pcp d/t sacral wound) d/t Lethargy noticed by provider. Upon ems arrival pt was responsive to to voice and hypotensive 70s. FINDINGS: LOWER CHEST: 6 mm nodule seen in right middle lobe. LIVER: The liver is unremarkable. GALLBLADDER AND BILE DUCTS: Gallbladder is unremarkable. No biliary ductal dilatation. SPLEEN: No acute abnormality. PANCREAS: No acute abnormality. ADRENAL GLANDS: No acute abnormality. KIDNEYS, URETERS AND BLADDER: Simple left renal cyst is noted. Per consensus, no follow-up is needed for simple Bosniak type 1 and 2 renal cysts, unless the patient has a malignancy history or risk factors. No stones in the kidneys or ureters. No hydronephrosis. No perinephric or periureteral stranding. Urinary bladder is unremarkable. GI AND BOWEL: Stomach demonstrates no acute abnormality. There is no bowel obstruction. PERITONEUM AND RETROPERITONEUM: No ascites. No free air. VASCULATURE: Atherosclerosis of abdominal aorta is noted. LYMPH NODES: No lymphadenopathy. REPRODUCTIVE  ORGANS: No acute abnormality. BONES AND SOFT TISSUES: Status post right total hip arthroplasty. No acute osseous abnormality. No focal soft tissue abnormality. IMPRESSION: 1. Incidental 6 mm right middle lobe pulmonary nodule. Recommend non-contrast chest CT in 612 months; if stable, consider follow-up at 1824 months based on risk. 2. Simple left renal cyst. 3. Status post right total hip arthroplasty. 4. Atherosclerosis of the abdominal aorta. Electronically signed by: Lynwood Seip MD 02/16/2024 02:02 PM EDT RP Workstation: [id number]   DG Chest 2 View Result Date: 02/16/2024 EXAM: 2 VIEW(S) XRAY OF THE CHEST 02/16/2024 11:56:00 AM COMPARISON: 07/23/2021 CLINICAL HISTORY: near syncope. Reason for exam: near syncope; Best images obtainable due to patient limited mobility/constriction near syncope FINDINGS: LUNGS AND PLEURA: Hypoinflation of the lungs is noted. No focal pulmonary opacity. No pulmonary edema. No pleural effusion. No pneumothorax. HEART AND MEDIASTINUM: No acute abnormality of the cardiac and mediastinal silhouettes. BONES AND SOFT TISSUES: Degenerative changes are seen involving both shoulders. IMPRESSION: 1. Hypoinflation of the lungs. 2. No acute cardiopulmonary process. Electronically signed by: Lynwood Seip MD 02/16/2024 12:20 PM EDT RP Workstation: [id number]   Medications:  cefTRIAXone (ROCEPHIN)  IV Stopped (02/17/24 1944)   feeding supplement (OSMOLITE 1.5 CAL)     sodium chloride  (hypertonic) Stopped (02/17/24 1824)    Chlorhexidine  Gluconate Cloth  6 each Topical Daily   enoxaparin  (LOVENOX ) injection  40 mg Subcutaneous Q24H   feeding supplement (PROSource TF20)  60 mL Per Tube Daily   leptospermum manuka honey  1 Application Topical Daily   memantine   10 mg Per Tube BID   metoprolol  tartrate  25 mg Per Tube BID   simvastatin   40 mg Per Tube q1800    Assessment/Plan Felicia Cross is an 89 y.o. female with anemia, CAD, HTN, HL, OA being admitted for weakness and AMS  found to have hyponatremia for which nephrology is consulted.    **Hyponatremia:  acute on chronic.  Initially appeared hypovolemic in setting of poor po intake and urine osm 587, urine sodium < 30 would support poor po intake.  Sodium did not improve substantially with isotonic volume and remained AMS overnight so ultimately required 3% for several hours 10/11, off as of 6:30pm yesterday and sodium has now normalized after additional isotonic volume, tube feeds.     **AMS: suspect multifactorial with hypotension, hyponatremia, possible infection being ruled out.  Improving now.     **HTN: holding meds - BP somewhat labile, avoid hypotension so will just watch for now.  **Oliguria:  UOP documented 100, suspect incomplete collection.  Has PVRs ordered q shift - continue. 10/10 CT  showed no urinary obstruction.  Cr remains normal at 0.7 which is her baseline.     D/W PCCM this AM.  Nephrology has nothing further to add - will sign off.    Manuelita Barters MD 02/18/2024, 8:45 AM  [facility] Pager: [phone]

## 2024-02-18 NOTE — Consult Note (Signed)
 Palliative Medicine Inpatient Consult Note  Consulting Provider: Claudene Fonda BROCKS, NP   Reason for consult:   Palliative Care Consult Services Palliative Medicine Consult  Reason for Consult? Continue GOC, advancing dementia   02/18/2024  HPI:  Per intake H&P --> 89 year old with a history of dementia, metabolic syndrome, left-sided deficits from contractures who presents with decreased responsiveness and hypotension found to have hyponatremia. Palliative care has been asked to support additional goals of care conversations in the setting of dementia.   Clinical Assessment/Goals of Care:  *Please note that this is a verbal dictation therefore any spelling or grammatical errors are due to the Dragon Medical One system interpretation.  I have reviewed medical records including EPIC notes, labs and imaging, received report from bedside RN, assessed the patient who is awake and alert to person and place.    I called and spoke with patient's daughter, Felicia Cross to further discuss diagnosis prognosis, GOC, EOL wishes, disposition and options.   I introduced Palliative Medicine as specialized medical care for people living with serious illness. It focuses on providing relief from the symptoms and stress of a serious illness. The goal is to improve quality of life for both the patient and the family.  Medical History Review and Understanding:  A review of Felicia Cross's past medical history inclusive of coronary artery disease, hypertension, hyperlipidemia, and dementia was completed.  Social History:  Felicia Cross is from [place]  originally but moved to [place]  for her husband. Felicia Cross has been married to her husband, Felicia Cross for over 60 years.  She shares that she has 4 children.  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  She is a woman of faith practicing within the [facility] denomination.  Functional and Nutritional State:  Prior to hospitalization, Felicia Cross lived in a single-family home with her  husband.  She is a member of [employer].  She has a caregiver come in 7 days a week to help her with B ADLs.  They are able to assist with dressing and bathing her.  They also help with feeding her.  Twice a week Felicia Cross goes to the [facility] for social[facility].  Prior to admission she had been eating quite well per her family  Advance Directives:  A detailed discussion was had today regarding advanced directives.  Felicia Cross does not have a formal living will however her decision maker if she is unable to make decisions for herself would be her husband, Felicia Cross.  Code Status:  Concepts specific to code status, artifical feeding and hydration, continued IV antibiotics and rehospitalization was had.  The difference between a aggressive medical intervention path  and a palliative comfort care path for this patient at this time was had.   Felicia Cross is an established DO NOT RESUSCITATE DO NOT INTUBATE CODE STATUS.  Discussion:  The circumstances surrounding hospitalization were reviewed inclusive of Felicia Cross having for the last week generalized weakness.  On the day of admission she was noted to have a decline in her level of responsiveness.  She had been seen by her primary care physician and the determination was made for her to be hospitalized.  She was noted to be hyponatremic, hypoalbuminemic, hyperkalemic, and hypomagnesia.   A discussion related to Felicia Cross's dementia and its severity was held.  We reviewed that it had become more marked over the past few years but from a mentation standpoint Felicia Cross has been coherent and able to carry on conversations.  I shared openly and honestly that an acute hospitalization and/or illness  can cause further deterioration of patient's mental acuity, physical function, and generalized wellness when they are plagued by dementia.  Since hospitalization, Felicia Cross's labs have slowly been corrected.  She is more awake and alert.  She has had a nasogastric tube placed  for nutritional management .  I asked patient's daughter, Felicia Cross if they would ever want long-term artificial nutrition.  I shared health care professionals commonly rely on feeding tubes to supply nutrition to these severely demented patients. However, various studies have not shown use of feeding tubes to be effective in preventing malnutrition. Furthermore, they have not been demonstrated to prevent the occurrence or increase the healing of pressure sores, prevent aspiration pneumonia, provide comfort, improve functional status, or extend life. High complication rates, increased use of restraints, and other adverse effects further increase the burden of feeding tubes in severely demented patients. Felicia Cross notes if there was for something which would be needed it would be a conversation with she and her father.  At this point in time the hope is for speech therapy to work with Felicia Cross and for further decisions to be made based upon patient's improvements and/or declines.  We discussed the goal from the perspective of Felicia Cross's family is for her to get back to her baseline which was total care.  The hope is also for improvement of Felicia Cross's sacral wound which has inhibited mobility in her wheelchair and recliner chair due to pressure pain.  Discussed the importance of continued conversation with family and their  medical providers regarding overall plan of care and treatment options, ensuring decisions are within the context of the patients values and GOCs.  Decision Maker: Felicia Cross, Felicia Cross (Spouse): [phone] [facility] Phone)   SUMMARY OF RECOMMENDATIONS   DNAR/DNI  Treat the treatable - allowing time for outcomes  Patient's family would ideally like Felicia Cross to be able to eat and drink on her own again - information shared about long-term feeding tubes and dementia patients and the lack of supportive evidence associated with this  Plan to call and update Felicia Cross's [facility] MS[facility] - Felicia Cross tomorrow  Ongoing  palliative care support  Code Status/Advance Care Planning: DNAR/DNI  Palliative Prophylaxis:  Aspiration, Bowel Regimen, Delirium Protocol, Frequent Pain Assessment, Oral Care, Palliative Wound Care, and Turn Reposition  Additional Recommendations (Limitations, Scope, Preferences): Continue current care  Psycho-social/Spiritual:  Desire for further Chaplaincy support: Yes-[employer] Additional Recommendations: Education on dementia and progression of disease   Prognosis: Limited in the setting of multiple chronic comorbidities, malnourishment, new sacral ulcer, and generalized frailty/dependence.  Patient has a high 93-month mortality risk  Discharge Planning: Discharge plan to be determined patient works with [employer]  Vitals:   02/18/24 0318 02/18/24 0400  BP:  (!) 154/76  Pulse:  (!) 112  Resp:  (!) 22  Temp: 98.7 F (37.1 C)   SpO2:  99%    Intake/Output Summary (Last 24 hours) at 02/18/2024 0708 Last data filed at 02/18/2024 0450 Gross per 24 hour  Intake 954.48 ml  Output 100 ml  Net 854.48 ml   Last Weight  Most recent update: 02/16/2024  6:24 PM    Weight  58.6 kg (129 lb 3 oz)             LABS: CBC:    Component Value Date/Time   WBC 10.6 (H) 02/17/2024 2340   HGB 10.0 (L) 02/17/2024 2340   HCT 30.0 (L) 02/17/2024 2340   PLT 240 02/17/2024 2340   MCV 95.2 02/17/2024 2340   NEUTROABS  11.6 (H) 02/16/2024 1109   LYMPHSABS 0.8 02/16/2024 1109   MONOABS 1.2 (H) 02/16/2024 1109   EOSABS 0.0 02/16/2024 1109   BASOSABS 0.0 02/16/2024 1109   Comprehensive Metabolic Panel:    Component Value Date/Time   NA 135 02/17/2024 2340   NA 129 (L) 12/01/2021 1419   K 3.6 02/17/2024 2340   CL 97 (L) 02/17/2024 2340   CO2 23 02/17/2024 2340   BUN 19 02/17/2024 2340   BUN 12 12/01/2021 1419   CREATININE 0.70 02/17/2024 2340   CREATININE 0.90 11/05/2021 1513   GLUCOSE 104 (H) 02/17/2024 2340   CALCIUM 8.5 (L) 02/17/2024 2340   AST 85 (H) 02/17/2024  2340   ALT 47 (H) 02/17/2024 2340   ALKPHOS 36 (L) 02/17/2024 2340   BILITOT 0.5 02/17/2024 2340   BILITOT 0.3 11/05/2018 1233   PROT 5.2 (L) 02/17/2024 2340   PROT 6.5 11/05/2018 1233   ALBUMIN 2.4 (L) 02/17/2024 2340   ALBUMIN 4.1 11/05/2018 1233    Gen: Elderly African-American female chronically ill in appearance HEENT: Nasogastric tube in place, dry mucous membranes CV: Irregular rate and rhythm PULM: On room air breathing is even and nonlabored ABD: soft/nontender EXT: Bilateral hand contractures Neuro: Alert and oriented x2  PPS: 20%   This conversation/these recommendations were discussed with patient primary care team, Dr. Gatha ______________________________________________________ [name] [facility] Palliative Medicine Team Team Cell Phone: [phone] Please utilize secure chat with additional questions, if there is no response within 30 minutes please call the above phone number  Total Time: 75 Billing based on MDM: High  Palliative Medicine Team providers are available by phone from 7am to 7pm daily and can be reached through the team cell phone.  Should this patient require assistance outside of these hours, please call the patient's attending physician.

## 2024-02-18 NOTE — Progress Notes (Addendum)
 Speech Language Pathology Treatment: Dysphagia  Patient Details Name: Felicia Cross MRN: [id number] DOB: 06-12-1932 Today's Date: 02/18/2024 Time: 8686-8671 SLP Time Calculation (min) (ACUTE ONLY): 15 min  Assessment / Plan / Recommendation Clinical Impression  Pt was seen for dysphagia treatment after contacted by RN regarding pt exhibiting coughing and throat clearing after having mashed potatoes and a piece of green bean. Pt tolerated thin liquids via straw and a single bolus of puree (i.e., mashed potatoes) without overt s/s of aspiration. However, she exhibited coughing after the second bolus of mashed potatoes and delayed coughing thereafter. After approximately 10 minutes, a wet vocal quality was noted with throat clearing and vocalization. SLP questions the possibility of pharyngeal residue and/or the presence of the large bore NGT possibly contributing to laryngeal invasion. Pt has been made NPO by RN after being symptomatic of aspiration with with lunch. SLP is in agreement with this plan, but with allowance of small sips of water after oral care. Referring MD advised that he would like to continue enteral nutrition at this time; Dr. Gatha and RN agreed that pt can be switched to Cortrak tomorrow instead the large-bore NGT. SLP will follow pt to re-assess swallow function after change to Cortrak and determine need for instrumental assessment thereafter.    HPI HPI: Pt is an 89 y.o. female who presented with decreased responsiveness and hypotension at her PCPs office in the setting of one day of decreased responsiveness and lower speaking volume at home with 2-week history of subtly worsening weakness. MRI 10/10 negative for acute changes. CXR 10/10: No acute cardiopulmonary process. PMH: CAD, dementia, HTN, HLD. MBS 01/02/24: normal oropharyngeal swallow. There was occasional oral holding but adequate mastication and propulsion of material into pharynx. There was intermittent, transient  penetration of thin liquids into the upper larynx which was reliably ejected from the larynx upon completion of the swallow (PAS 2, WFL). Her cough (reported to occur during meals by daughter), if occurring during aspiration, appears to be protective.      SLP Plan  Continue with current plan of care          Recommendations  Diet recommendations: NPO Medication Administration: Via alternative means Compensations: Slow rate;Small sips/bites                  Oral care BID;Oral care prior to ice chip/H20     Dysphagia, unspecified (R13.10)     Continue with current plan of care    Felicia Cross I. [place], MS, CCC-SLP Acute Rehabilitation Services Office number [phone]  Felicia Cross  02/18/2024, 1:32 PM

## 2024-02-18 NOTE — Progress Notes (Signed)
 Pt with occasional weak cough and throat clearing before receiving dysphasia 3 tray. Coughing and throat clearing worsening with only a few bites. Pt continuously trying to clear throat and cough weakly afterwards. Doesn't appear to manage swallowing mashed potatoes and gravy. Will keep NPO and appraise ST

## 2024-02-18 NOTE — Progress Notes (Signed)
 NAME:  Felicia Cross, MRN:  [id number], DOB:  [birth year], LOS: 2 ADMISSION DATE:  02/16/2024, CONSULTATION DATE:  02/17/24 REFERRING MD: Jerrie CHIEF COMPLAINT:  Weakness   History of Present Illness:  Patient is a 89 year old female with significant medical history including dementia, hypertension, HLD, CAD, left-sided deficits secondary to contractures, and bed bound/wheel-chair bound who presented to PCP office due to decreased responsiveness/hypotension.  Per report, patient lives with daughter (who is caretaker) had approximately 2-week history of worsening weakness.  Due to hypotension being in the 70s-SBP at PCP office, patient was transported to [facility] ED by EMS.  Upon initial workup, noted that patient hyponatremic-with sodium of 121, WBC 13.7, and lactic acid of 2.6.  Patient was admitted under family medicine service for altered mental status/hyponatremia and nephrology was consulted for hyponatremia.  Patient received 1 L of normal saline and Rocephin.  Lactic acid down trended to 1.8. Initially Na increased to 123 after saline bolus but downtrended to 119.  Nephrology requesting for initiation of 3% saline and frequent neurochecks and ICU.  PCCM consulted for ICU management.  Pertinent  Medical History   Past Medical History:  Diagnosis Date   Anemia 01/18/2016   -reports on and off her whole life, on oral iron in the past remotely per report   Arthritis    B12 deficiency 01/18/2016   CAD (coronary artery disease)    CVD (cerebrovascular disease)    Diverticulosis of colon    H. pylori infection 10/20/2015   Hyperlipidemia    Hypertension    Osteoarthritis 03/07/2006   Qualifier: Diagnosis of  By: Tammie MD, [facility] Events: Including procedures, antibiotic start and stop dates in addition to other pertinent events   10/10 admitted by family medicine for hyponatremia/altered mental status 10/11 PCCM consult for transfer to initiate 3%  saline 02/18/2024 patient has been off of hypertonic saline for 12 hours she is transferred out of ICU  Interim History / Subjective:  Patient's sodium got corrected.  It is in 130s.  She is much more alert.  She is being assessed for swallow and doing better.  Tube feeds started yesterday.  Hypertonic saline has been off since yesterday.  Confusion is improving.  Objective    Blood pressure 131/65, pulse 93, temperature 98.1 F (36.7 C), temperature source Oral, resp. rate (!) 22, height 5' 4 (1.626 m), weight 58.6 kg, SpO2 96%.        Intake/Output Summary (Last 24 hours) at 02/18/2024 1111 Last data filed at 02/18/2024 1100 Gross per 24 hour  Intake 1187.79 ml  Output 100 ml  Net 1087.79 ml   Filed Weights   02/16/24 1122 02/16/24 1822 02/18/24 0700  Weight: 70 kg 58.6 kg 58.6 kg    Examination: General: Chronically deconditioned elderly woman lying in bed in no distress HEENT: Normocephalic, both pupils equal and reacting to light missing teeth  CV: S1-S2 heard no S3 no murmur pulm: clear, diminished, no distress  Abs: bs active, soft  Extremities: no edema, no deformity, left upper extremity contractures, moves extremities/weak  Skin: no rash, pressure injury  Neuro: Rass 0, alert to self, place, disoriented to time/situation  GU: purewick     Resolved problem list   Assessment and Plan  Hyponatremia -hypovolemic due to poor solute intake Hypomagnesemia  P: Off hypertonic saline since last night Seizure precautions, Check sodium again this afternoon Monitor input output Appreciate nephrology input Speech therapy evaluating for swallow  if she passes she will be allowed diet Supplemental tube feeds to go on for now  Acute metabolic encephalopathy -improving Altered mental status-improving Dementia Suspect hyponatremia with advancing dementia +/-infectious etiology-negative for pneumonia, negative for UTI Blood cultures from 02/16/2024 no growth to  date -CT scan of head showing cerebral atrophy/microvascular disease, negative for acute intracranial abnormality.  MRI negative for CVA. P: Limit sedating medications Initiate delirium precautions as well as aspiration precautions Continue to treat hyponatremia as above Goals of care below Received rocephin-Pro-Cal is 0.7 commit to 5 days of Rocephin Wound consult   HTN HLD CAD P: Hold oral antihypertensives/statin for now  -okay to restart as able Hydralazine  prn for SBP >160  Continue lovenox  for DVT ppx   Malnutrition P: Speech therapy to evaluate swallow Continue tube feeds for now  Sacral wound/pressure injury P: Wound care consult  Goals of care Appreciate palliative care input  DNR limited CODE STATUS-which is appropriate   Okay to transfer out of ICU.  TRH to pick up tomorrow.   Labs   CBC: Recent Labs  Lab 02/16/24 1109 02/16/24 1200 02/17/24 0552 02/17/24 2340  WBC 13.7*  --  11.0* 10.6*  NEUTROABS 11.6*  --   --   --   HGB 12.0 13.3 11.5* 10.0*  HCT 35.2* 39.0 33.6* 30.0*  MCV 94.1  --  94.9 95.2  PLT 267  --  241 240    Basic Metabolic Panel: Recent Labs  Lab 02/16/24 2020 02/16/24 2021 02/16/24 2243 02/17/24 0257 02/17/24 0552 02/17/24 1034 02/17/24 1536 02/17/24 2120 02/17/24 2340  NA 123* 123* 119* 121* 125* 124* 128* 131* 135  K 4.0 4.1 4.2 5.0  --   --   --   --  3.6  CL 89* 91* 90* 88*  --   --   --   --  97*  CO2 21* 20* 19* 20*  --   --   --   --  23  GLUCOSE 100* 97 88 76  --   --   --   --  104*  BUN 17 17 18 16   --   --   --   --  19  CREATININE 0.71 0.69 0.70 0.66  --   --   --   --  0.70  CALCIUM 8.5* 8.4* 8.2* 8.6*  --   --   --   --  8.5*  MG  --   --   --   --   --   --   --   --  1.6*  PHOS  --  2.9 2.9  --   --   --   --   --  2.6   GFR: Estimated Creatinine Clearance: 42 mL/min (by C-G formula based on SCr of 0.7 mg/dL). Recent Labs  Lab 02/16/24 1109 02/16/24 1200 02/16/24 1432 02/17/24 0552  02/17/24 2340  PROCALCITON  --   --   --  0.79  --   WBC 13.7*  --   --  11.0* 10.6*  LATICACIDVEN  --  2.6* 1.8  --   --     Liver Function Tests: Recent Labs  Lab 02/16/24 1109 02/16/24 2021 02/16/24 2243 02/17/24 2340  AST 109*  --   --  85*  ALT 39  --   --  47*  ALKPHOS 43  --   --  36*  BILITOT 0.9  --   --  0.5  PROT 6.0*  --   --  5.2*  ALBUMIN 3.1* 2.5* 2.4* 2.4*   No results for input(s): LIPASE, AMYLASE in the last 168 hours. No results for input(s): AMMONIA in the last 168 hours.  ABG    Component Value Date/Time   TCO2 25 02/16/2024 1200     Coagulation Profile: No results for input(s): INR, PROTIME in the last 168 hours.  Cardiac Enzymes: No results for input(s): CKTOTAL, CKMB, CKMBINDEX, TROPONINI in the last 168 hours.  HbA1C: Hgb A1c MFr Bld  Date/Time Value Ref Range Status  12/18/2020 10:42 AM 5.9 4.6 - 6.5 % Final    Comment:    Glycemic Control Guidelines for People with Diabetes:Non Diabetic:  <6%Goal of Therapy: <7%Additional Action Suggested:  >8%   10/21/2019 12:10 PM 6.0 4.6 - 6.5 % Final    Comment:    Glycemic Control Guidelines for People with Diabetes:Non Diabetic:  <6%Goal of Therapy: <7%Additional Action Suggested:  >8%     CBG: Recent Labs  Lab 02/17/24 0331 02/17/24 1126  GLUCAP 80 89    Review of Systems:   See HPI  Past Medical History:  She,  has a past medical history of Anemia (01/18/2016), Arthritis, B12 deficiency (01/18/2016), CAD (coronary artery disease), CVD (cerebrovascular disease), Diverticulosis of colon, H. pylori infection (10/20/2015), Hyperlipidemia, Hypertension, and Osteoarthritis (03/07/2006).   Surgical History:   Past Surgical History:  Procedure Laterality Date   ABDOMINAL HYSTERECTOMY  1984   REPLACEMENT TOTAL KNEE  2007   left   TOTAL HIP ARTHROPLASTY  2010   Dr. Liam, right   TOTAL KNEE ARTHROPLASTY Right 09/14/2015   Procedure: TOTAL KNEE ARTHROPLASTY;  Surgeon: Marcey Raman, MD;  Location: [facility] OR;  Service: Orthopedics;  Laterality: Right;     Social History:   reports that she quit smoking about 43 years ago. Her smoking use included cigarettes. She started smoking about 73 years ago. She has a 30 pack-year smoking history. She has never used smokeless tobacco. She reports that she does not currently use alcohol. She reports that she does not use drugs.   Family History:  Her family history includes Diabetes in her mother; Heart attack in her brother and sister; Hypertension in her father; Kidney disease in her father. There is no history of Colon cancer, Stomach cancer, Esophageal cancer, or Rectal cancer.   Allergies Allergies  Allergen Reactions   Codeine Nausea And Vomiting and Nausea Only   Hydrocodone Nausea And Vomiting     Home Medications  Prior to Admission medications   Medication Sig Start Date End Date Taking? Authorizing Provider  acetaminophen  (TYLENOL ) 325 MG tablet Take 975 mg by mouth See admin instructions. Take 3 tablets (975 mg) by mouth three times daily, may also take 2 tablets (650 mg) by mouth every 6 hours as needed for moderate pain/headache   Yes [provider]  losartan  (COZAAR ) 100 MG tablet Take 1 tablet (100 mg total) by mouth daily. 06/12/23  Yes Mercer Clotilda SAUNDERS, MD  memantine  (NAMENDA ) 10 MG tablet TAKE 1 TABLET BY MOUTH TWICE DAILY**MUST KEEP UPCOMING APPOINTMENT WITH NP 07/24/2023** Patient taking differently: Take 10 mg by mouth 2 (two) times daily. 08/28/23  Yes Millikan, Megan, NP  metoprolol  tartrate (LOPRESSOR ) 25 MG tablet Take 1 tablet (25 mg total) by mouth 2 (two) times daily. Patient taking differently: Take 25 mg by mouth daily. 06/12/23  Yes Mercer Clotilda SAUNDERS, MD  Multiple Vitamin-Folic Acid  TABS Take 1 tablet by mouth every morning.   Yes [provider]  simvastatin  (  ZOCOR ) 40 MG tablet Take 1 tablet (40 mg total) by mouth daily. 06/12/23  Yes Mercer Clotilda SAUNDERS, MD  spironolactone  (ALDACTONE )  25 MG tablet Take 1 tablet by mouth once daily 06/12/23  Yes Mercer Clotilda SAUNDERS, MD  Blood Pressure Monitoring (BLOOD PRESSURE CUFF) MISC Use as directed for checking bp daily. 09/29/21   Mercer Clotilda SAUNDERS, MD     Critical care time: 44 mins     Tamela Stakes, MD  Attending Physician, Critical Care Medicine [facility] See Amion for pager If no response to pager, please call [phone] until 7pm After 7pm, Please call E-link [phone]

## 2024-02-18 NOTE — Evaluation (Addendum)
 Clinical/Bedside Swallow Evaluation Patient Details  Name: Felicia Cross MRN: [id number] Date of Birth: [birth year]  Today's Date: 02/18/2024 Time: SLP Start Time (ACUTE ONLY): 1102 SLP Stop Time (ACUTE ONLY): 1119 SLP Time Calculation (min) (ACUTE ONLY): 17 min  Past Medical History:  Past Medical History:  Diagnosis Date   Anemia 01/18/2016   -reports on and off her whole life, on oral iron in the past remotely per report   Arthritis    B12 deficiency 01/18/2016   CAD (coronary artery disease)    CVD (cerebrovascular disease)    Diverticulosis of colon    H. pylori infection 10/20/2015   Hyperlipidemia    Hypertension    Osteoarthritis 03/07/2006   Qualifier: Diagnosis of  By: Tammie MD, Bruce     Past Surgical History:  Past Surgical History:  Procedure Laterality Date   ABDOMINAL HYSTERECTOMY  1984   REPLACEMENT TOTAL KNEE  2007   left   TOTAL HIP ARTHROPLASTY  2010   Dr. Liam, right   TOTAL KNEE ARTHROPLASTY Right 09/14/2015   Procedure: TOTAL KNEE ARTHROPLASTY;  Surgeon: Marcey Raman, MD;  Location: [facility] OR;  Service: Orthopedics;  Laterality: Right;   HPI:  Pt is an 89 y.o. female who presented with decreased responsiveness and hypotension at her PCPs office in the setting of one day of decreased responsiveness and lower speaking volume at home with 2-week history of subtly worsening weakness. MRI 10/10 negative for acute changes. CXR 10/10: No acute cardiopulmonary process. PMH: CAD, dementia, HTN, HLD. MBS 01/02/24: normal oropharyngeal swallow. There was occasional oral holding but adequate mastication and propulsion of material into pharynx. There was intermittent, transient penetration of thin liquids into the upper larynx which was reliably ejected from the larynx upon completion of the swallow (PAS 2, WFL). Her cough (reported to occur during meals by daughter), if occurring during aspiration, appears to be protective.    Assessment / Plan / Recommendation  Clinical  Impression  Pt was seen for bedside swallow evaluation. She stated that she enjoys oatmeal and grits, but did not provide further insight regarding her diet. Pt has dementia and oral mechanism exam was limited due to pt's difficulty following commands; however, oral motor strength and ROM appeared grossly WFL. Dentition was reduced with some absent molars. She exhibited coughing twice with regular texture solids, but not with dysphagia 3 or puree solids or liquids. This behavior was described by pt's daughter in August, but based on pt's performance on the MBS during that visit, pt's coughing was judged to be protective. Mild oral holding observed today was consistent with pt's performance on 01/02/24. A dysphagia 3 diet with thin liquids is recommended at this time with observance of swallowing precautions and SLP will follow to ensure tolerance. SLP Visit Diagnosis: Dysphagia, unspecified (R13.10)    Aspiration Risk  Mild aspiration risk    Diet Recommendation Dysphagia 3 (Mech soft);Thin liquid    Liquid Administration via: Cup;Straw Medication Administration: Whole meds with puree Supervision: Staff to assist with self feeding Compensations: Slow rate;Small sips/bites Postural Changes: Seated upright at 90 degrees    Other  Recommendations Oral Care Recommendations: Oral care BID     Assistance Recommended at Discharge    Functional Status Assessment Patient has not had a recent decline in their functional status  Frequency and Duration min 2x/week  2 weeks       Prognosis Barriers to Reach Goals: Cognitive deficits      Swallow Study   General Date of  Onset: 02/17/24 HPI: Pt is an 89 y.o. female who presented with decreased responsiveness and hypotension at her PCPs office in the setting of one day of decreased responsiveness and lower speaking volume at home with 2-week history of subtly worsening weakness. MRI 10/10 negative for acute changes. CXR 10/10: No acute cardiopulmonary  process. PMH: CAD, dementia, HTN, HLD. MBS 01/02/24: normal oropharyngeal swallow. There was occasional oral holding but adequate mastication and propulsion of material into pharynx. There was intermittent, transient penetration of thin liquids into the upper larynx which was reliably ejected from the larynx upon completion of the swallow (PAS 2, WFL). Her cough (reported to occur during meals by daughter), if occurring during aspiration, appears to be protective. Type of Study: Bedside Swallow Evaluation Previous Swallow Assessment: see HPI Diet Prior to this Study: NPO;Large bore NG tube Temperature Spikes Noted: No Respiratory Status: Room air History of Recent Intubation: No Behavior/Cognition: Cooperative;Pleasant mood;Requires cueing Oral Cavity Assessment: Within Functional Limits Oral Care Completed by SLP: No Oral Cavity - Dentition: Missing dentition Self-Feeding Abilities: Total assist Patient Positioning: Upright in bed Baseline Vocal Quality: Normal Volitional Cough: Cognitively unable to elicit Volitional Swallow: Able to elicit    Oral/Motor/Sensory Function Overall Oral Motor/Sensory Function:  (Diufficult to assess)   Ice Chips Ice chips: Within functional limits Presentation: Spoon   Thin Liquid Thin Liquid: Within functional limits Presentation: Straw    Nectar Thick Nectar Thick Liquid: Not tested   Honey Thick Honey Thick Liquid: Not tested   Puree Puree: Impaired Presentation: Spoon Oral Phase Impairments: Poor awareness of bolus Oral Phase Functional Implications: Oral holding   Solid     Solid: Impaired Pharyngeal Phase Impairments: Cough - Immediate     Felicia Aird I. [place], MS, CCC-SLP Acute Rehabilitation Services Office number [phone]  Felicia Cross 02/18/2024,11:39 AM

## 2024-02-19 ENCOUNTER — Inpatient Hospital Stay (HOSPITAL_COMMUNITY): Payer: Medicare (Managed Care)

## 2024-02-19 DIAGNOSIS — R509 Fever, unspecified: Secondary | ICD-10-CM | POA: Insufficient documentation

## 2024-02-19 DIAGNOSIS — R7989 Other specified abnormal findings of blood chemistry: Secondary | ICD-10-CM | POA: Insufficient documentation

## 2024-02-19 DIAGNOSIS — E440 Moderate protein-calorie malnutrition: Secondary | ICD-10-CM | POA: Insufficient documentation

## 2024-02-19 DIAGNOSIS — E871 Hypo-osmolality and hyponatremia: Secondary | ICD-10-CM | POA: Diagnosis not present

## 2024-02-19 LAB — BASIC METABOLIC PANEL WITH GFR
Anion gap: 9 (ref 5–15)
Anion gap: 7 (ref 5–15)
BUN: 21 mg/dL (ref 8–23)
BUN: 21 mg/dL (ref 8–23)
CO2: 23 mmol/L (ref 22–32)
CO2: 23 mmol/L (ref 22–32)
Calcium: 8.3 mg/dL — ABNORMAL LOW (ref 8.9–10.3)
Calcium: 8.6 mg/dL — ABNORMAL LOW (ref 8.9–10.3)
Chloride: 96 mmol/L — ABNORMAL LOW (ref 98–111)
Chloride: 98 mmol/L (ref 98–111)
Creatinine, Ser: 0.6 mg/dL (ref 0.44–1.00)
Creatinine, Ser: 0.63 mg/dL (ref 0.44–1.00)
GFR, Estimated: >60 mL/min (ref >60)
GFR, Estimated: >60 mL/min (ref >60)
Glucose, Bld: 147 mg/dL — ABNORMAL HIGH (ref 70–99)
Glucose, Bld: 119 mg/dL — ABNORMAL HIGH (ref 70–99)
Potassium: 4 mmol/L (ref 3.5–5.1)
Potassium: 4.3 mmol/L (ref 3.5–5.1)
Sodium: 128 mmol/L — ABNORMAL LOW (ref 135–145)
Sodium: 128 mmol/L — ABNORMAL LOW (ref 135–145)

## 2024-02-19 LAB — RESP PANEL BY RT-PCR (RSV, FLU A&B, COVID)  RVPGX2
Influenza A by PCR: NEGATIVE
Influenza B by PCR: NEGATIVE
Resp Syncytial Virus by PCR: NEGATIVE
SARS Coronavirus 2 by RT PCR: NEGATIVE

## 2024-02-19 LAB — MAGNESIUM: Magnesium: 2.2 mg/dL (ref 1.7–2.4)

## 2024-02-19 LAB — PHOSPHORUS: Phosphorus: 3.2 mg/dL (ref 2.5–4.6)

## 2024-02-19 LAB — GLUCOSE, CAPILLARY: Glucose-Capillary: 110 mg/dL — ABNORMAL HIGH (ref 70–99)

## 2024-02-19 MED ORDER — POLYETHYLENE GLYCOL 3350 17 G PO PACK
17.0000 g | PACK | Freq: Every day | ORAL | Status: DC
  Administered 2024-02-20 – 2024-02-21 (×2): 17 g via ORAL
  Filled 2024-02-19 (×3): qty 1

## 2024-02-19 MED ORDER — OSMOLITE 1.5 CAL PO LIQD
1000.0000 mL | ORAL | Status: DC
  Administered 2024-02-19: 1000 mL
  Filled 2024-02-19 (×2): qty 1000

## 2024-02-19 MED ORDER — ACETAMINOPHEN 10 MG/ML IV SOLN
1000.0000 mg | Freq: Four times a day (QID) | INTRAVENOUS | Status: AC
  Administered 2024-02-20 (×4): 1000 mg via INTRAVENOUS
  Filled 2024-02-19 (×4): qty 100

## 2024-02-19 MED ORDER — SENNA 8.6 MG PO TABS
1.0000 | ORAL_TABLET | Freq: Every day | ORAL | Status: DC
  Administered 2024-02-20 – 2024-02-23 (×3): 8.6 mg via ORAL
  Filled 2024-02-19 (×3): qty 1

## 2024-02-19 NOTE — Plan of Care (Signed)
 FMTS Interim Progress Note  Night rounds. Patient appears stable from prior exam. Awakens to voice. Does not follow commands.   O: BP 125/71   Pulse (!) 104   Temp 98.6 F (37 C) (Oral)   Resp 17   Ht 5' 4 (1.626 m)   Wt 61.1 kg   SpO2 99%   BMI 23.12 kg/m   General: Chronically ill-appearing, cachetic no acute distress Cardio: RRR, no murmur on exam Pulm: No increased work of breathing Extremity: No peripheral edema  Neuro: awakens to voice, does not follow commands   A/P: Fever Unknown Source:  MRSA negative on admission.  Febrile today at 100.5 and 100.8 Monitor trend overnight  Obtain UA if fever >100.4  Cont rocephin   Cleotilde Perkins, DO 02/19/2024, 9:29 PM PGY-3, [facility] Family Medicine Service pager [phone]

## 2024-02-19 NOTE — TOC CM/SW Note (Signed)
 Transition of Care [facility]) - Inpatient Brief Assessment   Patient Details  Name: Felicia Cross MRN: [id number] Date of Birth: 1933-03-14  [facility] CM/SW Contact:    Tom-Johnson, Harvest Muskrat, RN Phone Number: 02/19/2024, 1:29 PM   Clinical Narrative:  Patient presented to the ED from her PCP's office with decreased Unresponsiveness and low Monotone. Found to be Hyponatremic. Nephrology following. Palliative following with GOC for advancing Dementia. Per RN, patient noted to have low grade fever at 100.3, Blood cx and Respiratory Panel pending.   CM went to assess patient at bedside, personal care ongoing, CM will assess at an appropriate time.  CM will continue to follow as patient progresses with care towards discharge.          Transition of Care Asessment:

## 2024-02-19 NOTE — Assessment & Plan Note (Addendum)
 Dementia - continue Namenda  10mg  BID HTN - Hold home Lopressor  25 mg BID, Spironolactone  25 mg daily, Losartan  100 mg; stopped hydralazine  (started in ICU) CAD, HLD - continue Zocor  40 mg daily

## 2024-02-19 NOTE — Progress Notes (Signed)
 Nutrition Follow-up  DOCUMENTATION CODES:  Non-severe (moderate) malnutrition in context of chronic illness  INTERVENTION:  Monitor for results of MBS and add nutrition supplements as appropriate Exchange NGT for Cortrak; discussed with MD Continue enteral nutrition support: Osmolite 1.5 at 40ml/hr (960ml daily) 60ml ProSource TF20 once daily Provides 1520 kcal, 80g protein, 731ml free water daily Juven BID to support wound healing Recommend addition of scheduled bowel regimen given no BM x 5 day  NUTRITION DIAGNOSIS:  Moderate Malnutrition related to chronic illness (dementia) as evidenced by mild fat depletion, severe muscle depletion, moderate muscle depletion. - updated 10/13  GOAL:  Patient will meet greater than or equal to 90% of their needs - goal met via TF  MONITOR:  Diet advancement, Labs, Weight trends, TF tolerance, I & O's  REASON FOR ASSESSMENT:  Consult Assessment of nutrition requirement/status  ASSESSMENT:  89 y.o. female with PMH significant of dementia, HTN, HLD, CAD, left-sided deficits secondary to contractures, and bed bound/wheel-chair bound who presented to PCP office due to decreased responsiveness/hypotension.  Admitted for AMS and hyponatremia.  10/10 Admit for hyponatremia/AMS 10/11 NGT placed; transferred to ICU for 3% saline 10/12 bedside swallow evaluation- diet transitioned to dysphagia 3; later downgraded to NPO d/t coughing post PO intake; recommend MBS  Notably, sodium improved with addition of isotonic fluids and tube feeds. Unfortunately has declined again today.   Pt planned for MBS today. Pt had large bore NGT placed. During visit, pt continued to try to clear her throat. SLP recommended exchange NGT for Cortrak which RD agrees with for comfort and better flexibility. Reached out to MD.   Checked in with patient at bedside. Her husband and one of her daughters present assisting with nutrition related history.  Pt's daughter phoned her  other daughter for additional nutrition related details.   Pt has been living at home with family. She has an aid that comes to the house. She has been eating 3 meals per day. She also really enjoys snacking on [employer] however within the last 1-2 weeks, she has continued to consume meals with stable portion sizes however she has not been consuming her [employer] daily which is when they could tell something was off.   She had an outpatient MBS about 3 weeks ago given pt with cough post oral intake. They mention that she did really well with no evidence of aspiration.   They report that her weight has remained stable to their knowledge.  Admit weight: 58.6 kg  Current weight: 61.1 kg  UOP: x24 hours  Medications: IV abx   Labs:  Sodium 128  NUTRITION - FOCUSED PHYSICAL EXAM: Flowsheet Row Most Recent Value  Orbital Region Severe depletion  Upper Arm Region Unable to assess  [arm contracted, pt not letting RD assess]  Thoracic and Lumbar Region Mild depletion  Buccal Region Moderate depletion  Temple Region Mild depletion  Clavicle Bone Region Moderate depletion  Clavicle and Acromion Bone Region Moderate depletion  Scapular Bone Region Unable to assess  Dorsal Hand Severe depletion  [contracted]  Patellar Region Severe depletion  [contracted]  Anterior Thigh Region Severe depletion  Posterior Calf Region Severe depletion  Edema (RD Assessment) None  Hair Reviewed  Eyes Reviewed  Mouth Unable to assess  Skin Reviewed  Nails Reviewed    Diet Order:   Diet Order             Diet NPO time specified  Diet effective now  EDUCATION NEEDS:  Not appropriate for education at this time  Skin:  Skin Assessment: Skin Integrity Issues: Skin Integrity Issues:: Stage III, Stage II Stage II: Left Elbow Stage III: Sacrum  Last BM:  unknown/PTA  Height:   Ht Readings from Last 1 Encounters:  02/16/24 5' 4 (1.626 m)    Weight:    Wt Readings from Last 1 Encounters:  02/19/24 61.1 kg   BMI:  Body mass index is 23.12 kg/m.  Estimated Nutritional Needs:   Kcal:  1450-1750 kcals  Protein:  75-90 grams  Fluid:  >/= 1.5L  Royce Maris, RDN, LDN Clinical Nutrition See AMiON for contact information.

## 2024-02-19 NOTE — Assessment & Plan Note (Addendum)
 Chronic, transitioning from ICU to floor today. Na 135 > 128 but value of 135 likely false given it has previously been within range of 128-131. Will repeat today to confirm. - Nephrology consulted, appreciate recommendations - BMP @ 1400, consider speaking with Nephrology if Na worsening - AM BMP, CBC

## 2024-02-19 NOTE — Procedures (Signed)
 Cortrak  Person Inserting Tube:  Mady Dolly, RD Tube Type:  Cortrak - 43 inches Tube Size:  10 Tube Location:  Left nare Secured by: Bridle Initial Placement:  Gastric Technique Used to Measure Tube Placement:  Marking at nare/corner of mouth Cortrak Secured At:  66 cm   Cortrak Tube Team Note:  Consult received to place a Cortrak feeding tube.   No x-ray is required. RN may begin using tube.   If the tube becomes dislodged please keep the tube and contact the Cortrak team at [email or web address] for replacement.  If after hours and replacement cannot be delayed, place a NG tube and confirm placement with an abdominal x-ray.   Dolly Mady MS, RD, LDN Registered Dietitian Clinical Nutrition RD Inpatient Contact Info in Amion

## 2024-02-19 NOTE — Plan of Care (Signed)
  Problem: Education: Goal: Knowledge of General Education information will improve Description: Including pain rating scale, medication(s)/side effects and non-pharmacologic comfort measures Outcome: Progressing   Problem: Clinical Measurements: Goal: Ability to maintain clinical measurements within normal limits will improve Outcome: Progressing Goal: Will remain free from infection Outcome: Progressing Goal: Diagnostic test results will improve Outcome: Progressing Goal: Respiratory complications will improve Outcome: Progressing Goal: Cardiovascular complication will be avoided Outcome: Progressing   Problem: Activity: Goal: Risk for activity intolerance will decrease Outcome: Progressing   Problem: Elimination: Goal: Will not experience complications related to bowel motility Outcome: Progressing Goal: Will not experience complications related to urinary retention Outcome: Progressing   Problem: Pain Managment: Goal: General experience of comfort will improve and/or be controlled Outcome: Progressing   Problem: Safety: Goal: Ability to remain free from injury will improve Outcome: Progressing   Problem: Skin Integrity: Goal: Risk for impaired skin integrity will decrease Outcome: Progressing

## 2024-02-19 NOTE — Assessment & Plan Note (Signed)
 T 100.5 this AM while on CTX (day 3/5) - Continue CTX 2g daily (10/10-10/15) - started in ICU 2/2 elevated procalcitonin (0.7) - Repeat Bcx ordered,  - Covid, flu, RSV panel - CXR, will consider expanding abx pending results

## 2024-02-19 NOTE — Progress Notes (Signed)
 Modified Barium Swallow Study  Patient Details  Name: Felicia Cross MRN: [id number] Date of Birth: 10-29-1932  Today's Date: 02/19/2024  Modified Barium Swallow completed.  Full report located under Chart Review in the Imaging Section.  History of Present Illness Pt is an 89 y.o. female who presented with decreased responsiveness and hypotension at her PCPs office in the setting of one day of decreased responsiveness and lower speaking volume at home with 2-week history of subtly worsening weakness. MRI 10/10 negative for acute changes. CXR 10/10: No acute cardiopulmonary process. PMH: CAD, dementia, HTN, HLD. MBS 01/02/24: normal oropharyngeal swallow. There was occasional oral holding but adequate mastication and propulsion of material into pharynx. There was intermittent, transient penetration of thin liquids into the upper larynx which was reliably ejected from the larynx upon completion of the swallow (PAS 2, WFL). Her cough (reported to occur during meals by daughter), if occurring during aspiration, appears to be protective.   Clinical Impression Pt exhibits mild oral dysphagia characterized by intermittent oral holding primarily with smaller boluses. Of note, large-bore NGT was replaced with a Cortrak prior to this MBS, which is suspected to have positively impacted her presentation. Despite being less alert during the study than during previous visit this date, she consistently protected her airway. Transient penetration occurred with thin liquids when taking the 13 mm barium tablet (PAS 2) but this is considered WFL and did not recur. Coughing was observed x1 after swallowing large volumes of thin liquids, though this did not represent aspiration. Once a swallow was initiated, oropharyngeal clearance was complete without any residue and the tablet passed through the esophagus quickly. Recommend resuming Dys 3 solids and thin liquids. Meds can be given whole with liquid. SLP will f/u at  least briefly but do not anticipate post-acute needs.  Factors that may increase risk of adverse event in presence of aspiration Noe & Lianne 2021): Poor general health and/or compromised immunity;Reduced cognitive function;Limited mobility;Dependence for feeding and/or oral hygiene  Swallow Evaluation Recommendations Recommendations: PO diet PO Diet Recommendation: Dysphagia 3 (Mechanical soft);Thin liquids (Level 0) Liquid Administration via: Cup;Straw Medication Administration: Whole meds with liquid Supervision: Staff to assist with self-feeding;Full supervision/cueing for swallowing strategies Swallowing strategies  : Minimize environmental distractions;Slow rate;Small bites/sips;Check for pocketing or oral holding Postural changes: Position pt fully upright for meals Oral care recommendations: Oral care BID (2x/day)    Damien Blumenthal, M.A., CCC-SLP Speech Language Pathology, Acute Rehabilitation Services  Secure Chat preferred [phone]  02/19/2024,2:17 PM

## 2024-02-19 NOTE — Progress Notes (Signed)
     Daily Progress Note Intern Pager: [phone]  Patient name: Felicia Cross Medical record number: [id number] Date of birth: May 18, 1932 Age: 89 y.o. Gender: female  Primary Care Provider: FORBES Nola Raisin, DO Consultants: Nephrology, Pulmonology, ICU Code Status: DNR/DNI  Pt Overview and Major Events to Date:  10/10 - Admitted 10/11 - Critical Na 119 > transferred to ICU for treatment with hypertonic saline 10/13 - transferred to back to floor  Assessment and Plan:  Felicia Cross is a 89 y.o. female with PMHx CAD, dementia, HTN, HLD who was admitted for hyponatremia and weakness. Hyponatremia stable. Fever today which we will work up as below. Assessment & Plan Hyponatremia Chronic, transitioning from ICU to floor today. Na 135 > 128 but value of 135 likely false given it has previously been within range of 128-131. Will repeat today to confirm. - Nephrology consulted, appreciate recommendations - BMP @ 1400, consider speaking with Nephrology if Na worsening - AM BMP, CBC Hypoalbuminemia due to protein-calorie malnutrition Acute right-sided weakness Likely 2/2 hyponatremia and advancing dementia. .  - RD consulted, following - AM BMP, CBC - MBS per RD - CorTrak to be placed today - Care order placed for assist with meals - Bladder scans q8hrs Elevated procalcitonin Fever T 100.5 this AM while on CTX (day 3/5) - Continue CTX 2g daily (10/10-10/15) - started in ICU 2/2 elevated procalcitonin (0.7) - Repeat Bcx ordered,  - Covid, flu, RSV panel - CXR, will consider expanding abx pending results Chronic health problem Dementia - continue Namenda  10mg  BID HTN - Hold home Lopressor  25 mg BID, Spironolactone  25 mg daily, Losartan  100 mg; stopped hydralazine  (started in ICU) CAD, HLD - continue Zocor  40 mg daily  FEN/GI: Dys 3 w/ thin liquids PPx: Lovenox  Dispo:Pending GOC discussion  Subjective:  Patient awake, resting comfortably in bed. Participating in  conversation. No complaints.  Objective: Temp:  [98.5 F (36.9 C)-100.5 F (38.1 C)] 100.5 F (38.1 C) (10/13 0703) Pulse Rate:  [83-107] 88 (10/13 0800) Resp:  [19-33] 21 (10/13 0800) BP: (109-163)/(62-84) 125/67 (10/13 0800) SpO2:  [96 %-100 %] 100 % (10/13 0800) Weight:  [61.1 kg] 61.1 kg (10/13 0420)  Physical Exam: General: Resting comfortably in bed, in NAD.  HEENT: No sign of trauma, EOM grossly intact. Cardiovascular: RRR, no m/r/g appreciated. Pulmonary: Normal WOB. CTAB with no w/c/r present. Abdomen: Soft, non-tender, non-distended. Extremities: Thin, no edema Neurologic: No focal deficits  Laboratory: Most recent CBC Lab Results  Component Value Date   WBC 10.6 (H) 02/17/2024   HGB 10.0 (L) 02/17/2024   HCT 30.0 (L) 02/17/2024   MCV 95.2 02/17/2024   PLT 240 02/17/2024   Most recent BMP    Latest Ref Rng & Units 02/19/2024    2:29 AM  BMP  Glucose 70 - 99 mg/dL 852   BUN 8 - 23 mg/dL 21   Creatinine 9.55 - 1.00 mg/dL 9.39   Sodium 864 - 854 mmol/L 128   Potassium 3.5 - 5.1 mmol/L 4.0   Chloride 98 - 111 mmol/L 96   CO2 22 - 32 mmol/L 23   Calcium 8.9 - 10.3 mg/dL 8.3     Imaging/Diagnostic Tests: None in last 24 hrs  Jerrie Gathers, DO 02/19/2024, 9:52 AM  PGY-1, [facility] Family Medicine FPTS Intern pager: [phone], text pages welcome Secure chat group [facility] Teaching Service

## 2024-02-19 NOTE — Assessment & Plan Note (Addendum)
 Likely 2/2 hyponatremia and advancing dementia. .  - RD consulted, following - AM BMP, CBC - MBS per RD - CorTrak to be placed today - Care order placed for assist with meals - Bladder scans q8hrs

## 2024-02-19 NOTE — Progress Notes (Signed)
 Speech Language Pathology Treatment: Dysphagia  Patient Details Name: Felicia Cross MRN: [id number] DOB: 20-Jul-1932 Today's Date: 02/19/2024 Time: 8996-8982 SLP Time Calculation (min) (ACUTE ONLY): 14 min  Assessment / Plan / Recommendation Clinical Impression  Pt is alert with large-bore NGT present. Throat clearance immediately followed sips of water today, increasing in intensity to coughing containing significant voicing after a delay. This also seemed to increase in frequency once offered applesauce and bite-sized solids. Suspect large-bore NGT is contributing to her presentation, though overall, this seems to represent a change in function compared to [facility] 01/02/24. Recommend proceeding with an MBS for further assessment. If ongoing enteral nutrition is though to be needed, transitioning to a smaller bore Cortrak may be beneficial prior to MBS (discussed with IMTS and RN). Allow small sips of water after oral care, but otherwise recommend she remain NPO. Will f/u as scheduling allows.    HPI HPI: Pt is an 89 y.o. female who presented with decreased responsiveness and hypotension at her PCPs office in the setting of one day of decreased responsiveness and lower speaking volume at home with 2-week history of subtly worsening weakness. MRI 10/10 negative for acute changes. CXR 10/10: No acute cardiopulmonary process. PMH: CAD, dementia, HTN, HLD. MBS 01/02/24: normal oropharyngeal swallow. There was occasional oral holding but adequate mastication and propulsion of material into pharynx. There was intermittent, transient penetration of thin liquids into the upper larynx which was reliably ejected from the larynx upon completion of the swallow (PAS 2, WFL). Her cough (reported to occur during meals by daughter), if occurring during aspiration, appears to be protective.      SLP Plan  MBS          Recommendations  Diet recommendations: NPO Medication Administration: Via alternative means                   Oral care prior to ice chip/H20;Oral care QID   Frequent or constant Supervision/Assistance Dysphagia, unspecified (R13.10)     MBS     Damien Blumenthal, M.A., CCC-SLP Speech Language Pathology, Acute Rehabilitation Services  Secure Chat preferred [phone]   02/19/2024, 10:47 AM

## 2024-02-20 DIAGNOSIS — R509 Fever, unspecified: Secondary | ICD-10-CM | POA: Diagnosis not present

## 2024-02-20 DIAGNOSIS — R638 Other symptoms and signs concerning food and fluid intake: Secondary | ICD-10-CM

## 2024-02-20 DIAGNOSIS — F03C0 Unspecified dementia, severe, without behavioral disturbance, psychotic disturbance, mood disturbance, and anxiety: Secondary | ICD-10-CM

## 2024-02-20 DIAGNOSIS — Z515 Encounter for palliative care: Secondary | ICD-10-CM | POA: Diagnosis not present

## 2024-02-20 DIAGNOSIS — E871 Hypo-osmolality and hyponatremia: Secondary | ICD-10-CM | POA: Diagnosis not present

## 2024-02-20 LAB — BASIC METABOLIC PANEL WITH GFR
Anion gap: 9 (ref 5–15)
BUN: 22 mg/dL (ref 8–23)
CO2: 24 mmol/L (ref 22–32)
Calcium: 8.1 mg/dL — ABNORMAL LOW (ref 8.9–10.3)
Chloride: 96 mmol/L — ABNORMAL LOW (ref 98–111)
Creatinine, Ser: 0.59 mg/dL (ref 0.44–1.00)
GFR, Estimated: >60 mL/min (ref >60)
Glucose, Bld: 137 mg/dL — ABNORMAL HIGH (ref 70–99)
Potassium: 4.4 mmol/L (ref 3.5–5.1)
Sodium: 129 mmol/L — ABNORMAL LOW (ref 135–145)

## 2024-02-20 LAB — CBC
HCT: 26.4 % — ABNORMAL LOW (ref 36.0–46.0)
HCT: 27.7 % — ABNORMAL LOW (ref 36.0–46.0)
Hemoglobin: 8.9 g/dL — ABNORMAL LOW (ref 12.0–15.0)
Hemoglobin: 9.3 g/dL — ABNORMAL LOW (ref 12.0–15.0)
MCH: 31.9 pg (ref 26.0–34.0)
MCH: 32.1 pg (ref 26.0–34.0)
MCHC: 33.7 g/dL (ref 30.0–36.0)
MCHC: 33.6 g/dL (ref 30.0–36.0)
MCV: 94.6 fL (ref 80.0–100.0)
MCV: 95.5 fL (ref 80.0–100.0)
Platelets: 206 K/uL (ref 150–400)
Platelets: 221 K/uL (ref 150–400)
RBC: 2.79 MIL/uL — ABNORMAL LOW (ref 3.87–5.11)
RBC: 2.9 MIL/uL — ABNORMAL LOW (ref 3.87–5.11)
RDW: 13.4 % (ref 11.5–15.5)
RDW: 13.5 % (ref 11.5–15.5)
WBC: 8.4 K/uL (ref 4.0–10.5)
WBC: 7.9 K/uL (ref 4.0–10.5)
nRBC: 0 % (ref 0.0–0.2)
nRBC: 0 % (ref 0.0–0.2)

## 2024-02-20 NOTE — Progress Notes (Signed)
 Patient ID: Felicia Cross, female   DOB: 1933/04/27, 89 y.o.   MRN: [id number]    Progress Note from the Palliative Medicine Team at [facility]   Patient Name: Felicia Cross        Date: 02/20/2024 DOB: [birth year]  Age: 89 y.o. 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 Palliative care has been asked to support additional goals of care conversations in the setting of dementia.   Patient does not have medical decision-making capacity at this time.  Patient is a member of [facility] program/  Family face treatment option decision, advanced directive decisions and anticipatory care needs.   Subjective  Extensive chart review has been completed prior to meeting with patient/family  including labs, vital signs, imaging, progress/consult notes, orders, medications and available advance directive documents.    This NP assessed patient at the bedside as a follow up for palliative medicine needs and emotional support.  Patient is awake, non verbal and unable to follow commands.  Spoke with PACE/case manager/ Clarita Cong LCSW.  Clarita tells me patient has been with her program for less than a year, there are many days that family does not participate in services offered.  Patient typically arrives and sits up most of the day in a wheelchair  I was able to speak to patient's daughter Felicia Cross.  She understand the seriousness of the medical situation but at this time remains hopeful that patient will regain the ability to eat on her own, hopefully supporting herself from a nutritional and hydration status.   Education  offered on the natural trajectory and expectations in a person with dementia and overall failure to thrive.  We discussed a natural dehydration and decreased oral intake as part of the process.    Education offered on the natural trajectory and expectations at end-of-life.  Continue with core track and allow time for outcomes.   Daughter  emailed her ACP documents I placed in Hard chart and made copy for scanning  Education offered today regarding  the importance of continued conversation with family and their  medical providers regarding overall plan of care and treatment options,  ensuring decisions are within the context of the patients values and GOCs.  Questions and concerns addressed   Discussed with primary team and nursing staff   Time: 50  minutes  Detailed review of medical records ( labs, imaging, vital signs), medically appropriate exam ( MS, skin, cardiac,  resp)   discussed with treatment team, counseling and education to patient, family, staff, documenting clinical information, medication management, coordination of care    Ronal Plants NP  Palliative Medicine Team Team Phone # [phone] Pager [phone]

## 2024-02-20 NOTE — Assessment & Plan Note (Addendum)
 Dementia - continue Namenda  10mg  BID HTN - Hold home Lopressor  25 mg BID, Spironolactone  25 mg daily, Losartan  100 mg; stopped hydralazine  (started in ICU) CAD, HLD - continue Zocor  40 mg daily

## 2024-02-20 NOTE — Assessment & Plan Note (Addendum)
 Likely 2/2 hyponatremia and advancing dementia. Now that patient is stable and off mIVF, will give her a few days to see if PO intake improves. R sided weakness resolved, MRI negative.  - RD/SLP following - Large-bore NGT replaced with Cortrak 10/13 d/t suspicion NGT contributing to current presentation. Patient showed improvement with Cortrak -  MBS completed yesterday  - AM CMP, CBC - Bladder scans q8hrs - Strict I/Os - will reach out to nursing to ensure accurate documentation of all voids - Advance diet as tolerated

## 2024-02-20 NOTE — Progress Notes (Addendum)
 Daily Progress Note Intern Pager: [phone]  Patient name: Felicia Cross Medical record number: [id number] Date of birth: [birth year] Age: 89 y.o. Gender: female  Primary Care Provider: FORBES Nola Raisin, DO Consultants: nephrology, pulmonology, ICU  Code Status: DNR/DNI  Pt Overview and Major Events to Date:  10/10 - Admitted 10/11 - Critical Na 119 > transferred to ICU for treatment with hypertonic saline 10/13 - transferred to back to floor  Assessment and Plan: Felicia Cross is a 89 y.o. female with PMHx CAD, dementia, HTN, HLD who was admitted for hyponatremia and weakness. Hyponatremia stable and now focusing on improving PO intake. Hospital course complicated by fever.  Assessment & Plan Hyponatremia Chronic, stable with Na 128 > 129. - Nephrology consulted, appreciate recommendations - AM BMP, CBC Hypoalbuminemia due to protein-calorie malnutrition Acute right-sided weakness Malnutrition of moderate degree Likely 2/2 hyponatremia and advancing dementia. Now that patient is stable and off mIVF, will give her a few days to see if PO intake improves. R sided weakness resolved, MRI negative.  - RD/SLP following - Large-bore NGT replaced with Cortrak 10/13 d/t suspicion NGT contributing to current presentation. Patient showed improvement with Cortrak -  MBS completed yesterday  - AM CMP, CBC - Bladder scans q8hrs - Strict I/Os - will reach out to nursing to ensure accurate documentation of all voids - Advance diet as tolerated Elevated procalcitonin Fever Unclear origin. Last fever with T 100.8 on 10/13  at 1457. MRSA negative on admission, respiratory panel negative and CXR without acute process. Flu/COVID negative. Sacral pressure wound appears unchanged from admission, not acutely infectious and likely not contributing. - Continue CTX 2g daily (10/10-10/15) - F/u Bcx - Consider UA if patient continues to fever Chronic health problem Dementia - continue Namenda   10mg  BID HTN - Hold home Lopressor  25 mg BID, Spironolactone  25 mg daily, Losartan  100 mg; stopped hydralazine  (started in ICU) CAD, HLD - continue Zocor  40 mg daily  Hgb 8.9, down from 10.0. Previous Hgb likely dilutional given mIVF but will repeat. - PM CBC - AM CBC  FEN/GI: Dys 3 with thin liquids with supevision PPx: Lovenox  Dispo:Pending GOC discussions pending clinical improvement .  Subjective:  Patient laying in bed, easily awakes to voice. States she is beginning to eat per her diet. Discussed increasing PO intake to which patient is agreeable.  Objective: Temp:  [98.2 F (36.8 C)-100.8 F (38.2 C)] 98.2 F (36.8 C) (10/14 0417) Pulse Rate:  [88-106] 92 (10/14 0417) Resp:  [17-25] 18 (10/14 0417) BP: (107-145)/(54-85) 114/67 (10/14 0417) SpO2:  [97 %-100 %] 99 % (10/14 0417)  Physical Exam: General: Resting comfortably in bed, NAD. Easily awakes to voice Cardiovascular: RRR, no m/r/g appreciated. Pulmonary: Normal WOB. CTAB with no w/c/r present.  Abdomen: Soft, non-tender, non-distended. Skin: Sacral wound ~4cm in size, unchanged from admission.   Laboratory: Most recent CBC Lab Results  Component Value Date   WBC 8.4 02/20/2024   HGB 8.9 (L) 02/20/2024   HCT 26.4 (L) 02/20/2024   MCV 94.6 02/20/2024   PLT 206 02/20/2024   Most recent BMP    Latest Ref Rng & Units 02/20/2024    1:58 AM  BMP  Glucose 70 - 99 mg/dL 862   BUN 8 - 23 mg/dL 22   Creatinine 9.55 - 1.00 mg/dL 9.40   Sodium 864 - 854 mmol/L 129   Potassium 3.5 - 5.1 mmol/L 4.4   Chloride 98 - 111 mmol/L 96   CO2 22 -  32 mmol/L 24   Calcium 8.9 - 10.3 mg/dL 8.1     Imaging/Diagnostic Tests: DG CHEST PORT 1 VIEW Partial obscuration of the right hemidiaphragm may be due to scarring or small pleural effusion in the setting of elevated hemidiaphragm.  Minor atelectasis in the retrocardiac left lung base  DG Swallowing Func-Speech Pathology Clinical Impression: Pt exhibits mild oral  dysphagia characterized by intermittent oral holding primarily with smaller boluses. Of note, large-bore NGT was replaced with a Cortrak prior to this MBS, which is suspected to have positively impacted her presentation. Despite being less alert during the study than during previous visit this date, she consistently protected her airway. Transient penetration occurred with thin liquids when taking the 13 mm barium tablet (PAS 2) but this is considered WFL and did not recur. Coughing was observed x1 after swallowing large volumes of thin liquids, though this did not represent aspiration. Once a swallow was initiated, oropharyngeal clearance was complete without any residue and the tablet passed through the esophagus quickly. Recommend resuming Dys 3 solids and thin liquids. Meds can be given whole with liquid. SLP will f/u at least briefly but do not anticipate post-acute needs.    Jerrie Gathers, DO 02/20/2024, 7:13 AM  PGY-1, [facility] Family Medicine FPTS Intern pager: [phone], text pages welcome Secure chat group [facility] Teaching Service

## 2024-02-20 NOTE — Assessment & Plan Note (Addendum)
 Chronic, stable with Na 128 > 129. - Nephrology consulted, appreciate recommendations - AM BMP, CBC

## 2024-02-20 NOTE — Care Management Important Message (Signed)
 Important Message  Patient Details  Name: Felicia Cross MRN: [id number] Date of Birth: 08/04/1932   Important Message Given:  Yes - Medicare IM     Claretta Deed 02/20/2024, 11:16 AM

## 2024-02-20 NOTE — Assessment & Plan Note (Addendum)
 Unclear origin. Last fever with T 100.8 on 10/13  at 1457. MRSA negative on admission, respiratory panel negative and CXR without acute process. Flu/COVID negative. Sacral pressure wound appears unchanged from admission, not acutely infectious and likely not contributing. - Continue CTX 2g daily (10/10-10/15) - F/u Bcx - Consider UA if patient continues to fever

## 2024-02-21 ENCOUNTER — Inpatient Hospital Stay (HOSPITAL_COMMUNITY): Payer: Medicare (Managed Care)

## 2024-02-21 DIAGNOSIS — R7989 Other specified abnormal findings of blood chemistry: Secondary | ICD-10-CM | POA: Insufficient documentation

## 2024-02-21 DIAGNOSIS — E440 Moderate protein-calorie malnutrition: Secondary | ICD-10-CM

## 2024-02-21 DIAGNOSIS — R638 Other symptoms and signs concerning food and fluid intake: Secondary | ICD-10-CM | POA: Diagnosis not present

## 2024-02-21 DIAGNOSIS — R600 Localized edema: Secondary | ICD-10-CM | POA: Insufficient documentation

## 2024-02-21 DIAGNOSIS — R609 Edema, unspecified: Secondary | ICD-10-CM

## 2024-02-21 DIAGNOSIS — Z515 Encounter for palliative care: Secondary | ICD-10-CM | POA: Diagnosis not present

## 2024-02-21 DIAGNOSIS — F03C0 Unspecified dementia, severe, without behavioral disturbance, psychotic disturbance, mood disturbance, and anxiety: Secondary | ICD-10-CM | POA: Diagnosis not present

## 2024-02-21 DIAGNOSIS — E871 Hypo-osmolality and hyponatremia: Secondary | ICD-10-CM | POA: Diagnosis not present

## 2024-02-21 LAB — COMPREHENSIVE METABOLIC PANEL WITH GFR
ALT: 114 U/L — ABNORMAL HIGH (ref 0–44)
AST: 112 U/L — ABNORMAL HIGH (ref 15–41)
Albumin: 2.2 g/dL — ABNORMAL LOW (ref 3.5–5.0)
Alkaline Phosphatase: 46 U/L (ref 38–126)
Anion gap: 8 (ref 5–15)
BUN: 24 mg/dL — ABNORMAL HIGH (ref 8–23)
CO2: 24 mmol/L (ref 22–32)
Calcium: 8.4 mg/dL — ABNORMAL LOW (ref 8.9–10.3)
Chloride: 96 mmol/L — ABNORMAL LOW (ref 98–111)
Creatinine, Ser: 0.59 mg/dL (ref 0.44–1.00)
GFR, Estimated: >60 mL/min (ref >60)
Glucose, Bld: 122 mg/dL — ABNORMAL HIGH (ref 70–99)
Potassium: 4.6 mmol/L (ref 3.5–5.1)
Sodium: 128 mmol/L — ABNORMAL LOW (ref 135–145)
Total Bilirubin: 0.4 mg/dL (ref 0.0–1.2)
Total Protein: 5.2 g/dL — ABNORMAL LOW (ref 6.5–8.1)

## 2024-02-21 LAB — CULTURE, BLOOD (ROUTINE X 2)
Culture: NO GROWTH
Culture: NO GROWTH
Special Requests: ADEQUATE

## 2024-02-21 LAB — CBC
HCT: 28.7 % — ABNORMAL LOW (ref 36.0–46.0)
Hemoglobin: 9.5 g/dL — ABNORMAL LOW (ref 12.0–15.0)
MCH: 31.7 pg (ref 26.0–34.0)
MCHC: 33.1 g/dL (ref 30.0–36.0)
MCV: 95.7 fL (ref 80.0–100.0)
Platelets: 228 K/uL (ref 150–400)
RBC: 3 MIL/uL — ABNORMAL LOW (ref 3.87–5.11)
RDW: 13.4 % (ref 11.5–15.5)
WBC: 7.2 K/uL (ref 4.0–10.5)
nRBC: 0 % (ref 0.0–0.2)

## 2024-02-21 MED ORDER — GUAIFENESIN-DM 100-10 MG/5ML PO SYRP
10.0000 mL | ORAL_SOLUTION | ORAL | Status: DC | PRN
  Administered 2024-02-21: 10 mL
  Filled 2024-02-21: qty 10

## 2024-02-21 NOTE — Assessment & Plan Note (Addendum)
 Stable, no fevers in 24 hours. Unclear origin. - Continue CTX 2g daily (10/10-10/15) - Bcx -  NG @ 2 days, final results pending - Consider UA if patient continues to fever

## 2024-02-21 NOTE — Assessment & Plan Note (Addendum)
 Dementia - continue Namenda  10mg  BID HTN - Hold home Lopressor  25 mg BID, Spironolactone  25 mg daily, Losartan  100 mg; stopped hydralazine  (started in ICU) CAD, HLD - discontinue  Zocor  40 mg daily

## 2024-02-21 NOTE — Assessment & Plan Note (Addendum)
 Likely 2/2 hyponatremia and advancing dementia. Will monitor PO intake now that cortrak is in place. Peg tube may be necessary if no improvement. Spoke with Palliative who states they have a copy of the patient's advanced directive stating does not want artificial feeding or hydration in the event of terminal illness. Per palliative, PACE is involved and recommend ongoing GOC conversation.  - RD/SLP following - Cortrak placed 10/13  - Bladder scans q8hrs, Purewick in place - Strict I/Os - Will call family to discuss assisting with feeds to improve PO intake - Continued GOC conversation

## 2024-02-21 NOTE — Assessment & Plan Note (Addendum)
 AST 112, ALT  114 which are 3x the upper limit of normal. It is possible tube feeds are the likely cause but wiill work up as below. - Hepatitis panel - Consider ultrasound  if LFTs continue trending upwards - Holding home Simvastatin  for now

## 2024-02-21 NOTE — Progress Notes (Signed)
 Speech Language Pathology Treatment: Dysphagia  Patient Details Name: Felicia Cross MRN: [id number] DOB: 11/15/1932 Today's Date: 02/21/2024 Time: 8880-8872 SLP Time Calculation (min) (ACUTE ONLY): 8 min  Assessment / Plan / Recommendation Clinical Impression  Pt was attentive to regular solids, transiting them through her oral cavity quickly and completely. She takes large sips of liquid but no s/s of aspiration follow. Recommend advancing to regular solids and continuing thin liquids with full supervision and assistance at meal times. Ongoing SLP f/u is not needed acutely, will sign off.    HPI HPI: Pt is an 89 y.o. female who presented with decreased responsiveness and hypotension at her PCPs office in the setting of one day of decreased responsiveness and lower speaking volume at home with 2-week history of subtly worsening weakness. MRI 10/10 negative for acute changes. CXR 10/10: No acute cardiopulmonary process. PMH: CAD, dementia, HTN, HLD. MBS 01/02/24: normal oropharyngeal swallow. There was occasional oral holding but adequate mastication and propulsion of material into pharynx. There was intermittent, transient penetration of thin liquids into the upper larynx which was reliably ejected from the larynx upon completion of the swallow (PAS 2, WFL). Her cough (reported to occur during meals by daughter), if occurring during aspiration, appears to be protective.      SLP Plan  All goals met          Recommendations  Diet recommendations: Regular;Thin liquid Liquids provided via: Cup;Straw Medication Administration: Whole meds with liquid Supervision: Staff to assist with self feeding;Full supervision/cueing for compensatory strategies;Trained caregiver to feed patient Compensations: Slow rate;Small sips/bites Postural Changes and/or Swallow Maneuvers: Seated upright 90 degrees                  Oral care BID   None Dysphagia, oral phase (R13.11)     All goals met      Damien Blumenthal, M.A., CCC-SLP Speech Language Pathology, Acute Rehabilitation Services  Secure Chat preferred [phone]   02/21/2024, 12:02 PM

## 2024-02-21 NOTE — Progress Notes (Signed)
 Patient ID: Felicia Cross, female   DOB: 12-08-1932, 89 y.o.   MRN: [id number]    Progress Note from the Palliative Medicine Team at [facility]   Patient Name: Felicia Cross        Date: 02/21/2024 DOB: Mar 28, 1933  Age: 89 y.o. 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 Palliative care has been asked to support additional goals of care conversations in the setting of dementia.   Patient does not have medical decision-making capacity at this time.   Patient is a member of PACE program/ they are aware of this hospitalization.  Family face treatment option decision, advanced directive decisions and anticipatory care needs.   Subjective  Extensive chart review has been completed prior to meeting with patient/family  including labs, vital signs, imaging, progress/consult notes, orders, medications and available advance directive documents.    This NP assessed patient at the bedside as a follow up for palliative medicine needs and emotional support.  Patient is awake, non verbal and unable to follow commands.  No family at bedside.  Unable to speak with family by phone, however attending team discussed current medical situation and they are asking to talk with PMT regarding a shift to a more comfort path.     Plan for  PMT to contact the family in the morning to coordinate a time for continued discussions regarding goals of care.  In review of the document patient names Felicia Cross as primary healthcare agent and states desire not to be kept alive with artificial life support systems including artificial  feeding and hydration allowing for more comfortable death.  I  will be out of the hospital tomorrow but my colleague Tobey Barnacle will follow-up for ongoing palliative medicine support.    Discussed with primary team and nursing staff  Call PMT with questions or concerns   Time: 25  minutes  Detailed review of medical records ( labs, imaging, vital signs), medically appropriate exam ( MS, skin, cardiac,  resp)   discussed with treatment team, counseling and education to patient, family, staff, documenting clinical information, medication management, coordination of care    Ronal Plants NP  Palliative Medicine Team Team Phone # [phone] Pager [phone]

## 2024-02-21 NOTE — Plan of Care (Signed)
 Called and spoke with patient's daughter Felicia Cross regarding patient's nutrition status.  Explained that given the patient is likely in the end stages of dementia, we would not recommend prolonged use of core track specially in light of her [place] directives which clearly stated that she would not want artificial nutrition or hydration in the event that she had an illness which would not improve.  Advised that the family should prioritize coming in around mealtimes to assist her with feeding and to see how she is doing for themselves to get an idea of where she is at functionally.  Daughter voiced understanding and agreement with this plan.  Reiterated that the goal would be to discontinue use of core track prior to discharge from the hospital given the patient's prognosis.  Felicia Pinal, DO PGY-2, Family Medicine

## 2024-02-21 NOTE — Assessment & Plan Note (Addendum)
 Hgb 8.9 > 9.5. Likely 2/2 blood draws, poor nutrition and mIVF.  - AM CBC

## 2024-02-21 NOTE — Progress Notes (Addendum)
 Daily Progress Note Intern Pager: [phone]  Patient name: Felicia Cross Medical record number: [id number] Date of birth: June 20, 1932 Age: 89 y.o. Gender: female  Primary Care Provider: FORBES Nola Raisin, DO Consultants: Nephrology, pulmonology, ICU Code Status: DNR/DNI  Pt Overview and Major Events to Date:  10/10 - Admitted 10/11 - Critical Na 119 > transferred to ICU for treatment with hypertonic saline 10/13 - transferred to back to floor  Assessment and Plan:  Felicia Cross is a 89 y.o. female with PMHx CAD, dementia, HTN, HLD who was admitted for hyponatremia and weakness. Hyponatremia stable and now focusing on improving PO intake. Hospital course complicated by fever.  Assessment & Plan Hyponatremia Chronic, stable with Na 128 - Nephrology previously consulted, signed off - AM CMP Hypoalbuminemia due to protein-calorie malnutrition Acute right-sided weakness Malnutrition of moderate degree Likely 2/2 hyponatremia and advancing dementia. Will monitor PO intake now that cortrak is in place. Peg tube may be necessary if no improvement. Spoke with Palliative who states they have a copy of the patient's advanced directive stating does not want artificial feeding or hydration in the event of terminal illness. Per palliative, PACE is involved and recommend ongoing GOC conversation.  - RD/SLP following - Cortrak placed 10/13  - Bladder scans q8hrs, Purewick in place - Strict I/Os - Will call family to discuss assisting with feeds to improve PO intake - Continued GOC conversation Elevated procalcitonin Fever Stable, no fevers in 24 hours. Unclear origin. - Continue CTX 2g daily (10/10-10/15) - Bcx -  NG @ 2 days, final results pending - Consider UA if patient continues to fever Edema of right upper extremity Swelling noted this AM to the RUE. Possible DVT from IV in place, will get imaging to further evaluate. - RUE venous u/s to r/o DVT Elevated LFTs AST 112, ALT   114 which are 3x the upper limit of normal. It is possible tube feeds are the likely cause but wiill work up as below. - Hepatitis panel - Consider ultrasound  if LFTs continue trending upwards - Holding home Simvastatin  for now Normocytic anemia Hgb 8.9 > 9.5. Likely 2/2 blood draws, poor nutrition and mIVF.  - AM CBC Chronic health problem Dementia - continue Namenda  10mg  BID HTN - Hold home Lopressor  25 mg BID, Spironolactone  25 mg daily, Losartan  100 mg; stopped hydralazine  (started in ICU) CAD, HLD - discontinue  Zocor  40 mg daily  FEN/GI: Dys 3 with thin liquids with supervision, advance as tolerated PPx: Lovenox  Dispo:Pending GOC discussions and improved PO intake   Subjective:  Patient resting comfortably in bed. Attempts to answer questions. States her appetite is improving.  Objective: Temp:  [98.9 F (37.2 C)-99.3 F (37.4 C)] 99.1 F (37.3 C) (10/15 0430) Pulse Rate:  [94-109] 101 (10/15 0754) Resp:  [15-18] 18 (10/15 0754) BP: (120-134)/(65-78) 120/78 (10/15 0754) SpO2:  [99 %-100 %] 100 % (10/15 0430)  Physical Exam: General: Resting in bed, NAD Cardiovascular: RRR, no m/r/g appreciated. Pulmonary: Normal WOB. CTAB with no w/c/r present. Extremities: Edema to the R arm, radial pulse 2+, warm Neurologic: Patient is not oriented to place or time  Laboratory: Most recent CBC Lab Results  Component Value Date   WBC 7.2 02/21/2024   HGB 9.5 (L) 02/21/2024   HCT 28.7 (L) 02/21/2024   MCV 95.7 02/21/2024   PLT 228 02/21/2024   Most recent BMP    Latest Ref Rng & Units 02/21/2024    2:20 AM  BMP  Glucose 70 - 99 mg/dL 877   BUN 8 - 23 mg/dL 24   Creatinine 9.55 - 1.00 mg/dL 9.40   Sodium 864 - 854 mmol/L 128   Potassium 3.5 - 5.1 mmol/L 4.6   Chloride 98 - 111 mmol/L 96   CO2 22 - 32 mmol/L 24   Calcium 8.9 - 10.3 mg/dL 8.4     Imaging/Diagnostic Tests: None in 24 hrs  Jerrie Gathers, DO 02/21/2024, 8:31 AM  PGY-1, [facility] Family  Medicine FPTS Intern pager: [phone], text pages welcome Secure chat group [facility] Teaching Service

## 2024-02-21 NOTE — Assessment & Plan Note (Addendum)
 Chronic, stable with Na 128 - Nephrology previously consulted, signed off - AM CMP

## 2024-02-21 NOTE — Plan of Care (Signed)
  Problem: Nutrition: Goal: Adequate nutrition will be maintained Outcome: Progressing  Still c TF. Drank ensure well but little PO intake. Dysphagia diet

## 2024-02-21 NOTE — Assessment & Plan Note (Addendum)
 Swelling noted this AM to the RUE. Possible DVT from IV in place, will get imaging to further evaluate. - RUE venous u/s to r/o DVT

## 2024-02-21 NOTE — Progress Notes (Signed)
 Right upper extremity venous duplex has been completed.  Results can be found in chart review under CV Proc.  02/21/2024 2:24 PM  Besse Miron Elden Appl, RVT.

## 2024-02-22 DIAGNOSIS — E440 Moderate protein-calorie malnutrition: Secondary | ICD-10-CM | POA: Diagnosis not present

## 2024-02-22 DIAGNOSIS — R000 Tachycardia, unspecified: Secondary | ICD-10-CM

## 2024-02-22 DIAGNOSIS — F02C0 Dementia in other diseases classified elsewhere, severe, without behavioral disturbance, psychotic disturbance, mood disturbance, and anxiety: Secondary | ICD-10-CM

## 2024-02-22 DIAGNOSIS — R627 Adult failure to thrive: Secondary | ICD-10-CM

## 2024-02-22 DIAGNOSIS — G309 Alzheimer's disease, unspecified: Secondary | ICD-10-CM

## 2024-02-22 DIAGNOSIS — Z66 Do not resuscitate: Secondary | ICD-10-CM

## 2024-02-22 DIAGNOSIS — Z7189 Other specified counseling: Secondary | ICD-10-CM

## 2024-02-22 DIAGNOSIS — Z515 Encounter for palliative care: Secondary | ICD-10-CM | POA: Diagnosis not present

## 2024-02-22 DIAGNOSIS — E871 Hypo-osmolality and hyponatremia: Secondary | ICD-10-CM | POA: Diagnosis not present

## 2024-02-22 LAB — CBC
HCT: 26.9 % — ABNORMAL LOW (ref 36.0–46.0)
Hemoglobin: 9.1 g/dL — ABNORMAL LOW (ref 12.0–15.0)
MCH: 32.3 pg (ref 26.0–34.0)
MCHC: 33.8 g/dL (ref 30.0–36.0)
MCV: 95.4 fL (ref 80.0–100.0)
Platelets: 257 K/uL (ref 150–400)
RBC: 2.82 MIL/uL — ABNORMAL LOW (ref 3.87–5.11)
RDW: 13.3 % (ref 11.5–15.5)
WBC: 9.6 K/uL (ref 4.0–10.5)
nRBC: 0 % (ref 0.0–0.2)

## 2024-02-22 LAB — COMPREHENSIVE METABOLIC PANEL WITH GFR
ALT: 118 U/L — ABNORMAL HIGH (ref 0–44)
AST: 104 U/L — ABNORMAL HIGH (ref 15–41)
Albumin: 2.2 g/dL — ABNORMAL LOW (ref 3.5–5.0)
Alkaline Phosphatase: 49 U/L (ref 38–126)
Anion gap: 8 (ref 5–15)
BUN: 27 mg/dL — ABNORMAL HIGH (ref 8–23)
CO2: 26 mmol/L (ref 22–32)
Calcium: 8.4 mg/dL — ABNORMAL LOW (ref 8.9–10.3)
Chloride: 94 mmol/L — ABNORMAL LOW (ref 98–111)
Creatinine, Ser: 0.57 mg/dL (ref 0.44–1.00)
GFR, Estimated: >60 mL/min (ref >60)
Glucose, Bld: 138 mg/dL — ABNORMAL HIGH (ref 70–99)
Potassium: 4.9 mmol/L (ref 3.5–5.1)
Sodium: 128 mmol/L — ABNORMAL LOW (ref 135–145)
Total Bilirubin: 0.5 mg/dL (ref 0.0–1.2)
Total Protein: 5.1 g/dL — ABNORMAL LOW (ref 6.5–8.1)

## 2024-02-22 LAB — HEPATITIS PANEL, ACUTE
HCV Ab: NONREACTIVE
Hep A IgM: NONREACTIVE
Hep B C IgM: NONREACTIVE
Hepatitis B Surface Ag: NONREACTIVE

## 2024-02-22 MED ORDER — METOPROLOL TARTRATE 50 MG PO TABS: 50.0000 mg | ORAL_TABLET | Freq: Two times a day (BID) | ORAL | Status: DC

## 2024-02-22 MED ORDER — MEMANTINE HCL 10 MG PO TABS
10.0000 mg | ORAL_TABLET | Freq: Two times a day (BID) | ORAL | Status: DC
  Administered 2024-02-22 – 2024-02-23 (×2): 10 mg via ORAL
  Filled 2024-02-22 (×2): qty 1

## 2024-02-22 MED ORDER — ENSURE PLUS HIGH PROTEIN PO LIQD
237.0000 mL | Freq: Two times a day (BID) | ORAL | Status: DC
  Administered 2024-02-22 (×2): 237 mL via ORAL

## 2024-02-22 MED ORDER — METOPROLOL TARTRATE 50 MG PO TABS
50.0000 mg | ORAL_TABLET | Freq: Two times a day (BID) | ORAL | Status: DC
  Administered 2024-02-22 – 2024-02-23 (×2): 50 mg via ORAL
  Filled 2024-02-22 (×2): qty 1

## 2024-02-22 NOTE — TOC Progression Note (Addendum)
 Transition of Care [facility]) - Progression Note    Patient Details  Name: Felicia Cross MRN: [id number] Date of Birth: 02-15-1933  Transition of [facility]) CM/SW Contact  Tom-Johnson, Odilia Damico Daphne, RN Phone Number: 02/22/2024, 2:43 PM  Clinical Narrative:     Patient continues with Tube feeding via Cortrak. Palliative following with GOC. Patient not Medically ready for discharge.  CM will continue to follow as patient progresses with care towards discharge.   15:40- CM notified by Palliative NP that patient will be discharging home tomorrow with PACE and focus on Comfort measures. CM called Clarita, SW at [facility] and left a return call message. CM spoke with patient's daughter [name] at bedside and she confirms patient is discharging home on Comfort measures. Awaiting Janet's call from PACE to arrange transportation for tomorrow.   CM will continue to follow as patient progresses with care towards discharge.                       Expected Discharge Plan and Services                                               Social Drivers of Health (SDOH) Interventions SDOH Screenings   Food Insecurity: No Food Insecurity (02/16/2024)  Housing: Low Risk  (02/16/2024)  Transportation Needs: No Transportation Needs (02/16/2024)  Utilities: Not At Risk (02/16/2024)  Depression (PHQ2-9): Low Risk  (01/06/2023)  Financial Resource Strain: Low Risk  (12/13/2021)  Physical Activity: Inactive (12/13/2021)  Social Connections: Moderately Integrated (02/16/2024)  Stress: No Stress Concern Present (12/13/2021)  Tobacco Use: Medium Risk (02/16/2024)    Readmission Risk Interventions     No data to display

## 2024-02-22 NOTE — Assessment & Plan Note (Addendum)
 Dementia - continue Namenda  10mg  BID HTN - Continue home Spironolactone  25 mg daily, Losartan  100 mg; Increase Metoprolol  tartrate to 50mg  BID for tachycardia CAD, HLD - discontinue  Zocor  40 mg daily

## 2024-02-22 NOTE — Plan of Care (Signed)
   Problem: Clinical Measurements: Goal: Diagnostic test results will improve Outcome: Progressing

## 2024-02-22 NOTE — Assessment & Plan Note (Addendum)
 AST 112 > 104, ALT 114 > 118 which are 3x the upper limit of normal. Hepatitis panel negative. Differential includes tube feeds vs Ceftriaxone. Will continue to trend. - AM CMP - Consider ultrasound  if LFTs continue trending upwards - Holding home Simvastatin  for now

## 2024-02-22 NOTE — Progress Notes (Addendum)
 Brief Nutrition Support Note  Pt currently on continuous tube feeds, but palliative following. MD discussed with pt's daughter about advance directive would stated pt would not want long term use of tube feeds to prolong life. Will continue to monitor GOC discussions and will assist in any way with pt's care.  Added Ensure supplements and magic cups to trays to encourage adequate PO intake.   Please reach out if RD can assist in any further way.   Josette Glance, MS, RDN, LDN Clinical Dietitian I Please reach out via secure chat or use RD Inpatient secure chat if no response

## 2024-02-22 NOTE — Assessment & Plan Note (Addendum)
 Hgb 9.1, stable. Likely 2/2 blood draws, poor nutrition and mIVF.  - AM CBC

## 2024-02-22 NOTE — Progress Notes (Signed)
 Daily Progress Note Intern Pager: [phone]  Patient name: Felicia Cross Medical record number: [id number] Date of birth: January 14, 1933 Age: 89 y.o. Gender: female  Primary Care Provider: FORBES Nola Raisin, DO Consultants: Nephrology, pulmonology, ICU Code Status: DNR/DNI  Pt Overview and Major Events to Date:  10/10 - Admitted 10/11 - Critical Na 119 > transferred to ICU for treatment with hypertonic saline 10/13 - transferred to back to floor  Assessment and Plan:  Felicia Cross is a 89 y.o. female with PMHx CAD, dementia, HTN, HLD who was admitted for hyponatremia and weakness. Hyponatremia stable and now focusing on improving PO intake. Hospital course complicated by fever.  Assessment & Plan Hyponatremia Chronic, stable with Na 128 - Nephrology previously consulted, signed off - AM CMP Hypoalbuminemia due to protein-calorie malnutrition Acute right-sided weakness Malnutrition of moderate degree Likely 2/2 hyponatremia and advancing dementia. Family would like to focus on comfort care at this point.  - RD/SLP following - Cortrak placed 10/13  - Bladder scans q8hrs, Purewick in place - Strict I/Os - Palliative consulted - They will continue GOC conversations regarding Cortrak removal today Elevated procalcitonin Fever Stable, no fevers in 24 hours. Unclear origin. - Bcx -  NG @ 3 days, final results pending - Consider UA if patient continues to fever Edema of right upper extremity Continued swelling of the RUE but DVT negative for clot. - Will continue to monitor Elevated LFTs AST 112 > 104, ALT 114 > 118 which are 3x the upper limit of normal. Hepatitis panel negative. Differential includes tube feeds vs Ceftriaxone. Will continue to trend. - AM CMP - Consider ultrasound  if LFTs continue trending upwards - Holding home Simvastatin  for now Normocytic anemia Hgb 9.1, stable. Likely 2/2 blood draws, poor nutrition and mIVF.  - AM CBC Chronic health  problem Dementia - continue Namenda  10mg  BID HTN - Continue home Spironolactone  25 mg daily, Losartan  100 mg; Increase Metoprolol  tartrate to 50mg  BID for tachycardia CAD, HLD - discontinue  Zocor  40 mg daily  FEN/GI: Cortrak in place PPx: Lovenox  Dispo:Pending GOC discussion and improved PO intake   Subjective:  Patient resting comfortably in bed this AM. Answers questions. States she is doing fine  Objective: Temp:  [98.2 F (36.8 C)-100.1 F (37.8 C)] 98.3 F (36.8 C) (10/16 0743) Pulse Rate:  [101-122] 105 (10/16 0743) Resp:  [18-23] 18 (10/16 0743) BP: (105-118)/(55-76) 114/57 (10/16 0743) SpO2:  [97 %-100 %] 98 % (10/16 0743) Weight:  [62.8 kg] 62.8 kg (10/16 0414)  Physical Exam: General: Resting in bed, thin, NAD Cardiovascular: RRR, no m/r/g appreciated. Pulmonary: Normal WOB. CTAB with no w/c/r present. Abdomen: Soft, non-tender, non-distended. Extremities: Edema to the R arm, radial pulse 2+ Neurologic: A&O x3  Laboratory: Most recent CBC Lab Results  Component Value Date   WBC 9.6 02/22/2024   HGB 9.1 (L) 02/22/2024   HCT 26.9 (L) 02/22/2024   MCV 95.4 02/22/2024   PLT 257 02/22/2024   Most recent BMP    Latest Ref Rng & Units 02/22/2024    3:05 AM  BMP  Glucose 70 - 99 mg/dL 861   BUN 8 - 23 mg/dL 27   Creatinine 9.55 - 1.00 mg/dL 9.42   Sodium 864 - 854 mmol/L 128   Potassium 3.5 - 5.1 mmol/L 4.9   Chloride 98 - 111 mmol/L 94   CO2 22 - 32 mmol/L 26   Calcium 8.9 - 10.3 mg/dL 8.4     Imaging/Diagnostic Tests:  None in 24 hrs  Jerrie Gathers, DO 02/22/2024, 8:42 AM  PGY-1, [facility] FPTS Intern pager: [phone], text pages welcome Secure chat group [facility] Teaching Service

## 2024-02-22 NOTE — Assessment & Plan Note (Addendum)
 Continued swelling of the RUE but DVT negative for clot. - Will continue to monitor

## 2024-02-22 NOTE — Assessment & Plan Note (Addendum)
 Stable, no fevers in 24 hours. Unclear origin. - Bcx -  NG @ 3 days, final results pending - Consider UA if patient continues to fever

## 2024-02-22 NOTE — Plan of Care (Signed)
  Problem: Education: Goal: Knowledge of General Education information will improve Description: Including pain rating scale, medication(s)/side effects and non-pharmacologic comfort measures Outcome: Progressing   Problem: Nutrition: Goal: Adequate nutrition will be maintained Outcome: Progressing  Cortrack out. Home tomorrow with comfort care

## 2024-02-22 NOTE — Assessment & Plan Note (Addendum)
 Likely 2/2 hyponatremia and advancing dementia. Family would like to focus on comfort care at this point.  - RD/SLP following - Cortrak placed 10/13  - Bladder scans q8hrs, Purewick in place - Strict I/Os - Palliative consulted - They will continue GOC conversations regarding Cortrak removal today

## 2024-02-22 NOTE — Assessment & Plan Note (Signed)
 Chronic, stable with Na 128 - Nephrology previously consulted, signed off - AM CMP

## 2024-02-22 NOTE — Plan of Care (Signed)
   Problem: Activity: Goal: Risk for activity intolerance will decrease Outcome: Not Progressing

## 2024-02-22 NOTE — Progress Notes (Signed)
 Daily Progress Note   Patient Name: Felicia Cross       Date: 02/22/2024 DOB: 1933-04-18  Age: 89 y.o. MRN#: [id number] Attending Physician: Donah Laymon PARAS, MD Primary Care Physician: FORBES Nola Raisin, DO Admit Date: 02/16/2024  Reason for Consultation/Follow-up: Establishing goals of care  Subjective: Minimal interaction r/t dementia but denies complaints, appears comfortable   Length of Stay: 6  Current Medications: Scheduled Meds:   Chlorhexidine  Gluconate Cloth  6 each Topical Daily   enoxaparin  (LOVENOX ) injection  40 mg Subcutaneous Q24H   feeding supplement  237 mL Oral BID BM   feeding supplement (PROSource TF20)  60 mL Per Tube Daily   leptospermum manuka honey  1 Application Topical Daily   memantine   10 mg Per Tube BID   metoprolol  tartrate  25 mg Per Tube BID   polyethylene glycol  17 g Oral Daily   senna  1 tablet Oral Daily    Continuous Infusions:  feeding supplement (OSMOLITE 1.5 CAL) 40 mL/hr at 02/21/24 1945    PRN Meds: guaiFENesin-dextromethorphan  Physical Exam Constitutional:      General: She is not in acute distress.    Appearance: She is ill-appearing.  Pulmonary:     Effort: Pulmonary effort is normal.  Skin:    General: Skin is warm and dry.  Neurological:     Mental Status: She is alert. She is disoriented.     Comments: Minimally verbal             Vital Signs: BP (!) 114/57 (BP Location: Right Arm)   Pulse (!) 105   Temp 98.3 F (36.8 C) (Oral)   Resp 18   Ht 5' 4 (1.626 m)   Wt 62.8 kg   SpO2 98%   BMI 23.76 kg/m  SpO2: SpO2: 98 % O2 Device: O2 Device: Room Air O2 Flow Rate:    Intake/output summary:  Intake/Output Summary (Last 24 hours) at 02/22/2024 1152 Last data filed at 02/22/2024 0439 Gross per 24 hour  Intake 1446  ml  Output 500 ml  Net 946 ml   LBM: Last BM Date : 02/21/24 Baseline Weight: Weight: 70 kg Most recent weight: Weight: 62.8 kg       Palliative Assessment/Data: PPS 40%      Patient Active Problem List   Diagnosis Date Noted   Edema of right upper extremity 02/21/2024   Elevated LFTs 02/21/2024   Elevated procalcitonin 02/19/2024   Fever 02/19/2024   Malnutrition of moderate degree 02/19/2024   Hypoalbuminemia due to protein-calorie malnutrition 02/16/2024   Acute right-sided weakness 02/16/2024   Chronic health problem 02/16/2024   Seizure (HCC) 07/24/2021   Syncope 07/23/2021   Hyponatremia 07/23/2021   Traumatic hematoma of forehead 07/23/2021   Frequent falls 12/29/2020   Mixed hyperlipidemia 12/29/2020   Elbow arthritis 08/26/2020   Vertigo 07/06/2018   B12 deficiency 01/18/2016   Normocytic anemia 01/18/2016   S/P total knee replacement 09/14/2015   Right knee pain 11/21/2014   Carotid arterial disease 05/27/2009   Dyslipidemia 06/27/2008   Coronary atherosclerosis 11/06/2006   Essential hypertension 03/07/2006   Osteoarthritis 03/07/2006    Palliative Care Assessment & Plan   HPI: 89 year old with  a history of dementia, metabolic syndrome, left-sided deficits from contractures who presents with decreased responsiveness and hypotension found to have hyponatremia. Palliative care has been asked to support additional goals of care conversations in the setting of dementia.    Patient does not have medical decision-making capacity at this time.    Patient is a member of [facility] program/ they are aware of this hospitalization.   Family face treatment option decision, advanced directive decisions and anticipatory care needs.  Assessment: Follow up today with daughter [name] who is patient's primary HCPOA.  On eval x2 today, patient seen with improved intake. While it remains reduced, it has improved from previous days. Drank 2 containers of liquids during  breakfast and also consumed a few bites. Observing eating soup and drinking (being fed by NT) during lunch. Also continues to receive nutrition support by cortak.   With daughter we review patient's status. Patient lives with her spouse who has Parkinsons. She receives care 7 days/week from [facility]. Daughter [name] (primary HCPOA) is staying in town for now but actually lives in [place]. She has a daughter [name] who lives closer and also has a son.   Discuss with [name] dementia trajectory - she understands this. Discuss that patient has perked up and is consuming more. We discuss that she is frail and is at risk for decline. We review that as her dementia progresses it is expected that intake will decline again. We discuss uncertain prognosis but time is limited. [name] understands and agrees.  We discuss removal of cortrak now that intake somewhat improved - [name] agrees.  We review ultimate goals - focus on comfort and avoid repeat hospitalizations. Discharge with support of [facility] to focus on comfort.   I completed a MOST form today. Daughter [name] outlined their wishes for the following treatment decisions:  Cardiopulmonary Resuscitation: Do Not Attempt Resuscitation (DNR/No CPR)  Medical Interventions: Comfort Measures: Keep clean, warm, and dry. Use medication by any route, positioning, wound care, and other measures to relieve pain and suffering. Use oxygen, suction and manual treatment of airway obstruction as needed for comfort. Do not transfer to the hospital unless comfort needs cannot be met in current location.  Antibiotics: Determine use of limitation of antibiotics when infection occurs  IV Fluids: No IV fluids (provide other measures to ensure comfort)  Feeding Tube: No feeding tube    This was signed, copies placed on chart and given to [name].   We review options for discharge. [name] would like patient to come home but fears patient dying in the home - 2 nephews live with them and  she fears how this would affect them. We discuss patient discharging home initially and [facility] can monitor and when much closer to end of life can transition to facility. I called [facility] social worker Clarita who confirms this is feasible and [facility] would support this plan. Discussed with [name] that is certainly possible she could have unexpected death but we would expect a decline and her caregivers would be able to recognize when transition to facility is appropriate. [name] accepts this.   Discussed plan to focus on comfort, remove cortrak, and plan for dc home with [facility] tomorrow with healthcare team. All agree.   Recommendations/Plan: Dc cortrak MOST completed as above Focus on comfort Dc home with [facility] with plan to focus on comfort, no more hospitalizations Plan with [facility] to transition to facility when she is approaching end of life to avoid passing away in the home which is family's  preference  Code Status: DNR  Care plan was discussed with attending physician, RN, daughter [name], [facility] social worker Clarita  Thank you for allowing the Palliative Medicine Team to assist in the care of this patient.   Total Time 70 minutes Prolonged Time Billed  yes   Time spent includes: Detailed review of medical records (labs, imaging, vital signs), medically appropriate exam, discussion with treatment team, counseling and educating patient, family and/or staff, documenting clinical information, medication management and coordination of care.     *Please note that this is a verbal dictation therefore any spelling or grammatical errors are due to the Dragon Medical One system interpretation.  Tobey Jama Barnacle, DNP, [facility] Palliative Medicine Team Team Phone # [phone]  Pager [phone]

## 2024-02-23 DIAGNOSIS — G309 Alzheimer's disease, unspecified: Secondary | ICD-10-CM | POA: Diagnosis not present

## 2024-02-23 DIAGNOSIS — E871 Hypo-osmolality and hyponatremia: Secondary | ICD-10-CM | POA: Diagnosis not present

## 2024-02-23 DIAGNOSIS — Z515 Encounter for palliative care: Secondary | ICD-10-CM | POA: Diagnosis not present

## 2024-02-23 DIAGNOSIS — E440 Moderate protein-calorie malnutrition: Secondary | ICD-10-CM | POA: Diagnosis not present

## 2024-02-23 MED ORDER — MEDIHONEY WOUND/BURN DRESSING EX PSTE: 1.0000 | PASTE | Freq: Every day | CUTANEOUS | Status: DC

## 2024-02-23 MED ORDER — ENSURE PLUS HIGH PROTEIN PO LIQD: 237.0000 mL | Freq: Two times a day (BID) | ORAL | Status: DC

## 2024-02-23 NOTE — Assessment & Plan Note (Deleted)
 Likely 2/2 hyponatremia and advancing dementia. Family would like to focus on comfort care at this point.  - RD/SLP following - Cortrak placed 10/13  - Bladder scans q8hrs, Purewick in place - Strict I/Os - Palliative consulted - They will continue GOC conversations regarding Cortrak removal today

## 2024-02-23 NOTE — Assessment & Plan Note (Deleted)
 Stable, no fevers in 24 hours. Unclear origin. - Bcx -  NG @ 3 days, final results pending - Consider UA if patient continues to fever

## 2024-02-23 NOTE — Assessment & Plan Note (Deleted)
 Dementia - continue Namenda  10mg  BID HTN - Continue home Spironolactone  25 mg daily, Losartan  100 mg; Increase Metoprolol  tartrate to 50mg  BID for tachycardia CAD, HLD - discontinue  Zocor  40 mg daily

## 2024-02-23 NOTE — Assessment & Plan Note (Deleted)
 Continued swelling of the RUE but DVT negative for clot. - Will continue to monitor

## 2024-02-23 NOTE — Progress Notes (Signed)
 Daily Progress Note   Patient Name: Felicia Cross       Date: 02/23/2024 DOB: 04-Mar-1933  Age: 89 y.o. MRN#: [id number] Attending Physician: Donah Laymon PARAS, MD Primary Care Physician: FORBES Nola Raisin, DO Admit Date: 02/16/2024  Reason for Consultation/Follow-up: Establishing goals of care  Subjective: Minimal interaction r/t dementia but denies complaints, appears comfortable   Length of Stay: 7  Current Medications: Scheduled Meds:   Chlorhexidine  Gluconate Cloth  6 each Topical Daily   enoxaparin  (LOVENOX ) injection  40 mg Subcutaneous Q24H   feeding supplement  237 mL Oral BID BM   leptospermum manuka honey  1 Application Topical Daily   memantine   10 mg Oral BID   metoprolol  tartrate  50 mg Oral BID   polyethylene glycol  17 g Oral Daily   senna  1 tablet Oral Daily    Continuous Infusions:    PRN Meds:   Physical Exam Constitutional:      General: She is not in acute distress.    Appearance: She is ill-appearing.  Pulmonary:     Effort: Pulmonary effort is normal.  Skin:    General: Skin is warm and dry.  Neurological:     Mental Status: She is alert. She is disoriented.     Comments: Minimally verbal             Vital Signs: BP 117/63   Pulse (!) 107   Temp 98.3 F (36.8 C)   Resp 16   Ht 5' 4 (1.626 m)   Wt 62.8 kg   SpO2 97%   BMI 23.76 kg/m  SpO2: SpO2: 97 % O2 Device: O2 Device: Room Air O2 Flow Rate:    Intake/output summary:  Intake/Output Summary (Last 24 hours) at 02/23/2024 1314 Last data filed at 02/22/2024 1932 Gross per 24 hour  Intake 688 ml  Output 100 ml  Net 588 ml   LBM: Last BM Date : 02/23/24 Baseline Weight: Weight: 70 kg Most recent weight: Weight: 62.8 kg       Palliative Assessment/Data: PPS 40%      Patient  Active Problem List   Diagnosis Date Noted   Edema of right upper extremity 02/21/2024   Elevated LFTs 02/21/2024   Elevated procalcitonin 02/19/2024   Fever 02/19/2024   Malnutrition of moderate degree 02/19/2024   Hypoalbuminemia due to protein-calorie malnutrition 02/16/2024   Acute right-sided weakness 02/16/2024   Chronic health problem 02/16/2024   Seizure (HCC) 07/24/2021   Syncope 07/23/2021   Hyponatremia 07/23/2021   Traumatic hematoma of forehead 07/23/2021   Frequent falls 12/29/2020   Mixed hyperlipidemia 12/29/2020   Elbow arthritis 08/26/2020   Vertigo 07/06/2018   B12 deficiency 01/18/2016   Normocytic anemia 01/18/2016   S/P total knee replacement 09/14/2015   Right knee pain 11/21/2014   Carotid arterial disease 05/27/2009   Dyslipidemia 06/27/2008   Coronary atherosclerosis 11/06/2006   Essential hypertension 03/07/2006   Osteoarthritis 03/07/2006    Palliative Care Assessment & Plan   HPI: 89 year old with a history of dementia, metabolic syndrome, left-sided deficits from contractures who presents with decreased responsiveness and hypotension found to have hyponatremia. Palliative care has been asked to support additional goals of  care conversations in the setting of dementia.    Patient does not have medical decision-making capacity at this time.    Patient is a member of [facility] program/ they are aware of this hospitalization.   Family face treatment option decision, advanced directive decisions and anticipatory care needs.  Assessment: Follow up today with daughter [name] who is patient's primary HCPOA.Also her home caregiver is at bedside.   Cortrak has been removed. Patient appears comfortable, no distress.   [name] asks about how her care will change at home - we discuss that it will largely be unchanged until she has a decline again at which point focus would be on her comfort and avoiding repeat hospitalizations. We discuss [facility] team is aware of  wishes and will help facilitate. We discuss allowing patient to eat what she is able but not forcing.   Discuss with [name] dementia trajectory - she understands this. Discuss that patient has perked up and is consuming more. We discuss that she is frail and is at risk for decline. We review that as her dementia progresses it is expected that intake will decline again. We discuss uncertain prognosis but time is limited. [name] understands and agrees.   Recommendations/Plan: Focus on comfort Dc home today with [facility] with plan to focus on comfort, no more hospitalizations Plan with [facility] to transition to facility when she is approaching end of life to avoid passing away in the home which is family's preference  Code Status: DNR  Care plan was discussed with attending physician, daughter [name]  Thank you for allowing the Palliative Medicine Team to assist in the care of this patient.   Total Time 25 minutes Prolonged Time Billed  No   Time spent includes: Detailed review of medical records (labs, imaging, vital signs), medically appropriate exam, discussion with treatment team, counseling and educating patient, family and/or staff, documenting clinical information, medication management and coordination of care.     *Please note that this is a verbal dictation therefore any spelling or grammatical errors are due to the Dragon Medical One system interpretation.  Tobey Jama Barnacle, DNP, [facility] Palliative Medicine Team Team Phone # [phone]  Pager [phone]

## 2024-02-23 NOTE — Assessment & Plan Note (Deleted)
 Hgb 9.1, stable. Likely 2/2 blood draws, poor nutrition and mIVF.  - AM CBC

## 2024-02-23 NOTE — Plan of Care (Signed)

## 2024-02-23 NOTE — TOC Transition Note (Signed)
 Transition of Care [facility]) - Discharge Note   Patient Details  Name: Felicia Cross MRN: [id number] Date of Birth: 08-31-1932  [facility] CM/SW Contact:  Tom-Johnson, Harvest Muskrat, RN Phone Number: 02/23/2024, 11:34 AM   Clinical Narrative:     Patient is scheduled for discharge today.  Readmission Risk Assessment done. Hospital f/u and discharge instructions on AVS. CM spoke with Clarita, SW with [facility]. Clarita states [facility] will provide comfort measures for patient at home and work on getting patient to a SNF with Hospice or residential Hospice.  Patient's daughter, [name] at bedside and endorsed plan.  [facility] will transport at discharge.  No further ICM needs noted.      Final next level of care: Home/Self Care ([facility]) Barriers to Discharge: Barriers Resolved   Patient Goals and CMS Choice Patient states their goals for this hospitalization and ongoing recovery are:: To return home CMS Medicare.gov Compare Post Acute Care list provided to:: Patient Choice offered to / list presented to : Adult Children (Daughter, [name])      Discharge Placement                Patient to be transferred to facility by: [facility]      Discharge Plan and Services Additional resources added to the After Visit Summary for                  DME Arranged: N/A DME Agency: NA       HH Arranged: NA HH Agency: NA        Social Drivers of Health (SDOH) Interventions SDOH Screenings   Food Insecurity: No Food Insecurity (02/16/2024)  Housing: Low Risk  (02/16/2024)  Transportation Needs: No Transportation Needs (02/16/2024)  Utilities: Not At Risk (02/16/2024)  Depression (PHQ2-9): Low Risk  (01/06/2023)  Financial Resource Strain: Low Risk  (12/13/2021)  Physical Activity: Inactive (12/13/2021)  Social Connections: Moderately Integrated (02/16/2024)  Stress: No Stress Concern Present (12/13/2021)  Tobacco Use: Medium Risk (02/16/2024)      Readmission Risk Interventions    02/23/2024   11:32 AM  Readmission Risk Prevention Plan  Transportation Screening Complete  PCP or Specialist Appt within 5-7 Days Complete  Home Care Screening Complete  Medication Review (RN CM) Referral to Pharmacy

## 2024-02-23 NOTE — Assessment & Plan Note (Deleted)
 Chronic, stable with Na 128 - Nephrology previously consulted, signed off - AM CMP

## 2024-02-23 NOTE — Assessment & Plan Note (Deleted)
 AST 112 > 104, ALT 114 > 118 which are 3x the upper limit of normal. Hepatitis panel negative. Differential includes tube feeds vs Ceftriaxone. Will continue to trend. - AM CMP - Consider ultrasound  if LFTs continue trending upwards - Holding home Simvastatin  for now

## 2024-02-23 NOTE — Discharge Summary (Addendum)
 Family Medicine Teaching [facility] Discharge Summary  Patient name: Felicia Cross Medical record number: [id number] Date of birth: 07-28-1932 Age: 89 y.o. Gender: female Date of Admission: 02/16/2024  Date of Discharge: 02/23/24 Admitting Physician: Elio Alena Morrison, MD  Primary Care Provider: FORBES Nola Raisin, DO Consultants: Nephrology, pulmonology, ICU  Indication for Hospitalization: Hyponatremia, weakness  Discharge Diagnoses/Problem List:  Principal Problem for Admission: Hyponatremia, malnutrition Other Problems addressed during stay:  Principal Problem:   Hyponatremia Active Problems:   Normocytic anemia   Hypoalbuminemia due to protein-calorie malnutrition   Acute right-sided weakness   Chronic health problem   Elevated procalcitonin   Fever   Malnutrition of moderate degree   Edema of right upper extremity   Elevated LFTs  Brief Hospital Course:  Felicia Cross is a 89 y.o.female with a history of CAD, dementia, HTN, HLD who was admitted to the [facility] Medicine Teaching Service at [facility] for hyponatremia and weakness. Her hospital course is detailed below:  Hyponatremia Chronic. Patient presented with acute decreased responsivness. Found to have Na 121. Nephrology was consulted for assistance with treatment and evaluation. Na continued to drop to 119 despite treatment with fluids. Patient was transferred to ICU for administration of 3% saline.  Back to floor once stable. Na stable upon discharge.  Hypoalbuminemia due to protein-calorie malnutrition Acute right-sided weakness  Likely secondary to hyponatremia and advancing dementia. Patient with regular diet prior to admission, not requiring tube feeds. NGT initially placed, later transitioned to Cortrak. While patient did seem more comfortable with Cortrak, PO intake was still an ongoing concern. Palliative was consulted who found the patient has an advanced directive stating she would not want  artificial nutrition or hydration in the event of an illness which would not improve. After discussing goals of care with family, decision was made to focus on comfort measures with PACE. Cortrak was removed prior to discharge.  Elevated procalcitonin Fever Patient was found to have elevated procalcitonin and started on a course of Ceftriaxone. While being treated, patient was febrile to 100.5 and 100.8. Repeat Bcx showed no growth at 4 days, final results pending. Respiratory panel was negative and CXR did not show acute infectious process. Unknown source and patient remained afebrile for the remainder of admission.  Edema of RUE Edema of the right upper extremity noted on 10/15. RUE venous US  was ordered and was negative for DVT. Likely 2/2 IV use. Edema still present at time of discharge.  Transaminitis Labs showed AST/ALT elevated to 3x the upper limit of normal. Workup included hepatitis panel which was negative. Suspect this is likely due to tube feeds or Rocephin which have since been stopped.  Normocytic anemia Hgb 8.9 which improved. Suspect this is likely secondary to blood draws, poor nutrition and IV fluids.  Other chronic conditions were medically managed with home medications and formulary alternatives as necessary (dementia, HTN, CAD, HLD)  PCP Follow-up Recommendations:  Incidental 6 mm right middle lobe pulmonary nodule. Recommend non-contrast chest CT in 6-12 months     Results/Tests Pending at Time of Discharge:  Unresulted Labs (From admission, onward)    None        Disposition: Home with PACE, focus on comfort measures  Discharge Condition: Stable  Discharge Exam:  Vitals:   02/23/24 0449 02/23/24 0835  BP: 131/76 117/63  Pulse: (!) 101 (!) 107  Resp: 18 16  Temp: 98.4 F (36.9 C) 98.3 F (36.8 C)  SpO2: 97% 97%   General: Laying comfortably  in bed, awake, in NAD. Responds with 1-2 words. Cardiovascular: RRR, no m/r/g appreciated. Pulmonary:  Normal WOB. CTAB with no w/c/r present. Abdomen: Soft, non-tender, non-distended. Extremities: Edem ato the R arm, radial pulse 2+  Significant Procedures:   VAS US  UPPER EXTREMITY VENOUS DUPLEX Result Date: 02/21/2024 Summary:  Right: No evidence of deep vein thrombosis in the upper extremity. No evidence of superficial vein thrombosis in the upper extremity.  Left: No evidence of thrombosis in the subclavian.     Significant Labs and Imaging:  Recent Labs  Lab 02/22/24 0305  WBC 9.6  HGB 9.1*  HCT 26.9*  PLT 257   Recent Labs  Lab 02/22/24 0305  NA 128*  K 4.9  CL 94*  CO2 26  GLUCOSE 138*  BUN 27*  CREATININE 0.57  CALCIUM 8.4*  ALKPHOS 49  AST 104*  ALT 118*  ALBUMIN 2.2*    Pertinent Imaging  DG CHEST PORT 1 VIEW Result Date: 02/19/2024 1. Partial obscuration of the right hemidiaphragm may be due to scarring or small pleural effusion in the setting of elevated hemidiaphragm. 2. Minor atelectasis in the retrocardiac left lung base.   DG Swallowing Func-Speech Pathology Result Date: 02/19/2024 Clinical Impression: Pt exhibits mild oral dysphagia characterized by intermittent oral holding primarily with smaller boluses. Of note, large-bore NGT was replaced with a Cortrak prior to this MBS, which is suspected to have positively impacted her presentation. Despite being less alert during the study than during previous visit this date, she consistently protected her airway. Transient penetration occurred with thin liquids when taking the 13 mm barium tablet (PAS 2) but this is considered WFL and did not recur. Coughing was observed x1 after swallowing large volumes of thin liquids, though this did not represent aspiration. Once a swallow was initiated, oropharyngeal clearance was complete without any residue and the tablet passed through the esophagus quickly. Recommend resuming Dys 3 solids and thin liquids. Meds can be given whole with liquid. SLP will f/u at least briefly  but do not anticipate post-acute needs.  DG Abd 1 View Result Date: 02/17/2024  1. Satisfactory Enteric tube placement in the stomach now.   DG Abd 1 View Result Date: 02/17/2024 Gastric catheter as described. This should be advanced deeper into the stomach.   MR BRAIN WO CONTRAST  1. Unremarkable brain MRI.   CT HEAD WO CONTRAST Result Date: 02/16/2024 Generalized cerebral atrophy and microvascular disease changes of the supratentorial brain. 2. No acute intracranial abnormality.  CT ABDOMEN PELVIS W CONTRAST Result Date: 02/16/2024 1. Incidental 6 mm right middle lobe pulmonary nodule. Recommend non-contrast chest CT in 612 months; if stable, consider follow-up at 1824 months based on risk. 2. Simple left renal cyst. 3. Status post right total hip arthroplasty. 4. Atherosclerosis of the abdominal aorta.   DG Chest 2 View Result Date: 02/16/2024 1. Hypoinflation of the lungs. 2. No acute cardiopulmonary process.   Discharge Medications:  Allergies as of 02/23/2024       Reactions   Codeine Nausea And Vomiting, Nausea Only   Hydrocodone Nausea And Vomiting        Medication List     PAUSE taking these medications    losartan  100 MG tablet Wait to take this until your doctor or other care provider tells you to start again. Commonly known as: COZAAR  Take 1 tablet (100 mg total) by mouth daily.   simvastatin  40 MG tablet Wait to take this until your doctor or other care provider tells  you to start again. Commonly known as: ZOCOR  Take 1 tablet (40 mg total) by mouth daily.   spironolactone  25 MG tablet Wait to take this until your doctor or other care provider tells you to start again. Commonly known as: ALDACTONE  Take 1 tablet by mouth once daily       TAKE these medications    acetaminophen  325 MG tablet Commonly known as: TYLENOL  Take 975 mg by mouth See admin instructions. Take 3 tablets (975 mg) by mouth three times daily, may also take 2 tablets (650  mg) by mouth every 6 hours as needed for moderate pain/headache   Blood Pressure Cuff Misc Use as directed for checking bp daily.   feeding supplement Liqd Take 237 mLs by mouth 2 (two) times daily between meals.   leptospermum manuka honey Pste paste Apply 1 Application topically daily.   memantine  10 MG tablet Commonly known as: NAMENDA  TAKE 1 TABLET BY MOUTH TWICE DAILY**MUST KEEP UPCOMING APPOINTMENT WITH NP 07/24/2023** What changed: See the new instructions.   metoprolol  tartrate 25 MG tablet Commonly known as: LOPRESSOR  Take 1 tablet (25 mg total) by mouth 2 (two) times daily. What changed: when to take this   Multiple Vitamin-Folic Acid  Tabs Take 1 tablet by mouth every morning.               Discharge Care Instructions  (From admission, onward)           Start     Ordered   02/23/24 0000  Discharge wound care:       Comments: Wound care  Until discontinued      Comments: L elbow: Cleanse with saline pat dry the peri-wound skin. Apply Medihoney daily into the wound bed, cover with foam dressing. The foam can stay up to 3 days if not saturated or soiling. Ok to lift and reapply the Medihoney.  02/17/24 0630     02/17/24 0629    Wound care  Until discontinued      Comments: Sacrum: Cleanse with Vashe #[id number], not rinse, pat dry the peri-wound skin. Apply Medihoney daily into the wound bed, cover with foam dressing. The foam can stay up to 3 days if not saturated or soiling. Ok to lift and reapply the Medihoney.   02/23/24 1046            Discharge Instructions: Please refer to Patient Instructions section of EMR for full details.  Patient was counseled important signs and symptoms that should prompt return to medical care, changes in medications, dietary instructions, activity restrictions, and follow up appointments.   Follow-Up Appointments:   Jerrie Gathers, DO 02/23/2024, 11:22 AM PGY-1, [facility]  Upper Level  Attestation I have seen and examined the patient with the resident. I agree with the history, physical, and assessment. The patient is appropriate for discharge.  Lucie Pinal, DO PGY-2, Family Medicine

## 2024-02-24 LAB — CULTURE, BLOOD (ROUTINE X 2)
Culture: NO GROWTH
Culture: NO GROWTH
Special Requests: ADEQUATE
Special Requests: ADEQUATE

## 2024-03-28 ENCOUNTER — Ambulatory Visit: Admitting: Adult Health

## 2024-04-27 ENCOUNTER — Inpatient Hospital Stay (HOSPITAL_COMMUNITY): Payer: Medicare (Managed Care)

## 2024-04-27 ENCOUNTER — Inpatient Hospital Stay (HOSPITAL_COMMUNITY)
Admission: EM | Admit: 2024-04-27 | Discharge: 2024-04-28 | DRG: 872 | Disposition: A | Discharge status: Hospice | Payer: Medicare (Managed Care) | Source: Skilled Nursing Facility

## 2024-04-27 ENCOUNTER — Emergency Department (HOSPITAL_COMMUNITY): Payer: Medicare (Managed Care)

## 2024-04-27 ENCOUNTER — Encounter (HOSPITAL_COMMUNITY): Payer: Self-pay

## 2024-04-27 ENCOUNTER — Other Ambulatory Visit: Payer: Self-pay

## 2024-04-27 DIAGNOSIS — R000 Tachycardia, unspecified: Secondary | ICD-10-CM | POA: Diagnosis not present

## 2024-04-27 DIAGNOSIS — M7989 Other specified soft tissue disorders: Secondary | ICD-10-CM | POA: Diagnosis present

## 2024-04-27 DIAGNOSIS — I10 Essential (primary) hypertension: Secondary | ICD-10-CM | POA: Diagnosis present

## 2024-04-27 DIAGNOSIS — Z96642 Presence of left artificial hip joint: Secondary | ICD-10-CM | POA: Diagnosis present

## 2024-04-27 DIAGNOSIS — S0081XA Abrasion of other part of head, initial encounter: Secondary | ICD-10-CM | POA: Diagnosis present

## 2024-04-27 DIAGNOSIS — I2510 Atherosclerotic heart disease of native coronary artery without angina pectoris: Secondary | ICD-10-CM | POA: Diagnosis present

## 2024-04-27 DIAGNOSIS — Z7401 Bed confinement status: Secondary | ICD-10-CM

## 2024-04-27 DIAGNOSIS — E785 Hyperlipidemia, unspecified: Secondary | ICD-10-CM | POA: Diagnosis present

## 2024-04-27 DIAGNOSIS — R509 Fever, unspecified: Secondary | ICD-10-CM | POA: Diagnosis present

## 2024-04-27 DIAGNOSIS — A419 Sepsis, unspecified organism: Principal | ICD-10-CM | POA: Diagnosis present

## 2024-04-27 DIAGNOSIS — M159 Polyosteoarthritis, unspecified: Secondary | ICD-10-CM | POA: Diagnosis present

## 2024-04-27 DIAGNOSIS — Z96653 Presence of artificial knee joint, bilateral: Secondary | ICD-10-CM | POA: Diagnosis present

## 2024-04-27 DIAGNOSIS — Z515 Encounter for palliative care: Secondary | ICD-10-CM

## 2024-04-27 DIAGNOSIS — D72829 Elevated white blood cell count, unspecified: Secondary | ICD-10-CM

## 2024-04-27 DIAGNOSIS — E538 Deficiency of other specified B group vitamins: Secondary | ICD-10-CM | POA: Diagnosis present

## 2024-04-27 DIAGNOSIS — I4891 Unspecified atrial fibrillation: Secondary | ICD-10-CM | POA: Diagnosis present

## 2024-04-27 DIAGNOSIS — Z885 Allergy status to narcotic agent status: Secondary | ICD-10-CM | POA: Diagnosis not present

## 2024-04-27 DIAGNOSIS — S31000A Unspecified open wound of lower back and pelvis without penetration into retroperitoneum, initial encounter: Secondary | ICD-10-CM | POA: Diagnosis present

## 2024-04-27 DIAGNOSIS — Z90710 Acquired absence of both cervix and uterus: Secondary | ICD-10-CM | POA: Diagnosis not present

## 2024-04-27 DIAGNOSIS — Z7189 Other specified counseling: Secondary | ICD-10-CM | POA: Diagnosis not present

## 2024-04-27 DIAGNOSIS — F0280 Dementia in other diseases classified elsewhere without behavioral disturbance: Secondary | ICD-10-CM | POA: Diagnosis present

## 2024-04-27 DIAGNOSIS — Z79899 Other long term (current) drug therapy: Secondary | ICD-10-CM | POA: Diagnosis not present

## 2024-04-27 DIAGNOSIS — G309 Alzheimer's disease, unspecified: Secondary | ICD-10-CM | POA: Diagnosis present

## 2024-04-27 DIAGNOSIS — Z66 Do not resuscitate: Secondary | ICD-10-CM | POA: Diagnosis present

## 2024-04-27 DIAGNOSIS — E871 Hypo-osmolality and hyponatremia: Secondary | ICD-10-CM | POA: Diagnosis present

## 2024-04-27 DIAGNOSIS — F0390 Unspecified dementia without behavioral disturbance: Secondary | ICD-10-CM | POA: Diagnosis not present

## 2024-04-27 LAB — CBC WITH DIFFERENTIAL/PLATELET
Abs Immature Granulocytes: 0.07 K/uL (ref 0.00–0.07)
Basophils Absolute: 0.1 K/uL (ref 0.0–0.1)
Basophils Relative: 0 %
Eosinophils Absolute: 0.1 K/uL (ref 0.0–0.5)
Eosinophils Relative: 0 %
HCT: 28.9 % — ABNORMAL LOW (ref 36.0–46.0)
Hemoglobin: 8.9 g/dL — ABNORMAL LOW (ref 12.0–15.0)
Immature Granulocytes: 1 %
Lymphocytes Relative: 10 %
Lymphs Abs: 1.5 K/uL (ref 0.7–4.0)
MCH: 27.4 pg (ref 26.0–34.0)
MCHC: 30.8 g/dL (ref 30.0–36.0)
MCV: 88.9 fL (ref 80.0–100.0)
Monocytes Absolute: 1.2 K/uL — ABNORMAL HIGH (ref 0.1–1.0)
Monocytes Relative: 8 %
Neutro Abs: 11.5 K/uL — ABNORMAL HIGH (ref 1.7–7.7)
Neutrophils Relative %: 81 %
Platelets: 442 K/uL — ABNORMAL HIGH (ref 150–400)
RBC: 3.25 MIL/uL — ABNORMAL LOW (ref 3.87–5.11)
RDW: 15.7 % — ABNORMAL HIGH (ref 11.5–15.5)
WBC: 14.3 K/uL — ABNORMAL HIGH (ref 4.0–10.5)
nRBC: 0 % (ref 0.0–0.2)

## 2024-04-27 LAB — I-STAT CG4 LACTIC ACID, ED: Lactic Acid, Venous: 1.9 mmol/L (ref 0.5–1.9)

## 2024-04-27 LAB — RESPIRATORY PANEL BY PCR

## 2024-04-27 LAB — URINALYSIS, W/ REFLEX TO CULTURE (INFECTION SUSPECTED)
Bilirubin Urine: NEGATIVE
Glucose, UA: NEGATIVE mg/dL
Hgb urine dipstick: NEGATIVE
Ketones, ur: NEGATIVE mg/dL
Leukocytes,Ua: NEGATIVE
Nitrite: NEGATIVE
Protein, ur: NEGATIVE mg/dL
Specific Gravity, Urine: 1.01 (ref 1.005–1.030)
pH: 6 (ref 5.0–8.0)

## 2024-04-27 LAB — RESP PANEL BY RT-PCR (RSV, FLU A&B, COVID)  RVPGX2
Influenza A by PCR: NEGATIVE
Influenza B by PCR: NEGATIVE
Resp Syncytial Virus by PCR: NEGATIVE
SARS Coronavirus 2 by RT PCR: NEGATIVE

## 2024-04-27 LAB — COMPREHENSIVE METABOLIC PANEL WITH GFR
ALT: 18 U/L (ref 0–44)
AST: 54 U/L — ABNORMAL HIGH (ref 15–41)
Albumin: 3 g/dL — ABNORMAL LOW (ref 3.5–5.0)
Alkaline Phosphatase: 79 U/L (ref 38–126)
Anion gap: 11 (ref 5–15)
BUN: 21 mg/dL (ref 8–23)
CO2: 27 mmol/L (ref 22–32)
Calcium: 9.4 mg/dL (ref 8.9–10.3)
Chloride: 93 mmol/L — ABNORMAL LOW (ref 98–111)
Creatinine, Ser: 0.58 mg/dL (ref 0.44–1.00)
GFR, Estimated: >60 mL/min
Glucose, Bld: 102 mg/dL — ABNORMAL HIGH (ref 70–99)
Potassium: 4 mmol/L (ref 3.5–5.1)
Sodium: 130 mmol/L — ABNORMAL LOW (ref 135–145)
Total Bilirubin: 0.4 mg/dL (ref 0.0–1.2)
Total Protein: 6.4 g/dL — ABNORMAL LOW (ref 6.5–8.1)

## 2024-04-27 LAB — PROTIME-INR
INR: 1 (ref 0.8–1.2)
Prothrombin Time: 14 s (ref 11.4–15.2)

## 2024-04-27 MED ORDER — LACTATED RINGERS IV SOLN: INTRAVENOUS | Status: DC

## 2024-04-27 MED ORDER — VANCOMYCIN HCL 750 MG/150ML IV SOLN: 750.0000 mg | INTRAVENOUS | Status: DC

## 2024-04-27 MED ORDER — HEPARIN (PORCINE) 25000 UT/250ML-% IV SOLN
850.0000 [IU]/h | INTRAVENOUS | Status: DC
  Administered 2024-04-28: 850 [IU]/h via INTRAVENOUS
  Filled 2024-04-27: qty 250

## 2024-04-27 MED ORDER — SODIUM CHLORIDE 0.9 % IV SOLN
2.0000 g | Freq: Once | INTRAVENOUS | Status: AC
  Administered 2024-04-27: 2 g via INTRAVENOUS
  Filled 2024-04-27: qty 12.5

## 2024-04-27 MED ORDER — LACTATED RINGERS IV BOLUS (SEPSIS)
1000.0000 mL | Freq: Once | INTRAVENOUS | Status: AC
  Administered 2024-04-27: 1000 mL via INTRAVENOUS

## 2024-04-27 MED ORDER — METOPROLOL TARTRATE 5 MG/5ML IV SOLN: 2.5000 mg | Freq: Once | INTRAVENOUS | Status: DC

## 2024-04-27 MED ORDER — SODIUM CHLORIDE 0.9 % IV SOLN: 2.0000 g | Freq: Two times a day (BID) | INTRAVENOUS | Status: DC

## 2024-04-27 MED ORDER — IOHEXOL 350 MG/ML SOLN
75.0000 mL | Freq: Once | INTRAVENOUS | Status: AC | PRN
  Administered 2024-04-27: 75 mL via INTRAVENOUS

## 2024-04-27 MED ORDER — VANCOMYCIN HCL IN DEXTROSE 1-5 GM/200ML-% IV SOLN
1000.0000 mg | Freq: Once | INTRAVENOUS | Status: AC
  Administered 2024-04-27: 1000 mg via INTRAVENOUS
  Filled 2024-04-27: qty 200

## 2024-04-27 MED ORDER — ACETAMINOPHEN 650 MG RE SUPP
650.0000 mg | Freq: Four times a day (QID) | RECTAL | Status: DC | PRN
  Administered 2024-04-28: 650 mg via RECTAL
  Filled 2024-04-27: qty 1

## 2024-04-27 MED ORDER — MUPIROCIN 2 % EX OINT
TOPICAL_OINTMENT | Freq: Two times a day (BID) | CUTANEOUS | Status: DC
  Filled 2024-04-27: qty 22

## 2024-04-27 MED ORDER — ENOXAPARIN SODIUM 40 MG/0.4ML IJ SOSY: 40.0000 mg | PREFILLED_SYRINGE | INTRAMUSCULAR | Status: DC

## 2024-04-27 MED ORDER — KETOROLAC TROMETHAMINE 15 MG/ML IJ SOLN
15.0000 mg | Freq: Once | INTRAMUSCULAR | Status: AC
  Administered 2024-04-27: 15 mg via INTRAVENOUS
  Filled 2024-04-27: qty 1

## 2024-04-27 NOTE — Assessment & Plan Note (Signed)
 Chronic sacral wound. No external signs of acute infection on exam. - CT C/A/P as above to further investigate for deeper infection  - Cefepime  and Vancomycin  per pharmacy as above  - Wound care consult placed

## 2024-04-27 NOTE — Hospital Course (Signed)
 This is a an 89 year old female with PMH of dementia, anemia, hypertension who was admitted to the [facility] family medicine teaching service with fever of unknown origin, meeting sepsis criteria.  Her hospital course is detailed below:  Fever of unknown origin Patient's daughter noted facial abrasion and right hand swelling at SNF, uncertain what the cause was, so she brought her mother to the ED where she was found to be febrile and meeting septic criteria due to fever, tachycardia, leukocytosis.  CT chest/abdomen/pelvis and CT head revealing of infection source.  Chest x-ray and right hand x-ray difficult to fully assess due to patient positioning however no clear bony injury or source of infection.  RVP negative.  Patient given empiric cefepime  and vancomycin , IV fluids, pain control.  Troponins elevated, suspected due to demand ischemia in the setting of tachycardia and sepsis.  EKG without ST changes, plan to trend troponin and patient on heparin  drip.  Critical hemoglobin of 5.1 and sodium of 121.  Discussed with daughter, healthcare POA, at bedside role of blood transfusion and plan for further workup.  Given overall prognosis and recommended medical interventions, HCPOA discussed with family and elected to transition to comfort care at this time.  Palliative care consulted, and patient discharged to [facility] on 04/28/24.

## 2024-04-27 NOTE — Sepsis Progress Note (Signed)
 Elink following code sepsis

## 2024-04-27 NOTE — Assessment & Plan Note (Signed)
 Superficial skin abrasion of L face, does not appear affected at this time. CT maxillofacial without acute fracture.  - Mupirocin  ointment BID

## 2024-04-27 NOTE — Progress Notes (Signed)
 PHARMACY - ANTICOAGULATION CONSULT NOTE  Pharmacy Consult for heparin  Indication: atrial fibrillation  Allergies[1]  Patient Measurements: Height: 5' 4 (162.6 cm) Weight: 62.6 kg (138 lb) IBW/kg (Calculated) : 54.7 HEPARIN  DW (KG): 62.6  Vital Signs: Temp: 98 F (36.7 C) (12/20 2112) Temp Source: Axillary (12/20 2112) BP: 187/90 (12/20 2000) Pulse Rate: 120 (12/20 2000)  Labs: Recent Labs    04/27/24 1727  HGB 8.9*  HCT 28.9*  PLT 442*  LABPROT 14.0  INR 1.0  CREATININE 0.58    Estimated Creatinine Clearance: 41.2 mL/min (by C-G formula based on SCr of 0.58 mg/dL).   Medical History: Past Medical History:  Diagnosis Date   Anemia 01/18/2016   -reports on and off her whole life, on oral iron in the past remotely per report   Arthritis    B12 deficiency 01/18/2016   CAD (coronary artery disease)    CVD (cerebrovascular disease)    Diverticulosis of colon    H. pylori infection 10/20/2015   Hyperlipidemia    Hypertension    Osteoarthritis 03/07/2006   Qualifier: Diagnosis of  By: Tammie MD, Bruce     Assessment: 52 yoF presented with fever of unknown origin and new onset of afib w/ RVR. Pharmacy consulted to dose heparin  for afib.  -No PTA oral anticoagulation -Hgb 8.9s (bl~12), plts 442  Goal of Therapy:  Heparin  level 0.3-0.7 units/ml Monitor platelets by anticoagulation protocol: Yes   Plan:  -Will defer bolus given low Hgb -Start heparin  gtt at 850 units/hr -8h heparin  level -CBC and heparin  level daily -Monitor for signs/symptoms of bleeding  Lynwood Poplar, PharmD, BCPS Clinical Pharmacist 04/27/2024 10:44 PM        [1]  Allergies Allergen Reactions   Codeine Nausea And Vomiting and Nausea Only   Hydrocodone Nausea And Vomiting

## 2024-04-27 NOTE — ED Provider Notes (Signed)
 " [place] EMERGENCY DEPARTMENT AT [facility] Provider Note   CSN: [id number] Arrival date & time: 04/27/24  1659     Patient presents with: Fever and hand swelling   Felicia Cross is a 89 y.o. female.    Fever 89 year old female brought to the emergency department for evaluation of her right hand swelling and skin tear on her face. Patient has been living at [facility].  She has contracted upper and lower extremities.  She is bedbound at baseline. Daughter found her today with a swollen right wrist and hand.  She also was found to have redness and a skin tear along her left side cheek.  The staff at the rehab were unable to tell the daughter what happened and there are no reports as to what may have happened.  When the patient arrived here to the emergency department, she was found to be febrile with elevated heart rate and respiratory rate.  Past medical history of seizures, hyponatremia, anemia, malnutrition, and dementia     Prior to Admission medications  Medication Sig Start Date End Date Taking? Authorizing Provider  acetaminophen  (TYLENOL ) 325 MG tablet Take 975 mg by mouth See admin instructions. Take 3 tablets (975 mg) by mouth three times daily, may also take 2 tablets (650 mg) by mouth every 6 hours as needed for moderate pain/headache    [provider]  Blood Pressure Monitoring (BLOOD PRESSURE CUFF) MISC Use as directed for checking bp daily. 09/29/21   Mercer Clotilda SAUNDERS, MD  feeding supplement (ENSURE PLUS HIGH PROTEIN) LIQD Take 237 mLs by mouth 2 (two) times daily between meals. 02/23/24   Cleotilde Lukes, DO  leptospermum manuka honey (MEDIHONEY) PSTE paste Apply 1 Application topically daily. 02/23/24   Cleotilde Lukes, DO  [Paused] losartan  (COZAAR ) 100 MG tablet Take 1 tablet (100 mg total) by mouth daily. Wait to take this until your doctor or other care provider tells you to start again. 06/12/23   Mercer Clotilda SAUNDERS, MD   memantine  (NAMENDA ) 10 MG tablet TAKE 1 TABLET BY MOUTH TWICE DAILY**MUST KEEP UPCOMING APPOINTMENT WITH NP 07/24/2023** Patient taking differently: Take 10 mg by mouth 2 (two) times daily. 08/28/23   Millikan, Megan, NP  metoprolol  tartrate (LOPRESSOR ) 25 MG tablet Take 1 tablet (25 mg total) by mouth 2 (two) times daily. Patient taking differently: Take 25 mg by mouth daily. 06/12/23   Mercer Clotilda SAUNDERS, MD  Multiple Vitamin-Folic Acid  TABS Take 1 tablet by mouth every morning.    [provider]  [Paused] simvastatin  (ZOCOR ) 40 MG tablet Take 1 tablet (40 mg total) by mouth daily. Wait to take this until your doctor or other care provider tells you to start again. 06/12/23   Mercer Clotilda SAUNDERS, MD  [Paused] spironolactone  (ALDACTONE ) 25 MG tablet Take 1 tablet by mouth once daily Wait to take this until your doctor or other care provider tells you to start again. 06/12/23   Mercer Clotilda SAUNDERS, MD    Allergies: Codeine and Hydrocodone    Review of Systems  Constitutional:  Positive for fever.  All other systems reviewed and are negative.   Updated Vital Signs BP (!) 155/95   Pulse (!) 113   Temp (S) (!) 100.7 F (38.2 C) (Axillary)   Resp (!) 29   Ht 5' 4 (1.626 m)   Wt 62.6 kg   SpO2 100%   BMI 23.69 kg/m   Physical Exam Vitals and nursing note reviewed.  Constitutional:      General: She is in acute distress.     Comments: Moaning Elderly appearing  HENT:     Head:     Comments: Skin tear and redness along the left cheek    Mouth/Throat:     Mouth: Mucous membranes are dry.  Cardiovascular:     Rate and Rhythm: Tachycardia present. Rhythm irregular.     Pulses: Normal pulses.  Pulmonary:     Comments: Tachypnea Diminished breath sounds Abdominal:     Comments: No obvious guarding Soft Difficult to tell if she has tenderness due to her overall distress  Musculoskeletal:     Cervical back: Neck supple.     Comments: Upper extremity and lower extremity  contractures Inability to move these limbs No obvious deformities Right wrist/hand edema without obvious deformity  Skin:    General: Skin is warm.     Findings: No rash.  Neurological:     Mental Status: She is disoriented.     (all labs ordered are listed, but only abnormal results are displayed) Labs Reviewed  COMPREHENSIVE METABOLIC PANEL WITH GFR - Abnormal; Notable for the following components:      Result Value   Sodium 130 (*)    Chloride 93 (*)    Glucose, Bld 102 (*)    Total Protein 6.4 (*)    Albumin 3.0 (*)    AST 54 (*)    All other components within normal limits  CBC WITH DIFFERENTIAL/PLATELET - Abnormal; Notable for the following components:   WBC 14.3 (*)    RBC 3.25 (*)    Hemoglobin 8.9 (*)    HCT 28.9 (*)    RDW 15.7 (*)    Platelets 442 (*)    Neutro Abs 11.5 (*)    Monocytes Absolute 1.2 (*)    All other components within normal limits  CULTURE, BLOOD (ROUTINE X 2)  CULTURE, BLOOD (ROUTINE X 2)  RESP PANEL BY RT-PCR (RSV, FLU A&B, COVID)  RVPGX2  RESPIRATORY PANEL BY PCR  PROTIME-INR  URINALYSIS, W/ REFLEX TO CULTURE (INFECTION SUSPECTED)  I-STAT CG4 LACTIC ACID, ED  I-STAT CG4 LACTIC ACID, ED  I-STAT CG4 LACTIC ACID, ED    EKG: EKG Interpretation Date/Time:  Saturday April 27 2024 17:06:50 EST Ventricular Rate:  125 PR Interval:    QRS Duration:  75 QT Interval:  311 QTC Calculation: 425 R Axis:   -11  Text Interpretation: Atrial fibrillation Consider left ventricular hypertrophy Anterior Q waves, possibly due to LVH Minimal ST elevation, inferior leads Artifact in lead(s) I II III aVR aVL aVF V1 V3 V4 V6 Confirmed by Corinthia No [phone]) on 04/27/2024 6:43:30 PM  Radiology: ARCOLA Hand Complete Right Result Date: 04/27/2024 CLINICAL DATA:  Pain and swelling EXAM: DG HAND COMPLETE 3+V*R* COMPARISON:  12/06/2020 FINDINGS: Markedly limited by patient positioning/habitus and osseous overlap, reported contractures. No gross fracture is  seen. Soft tissue swelling at the wrist. No radiopaque foreign body. IMPRESSION: Markedly limited exam due to patient positioning/habitus and osseous overlap. No gross fracture is seen. Soft tissue swelling at the wrist. Electronically Signed   By: Luke Bun M.D.   On: 04/27/2024 18:38   DG Chest Portable 1 View Result Date: 04/27/2024 CLINICAL DATA:  Sepsis EXAM: PORTABLE CHEST 1 VIEW COMPARISON:  02/19/2024 FINDINGS: Lower chest is obscured by the patient's upper extremities. Unable to exclude airspace opacity at the right base. Grossly stable cardiomediastinal silhouette with aortic atherosclerosis. IMPRESSION: Limited exam due to patient's upper  extremities obscuring the lower chest. Unable to exclude airspace disease at the right base. Electronically Signed   By: Luke Bun M.D.   On: 04/27/2024 18:36     Procedures   Medications Ordered in the ED  lactated ringers  infusion ( Intravenous New Bag/Given 04/27/24 1822)  lactated ringers  bolus 1,000 mL (1,000 mLs Intravenous New Bag/Given 04/27/24 1824)  vancomycin  (VANCOCIN ) IVPB 1000 mg/200 mL premix (1,000 mg Intravenous New Bag/Given 04/27/24 1824)  ceFEPIme  (MAXIPIME ) 2 g in sodium chloride  0.9 % 100 mL IVPB (0 g Intravenous Stopped 04/27/24 1839)                                    Medical Decision Making 89 year old female brought to the emergency department for evaluation of her unexplained hand/wrist swelling and facial trauma.  Daughter at bedside is in tears stating she is bedbound and is unable to even roll around in bed.  There is no explanation for these injuries at the rehab facility.  Patient was found to be febrile at 100.7 degrees upon arrival.  Sepsis protocol was initiated due to her elevated heart rate and respiratory rate associated with the fever.  SpO2 was 100%. No known source but unclear the events that may have happened prior to arrival.  Sepsis protocol including blood cultures, labs, fluid bolus, urine,  and respiratory panels ordered.  I did just initiate 1 L of fluids due to her age and concerned about underlying heart failure/too much fluid given her clinical status.  Plan to add on more fluids as needed prior to starting her maintenance.  I did go ahead and give broad-spectrum antibiotics including cefepime  and vancomycin . I also added on head and facial CT to evaluate for underlying injury.  X-rays of the right hand and wrist ordered to evaluate for any underlying injury.  Patient's white count is elevated at 14.3.  Hemoglobin 8.9 and this appears to be her baseline.  Sodium 130.  Lactic within normal limits at 1.9.  Hand x-ray limited due to her contracted state but no acute fractures identified.  Chest x-ray also limited but no obvious focal consolidations seen.  Heart rate has improved with IV fluids.  I have held off on any medications for her A-fib due to improvement in the heart rate and response to IV fluids. Will plan on initiating admission for sepsis of unknown origin, A-fib RVR, leukocytosis, hyponatremia  Amount and/or Complexity of Data Reviewed Labs: ordered. Radiology: ordered.  Risk Prescription drug management. Decision regarding hospitalization.     Final diagnoses:  Sepsis, due to unspecified organism, unspecified whether acute organ dysfunction present (HCC)  Leukocytosis, unspecified type  Hyponatremia    ED Discharge Orders     None          Corinthia No, DO 04/27/24 1918  "

## 2024-04-27 NOTE — ED Notes (Signed)
 Phlebotomist to obtain 2nd blood culture

## 2024-04-27 NOTE — ED Notes (Signed)
 CCMD notified

## 2024-04-27 NOTE — ED Notes (Signed)
 Unable to obtain 2nd set of blood cultures. Will go ahead and administer antibiotics. Provider aware.

## 2024-04-27 NOTE — Assessment & Plan Note (Addendum)
 Patient met sepsis criteria upon arrival with fever, tachycardia, leukocytosis. Unclear source of infection at this time. CT C/A/P without acute abnormalities, UA unremarkable, normal lactic acid. Received Cefepime  and Vanc in ED and IVF. - Admit to FMTS with attending Dr Orie, Telemetry unit  - Continue Cefepime  and Vancomycin  per pharmacy - narrow as able - Add on procalcitonin  - Follow blood cultures (only first blood culture drawn prior to abx) - Continue LR IVF at maintenance rate 100 ml/hr  - NPO after failed bedside swallow, SLP consult placed  - Toradol  15mg  x1, Tylenol  suppository prn for pain while NPO - Vitals per floor  - AM CBC, CMP

## 2024-04-27 NOTE — Progress Notes (Signed)
 Pharmacy Antibiotic Note  Felicia Cross is a 89 y.o. female admitted on 04/27/2024 with fever and hand swelling.  Pharmacy has been consulted for cefepime /vancomycin  dosing for sepsis concerns of unknown source.  -Tmax 100.7, sCr ~bl, WBC 14 -One set of blood cultures collected; COVID/Flu negative -Sacral wound-no sign of infection -Imaging negative for sign of infections  Plan: -Cefepime  2g IV every 12 hours -Vancomycin  1g IV x1 -Vancomycin  750mg  IV every 24 hours (AUC 428, Vd 0.72, IBW, sCr 0.8) -Monitor renal function -Follow up signs of clinical improvement, LOT, de-escalation of antibiotics   Height: 5' 4 (162.6 cm) Weight: 62.6 kg (138 lb) IBW/kg (Calculated) : 54.7  Temp (24hrs), Avg:99.4 F (37.4 C), Min:98 F (36.7 C), Max:100.7 F (38.2 C)  Recent Labs  Lab 04/27/24 1727 04/27/24 1740  WBC 14.3*  --   CREATININE 0.58  --   LATICACIDVEN  --  1.9    Estimated Creatinine Clearance: 41.2 mL/min (by C-G formula based on SCr of 0.58 mg/dL).    Allergies[1]  Antimicrobials this admission: Cefepime  12/20 >>  Vancomycin  12/20 >>   Microbiology results: 12/20 BCx:   Thank you for allowing pharmacy to be a part of this patients care.  Lynwood Poplar, PharmD, BCPS Clinical Pharmacist 04/27/2024 11:55 PM       [1]  Allergies Allergen Reactions   Codeine Nausea And Vomiting and Nausea Only   Hydrocodone Nausea And Vomiting

## 2024-04-27 NOTE — H&P (Cosign Needed Addendum)
 "    Hospital Admission History and Physical Service Pager: [phone]  Patient name: Felicia Cross Medical record number: [id number] Date of Birth: 1932/10/05 Age: 89 y.o. Gender: female  Primary Care Provider: FORBES Nola Raisin, DO Consultants: Palliative Code Status: DNR which was confirmed by patient's HCPOA Preferred Emergency Contact:   Collene, Massimino [facility]) Daughter [phone]    Blalock,Mitzigayno Daughter [phone]     Chief Complaint: Fever, facial abrasion, R hand swelling   Differential and Medical Decision Making:  Felicia Cross is a 89 y.o. female presenting with fever, initially meeting sepsis criteria. Differential for this patient's presentation of this includes:   PNA: Possible given patient with high aspiration risk. CT Chest without acute findings at this time.  Viral etiology: Possible though RVP negative.  Sacral wound infection: Possible given chronic sacral wound though CT pelvis without acute focal bony or soft tissue findings.  UTI: Unlikely given unremarkable UA.  PE: Unlikely given CT Chest without evidence of PE.  Intra-abdominal process: Unlikely given no acute abnormality seen on CT.   Assessment & Plan Fever of unknown origin (FUO) Sepsis (HCC) Patient met sepsis criteria upon arrival with fever, tachycardia, leukocytosis. Unclear source of infection at this time. CT C/A/P without acute abnormalities, UA unremarkable, normal lactic acid. Received Cefepime  and Vanc in ED and IVF. - Admit to FMTS with attending Dr Orie, Telemetry unit  - Continue Cefepime  and Vancomycin  per pharmacy - narrow as able - Add on procalcitonin  - Follow blood cultures (only first blood culture drawn prior to abx) - Continue LR IVF at maintenance rate 100 ml/hr  - NPO after failed bedside swallow, SLP consult placed  - Toradol  15mg  x1, Tylenol  suppository prn for pain while NPO - Vitals per floor  - AM CBC, CMP Tachycardia Atrial fibrillation with rapid  ventricular response (HCC) Per chart review of outside records, patient with history of Afib and formerly on metoprolol . Per HCPOA, patient only taking morphine with dressing changes and tylenol  as needed, no other medications. Initial EKG with Afib with RVR though with artifact.  - Heparin  per pharm while patient NPO - Repeat EKG - Continuous cardiac monitoring  - IVF as above - Add on TSH, A1c Sacral wound Chronic sacral wound. No external signs of acute infection on exam. - CT C/A/P as above to further investigate for deeper infection  - Cefepime  and Vancomycin  per pharmacy as above  - Wound care consult placed   Abrasion of skin of face Superficial skin abrasion of L face, does not appear affected at this time. CT maxillofacial without acute fracture.  - Mupirocin  ointment BID  Swelling of right hand Minimal edema of R hand on exam, no signs of skin infection, equal bilateral radial pulses. Low concern for DVT at this time. XR of R hand without gross fracture.  - CTM  Chronic and Stable Conditions: Alzheimers Dementia: Suspect patient with overall deconditioning. Per discussion with HCPOA, appears outpatient conversations about GOC have begun. Palliative care consult placed.  Anemia: Hgb appears around baseline at this time, baseline Hgb appears around 9-10. CTM. Transfusion threshold Hgb<8 given hx CAD.  HTN: Hx of HTN. Per HCPOA, patient no longer on BP meds. CTM.  FEN/GI: NPO pending SLP eval VTE Prophylaxis: Heparin  per pharmacy for Afib while NPO  Disposition: Telemetry  History of Present Illness:  Felicia Cross is a 89 y.o. female presenting from SNF with facial abrasion and R hand swelling, found to be febrile and meeting sepsis criteria.  Daughter [name] identifies as HCPOA. Per daughter [name], she went to visit her mother at [facility] today when she found her mom had a wound on her L face and swollen R hand. Nobody at the SNF was able to explain what may have led  to this. Daughter notes that she visited her mother on Wednesday and none of these findings were present then. At baseline, patient with reduced appetite, only tolerating soft foods like purees and milkshakes. Patient followed by [facility] for primary care and they have started discussions of comfort care and hospice. At this time, HCPOA wishes to proceed with full medical care and interventions except for feeding tube.   In the ED, patient initially met sepsis criteria with fever of 100.7, tachycardia to 110-120, and leukocytosis 14. Normal lactic acid. Initial imaging of head, chest, and R hand unremarkable for acute findings. Patient given Cefepime , Vanc, and IVF.   Review Of Systems: Per HPI   Pertinent Past Medical History: Afib Alzheimers Anemia B12 deficiency CAD CVD Diverticulosis H pylori infection HLD HTN Osteoarthritis  Seizure hx   Pertinent Past Surgical History: Bilateral knee replacement L hip replacement   Abdominal hysterectomy Remainder reviewed in history tab.   Pertinent Social History: Tobacco use: No Alcohol  use: No Other Substance use: No Lives at [employer]  Pertinent Family History: Mother: Diabetes Father: HTN, kidney disease Sister: heart attack  Brother: heart attack .   Important Outpatient Medications: Per HCPOA, patient only medications include: -Morphine with wound changes -Tylenol  as needed  Objective: BP (!) 151/90   Pulse (!) 114   Temp (S) (!) 100.7 F (38.2 C) (Axillary)   Resp (!) 25   Ht 5' 4 (1.626 m)   Wt 62.6 kg   SpO2 100%   BMI 23.69 kg/m  Exam: General: No acute distress. Sleeping, awoken by voice.  Eyes: No scleral icterus or injection bilaterally.  Cardiovascular: Tachycardic S1/S2, somewhat irregular. No extra heart sounds.  Respiratory: No respiratory distress on RA. Anterior  Gastrointestinal: Abdomen soft, nontender, nondistended.  MSK: Contracted upper extremities. R hand with minimal swelling  of fingers, no erythema or skin breakdown. Bilateral radial pulses equal and intact. Normal cap refill.  Derm: Superficial abrasion of L cheek of face without active bleeding or drainage, non-infectious appearing. Large, deep sacral wound without active drainage or bleeding.  Neuro: Sleeping, awoken by voice. Unable to follow commands.         Labs:  CBC BMET  Recent Labs  Lab 04/27/24 1727  WBC 14.3*  HGB 8.9*  HCT 28.9*  PLT 442*   Recent Labs  Lab 04/27/24 1727  NA 130*  K 4.0  CL 93*  CO2 27  BUN 21  CREATININE 0.58  GLUCOSE 102*  CALCIUM 9.4    Lactic Acid, Venous    Component Value Date/Time   LATICACIDVEN 1.9 04/27/2024 1740    Urinalysis    Component Value Date/Time   COLORURINE YELLOW 04/27/2024 1848   APPEARANCEUR TURBID (A) 04/27/2024 1848   LABSPEC 1.010 04/27/2024 1848   PHURINE 6.0 04/27/2024 1848   GLUCOSEU NEGATIVE 04/27/2024 1848   GLUCOSEU NEGATIVE 11/02/2021 1546   HGBUR NEGATIVE 04/27/2024 1848   BILIRUBINUR NEGATIVE 04/27/2024 1848   BILIRUBINUR neg 01/06/2023 1407   KETONESUR NEGATIVE 04/27/2024 1848   PROTEINUR NEGATIVE 04/27/2024 1848   UROBILINOGEN 0.2 01/06/2023 1407   UROBILINOGEN 1.0 11/02/2021 1546   NITRITE NEGATIVE 04/27/2024 1848   LEUKOCYTESUR NEGATIVE 04/27/2024 1848   RPP  negative  EKG: Afib with RVR 125. Minimal ST changes of inferior leads.   Imaging Studies Performed: DG Hand Complete Right Result Date: 04/27/2024 IMPRESSION: Markedly limited exam due to patient positioning/habitus and osseous overlap. No gross fracture is seen. Soft tissue swelling at the wrist.   DG Chest Portable 1 View Result Date: 04/27/2024 IMPRESSION: Limited exam due to patient's upper extremities obscuring the lower chest. Unable to exclude airspace disease at the right base.   CT CHEST ABDOMEN PELVIS W CONTRAST Result Date: 04/27/2024 IMPRESSION: 1. No acute abnormality in the chest, abdomen, or pelvis.   CT Head Wo Contrast Result  Date: 04/27/2024 IMPRESSION: 1. No acute intracranial abnormality.  CT Maxillofacial Wo Contrast Result Date: 04/27/2024 IMPRESSION: 1. No acute facial fracture.    Diona Perkins, MD 04/27/2024, 7:06 PM PGY-2, [facility]  FPTS Intern pager: [phone], text pages welcome Secure chat group [facility] Teaching Service     "

## 2024-04-27 NOTE — Assessment & Plan Note (Signed)
 Minimal edema of R hand on exam, no signs of skin infection, equal bilateral radial pulses. Low concern for DVT at this time. XR of R hand without gross fracture.  - CTM

## 2024-04-27 NOTE — Assessment & Plan Note (Addendum)
 Per chart review of outside records, patient with history of Afib and formerly on metoprolol . Per HCPOA, patient only taking morphine with dressing changes and tylenol  as needed, no other medications. Initial EKG with Afib with RVR though with artifact.  - Heparin  per pharm while patient NPO - Repeat EKG - Continuous cardiac monitoring  - IVF as above - Add on TSH, A1c

## 2024-04-27 NOTE — ED Triage Notes (Signed)
 TO ed via [facility] from [facility]- Daughter states that her right hand and arm are more swollen than normal, nursing staff did not have further hx  to give EMS.   Feels warm to touch, axillary T on arrival 100.7 -- will follow up with rectal temp.   Is contracted - both upper extremities. And legs.  Diaper orrin on . Dry at this time

## 2024-04-28 DIAGNOSIS — I4891 Unspecified atrial fibrillation: Secondary | ICD-10-CM

## 2024-04-28 DIAGNOSIS — R509 Fever, unspecified: Secondary | ICD-10-CM | POA: Diagnosis not present

## 2024-04-28 DIAGNOSIS — F0390 Unspecified dementia without behavioral disturbance: Secondary | ICD-10-CM

## 2024-04-28 DIAGNOSIS — Z66 Do not resuscitate: Secondary | ICD-10-CM

## 2024-04-28 DIAGNOSIS — R000 Tachycardia, unspecified: Secondary | ICD-10-CM | POA: Diagnosis not present

## 2024-04-28 DIAGNOSIS — M7989 Other specified soft tissue disorders: Secondary | ICD-10-CM

## 2024-04-28 DIAGNOSIS — Z515 Encounter for palliative care: Secondary | ICD-10-CM

## 2024-04-28 DIAGNOSIS — A419 Sepsis, unspecified organism: Principal | ICD-10-CM

## 2024-04-28 DIAGNOSIS — Z7189 Other specified counseling: Secondary | ICD-10-CM

## 2024-04-28 LAB — DIC (DISSEMINATED INTRAVASCULAR COAGULATION)PANEL
D-Dimer, Quant: 3.07 ug{FEU}/mL — ABNORMAL HIGH (ref 0.00–0.50)
Fibrinogen: 542 mg/dL — ABNORMAL HIGH (ref 210–475)
INR: 1.1 (ref 0.8–1.2)
Platelets: 356 K/uL (ref 150–400)
Prothrombin Time: 15 s (ref 11.4–15.2)
Smear Review: NONE SEEN
aPTT: 41 s — ABNORMAL HIGH (ref 24–36)

## 2024-04-28 LAB — COMPREHENSIVE METABOLIC PANEL WITH GFR
ALT: 13 U/L (ref 0–44)
AST: 42 U/L — ABNORMAL HIGH (ref 15–41)
Albumin: 2.7 g/dL — ABNORMAL LOW (ref 3.5–5.0)
Alkaline Phosphatase: 78 U/L (ref 38–126)
Anion gap: 8 (ref 5–15)
BUN: 17 mg/dL (ref 8–23)
CO2: 25 mmol/L (ref 22–32)
Calcium: 8.5 mg/dL — ABNORMAL LOW (ref 8.9–10.3)
Chloride: 88 mmol/L — ABNORMAL LOW (ref 98–111)
Creatinine, Ser: 0.52 mg/dL (ref 0.44–1.00)
GFR, Estimated: >60 mL/min
Glucose, Bld: 106 mg/dL — ABNORMAL HIGH (ref 70–99)
Potassium: 3.8 mmol/L (ref 3.5–5.1)
Sodium: 121 mmol/L — ABNORMAL LOW (ref 135–145)
Total Bilirubin: 0.5 mg/dL (ref 0.0–1.2)
Total Protein: 5.6 g/dL — ABNORMAL LOW (ref 6.5–8.1)

## 2024-04-28 LAB — LACTATE DEHYDROGENASE: LDH: 294 U/L — ABNORMAL HIGH (ref 105–235)

## 2024-04-28 LAB — TROPONIN T, HIGH SENSITIVITY
Troponin T High Sensitivity: 109 ng/L (ref 0–19)
Troponin T High Sensitivity: 116 ng/L (ref 0–19)
Troponin T High Sensitivity: 118 ng/L (ref 0–19)

## 2024-04-28 LAB — I-STAT CG4 LACTIC ACID, ED: Lactic Acid, Venous: 1.7 mmol/L (ref 0.5–1.9)

## 2024-04-28 LAB — HEMOGLOBIN A1C
Hgb A1c MFr Bld: 5 % (ref 4.8–5.6)
Mean Plasma Glucose: 96.8 mg/dL

## 2024-04-28 LAB — CBC
HCT: 16 % — ABNORMAL LOW (ref 36.0–46.0)
Hemoglobin: 5.1 g/dL — CL (ref 12.0–15.0)
MCH: 28.3 pg (ref 26.0–34.0)
MCHC: 31.9 g/dL (ref 30.0–36.0)
MCV: 88.9 fL (ref 80.0–100.0)
Platelets: 430 K/uL — ABNORMAL HIGH (ref 150–400)
RBC: 1.8 MIL/uL — ABNORMAL LOW (ref 3.87–5.11)
RDW: 15.6 % — ABNORMAL HIGH (ref 11.5–15.5)
WBC: 14.8 K/uL — ABNORMAL HIGH (ref 4.0–10.5)
nRBC: 0 % (ref 0.0–0.2)

## 2024-04-28 LAB — URINALYSIS, ROUTINE W REFLEX MICROSCOPIC
Glucose, UA: NEGATIVE mg/dL
Ketones, ur: NEGATIVE mg/dL
Nitrite: NEGATIVE
Protein, ur: 100 mg/dL — AB
Specific Gravity, Urine: <1.005 — ABNORMAL LOW (ref 1.005–1.030)
pH: 7 (ref 5.0–8.0)

## 2024-04-28 LAB — URINALYSIS, MICROSCOPIC (REFLEX): RBC / HPF: >50 RBC/hpf (ref 0–5)

## 2024-04-28 LAB — OSMOLALITY: Osmolality: 259 mosm/kg — ABNORMAL LOW (ref 275–295)

## 2024-04-28 LAB — PROCALCITONIN: Procalcitonin: 0.18 ng/mL

## 2024-04-28 LAB — TSH: TSH: 2.66 u[IU]/mL (ref 0.350–4.500)

## 2024-04-28 LAB — PREPARE RBC (CROSSMATCH)

## 2024-04-28 MED ORDER — LORAZEPAM 2 MG/ML IJ SOLN: 2.0000 mg | INTRAMUSCULAR | Status: AC | PRN

## 2024-04-28 MED ORDER — ACETAMINOPHEN 650 MG RE SUPP: 650.0000 mg | Freq: Four times a day (QID) | RECTAL | Status: DC | PRN

## 2024-04-28 MED ORDER — METOPROLOL TARTRATE 5 MG/5ML IV SOLN
2.5000 mg | Freq: Once | INTRAVENOUS | Status: AC
  Administered 2024-04-28: 2.5 mg via INTRAVENOUS
  Filled 2024-04-28: qty 5

## 2024-04-28 MED ORDER — KETOROLAC TROMETHAMINE 15 MG/ML IJ SOLN
15.0000 mg | Freq: Once | INTRAMUSCULAR | Status: AC
  Administered 2024-04-28: 15 mg via INTRAVENOUS
  Filled 2024-04-28: qty 1

## 2024-04-28 MED ORDER — SODIUM CHLORIDE 0.9% IV SOLUTION: Freq: Once | INTRAVENOUS | Status: DC

## 2024-04-28 MED ORDER — LORAZEPAM 2 MG/ML IJ SOLN: 2.0000 mg | INTRAMUSCULAR | Status: DC | PRN

## 2024-04-28 MED ORDER — FENTANYL BOLUS VIA INFUSION: 100.0000 ug | INTRAVENOUS | Status: DC | PRN

## 2024-04-28 MED ORDER — POLYVINYL ALCOHOL 1.4 % OP SOLN: 1.0000 [drp] | Freq: Four times a day (QID) | OPHTHALMIC | 0 refills | Status: AC | PRN

## 2024-04-28 MED ORDER — POLYVINYL ALCOHOL 1.4 % OP SOLN: 1.0000 [drp] | Freq: Four times a day (QID) | OPHTHALMIC | Status: DC | PRN

## 2024-04-28 MED ORDER — GLYCOPYRROLATE 1 MG PO TABS: 1.0000 mg | ORAL_TABLET | ORAL | Status: DC | PRN

## 2024-04-28 MED ORDER — ACETAMINOPHEN 325 MG PO TABS: 650.0000 mg | ORAL_TABLET | Freq: Four times a day (QID) | ORAL | Status: DC | PRN

## 2024-04-28 MED ORDER — SODIUM CHLORIDE 0.9 % IV SOLN: INTRAVENOUS | Status: DC

## 2024-04-28 MED ORDER — GLYCOPYRROLATE 0.2 MG/ML IJ SOLN: 0.2000 mg | INTRAMUSCULAR | Status: DC | PRN

## 2024-04-28 MED ORDER — FENTANYL 2500MCG IN NS 250ML (10MCG/ML) PREMIX INFUSION
0.0000 ug/h | INTRAVENOUS | Status: DC
  Administered 2024-04-28: 50 ug/h via INTRAVENOUS
  Filled 2024-04-28: qty 250

## 2024-04-28 MED ORDER — GLYCOPYRROLATE 1 MG PO TABS: 1.0000 mg | ORAL_TABLET | ORAL | Status: AC | PRN

## 2024-04-28 NOTE — Plan of Care (Addendum)
 Given no clear source of infection or explanation for tachycardia and fever, troponins were collected. Troponins elevated 109 > 118. Repeat EKG without ST changes. Suspect supply-demand ischemia 2/2 tachyarrhythmia and sepsis. Will trend troponins. Patient on heparin  gtt. Given continued tachycardia, will give IV metoprolol  2.5 mg x1 and redose IV toradol  for pain.

## 2024-04-28 NOTE — TOC Transition Note (Signed)
 Transition of Care [facility]) - Discharge Note   Patient Details  Name: Felicia Cross MRN: [id number] Date of Birth: May 13, 1932  Transition [facility] Care [facility]) CM/SW Contact:  Corean JAYSON Canary, RN Phone Number: 04/28/2024, 5:02 PM   Clinical Narrative:     Patient will transition to [facility]. Report has been called and [facility] has been called for transport Team alerted that they will be there soon to pick patient up  Final next level of care: Hospice Medical Facility Barriers to Discharge: No Barriers Identified   Patient Goals and CMS Choice            Discharge Placement                       Discharge Plan and Services Additional resources added to the After Visit Summary for                                       Social Drivers of Health (SDOH) Interventions SDOH Screenings   Food Insecurity: No Food Insecurity (02/16/2024)  Housing: Low Risk (02/16/2024)  Transportation Needs: No Transportation Needs (02/16/2024)  Utilities: Not At Risk (02/16/2024)  Depression (PHQ2-9): Low Risk (01/06/2023)  Financial Resource Strain: Low Risk (12/13/2021)  Physical Activity: Inactive (12/13/2021)  Social Connections: Moderately Integrated (02/16/2024)  Stress: No Stress Concern Present (12/13/2021)  Tobacco Use: Medium Risk (04/27/2024)     Readmission Risk Interventions    02/23/2024   11:32 AM  Readmission Risk Prevention Plan  Transportation Screening Complete  PCP or Specialist Appt within 5-7 Days Complete  Home Care Screening Complete  Medication Review (RN CM) Referral to Pharmacy

## 2024-04-28 NOTE — Consult Note (Signed)
 " Palliative Medicine Inpatient Consult Note  Consulting Provider:   Diona Perkins, MD    Reason for consult:   Palliative Care Consult Services Palliative Medicine Consult  Reason for Consult? Comfort care initiated   04/28/2024  HPI:  Per intake H&P -->  89 year old female with a PMH of seizures, hyponatremia, anemia, malnutrition, and dementia brought to the emergency department for evaluation of her right hand swelling and skin tear on her face.  Alysha has a very poor baseline level of functioning due to advanced dementia. She has been identified to be in the active process of dying therefore the palliative medicine team has been asked to support end of life care.   Clinical Assessment/Goals of Care:  *Please note that this is a verbal dictation therefore any spelling or grammatical errors are due to the Dragon Medical One system interpretation.  I have reviewed medical records including EPIC notes, labs and imaging, received report from bedside RN, assessed the patient who is lying in bed somnolent.    I met with patient's daughter, [name] to further discuss diagnosis prognosis, GOC, EOL wishes, disposition and options.   I introduced Palliative Medicine as specialized medical care for people living with serious illness. It focuses on providing relief from the symptoms and stress of a serious illness. The goal is to improve quality of life for both the patient and the family.  Medical History Review and Understanding:  A review of Aun past medical history significant for hyponatremia, anemia, dementia, and seizures was completed.  Social History:  Jinna lives in [place], [place] .  She has been married for the past 65 years to her spouse, Tilford.  She has 4 children, 4 grandchildren, and 6 great-grandchildren. Tyjae worked throughout her career at the [employer] building.  She is a woman who used to enjoy traveling tremendously and being an  active member of [employer].  Functional and Nutritional State:  Preceding hospitalization, Maykayla was total care for all bADLs in the setting of her advanced dementia.  She had had a declining appetite overall.  Advance Directives:  A detailed discussion was had today regarding advanced directives.  Patients daughter, [name] shares that she and her sister, [name] are the HCPOA's which is confirmed in her AD's.   Code Status:  Concepts specific to code status, artifical feeding and hydration, continued IV antibiotics and rehospitalization was had.  The difference between a aggressive medical intervention path  and a palliative comfort care path for this patient at this time was had.   Aadhira is an established DNAR/DNI code status which has been confirmed with [name].   Discussion:  The reason for hospitalization was reviewed inclusive of patient's fever and sepsis.  We reviewed the potential ideologies of infection inclusive of pneumonia, sacral wound, and possible UTI.  We reviewed patient had been treated with broad-spectrum antibiotics upon admission though patient's daughter, [name] had reviewed what her wishes are and determine enough is enough.  She shares with me that per conversation with her sisters her mom has been through enough and they no longer want her to be rehospitalized or go through aggressive diagnostic workup.  Patient's family realized that she is going to need her internal father.  A review of what comfort care looks like was held. We talked about transition to comfort measures in house and what that would entail inclusive of medications to control pain, dyspnea, agitation, nausea, itching, and hiccups.  We discussed stopping all uneccessary measures  such as cardiac monitoring, blood draws, needle sticks, and frequent vital signs.   Utilized reflective listening throughout our time together.   Patient's family is interested in [facility]  home.  I shared that I would discuss this further with the transitions of care team.  Discussed the importance of continued conversation with family and their  medical providers regarding overall plan of care and treatment options, ensuring decisions are within the context of the patients values and GOCs.  Decision Maker: [name] Merritts (daughter): [phone]  SUMMARY OF RECOMMENDATIONS   DNAR/DNI  Comfort care  Patient currently on a fentanyl  drip however if transitions to [facility] can go on Dilaudid  1 mg every 4 hours around-the-clock in replacement of drip  Additional comfort medications per [facility]  Education provided on end-of-life care and gone from my sight book provided  Appreciate transitions of care team speaking to [facility] liaison for placement at [facility]  Ongoing palliative care support  Code Status/Advance Care Planning: DNAR/DNI  Palliative Prophylaxis:  Aspiration, Bowel Regimen, Delirium Protocol, Frequent Pain Assessment, Oral Care, Palliative Wound Care, and Turn Reposition  Additional Recommendations (Limitations, Scope, Preferences): Continue current care  Psycho-social/Spiritual:  Desire for further Chaplaincy support: Yes Additional Recommendations: Education on end-of-life care   Prognosis: Limited overall  Discharge Planning: Discharge plan to be determined  Vitals:   04/28/24 0405 04/28/24 0625  BP:    Pulse:    Resp:    Temp: 98.7 F (37.1 C) (!) 97 F (36.1 C)  SpO2:      Intake/Output Summary (Last 24 hours) at 04/28/2024 0708 Last data filed at 04/27/2024 1924 Gross per 24 hour  Intake 200 ml  Output --  Net 200 ml   Last Weight  Most recent update: 04/27/2024  5:09 PM    Weight  62.6 kg (138 lb)             LABS: CBC:    Component Value Date/Time   WBC 14.8 (H) 04/28/2024 0340   HGB 5.1 (LL) 04/28/2024 0340   HCT 16.0 (L) 04/28/2024 0340   PLT 356 04/28/2024 0446   MCV 88.9 04/28/2024 0340   NEUTROABS  11.5 (H) 04/27/2024 1727   LYMPHSABS 1.5 04/27/2024 1727   MONOABS 1.2 (H) 04/27/2024 1727   EOSABS 0.1 04/27/2024 1727   BASOSABS 0.1 04/27/2024 1727   Comprehensive Metabolic Panel:    Component Value Date/Time   NA 121 (L) 04/28/2024 0215   NA 129 (L) 12/01/2021 1419   K 3.8 04/28/2024 0215   CL 88 (L) 04/28/2024 0215   CO2 25 04/28/2024 0215   BUN 17 04/28/2024 0215   BUN 12 12/01/2021 1419   CREATININE 0.52 04/28/2024 0215   CREATININE 0.90 11/05/2021 1513   GLUCOSE 106 (H) 04/28/2024 0215   CALCIUM 8.5 (L) 04/28/2024 0215   AST 42 (H) 04/28/2024 0215   ALT 13 04/28/2024 0215   ALKPHOS 78 04/28/2024 0215   BILITOT 0.5 04/28/2024 0215   BILITOT 0.3 11/05/2018 1233   PROT 5.6 (L) 04/28/2024 0215   PROT 6.5 11/05/2018 1233   ALBUMIN 2.7 (L) 04/28/2024 0215   ALBUMIN 4.1 11/05/2018 1233    Gen: Elderly chronically ill-appearing African-American female HEENT: Dry mucous membranes CV: Regular rate and rhythm  PULM: On room air breathing is even and unlabored ABD: soft/nontender  EXT: Muscle wasting notable in all 4 extremities with contractures Neuro: Somnolent  PPS: 10%   This conversation/these recommendations were discussed with patient primary care  team, Dr. Orie ______________________________________________________ [name] [facility] Palliative Medicine Team Team Cell Phone: [phone] Please utilize secure chat with additional questions, if there is no response within 30 minutes please call the above phone number  Total Time: 75 Billing based on MDM: High  Palliative Medicine Team providers are available by phone from 7am to 7pm daily and can be reached through the team cell phone.  Should this patient require assistance outside of these hours, please call the patient's attending physician.  "

## 2024-04-28 NOTE — ED Notes (Signed)
 Attempted to give pt water and apple sauce.  PT opened eyes to voice. Breathing even ad unlabored.

## 2024-04-28 NOTE — ED Notes (Signed)
 PT on the way up to the floor. Breathing is even and unlabored. Floor notified pt is on the way.

## 2024-04-28 NOTE — Discharge Summary (Cosign Needed)
 "  Family Medicine Teaching [facility] Discharge Summary  Patient name: Felicia Cross Medical record number: [id number] Date of birth: February 27, 1933 Age: 89 y.o. Gender: female Date of Admission: 04/27/2024  Date of Discharge: 04/28/24 Admitting Physician: Milda LITTIE Deed, MD  Primary Care Provider: FORBES Nola Raisin, DO Consultants: Palliative care  Indication for Hospitalization: Sepsis  Discharge Diagnoses/Problem List:   Principal Problem:   Fever of unknown origin (FUO) Active Problems:   Tachycardia   Sepsis (HCC)   Atrial fibrillation with rapid ventricular response (HCC)   Sacral wound   Abrasion of skin of face   Swelling of right hand  Brief Hospital Course:  This is a an 89 year old female with PMH of dementia, anemia, hypertension who was admitted to the [facility] family medicine teaching service with fever of unknown origin, meeting sepsis criteria.  Her hospital course is detailed below:  Fever of unknown origin Patient's daughter noted facial abrasion and right hand swelling at SNF, uncertain what the cause was, so she brought her mother to the ED where she was found to be febrile and meeting septic criteria due to fever, tachycardia, leukocytosis.  CT chest/abdomen/pelvis and CT head revealing of infection source.  Chest x-ray and right hand x-ray difficult to fully assess due to patient positioning however no clear bony injury or source of infection.  RVP negative.  Patient given empiric cefepime  and vancomycin , IV fluids, pain control.  Troponins elevated, suspected due to demand ischemia in the setting of tachycardia and sepsis.  EKG without ST changes, plan to trend troponin and patient on heparin  drip.  Critical hemoglobin of 5.1 and sodium of 121.  Discussed with daughter, healthcare POA, at bedside role of blood transfusion and plan for further workup.  Given overall prognosis and recommended medical interventions, HCPOA discussed with family and elected to  transition to comfort care at this time.  Palliative care consulted, and patient discharged to [facility] on 04/28/24.    Results/Tests Pending at Time of Discharge:  None  Disposition: [facility]  Discharge Condition: hospice  Discharge Exam:  Vitals:   04/28/24 1201 04/28/24 1350  BP: (!) 111/59 (!) 157/74  Pulse: 96 (!) 107  Resp: 10 14  Temp: 98.1 F (36.7 C) 98.1 F (36.7 C)  SpO2: 100% 99%   General: ill appearing frail female lying in bed in flexor positioning Cardiovascular: irregular rhythm but regular rate on exam Respiratory: ctab, no increased WOB Abdomen: soft, no guarding   Significant Procedures:  None  Significant Labs and Imaging:  Recent Labs  Lab 04/27/24 1727 04/28/24 0340 04/28/24 0446  WBC 14.3* 14.8*  --   HGB 8.9* 5.1*  --   HCT 28.9* 16.0*  --   PLT 442* 430* 356   Recent Labs  Lab 04/27/24 1727 04/28/24 0215  NA 130* 121*  K 4.0 3.8  CL 93* 88*  CO2 27 25  GLUCOSE 102* 106*  BUN 21 17  CREATININE 0.58 0.52  CALCIUM 9.4 8.5*  ALKPHOS 79 78  AST 54* 42*  ALT 18 13  ALBUMIN 3.0* 2.7*    CT CHEST ABDOMEN PELVIS W CONTRAST Result Date: 04/27/2024 EXAM: CT CHEST, ABDOMEN AND PELVIS WITH CONTRAST 04/27/2024 09:49:45 PM TECHNIQUE: CT of the chest, abdomen and pelvis was performed with the administration of 75 mL of iohexol  (OMNIPAQUE ) 350 MG/ML intravenous contrast. Multiplanar reformatted images are provided for review. Automated exposure control, iterative reconstruction, and/or weight based adjustment of the mA/kV was utilized to reduce the  radiation dose to as low as reasonably achievable. COMPARISON: 02/16/2024 CLINICAL HISTORY: Sepsis. FINDINGS: CHEST: MEDIASTINUM AND LYMPH NODES: Heart is enlarged in size. Pericardium is unremarkable. The central airways are clear. The pulmonary artery is visualized and is within normal limits without emboli. The thoracic inlet is within normal limits. No mediastinal, hilar or axillary  lymphadenopathy. The esophagus is unremarkable. LUNGS AND PLEURA: Lungs are well aerated bilaterally. No focal infiltrate or sizable effusion is seen. Mild emphysematous changes are noted. No pneumothorax. ABDOMEN AND PELVIS: LIVER: The liver is unremarkable. GALLBLADDER AND BILE DUCTS: Gallbladder is unremarkable. No biliary ductal dilatation. SPLEEN: No acute abnormality. PANCREAS: No acute abnormality. ADRENAL GLANDS: No acute abnormality. KIDNEYS, URETERS AND BLADDER: Left renal cyst is noted similar to prior exam. No follow-up is recommended. No stones in the kidneys or ureters. No hydronephrosis or obstructive changes are seen. The bladder is decompressed by a foley catheter. GI AND BOWEL: Stomach is within normal limits. Small bowel is within normal limits. Scattered diverticular change of the colon is noted without diverticulitis. The appendix is not well seen although no inflammatory changes are noted to suggest appendicitis. There is no bowel obstruction. REPRODUCTIVE ORGANS: The uterus has been surgically removed. No acute abnormality. PERITONEUM AND RETROPERITONEUM: No free air. No free fluid is noted within the pelvis. VASCULATURE: The aorta shows atherosclerotic calcifications. No aneurysmal dilatation or dissection is noted. ABDOMINAL AND PELVIS LYMPH NODES: No lymphadenopathy. BONES AND SOFT TISSUES: Degenerative changes of the thoracic spine are seen. Degenerative changes of the lumbar spine are noted. Postsurgical changes in the right hip are seen. No focal soft tissue abnormality. IMPRESSION: 1. No acute abnormality in the chest, abdomen, or pelvis. Electronically signed by: Oneil Devonshire MD 04/27/2024 10:04 PM EST RP Workstation: [id number]   CT Head Wo Contrast Result Date: 04/27/2024 EXAM: CT HEAD WITHOUT CONTRAST 04/27/2024 07:31:35 PM TECHNIQUE: CT of the head was performed without the administration of intravenous contrast. Automated exposure control, iterative reconstruction, and/or  weight based adjustment of the mA/kV was utilized to reduce the radiation dose to as low as reasonably achievable. COMPARISON: None available. CLINICAL HISTORY: Unexplained facial trauma. FINDINGS: BRAIN AND VENTRICLES: No acute hemorrhage. No evidence of acute infarct. Generalized cerebral volume loss with ex vacuo dilatation of the ventricles. Chronic white matter ischemic change. No extra-axial collection. No mass effect or midline shift. ORBITS: No acute abnormality. SINUSES: No acute abnormality. SOFT TISSUES AND SKULL: No acute soft tissue abnormality. No skull fracture. IMPRESSION: 1. No acute intracranial abnormality . Electronically signed by: Norman Gatlin MD 04/27/2024 07:50 PM EST RP Workstation: [id number]   CT Maxillofacial Wo Contrast Result Date: 04/27/2024 EXAM: CT OF THE FACE WITHOUT CONTRAST 04/27/2024 07:31:35 PM TECHNIQUE: CT of the face was performed without the administration of intravenous contrast. Multiplanar reformatted images are provided for review. Automated exposure control, iterative reconstruction, and/or weight based adjustment of the mA/kV was utilized to reduce the radiation dose to as low as reasonably achievable. COMPARISON: None available. CLINICAL HISTORY: Unexplained facial trauma FINDINGS: FACIAL BONES: No acute facial fracture. No mandibular dislocation. No suspicious bone lesion. ORBITS: Globes are intact. No acute traumatic injury. No inflammatory change. SINUSES AND MASTOIDS: No acute abnormality. SOFT TISSUES: No acute abnormality. IMPRESSION: 1. No acute facial fracture. Electronically signed by: Norman Gatlin MD 04/27/2024 07:39 PM EST RP Workstation: [id number]   DG Hand Complete Right Result Date: 04/27/2024 CLINICAL DATA:  Pain and swelling EXAM: DG HAND COMPLETE 3+V*R* COMPARISON:  12/06/2020 FINDINGS: Markedly limited by patient positioning/habitus and  osseous overlap, reported contractures. No gross fracture is seen. Soft tissue swelling at the wrist.  No radiopaque foreign body. IMPRESSION: Markedly limited exam due to patient positioning/habitus and osseous overlap. No gross fracture is seen. Soft tissue swelling at the wrist. Electronically Signed   By: Luke Bun M.D.   On: 04/27/2024 18:38   DG Chest Portable 1 View Result Date: 04/27/2024 CLINICAL DATA:  Sepsis EXAM: PORTABLE CHEST 1 VIEW COMPARISON:  02/19/2024 FINDINGS: Lower chest is obscured by the patient's upper extremities. Unable to exclude airspace opacity at the right base. Grossly stable cardiomediastinal silhouette with aortic atherosclerosis. IMPRESSION: Limited exam due to patient's upper extremities obscuring the lower chest. Unable to exclude airspace disease at the right base. Electronically Signed   By: Luke Bun M.D.   On: 04/27/2024 18:36      Discharge Medications:  Allergies as of 04/28/2024       Reactions   Codeine Nausea And Vomiting, Nausea Only   Hydrocodone Nausea And Vomiting   Not listed on the [facility]        Medication List     STOP taking these medications    Blood Pressure Cuff Misc   Decubi-Vite Caps   feeding supplement Liqd   Pro-Stat AWC Liqd       TAKE these medications    Acetaminophen  325 MG/10.15ML Soln Take 30 mLs by mouth in the morning and at bedtime.   artificial tears ophthalmic solution Place 1 drop into both eyes 4 (four) times daily as needed for dry eyes.   bisacodyl  10 MG suppository Commonly known as: DULCOLAX Place 10 mg rectally daily as needed for moderate constipation.   ENEMA RE Place 1 enema rectally daily as needed (for constipation).   glycopyrrolate  1 MG tablet Commonly known as: ROBINUL  Take 1 tablet (1 mg total) by mouth every 4 (four) hours as needed (excessive secretions).   LORazepam  2 MG/ML injection Commonly known as: ATIVAN  Inject 1-2 mLs (2-4 mg total) into the vein every 4 (four) hours as needed for anxiety or seizure.   magnesium  hydroxide 400 MG/5ML suspension Commonly known  as: MILK OF MAGNESIA Take 30 mLs by mouth daily as needed for moderate constipation.   morphine 20 MG/ML concentrated solution Commonly known as: ROXANOL Take 2.5 mLs by mouth daily as needed for severe pain (pain score 7-10). Give 0.148mL 1 hour before wound dressing changes   ondansetron  4 MG tablet Commonly known as: ZOFRAN  Take 4 mg by mouth every 6 (six) hours as needed for nausea or vomiting.        Discharge Instructions: Please refer to Patient Instructions section of EMR for full details.  Patient was counseled important signs and symptoms that should prompt return to medical care, changes in medications, dietary instructions, activity restrictions, and follow up appointments.   Follow-Up Appointments: As determined by hospice care  Alena Morrison, Christien Berthelot, MD 04/28/2024, 3:53 PM PGY-1, [facility] Family Medicine  "

## 2024-04-28 NOTE — Care Management (Signed)
 Consult for residential hospice. Awaiting on liaison from [facility],

## 2024-04-28 NOTE — Progress Notes (Signed)
 Called [facility] to give report at [phone].  RN unable to take report at this time.  She will call back when available.

## 2024-04-28 NOTE — Assessment & Plan Note (Signed)
 Palliative care consulted, appreciate recs Please see MAR for full list of medications/orders for comfort measures Fentanyl  infusion for pain given allergies to codeine/hydrocodone

## 2024-04-28 NOTE — ED Notes (Signed)
 Daughter updated at this time on floor and room number.

## 2024-04-28 NOTE — Progress Notes (Signed)
 [facility] [phone]  [facility] Liaison Note   Referral received from [facility] for family interest in [facility]   Met with patient and spoke with daughter [name] via phone to explain services and hospice philosophy and all questions answered.  [facility] is able to accept patient this afternoon once consents are complete.    RN staff, you may call report at any time to [facility] @ [phone], room is assigned when report is called.  Please leave IV intact and send completed DNR with patient.   Updated attending and  Hospital Team and ICM via Epic chat.  Thank you for the opportunity to participate in this patient's care.  Nat Babe, BSN, RN Hospice Nurse Liaison [phone]

## 2024-04-28 NOTE — ED Notes (Signed)
 PT breathing is even and unlabored. Support person at bedside.

## 2024-04-28 NOTE — Progress Notes (Signed)
" ° ° ° °  Daily Progress Note Intern Pager: [phone]  Patient name: Felicia Cross Medical record number: [id number] Date of birth: [birth year] Age: 89 y.o. Gender: female  Primary Care Provider: FORBES Nola Raisin, DO Consultants: palliative Code Status: DNR  Pt Overview and Major Events to Date:  12/20 - admitted 12/21 - transitioned to comfort care  Assessment and Plan:  This is an 89 yo female with PMH dementia, anemia, HTN who was admitted with sepsis, developed worsening anemia, and is now pursuing comfort measures.  Assessment & Plan Fever of unknown origin (FUO) Sepsis (HCC) Tachycardia Atrial fibrillation with rapid ventricular response (HCC) Sacral wound Abrasion of skin of face Swelling of right hand Palliative care consulted, appreciate recs Please see MAR for full list of medications/orders for comfort measures Fentanyl  infusion for pain given allergies to codeine/hydrocodone   FEN/GI: changed to regular diet in line with comfort measures PPx: not indicated Dispo: hopefully for hospice placement at [facility].   Subjective:  Felicia Cross, daughter, at bedside and reports her mother is comfortable now receiving fentanyl . Was coughing a lot over night, but seems better now. Felicia Cross spoke with palliative care this morning and they will work on placement.   Objective: Temp:  [97 F (36.1 C)-100.7 F (38.2 C)] 97 F (36.1 C) (12/21 0625) Pulse Rate:  [101-126] 101 (12/21 0341) Resp:  [25-35] 34 (12/21 0341) BP: (125-187)/(75-95) 129/90 (12/21 0341) SpO2:  [100 %] 100 % (12/21 0341) Weight:  [62.6 kg] 62.6 kg (12/20 1708) Physical Exam: General: ill appearing frail female lying in bed in flexor psoitioning Cardiovascular: irregular rhythm but regular rate on exam Respiratory: ctab, no increased WOB Abdomen: soft, no guarding    Laboratory: None  Imaging/Diagnostic Tests: None  Alena Morrison, Sargun Rummell, MD 04/28/2024, 7:31 AM  PGY-1, [facility] FPTS Intern pager: [phone], text pages welcome Secure chat group [facility] Teaching Service   "

## 2024-04-28 NOTE — Plan of Care (Addendum)
 Notified by nursing of critical Hgb 5.1. Type and screen ordered. D/c'ed heparin . Will discuss with HCPOA regarding blood transfusion, who should be arriving to bedside soon. On morning labs, Na decreased 130 > 121. Reduced mIVF to 0.75 maintenance rate at 6ml/hr LR.   At bedside, patient resting without acute distress. HR 90-100, satting well on room air. On exam, intermittent tachycardia with irregular rhythm, no respiratory distress, no bleeding from IV site, bloody urine noted in catheter bag, warm extremities. LDH, DIC pane, serum osm added on. Repeat UA ordered.   Addendum 78 Called and spoke with patient's daughter [name] [facility]) regarding Hgb change this morning and possibility of blood transfusion. Daughter is in the waiting room and would like to see patient prior to making decision. Will await her at bedside.

## 2024-04-28 NOTE — ED Notes (Signed)
 Meal tray ordered

## 2024-04-28 NOTE — ED Notes (Signed)
 PT brief changed at this time. Small BM. PT resting on right side with pillow between legs. Bed sore dressing replaced.

## 2024-04-28 NOTE — Progress Notes (Signed)
 Report given to [facility]. IV fluids D/C. Patient discharged with IV's and foley catheter.SABRA

## 2024-04-28 NOTE — Plan of Care (Addendum)
 Met with patient's daughter Felicia Cross [facility]) at patient bedside. Discussed decline in patient Hgb and recommendation for blood transfusion if seeking full medical interventions. Daughter inquired about overall prognosis. Discussed treatment thus far and discussed overall patient prognosis and how patient appears to be in transition period at end-of-life. Dicussed possibility to maximize comfort and prevent prolongation of suffering through comfort care measures. Patient's daughter spoke with her sibling over the phone regarding decision to transition to comfort care. Patient's daughter Felicia verbalized decision to proceed with comfort care measures at this time. Comfort care orders placed and other orders adjusted to reflect comfort status. Changed level of care to palliative.

## 2024-05-02 LAB — TYPE AND SCREEN
ABO/RH(D): A POS
Antibody Screen: NEGATIVE
Unit division: 0
Unit division: 0

## 2024-05-02 LAB — CULTURE, BLOOD (ROUTINE X 2): Culture: NO GROWTH

## 2024-05-02 LAB — BPAM RBC
Blood Product Expiration Date: 202601152359
Blood Product Expiration Date: 202601152359
Unit Type and Rh: 6200
Unit Type and Rh: 6200

## 2024-05-03 LAB — CULTURE, BLOOD (ROUTINE X 2): Culture: NO GROWTH

## 2024-05-09 DEATH — deceased
